# Patient Record
Sex: Female | Born: 1955 | Race: Black or African American | Hispanic: No | Smoking: Never smoker
Health system: Southern US, Community
[De-identification: ages and names within clinical notes are randomized; demographics above are authoritative.]

## PROBLEM LIST (undated history)

## (undated) DIAGNOSIS — E785 Hyperlipidemia, unspecified: Secondary | ICD-10-CM

## (undated) DIAGNOSIS — F32A Depression, unspecified: Secondary | ICD-10-CM

## (undated) DIAGNOSIS — R079 Chest pain, unspecified: Secondary | ICD-10-CM

## (undated) DIAGNOSIS — M1990 Unspecified osteoarthritis, unspecified site: Secondary | ICD-10-CM

## (undated) DIAGNOSIS — R011 Cardiac murmur, unspecified: Secondary | ICD-10-CM

## (undated) DIAGNOSIS — I10 Essential (primary) hypertension: Secondary | ICD-10-CM

## (undated) DIAGNOSIS — F419 Anxiety disorder, unspecified: Secondary | ICD-10-CM

## (undated) DIAGNOSIS — G5603 Carpal tunnel syndrome, bilateral upper limbs: Secondary | ICD-10-CM

## (undated) DIAGNOSIS — T7840XA Allergy, unspecified, initial encounter: Secondary | ICD-10-CM

## (undated) DIAGNOSIS — M8580 Other specified disorders of bone density and structure, unspecified site: Secondary | ICD-10-CM

## (undated) DIAGNOSIS — IMO0001 Reserved for inherently not codable concepts without codable children: Secondary | ICD-10-CM

## (undated) DIAGNOSIS — J45909 Unspecified asthma, uncomplicated: Secondary | ICD-10-CM

## (undated) HISTORY — PX: POLYPECTOMY: SHX149

## (undated) HISTORY — DX: Depression, unspecified: F32.A

## (undated) HISTORY — DX: Reserved for inherently not codable concepts without codable children: IMO0001

## (undated) HISTORY — DX: Essential (primary) hypertension: I10

## (undated) HISTORY — DX: Hyperlipidemia, unspecified: E78.5

## (undated) HISTORY — DX: Cardiac murmur, unspecified: R01.1

## (undated) HISTORY — DX: Allergy, unspecified, initial encounter: T78.40XA

## (undated) HISTORY — PX: COLONOSCOPY: SHX174

## (undated) HISTORY — DX: Chest pain, unspecified: R07.9

## (undated) HISTORY — DX: Other specified disorders of bone density and structure, unspecified site: M85.80

## (undated) NOTE — *Deleted (*Deleted)
It was great to see you! Thank you for allowing me to participate in your care!  Our plans for today:  -Today we discussed your dizziness and headache. *** -   We are checking some labs today, I will call you if they are abnormal will send you a MyChart message or a letter if they are normal.  If you do not hear about your labs in the next 2 weeks please let us know.***  Take care and seek immediate care sooner if you develop any concerns.   Dr. Jackelyn Poling, DO [facility]

---

## 1999-06-29 ENCOUNTER — Other Ambulatory Visit: Admission: RE | Admit: 1999-06-29 | Discharge: 1999-06-29 | Payer: Self-pay | Admitting: Family Medicine

## 2000-07-13 ENCOUNTER — Other Ambulatory Visit: Admission: RE | Admit: 2000-07-13 | Discharge: 2000-07-13 | Payer: Self-pay | Admitting: Family Medicine

## 2000-10-28 ENCOUNTER — Emergency Department (HOSPITAL_COMMUNITY): Admission: EM | Admit: 2000-10-28 | Discharge: 2000-10-28 | Payer: Self-pay | Admitting: Emergency Medicine

## 2002-01-29 ENCOUNTER — Encounter: Payer: Self-pay | Admitting: Emergency Medicine

## 2002-01-29 ENCOUNTER — Emergency Department (HOSPITAL_COMMUNITY): Admission: EM | Admit: 2002-01-29 | Discharge: 2002-01-29 | Payer: Self-pay | Admitting: Emergency Medicine

## 2003-05-08 ENCOUNTER — Other Ambulatory Visit: Admission: RE | Admit: 2003-05-08 | Discharge: 2003-05-08 | Payer: Self-pay | Admitting: *Deleted

## 2003-05-08 ENCOUNTER — Other Ambulatory Visit: Admission: RE | Admit: 2003-05-08 | Discharge: 2003-05-08 | Payer: Self-pay | Admitting: Obstetrics and Gynecology

## 2003-09-29 ENCOUNTER — Emergency Department (HOSPITAL_COMMUNITY): Admission: AD | Admit: 2003-09-29 | Discharge: 2003-09-29 | Payer: Self-pay | Admitting: Family Medicine

## 2003-11-03 ENCOUNTER — Encounter: Admission: RE | Admit: 2003-11-03 | Discharge: 2003-11-03 | Payer: Self-pay | Admitting: Family Medicine

## 2003-11-04 ENCOUNTER — Encounter: Admission: RE | Admit: 2003-11-04 | Discharge: 2003-11-04 | Payer: Self-pay | Admitting: Family Medicine

## 2003-12-25 ENCOUNTER — Encounter: Admission: RE | Admit: 2003-12-25 | Discharge: 2003-12-25 | Payer: Self-pay | Admitting: Family Medicine

## 2004-01-07 ENCOUNTER — Encounter: Admission: RE | Admit: 2004-01-07 | Discharge: 2004-01-07 | Payer: Self-pay | Admitting: Sports Medicine

## 2004-03-30 ENCOUNTER — Encounter: Admission: RE | Admit: 2004-03-30 | Discharge: 2004-03-30 | Payer: Self-pay | Admitting: Family Medicine

## 2004-06-23 ENCOUNTER — Emergency Department (HOSPITAL_COMMUNITY): Admission: EM | Admit: 2004-06-23 | Discharge: 2004-06-23 | Payer: Self-pay | Admitting: Family Medicine

## 2004-07-22 ENCOUNTER — Ambulatory Visit: Payer: Self-pay | Admitting: Family Medicine

## 2004-08-01 ENCOUNTER — Ambulatory Visit: Payer: Self-pay | Admitting: Sports Medicine

## 2004-12-09 ENCOUNTER — Emergency Department (HOSPITAL_COMMUNITY): Admission: EM | Admit: 2004-12-09 | Discharge: 2004-12-09 | Payer: Self-pay | Admitting: Emergency Medicine

## 2004-12-12 ENCOUNTER — Ambulatory Visit: Payer: Self-pay | Admitting: Sports Medicine

## 2005-01-12 ENCOUNTER — Ambulatory Visit: Payer: Self-pay | Admitting: Family Medicine

## 2005-02-28 ENCOUNTER — Ambulatory Visit: Payer: Self-pay | Admitting: Family Medicine

## 2005-03-09 ENCOUNTER — Encounter: Admission: RE | Admit: 2005-03-09 | Discharge: 2005-03-09 | Payer: Self-pay | Admitting: Sports Medicine

## 2005-06-13 ENCOUNTER — Ambulatory Visit: Payer: Self-pay | Admitting: Sports Medicine

## 2005-06-14 ENCOUNTER — Ambulatory Visit: Payer: Self-pay | Admitting: Family Medicine

## 2005-08-22 ENCOUNTER — Ambulatory Visit: Payer: Self-pay | Admitting: Family Medicine

## 2006-04-02 ENCOUNTER — Ambulatory Visit: Payer: Self-pay | Admitting: Family Medicine

## 2006-04-02 ENCOUNTER — Ambulatory Visit (HOSPITAL_COMMUNITY): Admission: RE | Admit: 2006-04-02 | Discharge: 2006-04-02 | Payer: Self-pay | Admitting: Family Medicine

## 2006-04-09 ENCOUNTER — Ambulatory Visit (HOSPITAL_COMMUNITY): Admission: RE | Admit: 2006-04-09 | Discharge: 2006-04-09 | Payer: Self-pay | Admitting: Cardiovascular Disease

## 2006-04-20 ENCOUNTER — Ambulatory Visit: Payer: Self-pay | Admitting: Family Medicine

## 2006-05-03 ENCOUNTER — Ambulatory Visit: Payer: Self-pay | Admitting: Family Medicine

## 2006-05-04 ENCOUNTER — Ambulatory Visit: Payer: Self-pay | Admitting: Family Medicine

## 2006-05-07 ENCOUNTER — Ambulatory Visit: Payer: Self-pay | Admitting: Family Medicine

## 2006-06-07 ENCOUNTER — Ambulatory Visit (HOSPITAL_COMMUNITY): Admission: RE | Admit: 2006-06-07 | Discharge: 2006-06-07 | Payer: Self-pay | Admitting: Family Medicine

## 2006-08-16 ENCOUNTER — Encounter (INDEPENDENT_AMBULATORY_CARE_PROVIDER_SITE_OTHER): Payer: Self-pay | Admitting: *Deleted

## 2006-08-21 ENCOUNTER — Ambulatory Visit: Payer: Self-pay | Admitting: Family Medicine

## 2006-09-25 ENCOUNTER — Emergency Department (HOSPITAL_COMMUNITY): Admission: EM | Admit: 2006-09-25 | Discharge: 2006-09-25 | Payer: Self-pay | Admitting: Family Medicine

## 2006-10-31 ENCOUNTER — Encounter (INDEPENDENT_AMBULATORY_CARE_PROVIDER_SITE_OTHER): Payer: Self-pay | Admitting: Family Medicine

## 2006-10-31 ENCOUNTER — Ambulatory Visit: Payer: Self-pay | Admitting: Sports Medicine

## 2006-10-31 LAB — CONVERTED CEMR LAB
CO2: 29 meq/L (ref 19–32)
Calcium: 9.9 mg/dL (ref 8.4–10.5)
Creatinine, Ser: 0.84 mg/dL (ref 0.40–1.20)
Glucose, Bld: 93 mg/dL (ref 70–99)

## 2006-12-13 DIAGNOSIS — E669 Obesity, unspecified: Secondary | ICD-10-CM

## 2006-12-14 ENCOUNTER — Encounter (INDEPENDENT_AMBULATORY_CARE_PROVIDER_SITE_OTHER): Payer: Self-pay | Admitting: *Deleted

## 2007-01-10 ENCOUNTER — Emergency Department (HOSPITAL_COMMUNITY): Admission: EM | Admit: 2007-01-10 | Discharge: 2007-01-10 | Payer: Self-pay | Admitting: Family Medicine

## 2007-01-15 ENCOUNTER — Ambulatory Visit: Payer: Self-pay | Admitting: Family Medicine

## 2007-01-15 ENCOUNTER — Encounter (INDEPENDENT_AMBULATORY_CARE_PROVIDER_SITE_OTHER): Payer: Self-pay | Admitting: Family Medicine

## 2007-01-15 DIAGNOSIS — I10 Essential (primary) hypertension: Secondary | ICD-10-CM

## 2007-04-12 ENCOUNTER — Telehealth (INDEPENDENT_AMBULATORY_CARE_PROVIDER_SITE_OTHER): Payer: Self-pay | Admitting: Family Medicine

## 2007-04-15 ENCOUNTER — Telehealth: Payer: Self-pay | Admitting: *Deleted

## 2007-04-18 ENCOUNTER — Encounter (INDEPENDENT_AMBULATORY_CARE_PROVIDER_SITE_OTHER): Payer: Self-pay | Admitting: Family Medicine

## 2007-04-18 ENCOUNTER — Ambulatory Visit: Payer: Self-pay | Admitting: Family Medicine

## 2007-04-18 LAB — CONVERTED CEMR LAB
Chlamydia, DNA Probe: NEGATIVE
GC Probe Amp, Genital: NEGATIVE
Whiff Test: NEGATIVE

## 2007-05-08 ENCOUNTER — Ambulatory Visit: Payer: Self-pay | Admitting: Family Medicine

## 2007-05-08 DIAGNOSIS — E0590 Thyrotoxicosis, unspecified without thyrotoxic crisis or storm: Secondary | ICD-10-CM | POA: Insufficient documentation

## 2007-05-08 HISTORY — DX: Thyrotoxicosis, unspecified without thyrotoxic crisis or storm: E05.90

## 2007-05-20 ENCOUNTER — Ambulatory Visit: Payer: Self-pay | Admitting: Sports Medicine

## 2007-06-11 ENCOUNTER — Ambulatory Visit: Payer: Self-pay | Admitting: Family Medicine

## 2007-07-02 ENCOUNTER — Ambulatory Visit: Payer: Self-pay | Admitting: Family Medicine

## 2007-07-04 ENCOUNTER — Telehealth: Payer: Self-pay | Admitting: *Deleted

## 2007-07-23 ENCOUNTER — Ambulatory Visit: Payer: Self-pay | Admitting: Family Medicine

## 2007-08-16 ENCOUNTER — Telehealth (INDEPENDENT_AMBULATORY_CARE_PROVIDER_SITE_OTHER): Payer: Self-pay | Admitting: Family Medicine

## 2007-08-23 ENCOUNTER — Encounter (INDEPENDENT_AMBULATORY_CARE_PROVIDER_SITE_OTHER): Payer: Self-pay | Admitting: Family Medicine

## 2007-08-23 ENCOUNTER — Ambulatory Visit: Payer: Self-pay | Admitting: Family Medicine

## 2007-08-23 LAB — CONVERTED CEMR LAB
Chloride: 106 meq/L (ref 96–112)
Creatinine, Ser: 0.9 mg/dL (ref 0.40–1.20)
Free T4: 1.06 ng/dL (ref 0.89–1.80)
Potassium: 4 meq/L (ref 3.5–5.3)
T3, Free: 2.6 pg/mL (ref 2.3–4.2)
TSH: 0.241 microintl units/mL — ABNORMAL LOW (ref 0.350–5.50)

## 2007-08-26 ENCOUNTER — Telehealth: Payer: Self-pay | Admitting: *Deleted

## 2007-08-27 ENCOUNTER — Encounter (INDEPENDENT_AMBULATORY_CARE_PROVIDER_SITE_OTHER): Payer: Self-pay | Admitting: Family Medicine

## 2007-08-29 ENCOUNTER — Telehealth (INDEPENDENT_AMBULATORY_CARE_PROVIDER_SITE_OTHER): Payer: Self-pay | Admitting: Family Medicine

## 2007-08-30 ENCOUNTER — Encounter (INDEPENDENT_AMBULATORY_CARE_PROVIDER_SITE_OTHER): Payer: Self-pay | Admitting: Family Medicine

## 2007-08-30 ENCOUNTER — Telehealth (INDEPENDENT_AMBULATORY_CARE_PROVIDER_SITE_OTHER): Payer: Self-pay | Admitting: Family Medicine

## 2007-09-02 ENCOUNTER — Telehealth (INDEPENDENT_AMBULATORY_CARE_PROVIDER_SITE_OTHER): Payer: Self-pay | Admitting: Family Medicine

## 2007-10-09 ENCOUNTER — Ambulatory Visit (HOSPITAL_COMMUNITY): Admission: RE | Admit: 2007-10-09 | Discharge: 2007-10-09 | Payer: Self-pay | Admitting: Family Medicine

## 2007-10-31 ENCOUNTER — Encounter: Admission: RE | Admit: 2007-10-31 | Discharge: 2007-10-31 | Payer: Self-pay | Admitting: Family Medicine

## 2007-11-05 ENCOUNTER — Telehealth (INDEPENDENT_AMBULATORY_CARE_PROVIDER_SITE_OTHER): Payer: Self-pay | Admitting: Family Medicine

## 2007-12-06 ENCOUNTER — Encounter (INDEPENDENT_AMBULATORY_CARE_PROVIDER_SITE_OTHER): Payer: Self-pay | Admitting: Family Medicine

## 2007-12-06 ENCOUNTER — Ambulatory Visit: Payer: Self-pay | Admitting: Family Medicine

## 2007-12-06 LAB — CONVERTED CEMR LAB: Whiff Test: POSITIVE

## 2007-12-09 LAB — CONVERTED CEMR LAB

## 2007-12-13 ENCOUNTER — Encounter (INDEPENDENT_AMBULATORY_CARE_PROVIDER_SITE_OTHER): Payer: Self-pay | Admitting: *Deleted

## 2008-05-14 ENCOUNTER — Encounter (INDEPENDENT_AMBULATORY_CARE_PROVIDER_SITE_OTHER): Payer: Self-pay | Admitting: Family Medicine

## 2008-05-14 ENCOUNTER — Telehealth: Payer: Self-pay | Admitting: *Deleted

## 2008-05-14 ENCOUNTER — Ambulatory Visit: Payer: Self-pay | Admitting: Family Medicine

## 2008-05-14 LAB — CONVERTED CEMR LAB
ALT: 12 units/L (ref 0–35)
Free T4: 1 ng/dL (ref 0.89–1.80)
Indirect Bilirubin: 0.3 mg/dL (ref 0.0–0.9)
LDL Cholesterol: 146 mg/dL — ABNORMAL HIGH (ref 0–99)
TSH: 0.225 microintl units/mL — ABNORMAL LOW (ref 0.350–4.50)
Total CHOL/HDL Ratio: 3.7
Total Protein: 7.2 g/dL (ref 6.0–8.3)
Triglycerides: 72 mg/dL (ref <150)
VLDL: 14 mg/dL (ref 0–40)

## 2008-05-26 ENCOUNTER — Encounter (INDEPENDENT_AMBULATORY_CARE_PROVIDER_SITE_OTHER): Payer: Self-pay | Admitting: Family Medicine

## 2008-11-09 ENCOUNTER — Ambulatory Visit: Payer: Self-pay | Admitting: Family Medicine

## 2008-11-09 ENCOUNTER — Encounter (INDEPENDENT_AMBULATORY_CARE_PROVIDER_SITE_OTHER): Payer: Self-pay | Admitting: Family Medicine

## 2008-11-09 DIAGNOSIS — E785 Hyperlipidemia, unspecified: Secondary | ICD-10-CM

## 2008-11-09 LAB — CONVERTED CEMR LAB
ALT: 14 units/L (ref 0–35)
AST: 17 units/L (ref 0–37)
Creatinine, Ser: 0.81 mg/dL (ref 0.40–1.20)
T3, Free: 3 pg/mL (ref 2.3–4.2)
Total Bilirubin: 0.3 mg/dL (ref 0.3–1.2)
Whiff Test: NEGATIVE

## 2008-11-13 ENCOUNTER — Telehealth: Payer: Self-pay | Admitting: *Deleted

## 2008-11-25 ENCOUNTER — Ambulatory Visit: Payer: Self-pay | Admitting: Gastroenterology

## 2008-11-25 ENCOUNTER — Telehealth: Payer: Self-pay | Admitting: *Deleted

## 2008-11-26 ENCOUNTER — Encounter (INDEPENDENT_AMBULATORY_CARE_PROVIDER_SITE_OTHER): Payer: Self-pay | Admitting: Family Medicine

## 2008-12-04 ENCOUNTER — Ambulatory Visit (HOSPITAL_COMMUNITY): Admission: RE | Admit: 2008-12-04 | Discharge: 2008-12-04 | Payer: Self-pay | Admitting: Family Medicine

## 2008-12-04 ENCOUNTER — Ambulatory Visit: Payer: Self-pay | Admitting: Family Medicine

## 2008-12-08 ENCOUNTER — Encounter: Payer: Self-pay | Admitting: Gastroenterology

## 2008-12-08 ENCOUNTER — Ambulatory Visit: Payer: Self-pay | Admitting: Gastroenterology

## 2008-12-10 ENCOUNTER — Encounter: Payer: Self-pay | Admitting: Gastroenterology

## 2008-12-25 ENCOUNTER — Ambulatory Visit: Payer: Self-pay | Admitting: Family Medicine

## 2008-12-28 ENCOUNTER — Ambulatory Visit: Payer: Self-pay | Admitting: Family Medicine

## 2009-01-04 ENCOUNTER — Ambulatory Visit: Payer: Self-pay | Admitting: Family Medicine

## 2009-01-04 ENCOUNTER — Encounter (INDEPENDENT_AMBULATORY_CARE_PROVIDER_SITE_OTHER): Payer: Self-pay | Admitting: Family Medicine

## 2009-01-04 DIAGNOSIS — G5600 Carpal tunnel syndrome, unspecified upper limb: Secondary | ICD-10-CM

## 2009-01-06 ENCOUNTER — Encounter (INDEPENDENT_AMBULATORY_CARE_PROVIDER_SITE_OTHER): Payer: Self-pay | Admitting: Family Medicine

## 2009-02-17 ENCOUNTER — Ambulatory Visit: Payer: Self-pay | Admitting: Family Medicine

## 2009-02-17 DIAGNOSIS — M25519 Pain in unspecified shoulder: Secondary | ICD-10-CM

## 2009-04-05 ENCOUNTER — Telehealth (INDEPENDENT_AMBULATORY_CARE_PROVIDER_SITE_OTHER): Payer: Self-pay | Admitting: Family Medicine

## 2009-04-06 ENCOUNTER — Ambulatory Visit: Payer: Self-pay | Admitting: Family Medicine

## 2009-04-06 ENCOUNTER — Encounter (INDEPENDENT_AMBULATORY_CARE_PROVIDER_SITE_OTHER): Payer: Self-pay | Admitting: Family Medicine

## 2009-04-07 ENCOUNTER — Telehealth: Payer: Self-pay | Admitting: *Deleted

## 2009-04-07 LAB — CONVERTED CEMR LAB
Direct LDL: 129 mg/dL — ABNORMAL HIGH
T3, Free: 2.9 pg/mL (ref 2.3–4.2)
TSH: 0.298 microintl units/mL — ABNORMAL LOW (ref 0.350–4.500)

## 2009-07-09 ENCOUNTER — Emergency Department (HOSPITAL_COMMUNITY): Admission: EM | Admit: 2009-07-09 | Discharge: 2009-07-09 | Payer: Self-pay | Admitting: Emergency Medicine

## 2009-07-12 ENCOUNTER — Encounter: Payer: Self-pay | Admitting: Family Medicine

## 2009-07-12 ENCOUNTER — Ambulatory Visit (HOSPITAL_COMMUNITY): Admission: RE | Admit: 2009-07-12 | Discharge: 2009-07-12 | Payer: Self-pay | Admitting: Family Medicine

## 2009-07-12 ENCOUNTER — Ambulatory Visit: Payer: Self-pay | Admitting: Family Medicine

## 2009-07-13 ENCOUNTER — Ambulatory Visit: Payer: Self-pay | Admitting: Family Medicine

## 2009-07-13 ENCOUNTER — Encounter (INDEPENDENT_AMBULATORY_CARE_PROVIDER_SITE_OTHER): Payer: Self-pay | Admitting: *Deleted

## 2009-07-13 ENCOUNTER — Encounter: Payer: Self-pay | Admitting: Family Medicine

## 2009-07-13 LAB — CONVERTED CEMR LAB
Chloride: 106 meq/L (ref 96–112)
Creatinine, Ser: 0.78 mg/dL (ref 0.40–1.20)
FSH: 12.5 milliintl units/mL
Free T4: 0.85 ng/dL (ref 0.80–1.80)
HCT: 38.3 % (ref 36.0–46.0)
Platelets: 295 10*3/uL (ref 150–400)
Potassium: 4 meq/L (ref 3.5–5.3)
RDW: 14 % (ref 11.5–15.5)

## 2009-07-16 ENCOUNTER — Encounter: Payer: Self-pay | Admitting: Family Medicine

## 2009-07-19 ENCOUNTER — Encounter (INDEPENDENT_AMBULATORY_CARE_PROVIDER_SITE_OTHER): Payer: Self-pay | Admitting: *Deleted

## 2009-09-16 ENCOUNTER — Telehealth (INDEPENDENT_AMBULATORY_CARE_PROVIDER_SITE_OTHER): Payer: Self-pay | Admitting: *Deleted

## 2009-09-16 ENCOUNTER — Ambulatory Visit: Payer: Self-pay | Admitting: Family Medicine

## 2009-11-30 ENCOUNTER — Ambulatory Visit: Payer: Self-pay | Admitting: Family Medicine

## 2009-11-30 LAB — CONVERTED CEMR LAB

## 2009-12-07 ENCOUNTER — Ambulatory Visit (HOSPITAL_COMMUNITY): Admission: RE | Admit: 2009-12-07 | Discharge: 2009-12-07 | Payer: Self-pay | Admitting: Family Medicine

## 2009-12-15 ENCOUNTER — Ambulatory Visit: Payer: Self-pay | Admitting: Family Medicine

## 2009-12-15 DIAGNOSIS — J301 Allergic rhinitis due to pollen: Secondary | ICD-10-CM | POA: Insufficient documentation

## 2009-12-22 ENCOUNTER — Telehealth: Payer: Self-pay | Admitting: Family Medicine

## 2009-12-28 ENCOUNTER — Encounter: Payer: Self-pay | Admitting: Family Medicine

## 2009-12-29 ENCOUNTER — Telehealth (INDEPENDENT_AMBULATORY_CARE_PROVIDER_SITE_OTHER): Payer: Self-pay | Admitting: Family Medicine

## 2009-12-30 ENCOUNTER — Encounter: Payer: Self-pay | Admitting: Family Medicine

## 2009-12-30 ENCOUNTER — Ambulatory Visit: Payer: Self-pay | Admitting: Family Medicine

## 2009-12-30 DIAGNOSIS — E049 Nontoxic goiter, unspecified: Secondary | ICD-10-CM | POA: Insufficient documentation

## 2009-12-31 ENCOUNTER — Telehealth: Payer: Self-pay | Admitting: Family Medicine

## 2009-12-31 LAB — CONVERTED CEMR LAB: Thyroperoxidase Ab SerPl-aCnc: 89.4 — ABNORMAL HIGH (ref 0.0–60.0)

## 2010-01-04 ENCOUNTER — Ambulatory Visit: Payer: Self-pay | Admitting: Family Medicine

## 2010-01-04 DIAGNOSIS — F39 Unspecified mood [affective] disorder: Secondary | ICD-10-CM | POA: Insufficient documentation

## 2010-01-17 ENCOUNTER — Telehealth (INDEPENDENT_AMBULATORY_CARE_PROVIDER_SITE_OTHER): Payer: Self-pay | Admitting: *Deleted

## 2010-01-25 ENCOUNTER — Ambulatory Visit: Payer: Self-pay | Admitting: Family Medicine

## 2010-01-28 ENCOUNTER — Ambulatory Visit: Payer: Self-pay | Admitting: Family Medicine

## 2010-02-11 ENCOUNTER — Encounter: Payer: Self-pay | Admitting: Family Medicine

## 2010-05-12 ENCOUNTER — Ambulatory Visit: Payer: Self-pay | Admitting: Family Medicine

## 2010-05-12 ENCOUNTER — Telehealth: Payer: Self-pay | Admitting: Family Medicine

## 2010-06-02 ENCOUNTER — Ambulatory Visit: Payer: Self-pay | Admitting: Family Medicine

## 2010-06-06 ENCOUNTER — Telehealth: Payer: Self-pay | Admitting: Family Medicine

## 2010-06-06 ENCOUNTER — Ambulatory Visit: Payer: Self-pay | Admitting: Family Medicine

## 2010-06-06 LAB — CONVERTED CEMR LAB: Rapid Strep: NEGATIVE

## 2010-09-19 ENCOUNTER — Encounter: Payer: Self-pay | Admitting: Family Medicine

## 2010-09-19 ENCOUNTER — Ambulatory Visit: Payer: Self-pay | Admitting: Family Medicine

## 2010-09-19 LAB — CONVERTED CEMR LAB: Whiff Test: NEGATIVE

## 2010-09-20 ENCOUNTER — Telehealth: Payer: Self-pay | Admitting: *Deleted

## 2010-09-23 ENCOUNTER — Ambulatory Visit: Payer: Self-pay | Admitting: Family Medicine

## 2010-09-23 ENCOUNTER — Encounter: Payer: Self-pay | Admitting: Family Medicine

## 2010-09-26 ENCOUNTER — Telehealth: Payer: Self-pay | Admitting: *Deleted

## 2010-09-26 ENCOUNTER — Encounter: Payer: Self-pay | Admitting: Family Medicine

## 2010-09-26 LAB — CONVERTED CEMR LAB
BUN: 11 mg/dL (ref 6–23)
CO2: 28 meq/L (ref 19–32)
Chloride: 105 meq/L (ref 96–112)
Creatinine, Ser: 0.78 mg/dL (ref 0.40–1.20)
Free T4: 0.92 ng/dL (ref 0.80–1.80)
Glucose, Bld: 101 mg/dL — ABNORMAL HIGH (ref 70–99)
LDL Cholesterol: 143 mg/dL — ABNORMAL HIGH (ref 0–99)
T3, Free: 3.1 pg/mL (ref 2.3–4.2)
TSH: 0.471 microintl units/mL (ref 0.350–4.500)
Triglycerides: 83 mg/dL (ref <150)

## 2010-09-29 ENCOUNTER — Telehealth: Payer: Self-pay | Admitting: Family Medicine

## 2010-11-01 ENCOUNTER — Encounter: Payer: Self-pay | Admitting: *Deleted

## 2010-11-01 ENCOUNTER — Ambulatory Visit
Admission: RE | Admit: 2010-11-01 | Discharge: 2010-11-01 | Payer: Self-pay | Source: Home / Self Care | Attending: Family Medicine | Admitting: Family Medicine

## 2010-11-01 DIAGNOSIS — M7660 Achilles tendinitis, unspecified leg: Secondary | ICD-10-CM | POA: Insufficient documentation

## 2010-11-01 DIAGNOSIS — N644 Mastodynia: Secondary | ICD-10-CM | POA: Insufficient documentation

## 2010-11-01 DIAGNOSIS — N3000 Acute cystitis without hematuria: Secondary | ICD-10-CM | POA: Insufficient documentation

## 2010-11-01 LAB — CONVERTED CEMR LAB
Glucose, Urine, Semiquant: NEGATIVE
Nitrite: NEGATIVE
Specific Gravity, Urine: 1.02
pH: 6

## 2010-11-06 ENCOUNTER — Encounter: Payer: Self-pay | Admitting: Family Medicine

## 2010-11-16 ENCOUNTER — Encounter: Payer: Self-pay | Admitting: Family Medicine

## 2010-11-17 NOTE — Miscellaneous (Signed)
Summary: mammogram  Clinical Lists Changes sent mammogram scholarship and order for screening mammogram through inter office mail to [facility] hospital. changed from diagnostic per Dr Jeanice Lim b/c pt has no insurance and cannot afford to pay.

## 2010-11-17 NOTE — Assessment & Plan Note (Signed)
Summary: sore throat/earache,tcb   Vital Signs:  Patient profile:   55 year old female Height:      62 inches Weight:      227.6 pounds BMI:     41.78 Temp:     98.5 degrees F oral Pulse rate:   114 / minute BP sitting:   138 / 91  (left arm) Cuff size:   large  Vitals Entered By: Garen Grams LPN (June 02, 2010 10:54 AM) CC: sore throat, ear ache, cough/congestion, chills Is Patient Diabetic? No   Primary Care [facility]:  Milinda Antis MD  CC:  sore throat, ear ache, cough/congestion, and chills.  History of Present Illness: symptoms started 4 days ago with sore throat, congestion, ear ache, 2 days of chills and sweats and myalgias.  has used albuterol a few times since start of symptoms but last use 2 days ago.  Habits & Providers  Alcohol-Tobacco-Diet     Tobacco Status: never  Current Medications (verified): 1)  Bayer Childrens Aspirin 81 Mg Chew (Aspirin) .... Take 1 Tablet By Mouth Once A Day 2)  Hydrochlorothiazide 25 Mg Tabs (Hydrochlorothiazide) .... Take 1 Tablet By Mouth Once A Day 3)  Metoprolol Tartrate 50 Mg Tabs (Metoprolol Tartrate) .... Take 1 Tablet By Mouth Twice A Day 4)  Loratadine 10 Mg Tabs (Loratadine) .... Take One Tablet Daily As Needed For Allergies 5)  Proventil Hfa 108 (90 Base) Mcg/act Aers (Albuterol Sulfate) .... Inhale 1-2 Puff Every 4-6 Hours As Needed Shortness of Breath If This Is Not Covered Under Insurance For Albuterol Please Provide What Is 6)  Ibuprofen 800 Mg Tabs (Ibuprofen) .Marland Kitchen.. 1 By Mouth Three Times A Day As Needed Pain 7)  Flexeril 10 Mg Tabs (Cyclobenzaprine Hcl) .Marland Kitchen.. 1 By Mouth  Two Times A Day 8)  Zoloft 25 Mg Tabs (Sertraline Hcl) .Marland Kitchen.. 1 By Mouth Daily 9)  Triamcinolone Acetonide 0.1 % Crea With Eucerin 1:1 .... Apply Two Times A Day To Rash For 10 Days.  Allergies (verified): No Known Drug Allergies  Review of Systems  The patient denies anorexia, chest pain, and abdominal pain.    Physical Exam  General:   Well-developed,well-nourished minorly uncomfortable; alert,appropriate and cooperative throughout examination Head:  Normocephalic and atraumatic without obvious abnormalities. No apparent alopecia or balding. Eyes:  No corneal or conjunctival inflammation noted. EOMI. Perrla. Vision grossly normal. Ears:  some fluid noted bilaterally behind TM.  no injection Nose:  no external erythema and nasal discharge mucosal pallor.   Neck:  supple and full ROM.  no cervical lymphadenopathy Lungs:  Normal respiratory effort, chest expands symmetrically. Lungs are clear to auscultation, no crackles or wheezes. Heart:  Normal rate and regular rhythm. S1 and S2 normal without gallop, murmur, click, rub or other extra sounds. Abdomen:  Bowel sounds positive,abdomen soft and non-tender without masses, organomegaly or hernias noted.   Impression & Recommendations:  Problem # 1:  VIRAL URI (ICD-465.9) Assessment New think this is a URI at this point, no wheezes to suggest that she is having asthma component, no changes to lung exam to suggest pneumonia.  too early for flu season but myalgias could be a possibility.  supportive care for now. Her updated medication list for this problem includes:    Bayer Childrens Aspirin 81 Mg Chew (Aspirin) .Marland Kitchen... Take 1 tablet by mouth once a day    Loratadine 10 Mg Tabs (Loratadine) .Marland Kitchen... Take one tablet daily as needed for allergies    Ibuprofen 800 Mg Tabs (Ibuprofen) .Marland KitchenMarland KitchenMarland KitchenMarland Kitchen  1 by mouth three times a day as needed pain  Orders: [facility]- Est Level  3 (48546)  Complete Medication List: 1)  Bayer Childrens Aspirin 81 Mg Chew (Aspirin) .... Take 1 tablet by mouth once a day 2)  Hydrochlorothiazide 25 Mg Tabs (Hydrochlorothiazide) .... Take 1 tablet by mouth once a day 3)  Metoprolol Tartrate 50 Mg Tabs (Metoprolol tartrate) .... Take 1 tablet by mouth twice a day 4)  Loratadine 10 Mg Tabs (Loratadine) .... Take one tablet daily as needed for allergies 5)  Proventil Hfa 108 (90  Base) Mcg/act Aers (Albuterol sulfate) .... Inhale 1-2 puff every 4-6 hours as needed shortness of breath if this is not covered under insurance for albuterol please provide what is 6)  Ibuprofen 800 Mg Tabs (Ibuprofen) .Marland Kitchen.. 1 by mouth three times a day as needed pain 7)  Flexeril 10 Mg Tabs (Cyclobenzaprine hcl) .Marland Kitchen.. 1 by mouth  two times a day 8)  Zoloft 25 Mg Tabs (Sertraline hcl) .Marland Kitchen.. 1 by mouth daily 9)  Triamcinolone Acetonide 0.1 % Crea With Eucerin 1:1  .... Apply two times a day to rash for 10 days.  Patient Instructions: 1)  It was nice to meet you today. I hope you feel better soon. 2)  Get plenty of rest, drink lots of clear liquids, and use Tylenol or Ibuprofen for fever and comfort. Return in 7-10 days if you're not better: sooner if you'er feeling worse.  3)  also, try the pseudophedrine (behind the counter sudafed) to see if it helps clear some of the congestion.

## 2010-11-17 NOTE — Progress Notes (Signed)
Summary: re: labs/ts  ---- Converted from flag ---- ---- 09/20/2010 2:11 PM, Milinda Antis MD wrote: please let pt know her labs were negative ------------------------------  called pt and lmvm to call us back.Arlyss Repress CMA,  September 20, 2010 5:35 PM       Additional Follow-up for Phone Call Additional follow up Details #2::    spoke wit pt, informed of lab results Follow-up by: Tessie Fass CMA,  September 21, 2010 8:44 AM

## 2010-11-17 NOTE — Assessment & Plan Note (Signed)
Summary: discuss ssri/[place]   Vital Signs:  Patient profile:   55 year old female Height:      61.5 inches Weight:      231 pounds BMI:     43.10 Temp:     97.8 degrees F oral Pulse rate:   81 / minute BP sitting:   119 / 73  (left arm) Cuff size:   large  Vitals Entered By: Tessie Fass CMA (January 04, 2010 3:56 PM) CC: Discuss Mood Is Patient Diabetic? No Pain Assessment Patient in pain? no        Primary Care Provider:  Milinda Antis MD  CC:  Discuss Mood.  History of Present Illness:   Pt presents because she asked for Zoloft to help balance her.  Pt was seen less than 1 month ago and was doing well, no concerns given about her mental balance. Today she opens up again as previous states she has her "good days" and bad days. Pt never used the term depression or anxiety, uses stress or not feeling right.  admits to not feeling herself on and off for months, worse since she left her church 6 months ago because she got tired of "the people". Her daughter notices that she is not very happy and not a people person anymore.  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   Habits & Providers  Alcohol-Tobacco-Diet     Tobacco Status: never  Allergies: No Known Drug Allergies  Family History: Breast CA - paternal grandmother, Diabetes- father, Thyroid disease (goiter) - mother, Brother - mentally challenged, Son and brother had rickets Father had some kind of stomach cancer, bowel????  Grandmother - CVAs, Mom CVA Mother/SIster- depression  Social History: lives with 2 children, daycare employee, student (Masters in [place]), was working as a Orthoptist previously, non-smoker, non-alcoholic drinker. Husband now working in [place]. They are talking now but still separated. . Daughter goes to [employer]. Son about to go to school.   Physical Exam  General:  Well-developed,obese ,in no acute distress; alert,appropriate and cooperative throughout examination Vital signs noted  Psych:  Cognition and judgment appear intact. Alert and cooperative with normal attention span and concentration. 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 Pt is not in acute harm and no red flags today. Will give trial of SSRI again, but instructed on proper use, I wonder how much is just placbo because I dont she was getting any of the medication by breaking it into such small pieces, but pt is very adament that it kept her balanced. Noted family history of depression Orders: [facility]- Est Level  3 (16109)  Problem # 2:  HYPERTHYROIDISM, SUBCLINICAL (ICD-242.90) Assessment: Unchanged Pt has subclinical hyperthryodism but would not start iodine therapy at this time. This may or may not contribute to mood Her  updated medication list for this problem includes:    Metoprolol Tartrate 50 Mg Tabs (Metoprolol tartrate) .Marland Kitchen... Take 1 tablet by mouth twice a day  Complete Medication List: 1)  Bayer Childrens Aspirin 81 Mg Chew (Aspirin) .... Take 1 tablet by mouth once a day 2)  Hydrochlorothiazide 25 Mg Tabs (Hydrochlorothiazide) .... Take 1 tablet by mouth once a day 3)  Metoprolol Tartrate 50 Mg Tabs (Metoprolol tartrate) .... Take 1 tablet by mouth twice a day 4)  Loratadine 10 Mg Tabs (Loratadine) .... Take one tablet daily as needed for allergies 5)  Proventil Hfa 108 (90 Base) Mcg/act Aers (Albuterol sulfate) .... Inhale 1-2 puff every 4-6 hours as needed shortness of breath if this is not covered under insurance for albuterol please provide what is 6)  Ibuprofen 800 Mg Tabs (Ibuprofen) .Marland Kitchen.. 1 by mouth three times a day as needed pain 7)  Flexeril 10 Mg Tabs (Cyclobenzaprine hcl) .Marland Kitchen.. 1 by mouth  two times a day 8)  Zoloft 25 Mg Tabs (Sertraline hcl) .Marland Kitchen.. 1 by mouth daily  Patient Instructions: 1)  Follow up in 1 month for the zoloft 2)  Continue with the multitivitamins 3)  Continue all other medication Prescriptions: ZOLOFT 25 MG TABS (SERTRALINE HCL) 1 by mouth daily  #30 x 0   Entered and Authorized by:   Milinda Antis MD   Signed by:   Milinda Antis MD on 01/04/2010   Method used:   Electronically to        [facility] [phone]* (retail)       [address]       [place], [place]  [address]       Ph: [phone]       Fax: [phone]   RxID:   [id number]

## 2010-11-17 NOTE — Miscellaneous (Signed)
Summary: Simvastatin  Clinical Lists Changes  Medications: Added new medication of SIMVASTATIN 20 MG TABS (SIMVASTATIN) 1 by mouth daily for cholesterol - Signed Rx of SIMVASTATIN 20 MG TABS (SIMVASTATIN) 1 by mouth daily for cholesterol;  #30 x 3;  Signed;  Entered by: Milinda Antis MD;  Authorized by: Milinda Antis MD;  Method used: Electronically to [facility] [phone]*, [address], [place], [place]  [address], Ph: [phone], Fax: [phone]    Prescriptions: SIMVASTATIN 20 MG TABS (SIMVASTATIN) 1 by mouth daily for cholesterol  #30 x 3   Entered and Authorized by:   Milinda Antis MD   Signed by:   Milinda Antis MD on 09/26/2010   Method used:   Electronically to        [facility] [phone]* (retail)       [address]       [place], [place]  [address]       Ph: [phone]       Fax: [phone]   RxID:   [id number]

## 2010-11-17 NOTE — Progress Notes (Signed)
Summary: RX  Phone Note Call from Patient Call back at Home Phone [phone]   Reason for Call: Talk to Nurse Summary of Call: pt called [facility] pharmacy & they do not have her RXs that were suppose to be sent on 3/2. pt needs asap Initial call taken by: Knox Royalty,  December 29, 2009 9:25 AM  Follow-up for Phone Call        Rx are at pharmacy . patient notified. Follow-up by: Theresia Lo RN,  December 29, 2009 10:57 AM

## 2010-11-17 NOTE — Progress Notes (Signed)
Summary: triage  Phone Note Call from Patient Call back at Work Phone [phone]   Caller: Patient Summary of Call: Pt has rash and would like to come in today. Initial call taken by: Clydell Hakim,  May 12, 2010 10:57 AM  Follow-up for Phone Call        states she thinks she has shingles. started on face. itchy & burning. blisters x 2 wk. now on chest. she will be here for 1:30 work in. aware there may be a wait Follow-up by: Golden Circle RN,  May 12, 2010 11:00 AM

## 2010-11-17 NOTE — Assessment & Plan Note (Signed)
Summary: tb test,df  Nurse Visit   Allergies: No Known Drug Allergies  Immunizations Administered:  PPD Skin Test:    Vaccine Type: PPD    Site: right forearm    Mfr: Sanofi Pasteur    Dose: 0.1 ml    Route: ID    Given by: Theresia Lo RN    Exp. Date: 07/29/2011    Lot #: [id number]  Orders Added: 1)  TB Skin Test [86580] 2)  Admin 1st Vaccine [phone]

## 2010-11-17 NOTE — Progress Notes (Signed)
Summary: triage  Phone Note Call from Patient Call back at Home Phone [phone]   Caller: Patient Summary of Call: Pt had bacterial inf and was given medication and then saw Dr. Jeanice Lim on the 2nd and told her it was not gone.  Dr. Jeanice Lim told her to give it a week and if it did not clear up to call her back and she would call in medication again.  Pt uses [facility] [address]  Initial call taken by: Clydell Hakim,  December 22, 2009 3:19 PM  Follow-up for Phone Call        to pcp Follow-up by: Golden Circle RN,  December 22, 2009 3:25 PM  Additional Follow-up for Phone Call Additional follow up Details #1::        Given supository x 5 days, if no better in 1 week needs repeat visit Additional Follow-up by: Milinda Antis MD,  December 22, 2009 5:58 PM    Additional Follow-up for Phone Call Additional follow up Details #2::    lm that rx she wanted was at [facility] [address] to call back if it does not help Follow-up by: Golden Circle RN,  December 23, 2009 8:54 AM  New/Updated Medications: METROGEL-VAGINAL 0.75 % GEL (METRONIDAZOLE) 1 applicator per vagina at bedtime x 5 days Prescriptions: METROGEL-VAGINAL 0.75 % GEL (METRONIDAZOLE) 1 applicator per vagina at bedtime x 5 days  #5 x 1   Entered and Authorized by:   Milinda Antis MD   Signed by:   Milinda Antis MD on 12/22/2009   Method used:   Electronically to        [facility] [phone]* (retail)       [address]       [place], [place]  [address]       Ph: [phone]       Fax: [phone]   RxID:   [id number]

## 2010-11-17 NOTE — Assessment & Plan Note (Signed)
Summary: no better from last OV/[place]   Vital Signs:  Patient profile:   55 year old female Weight:      224.8 pounds Temp:     98.7 degrees F oral Pulse rate:   91 / minute BP sitting:   125 / 85  (left arm) Cuff size:   large  Vitals Entered By: Arlyss Repress CMA, (June 06, 2010 3:23 PM) CC: f/up last OV. cough x 2 weeks. fever at night. Is Patient Diabetic? No Pain Assessment Patient in pain? no        Primary Care Provider:  Milinda Antis MD  CC:  f/up last OV. cough x 2 weeks. fever at night..  History of Present Illness: Pt recently seen for URI symptoms 8/18. Has been  taking pseudophed and OTC chest congestion meds. + Intermittent HA, eraache, sore throat, + cough, + nasal congestion, worse at night, nightitme subjective sweats. though no documented fever.. Eating and drinking OK, good UOP. No N/V. No odynphagia, trismus. Myalgias resolved.Feel like symptoms have improved since initial onset but concerned that symptoms have not gotten away.   Habits & Providers  Alcohol-Tobacco-Diet     Tobacco Status: never  Allergies: No Known Drug Allergies  Physical Exam  General:  alert, in minimal distressalert.   Head:  normocephalic and atraumatic.  normocephalic and atraumatic.   Eyes:  vision grossly intact.  vision grossly intact.   Ears:  moderate fluid behind L TM, R TM clear Nose:  + nasal erythema bilaterally Mouth:  + tonsillar hypertrophy, post oropharyngeal erythema, post nasal changes.  Neck:  large  neck girth, no LAD Lungs:  CTAB, no wheezes, rales, rhoncii Heart:  RRR, no rubs, gallops, murmurs Abdomen:  obese, NT, ND, + bowel sounds  Neurologic:  2+ peripheral pulses.    Impression & Recommendations:  Problem # 1:  VIRAL URI (ICD-465.9) Overall symptomatology c/w likley viral URI. However will obtain rapid strep to rule bacterial infection.  Low clinical threshold for starting abx if sxs persist >2 weeks, as pt at increased risk for  conversion to bacterial process. Red flags reviewed extensively w/ pt. Told that viral URIs usually take 2-4 weeks to resolved. Improved symptomatology reassuring. Pt instructed to use as needed nasal saline for nasal irrigation.  Her updated medication list for this problem includes:    Bayer Childrens Aspirin 81 Mg Chew (Aspirin) .Marland Kitchen... Take 1 tablet by mouth once a day    Loratadine 10 Mg Tabs (Loratadine) .Marland Kitchen... Take one tablet daily as needed for allergies    Ibuprofen 800 Mg Tabs (Ibuprofen) .Marland Kitchen... 1 by mouth three times a day as needed pain  Her updated medication list for this problem includes:    Bayer Childrens Aspirin 81 Mg Chew (Aspirin) .Marland Kitchen... Take 1 tablet by mouth once a day    Loratadine 10 Mg Tabs (Loratadine) .Marland Kitchen... Take one tablet daily as needed for allergies    Ibuprofen 800 Mg Tabs (Ibuprofen) .Marland Kitchen... 1 by mouth three times a day as needed pain  Orders: [facility]- Est Level  3 (28413)  Complete Medication List: 1)  Bayer Childrens Aspirin 81 Mg Chew (Aspirin) .... Take 1 tablet by mouth once a day 2)  Hydrochlorothiazide 25 Mg Tabs (Hydrochlorothiazide) .... Take 1 tablet by mouth once a day 3)  Metoprolol Tartrate 50 Mg Tabs (Metoprolol tartrate) .... Take 1 tablet by mouth twice a day 4)  Loratadine 10 Mg Tabs (Loratadine) .... Take one tablet daily as needed for allergies 5)  Proventil  Hfa 108 (90 Base) Mcg/act Aers (Albuterol sulfate) .... Inhale 1-2 puff every 4-6 hours as needed shortness of breath if this is not covered under insurance for albuterol please provide what is 6)  Ibuprofen 800 Mg Tabs (Ibuprofen) .Marland Kitchen.. 1 by mouth three times a day as needed pain 7)  Flexeril 10 Mg Tabs (Cyclobenzaprine hcl) .Marland Kitchen.. 1 by mouth  two times a day 8)  Zoloft 25 Mg Tabs (Sertraline hcl) .Marland Kitchen.. 1 by mouth daily 9)  Triamcinolone Acetonide 0.1 % Crea With Eucerin 1:1  .... Apply two times a day to rash for 10 days.  Other Orders: Rapid Strep-[facility] (45409)  Patient Instructions: 1)  It was  good to meet you today 2)  You are likely dealing w/ a viral process that is causing your symptoms. If your symptoms worsen over the next week. Please give [place] a call. 3)  Otherwise continue current regimen, with use of nasal saline 3-4 times per day 4)  Otherwise call with any questions 5)  God Bless, 6)  Doree Albee MD   Laboratory Results  Date/Time Received: June 06, 2010 3:48 PM  Date/Time Reported: June 06, 2010 4:28 PM   Other Tests  Rapid Strep: negative Comments: ...............test performed by......Marland KitchenBonnie A. [place], MLS (ASCP)cm

## 2010-11-17 NOTE — Progress Notes (Signed)
Summary: TB test ques  Phone Note Call from Patient Call back at Home Phone [phone]   Caller: Patient Summary of Call: Needs to know when her last tb test was. Initial call taken by: Clydell Hakim,  January 17, 2010 3:51 PM  Follow-up for Phone Call        patient notified that last PPD was 12/25/2008. Follow-up by: Theresia Lo RN,  January 17, 2010 4:04 PM

## 2010-11-17 NOTE — Assessment & Plan Note (Signed)
Summary: READ PPD/[facility]  Nurse Visit   Allergies: No Known Drug Allergies  PPD Results    Date of reading: 01/28/2010    Results: 0 mm    Interpretation: negative  Orders Added: 1)  No Charge Patient Arrived (NCPA0) [NCPA0]   results faxed to [facility] as directed by patient . ROI signed and put in to be scanned box. Theresia Lo RN  January 28, 2010 9:59 AM

## 2010-11-17 NOTE — Miscellaneous (Signed)
Summary: ROI  ROI   Imported By: Bradly Bienenstock 02/11/2010 15:08:05  _____________________________________________________________________  External Attachment:    Type:   Image     Comment:   External Document

## 2010-11-17 NOTE — Progress Notes (Signed)
Summary: Lab results/Simvastatin  Phone Note Outgoing Call   Call placed by: Milinda Antis MD,  September 26, 2010 1:53 PM Details for Reason: lab results Summary of Call: LVM for pt to return call. Bad cholesterol is elevated, at 143 has not improved with fish oil, with her risk factors of high blood pressure she needs to start a medication for her cholesterol call Simvastatin. [facility] dose medication. Side effects are typically muscle aches and cramps, she needs to let me know if she has this side effect  I also sent a letter to her house, with the lab results and instructions  Follow-up for Phone Call        letter mailed. Follow-up by: Arlyss Repress CMA,,  September 26, 2010 5:31 PM

## 2010-11-17 NOTE — Miscellaneous (Signed)
Summary: Bee Pollen  Clinical Lists Changes   I have researched the Bee Pollen supplement. There is no scientific evidence shown that it works for weight loss or cholesterol reduction. Side effections are usually allergic reactions which can be severe, otherwise it appears to be a benign substance. If patient wants she can start with 1 capsule a day and work her way to recommendations on the bottle. She must stop if she has an allergic reaction. There is no specific tests for me to monitor her use, so this will be at her discretion.  Milinda Antis MD  December 28, 2009 7:26 PM   Appended Document: Bee Pollen unable to reach pt. mailed above info

## 2010-11-17 NOTE — Assessment & Plan Note (Signed)
Summary: vag prob,df   Vital Signs:  Patient profile:   55 year old female Height:      61.5 inches Weight:      230 pounds BMI:     42.91 BSA:     2.02 Temp:     98.2 degrees F Pulse rate:   73 / minute BP sitting:   125 / 88  Vitals Entered By: Jone Baseman CMA (November 30, 2009 10:43 AM) CC: vaginal problem Is Patient Diabetic? No Pain Assessment Patient in pain? no        Primary Care Provider:  Milinda Antis MD  CC:  vaginal problem.  History of Present Illness: Patient here for c/o vaginal discharge, itching and burning x 2 weeks.  No new sex partners.  Wet prep with 3+ bacteria, no other evidence of infection.    Patient also wants a flu vaccine.   She is also c/o diarrhea (approx 5-6 stools daily, watery). 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  She denies fever, no blood in the stool.    Habits & Providers  Alcohol-Tobacco-Diet     Tobacco Status: never  Allergies: No Known Drug Allergies  Physical Exam  General:  Well-developed,well-nourished,in no acute distress; alert,appropriate and cooperative throughout examination Lungs:  Normal respiratory effort, chest expands symmetrically. Lungs are clear to auscultation, no crackles or wheezes. Heart:  Normal rate and regular rhythm. Abdomen:  Bowel sounds positive,abdomen soft and non-tender without masses, organomegaly or hernias noted. Genitalia:  Normal introitus for age, no external lesions, thin grayish vaginal discharge, mucosa pink and moist, no vaginal or cervical lesions, no vaginal atrophy, no friaility or hemorrhage   Impression & Recommendations:  Problem # 1:  UNSPECIFIED VAGINITIS AND VULVOVAGINITIS (ICD-616.10) Rx for flagyl x 7 days for BV as patient is symptomatic.    Her updated medication list for this problem includes:    Flagyl 500 Mg Tabs (Metronidazole) ..... One tablet by mouth two times a day x 7 days  Orders: Wet Prep- [facility]  (87210) [facility]- Est Level  3 (16109)  Problem # 2:  DIARRHEA (ICD-787.91)  Likely viral.  Can try OTC Immodium. Recommend appropriate hand hygiene with soap and water.  RTC if symptoms not resolved in 3-4 days.  Continue by mouth hydration.    Orders: [facility]- Est Level  3 (60454)  Complete Medication List: 1)  Bayer Childrens Aspirin 81 Mg Chew (Aspirin) .... Take 1 tablet by mouth once a day 2)  Hydrochlorothiazide 25 Mg Tabs (Hydrochlorothiazide) .... Take 1 tablet by mouth once a day 3)  Metoprolol Tartrate 50 Mg Tabs (Metoprolol tartrate) .... Take 1 tablet by mouth twice a day 4)  Loratadine 10 Mg Tabs (Loratadine) .... Take one tablet daily as needed for allergies 5)  Proventil Hfa 108 (90 Base) Mcg/act Aers (Albuterol sulfate) .... Inhale 1-2 puff every 4-6 hours as needed shortness of breath if this is not covered under insurance for albuterol please provide what is 6)  Flagyl 500 Mg Tabs (Metronidazole) .... One tablet by mouth two times a day x 7 days  Other Orders: Influenza Vaccine NON MCR (09811) Prescriptions: FLAGYL 500 MG TABS (METRONIDAZOLE) one tablet by mouth two times a day x 7 days  #14 x 0   Entered and Authorized by:   Jettie Pagan MD   Signed by:   Jettie Pagan MD on 11/30/2009   Method used:   Electronically to  [facility] [address] [phone]* (retail)       [address]       [place], [place]  [address]       Ph: [phone]       Fax: [phone]   RxID:   [id number]   Laboratory Results  Date/Time Received: November 30, 2009 11:12 AM  Date/Time Reported: November 30, 2009 11:23 AM   Wet [place] Source: vag WBC/hpf: 5-10 Bacteria/hpf: 3+  Rods Clue cells/hpf: none  Negative whiff Yeast/hpf: none Trichomonas/hpf: none Comments: ...............test performed by......Marland KitchenBonnie A. [place], MLS (ASCP)cm     Immunizations Administered:  Influenza Vaccine # 1:    Vaccine Type: Fluvax Non-MCR    Site: right deltoid    Mfr:  GlaxoSmithKline    Dose: 0.5 ml    Route: IM    Given by: Jone Baseman CMA    Exp. Date: 04/14/2010    Lot #: [id number]    VIS given: 8.10.10  Flu Vaccine Consent Questions:    Do you have a history of severe allergic reactions to this vaccine? no    Any prior history of allergic reactions to egg and/or gelatin? no    Do you have a sensitivity to the preservative Thimersol? no    Do you have a past history of Guillan-Barre Syndrome? no    Do you currently have an acute febrile illness? no    Have you ever had a severe reaction to latex? no    Vaccine information given and explained to patient? yes    Are you currently pregnant? no

## 2010-11-17 NOTE — Progress Notes (Signed)
Summary: triage  Phone Note Call from Patient Call back at Work Phone [phone]   Caller: Patient Summary of Call: pt feels worse than the other day. Initial call taken by: De Nurse,  June 06, 2010 8:38 AM  Follow-up for Phone Call        states she is taking meds but does not feel any better. wants to be seen. appt at 3:15 today with Dr. Alvester Morin. to drink plenty of fluids & rest Follow-up by: Golden Circle RN,  June 06, 2010 8:40 AM

## 2010-11-17 NOTE — Assessment & Plan Note (Addendum)
Summary: uti?, heel pain ,df   Vital Signs:  Patient profile:   55 year old female Height:      62 inches Weight:      227 pounds BMI:     41.67 Temp:     98.3 degrees F oral Pulse rate:   75 / minute BP sitting:   135 / 72  (left arm) Cuff size:   large  Vitals Entered By: Tessie Fass CMA (November 01, 2010 10:24 AM) CC: UTI, breast pain, heel pain   Primary Care Provider:  Milinda Antis MD  CC:  UTI, breast pain, and heel pain.  History of Present Illness:    +dysuria, +suprapubic pressure x 5 days, noticed foul odor to urine, no blood in urne, has not had any vaginal discharge, no fever, took cranberry juice, +urgency     Took2 doses  tablet of Simvastatin,  had a hallucination of her son, was half sleep, the medication made her sleepy and somnolent, +upset stomach, and muscle aches/ felt joint pains were worse, symptoms resoled after 48 hours  Has started natural fish oil    Has had sharp pain in the breast in both breast, intermitantly, has not had any lumps, Mammogram normal in Feb 2011  Heel pain - feels pain or discomofort with some swelling over bilat heels, started when she started her exercise program, no other acute injury, no rash, no skin chnages, no swelling noticed, not wearing proper shoes for work outs  Current Medications (verified): 1)  Bayer Childrens Aspirin 81 Mg Chew (Aspirin) .... Take 1 Tablet By Mouth Once A Day 2)  Hydrochlorothiazide 25 Mg Tabs (Hydrochlorothiazide) .... Take 1 Tablet By Mouth Once A Day 3)  Metoprolol Tartrate 50 Mg Tabs (Metoprolol Tartrate) .... Take 1 Tablet By Mouth Twice A Day 4)  Loratadine 10 Mg Tabs (Loratadine) .... Take One Tablet Daily As Needed For Allergies 5)  Proventil Hfa 108 (90 Base) Mcg/act Aers (Albuterol Sulfate) .... Inhale 1-2 Puff Every 4-6 Hours As Needed Shortness of Breath If This Is Not Covered Under Insurance For Albuterol Please Provide What Is 6)  Triamcinolone Acetonide 0.1 % Crea With Eucerin  1:1 .... Apply Two Times A Day To Rash For 10 Days. 7)  Fish Oil 1000 Mg Caps (Omega-3 Fatty Acids) .Marland Kitchen.. 1 By Mouth Three Times A Day 8)  Keflex 500 Mg Caps (Cephalexin) .Marland Kitchen.. 1 By Mouth Two Times A Day X 3 Days 9)  Pyridium 100 Mg Tabs (Phenazopyridine Hcl) .Marland Kitchen.. 1 Tab By Mouth Three Times A Day X 2 Days  Allergies (verified): 1)  ! Zocor (Simvastatin)  Review of Systems       Per HPI  Physical Exam  General:  NAD, obese, Vital signs noted Very happy and smiling Breasts:  skin/areolae normal, no masses, and no abnormal thickening.  no axillary nodes , +soreness throught with palpation, no erythema noted Abdomen:  soft, normal bowel sounds, and no distention +suprapubic pressure.  no rebound or gaurding no CVA tenderness Msk:  Normal ROM and strength bilat ankles No swelling of feet noted Achilles mild tenderness at approx 2cm above heels Heels non tender Soles non tender Ligaments in tact no erythema noted no ecchymosis Neurologic:  normal gait weight bearing equal bilat on lower ext   Impression & Recommendations:  Problem # 1:  ACUTE CYSTITIS (ICD-595.0) Assessment New Treat with Keflex x 3 days, uncompliated cystitis    Her updated medication list for this problem includes:    Keflex  500 Mg Caps (Cephalexin) .Marland Kitchen... 1 by mouth two times a day x 3 days    Pyridium 100 Mg Tabs (Phenazopyridine hcl) .Marland Kitchen... 1 tab by mouth three times a day x 2 days  Problem # 2:  BREAST PAIN, BILATERAL (ICD-611.71) Assessment: New  Normal exam , pt to have Mammogram in Feb, will try to get early though nothing concerning on exam, no evidence of infection either. 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  If no improvement or worsening of symptoms send to [facility] for ultrasound  Orders: [facility]- Est  Level 4 (91478)  Complete Medication List: 1)  Bayer Childrens Aspirin 81 Mg Chew (Aspirin) .... Take 1 tablet by mouth once a day 2)  Hydrochlorothiazide 25 Mg Tabs (Hydrochlorothiazide) .... Take 1 tablet by mouth once a day 3)  Metoprolol Tartrate 50 Mg Tabs (Metoprolol tartrate) .... Take 1 tablet by mouth twice a day 4)  Loratadine 10 Mg Tabs (Loratadine) .... Take one tablet daily as needed for allergies 5)  Proventil Hfa 108 (90 Base) Mcg/act Aers (Albuterol sulfate) .... Inhale 1-2 puff every 4-6 hours as needed shortness of breath if this is not covered under insurance for albuterol please provide what is 6)  Triamcinolone Acetonide 0.1 % Crea With Eucerin 1:1  .... Apply two times a day to rash for 10 days. 7)  Fish Oil 1000 Mg Caps (Omega-3 fatty acids) .Marland Kitchen.. 1 by mouth three times a day 8)  Keflex 500 Mg Caps (Cephalexin) .Marland Kitchen.. 1 by mouth two times a day x 3 days 9)  Pyridium 100 Mg Tabs (Phenazopyridine hcl) .Marland Kitchen.. 1 tab by mouth three times a day x 2 days  Other Orders: Urinalysis-[facility] (00000)  Patient Instructions: 1)  Schedule your Mammogram now 2)  Increase your fish oil to 1000mg  three times a day 3)  Take the antibiotics for your urine infection 4)  Next visit in April  to f/u your blood pressure 5)  Ice your heels if needed after work-outs 6)  Wear very supportive shoes 7)  Take Ibuprofen 400mg  every 6 hours as needed for pain or Advil which is an anti-inflammatory 8)  Take the Antibiotics for your urine infection 9)  Use can use the pyridium to help with the pressure Prescriptions: PYRIDIUM 100 MG TABS (PHENAZOPYRIDINE HCL) 1 tab by mouth three times a day x 2 days  #6 x 0   Entered and Authorized by:   Milinda Antis MD   Signed by:   Milinda Antis MD on 11/01/2010   Method used:   Electronically to        [facility] [phone]* (retail)       [address]       [place], [place]  [address]       Ph: [phone]       Fax: [phone]   RxID:   [id number] KEFLEX 500 MG CAPS (CEPHALEXIN) 1 by mouth two times a day x 3 days  #6 x 0   Entered and Authorized by:   Milinda Antis MD   Signed by:   Milinda Antis MD on 11/01/2010   Method used:   Electronically to  [facility] [address] [phone]* (retail)       [address]       [place], [place]  [address]       Ph: [phone]       Fax: [phone]   RxID:   [id number]    Orders Added: 1)  Urinalysis-[facility] [00000] 2)  [facility]- Est  Level 4 [25366]     Prevention & Chronic Care Immunizations   Influenza vaccine: Fluvax Non-MCR  (09/19/2010)   Influenza vaccine due: 11/09/2009    Tetanus booster: 10/16/2000: Done.   Tetanus booster due: 10/16/2010    Pneumococcal vaccine: Not documented  Colorectal Screening   Hemoccult: Not documented   Hemoccult due: Not Indicated    Colonoscopy: Location:  [facility].    (12/08/2008)   Colonoscopy due: 12/2013  Other Screening   Pap smear: normal  (01/04/2009)   Pap smear due: 01/05/2011    Mammogram: ASSESSMENT: Negative - BI-RADS 1^MM DIGITAL SCREENING  (12/07/2009)   Mammogram due: 12/04/2009   Smoking status: never  (09/19/2010)  Lipids   Total Cholesterol: 222  (09/23/2010)   LDL: 143  (09/23/2010)   LDL Direct: 129  (04/06/2009)   HDL: 62  (09/23/2010)   Triglycerides: 83  (09/23/2010)    SGOT (AST): 17  (11/09/2008)   SGPT (ALT): 14  (11/09/2008)   Alkaline phosphatase: 89  (11/09/2008)   Total bilirubin: 0.3  (11/09/2008)  Hypertension   Last Blood Pressure: 135 / 72  (11/01/2010)   Serum creatinine: 0.78  (09/23/2010)   Serum potassium 4.1  (09/23/2010)  Self-Management Support :    Hypertension self-management support: Not documented    Lipid self-management support: Not documented    Laboratory Results   Urine Tests  Date/Time Received: November 01, 2010 10:27  AM  Date/Time Reported: November 01, 2010 11:05 AM   Routine Urinalysis   Color: yellow Appearance: slightly Cloudy Glucose: negative   (Normal Range: Negative) Bilirubin: negative   (Normal Range: Negative) Ketone: negative   (Normal Range: Negative) Spec. Gravity: 1.020   (Normal Range: 1.003-1.035) Blood: moderate   (Normal Range: Negative) pH: 6.0   (Normal Range: 5.0-8.0) Protein: negative   (Normal Range: Negative) Urobilinogen: 0.2   (Normal Range: 0-1) Nitrite: negative   (Normal Range: Negative) Leukocyte Esterace: large   (Normal Range: Negative)  Urine Microscopic WBC/HPF: loaded RBC/HPF: 5-10 Bacteria/HPF: 2+ Epithelial/HPF: 10-20    Comments: ...............test performed by......Marland KitchenBonnie A. [place], MLS (ASCP)cm     Appended Document: uti?,df pharmacy sent request to substitute Phenazopyridine for pyridium. consulted Dr. Sw[place] and she advises this is ok. pharmacist notified.

## 2010-11-17 NOTE — Progress Notes (Signed)
  Phone Note Call from Patient   Caller: Patient Call For: [phone] Summary of Call: Need to change meds for cholesterol.  Having some bad side effects from it.   Initial call taken by: Abundio Miu,  September 29, 2010 8:33 AM  Follow-up for Phone Call        message left to retun call. Follow-up by: Theresia Lo RN,  September 29, 2010 8:53 AM  Additional Follow-up for Phone Call Additional follow up Details #1::        Pt is ready for you to return her call.  Additional Follow-up by: Abundio Miu,  September 29, 2010 10:26 AM    Additional Follow-up for Phone Call Additional follow up Details #2::    patient states she has taken simvastatin x 2, last night and night before last. she is expriencing joint pains, trouble sleeping, when she wakes up her chest feels heavy, headache , feels weak. she was also noticing that one of the side effects is depression and she just doesn't fell like her usual self.  will forward message to MD. Follow-up by: Theresia Lo RN,  September 29, 2010 11:49 AM  Additional Follow-up for Phone Call Additional follow up Details #3:: Details for Additional Follow-up Action Taken: consulted with Dr. Leveda Anna and he advises to tell patient to stop  simvastatin and will forward message to Dr. Gilford Raid for further instructions. Additional Follow-up by: Theresia Lo RN,  September 29, 2010 11:53 AM    We will discuss at the next visit for now ,I want her to take her Fish oil 1 tablet three times a day. Milinda Antis MD  September 30, 2010 1:56 PM   message left to return call. Theresia Lo RN  September 30, 2010 1:58 PM

## 2010-11-17 NOTE — Assessment & Plan Note (Signed)
Summary: vag prob,df   Vital Signs:  Patient profile:   55 year old female Weight:      228 pounds Pulse rate:   70 / minute BP sitting:   130 / 82  (right arm) Cuff size:   large  Vitals Entered By: Arlyss Repress CMA, (September 19, 2010 11:58 AM) CC: c/o hot flashes, dryness and itching of vagina. LMP 2-11. c/o vag d/c x 1 week. denies pain. but, feels like her partner has cheated on her. Is Patient Diabetic? No Pain Assessment Patient in pain? no        Primary Care Provider:  Milinda Antis MD  CC:  c/o hot flashes, dryness and itching of vagina. LMP 2-11. c/o vag d/c x 1 week. denies pain. but, and feels like her partner has cheated on her..  History of Present Illness:  Vaginal discharge- x 1 week white, foul odor, similar to yeast, +pruritis, no dysuria, feels irritated, sexually active with 1 partner but when she has symptoms after sex feels like she needs to be checked not sure if he is monogomous, no abd pain, no nausea - denies douching, uses body wash in vaginal area  LMP- Feb 2011, spotting in June -- perimenopausal  Mood- has been good, she has some hot flashes which makes her overwhelmed for a few minutes, but otherwise feels centered and in a good spiratural position, no crying episodes, sleeping well, appetite well, did not take the Zoloft previously  Does not feel her hot flashes are severe, taking fish oil and multivitamin which helps     Habits & Providers  Alcohol-Tobacco-Diet     Tobacco Status: never  Current Medications (verified): 1)  Bayer Childrens Aspirin 81 Mg Chew (Aspirin) .... Take 1 Tablet By Mouth Once A Day 2)  Hydrochlorothiazide 25 Mg Tabs (Hydrochlorothiazide) .... Take 1 Tablet By Mouth Once A Day 3)  Metoprolol Tartrate 50 Mg Tabs (Metoprolol Tartrate) .... Take 1 Tablet By Mouth Twice A Day 4)  Loratadine 10 Mg Tabs (Loratadine) .... Take One Tablet Daily As Needed For Allergies 5)  Proventil Hfa 108 (90 Base) Mcg/act Aers  (Albuterol Sulfate) .... Inhale 1-2 Puff Every 4-6 Hours As Needed Shortness of Breath If This Is Not Covered Under Insurance For Albuterol Please Provide What Is 6)  Triamcinolone Acetonide 0.1 % Crea With Eucerin 1:1 .... Apply Two Times A Day To Rash For 10 Days. 7)  Fish Oil 1000 Mg Caps (Omega-3 Fatty Acids) .Marland Kitchen.. 1 By Mouth Two Times A Day 8)  Diflucan 150 Mg Tabs (Fluconazole) .... Take 1 Tablet Now , May Repeat in 3 Days If Needed  Allergies (verified): No Known Drug Allergies  Review of Systems       PER hpi  Physical Exam  General:  NAD, obese, Vital signs noted Very happy and smiling Abdomen:  soft and non-tender.   Genitalia:  normal introitus and no external lesions.  White mild odorous discharge, thick discharge but small quantitiy, no internal lesions on cervix, no atrophy no abnormal bleeding, no CMT Psych:  Oriented X3, memory intact for recent and remote, normally interactive, good eye contact, not anxious appearing, and not depressed appearing.     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 Take 1 tablet by mouth once a day 2)  Hydrochlorothiazide 25 Mg Tabs (Hydrochlorothiazide) .... Take 1 tablet by mouth once a day 3)  Metoprolol Tartrate 50 Mg Tabs (Metoprolol tartrate) .... Take 1 tablet by mouth twice a day 4)  Loratadine 10 Mg Tabs (Loratadine) .... Take one tablet daily as needed for allergies 5)   Proventil Hfa 108 (90 Base) Mcg/act Aers (Albuterol sulfate) .... Inhale 1-2 puff every 4-6 hours as needed shortness of breath if this is not covered under insurance for albuterol please provide what is 6)  Triamcinolone Acetonide 0.1 % Crea With Eucerin 1:1  .... Apply two times a day to rash for 10 days. 7)  Fish Oil 1000 Mg Caps (Omega-3 fatty acids) .Marland Kitchen.. 1 by mouth two times a day 8)  Diflucan 150 Mg Tabs (Fluconazole) .... Take 1 tablet now , may repeat in 3 days if needed  Other Orders: Wet Prep- [facility] [id number]) Influenza Vaccine NON MCR (98119) Future Orders: Basic Met-[facility] ([id number]) ... 09/29/2011 Lipid-[facility] ([id number]) ... 09/30/2011 Free T4-[facility] [id number]) ... 09/29/2011 Free T3-[facility] [phone]) ... 09/22/2011 TSH-[facility] ([id number]) ... 09/21/2011  Patient Instructions: 1)  Next visit in 3 months to f/u blood pressure 2)  Come in to have cholesterol and labs checked, do not eat after midnight 3)  Avoid harsh soaps and body washes with fragrances 4)  Take Fish oil 1000mg  twice a day 5)  Take a multivitamin daily  Prescriptions: DIFLUCAN 150 MG TABS (FLUCONAZOLE) Take 1 tablet now , may repeat in 3 days if needed  #2 x 1   Entered and Authorized by:   Milinda Antis MD   Signed by:   Milinda Antis MD on 09/19/2010   Method used:   Electronically to        [facility] [phone]* (retail)       [address]       [place], [place]  [address]       Ph: [phone]       Fax: [phone]   RxID:   [id number]    Orders Added: 1)  GC/Chlamydia-[facility] [87591/87491] 2)  Wet Prep- [facility] [27062] 3)  Influenza Vaccine NON MCR [00028] 4)  Basic Met-[facility] [37628-31517] 5)  Lipid-[facility] [80061-22930] 6)  Free T4-[facility] [61607-37106] 7)  Free T3-[facility] [26948-54627] 8)  TSH-[facility] [03500-93818] 9)  [facility]- Est  Level 4 [29937]   Immunizations Administered:  Influenza Vaccine # 1:    Vaccine Type: Fluvax Non-MCR    Site: left deltoid    Mfr: GlaxoSmithKline    Dose: 0.5 ml     Route: IM    Given by: Arlyss Repress CMA,    Exp. Date: 04/15/2011    Lot #: [id number]    VIS given: 05/10/10 version given September 19, 2010.  Flu Vaccine Consent Questions:    Do you have a history of severe allergic reactions to this vaccine? no    Any prior history of allergic reactions to egg and/or gelatin? no    Do you have a sensitivity to the preservative Thimersol? no    Do you have a past history of Guillan-Barre Syndrome? no    Do you currently have an acute febrile illness? no  Have you ever had a severe reaction to latex? no    Vaccine information given and explained to patient? yes    Are you currently pregnant? no   Immunizations Administered:  Influenza Vaccine # 1:    Vaccine Type: Fluvax Non-MCR    Site: left deltoid    Mfr: GlaxoSmithKline    Dose: 0.5 ml    Route: IM    Given by: Arlyss Repress CMA,    Exp. Date: 04/15/2011    Lot #: [id number]    VIS given: 05/10/10 version given September 19, 2010.   Laboratory Results  Date/Time Received: September 19, 2010 12:08 PM  Date/Time Reported: September 19, 2010 12:40 PM   [employer] Source: vag WBC/hpf: 5-10 Bacteria/hpf: 3+  Rods Clue cells/hpf: none  Negative whiff Yeast/hpf: none Trichomonas/hpf: none Comments: ...............test performed by......Marland KitchenBonnie A. [place], MLS (ASCP)cm

## 2010-11-17 NOTE — Miscellaneous (Signed)
Summary: ROI  ROI   Imported By: Bradly Bienenstock 02/11/2010 15:11:22  _____________________________________________________________________  External Attachment:    Type:   Image     Comment:   External Document
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
## 2010-11-17 NOTE — Assessment & Plan Note (Signed)
Summary: sore throat,tcb   Vital Signs:  Patient profile:   55 year old female Weight:      226.8 pounds Temp:     98.1 degrees F oral Pulse rate:   89 / minute Pulse rhythm:   regular BP sitting:   118 / 86  (right arm) Cuff size:   large  Vitals Entered By: Loralee Pacas CMA (December 30, 2009 10:01 AM) CC: sore throat and hyperthyroid Comments would like to have thyroid checked and wondering if she has a goiter   Primary Care Provider:  Milinda Antis MD  CC:  sore throat and hyperthyroid.  History of Present Illness: 1.  sore throat--used to get sore throat all the time.  past  3 days.  no fevers.  rhinnorhea--took some claritin yesterday which helped both rhinnorhea and sore throat.  no cough.  been around kids with respiratory symtpoms.  fleeting episodes of nausea, but has been having this off and on for a while.  right ear hurts.  facial pain.  has had some problems with allergies past few years.    2.  thyroid--worried becasue a friend of her looked at her neck and asked her if she had a goiter.  Goiter has been noted on exam in the past.  in the past has had low normal TSH with low normal T3 and T4.  slightly elevated TPO antibodies.  Last measured in 9/10.  Dr. Deirdre Peer note stated plan was to recheck in 6 months, which would be about now.  Is having some hot flashes, gets cold easily (but she attributes this to menopause).  no wt change, constipation, diarrhea, skin changes.    Current Medications (verified): 1)  Bayer Childrens Aspirin 81 Mg Chew (Aspirin) .... Take 1 Tablet By Mouth Once A Day 2)  Hydrochlorothiazide 25 Mg Tabs (Hydrochlorothiazide) .... Take 1 Tablet By Mouth Once A Day 3)  Metoprolol Tartrate 50 Mg Tabs (Metoprolol Tartrate) .... Take 1 Tablet By Mouth Twice A Day 4)  Loratadine 10 Mg Tabs (Loratadine) .... Take One Tablet Daily As Needed For Allergies 5)  Proventil Hfa 108 (90 Base) Mcg/act Aers (Albuterol Sulfate) .... Inhale 1-2 Puff Every 4-6 Hours As  Needed Shortness of Breath If This Is Not Covered Under Insurance For Albuterol Please Provide What Is 6)  Ibuprofen 800 Mg Tabs (Ibuprofen) .Marland Kitchen.. 1 By Mouth Three Times A Day As Needed Pain 7)  Flexeril 10 Mg Tabs (Cyclobenzaprine Hcl) .Marland Kitchen.. 1 By Mouth  Two Times A Day 8)  Metrogel-Vaginal 0.75 % Gel (Metronidazole) .Marland Kitchen.. 1 Applicator Per Vagina At Bedtime X 5 Days  Allergies: No Known Drug Allergies  Review of Systems General:  Complains of chills and sweats; denies fever, loss of appetite, and weight loss. ENT:  Complains of sinus pressure and sore throat; denies decreased hearing, difficulty swallowing, and nasal congestion. Resp:  Denies cough. Endo:  Complains of cold intolerance; denies excessive hunger and weight change.  Physical Exam  General:  Well-developed,obese ,in no acute distress; alert,appropriate and cooperative throughout examination Vital signs noted  Eyes:  normal appearance Ears:  tms normal Nose:  clear rhinnorhea Mouth:  3+ tonsillar enlargement, but no significant injection or exudate Neck:  anterior lad.  diffusely enlarged thyroid left >right.  no nodules appreciated Lungs:  Normal respiratory effort, chest expands symmetrically. Lungs are clear to auscultation, no crackles or wheezes. Additional Exam:  vital signs reviewed    Impression & Recommendations:  Problem # 1:  ALLERGIC RHINITIS, SEASONAL (ICD-477.0)  Assessment Deteriorated  not sure if this is viral uri or her allergies.  advised her to try taking claritin every day for at least the next 2 weeks.  then she can try stopping.  if symptoms come back, probably her allergies.  it makes her a little sleepy; so advised she take it at night.  Orders: [facility]- Est Level  3 ([address]109)  Problem # 2:  THYROMEGALY (ICD-240.9) Assessment: Unchanged  diffuse thyromegaly, but no nodules palpated.  no symptoms of airway compromise.  due for labs.  will recheck; that way Dr Jeanice Lim will have results at next visit.   went ahead and ordered TPO antibodies again to follow titers.    Orders: [facility]- Est Level  3 ([address]) Miscellaneous Lab Charge-[facility] [phone]) Free T4-[facility] [phone]) Free T3-[facility] [id number]) TSH-[facility] [phone])  Complete Medication List: 1)  Bayer Childrens Aspirin 81 Mg Chew (Aspirin) .... Take 1 tablet by mouth once a day 2)  Hydrochlorothiazide 25 Mg Tabs (Hydrochlorothiazide) .... Take 1 tablet by mouth once a day 3)  Metoprolol Tartrate 50 Mg Tabs (Metoprolol tartrate) .... Take 1 tablet by mouth twice a day 4)  Loratadine 10 Mg Tabs (Loratadine) .... Take one tablet daily as needed for allergies 5)  Proventil Hfa 108 (90 Base) Mcg/act Aers (Albuterol sulfate) .... Inhale 1-2 puff every 4-6 hours as needed shortness of breath if this is not covered under insurance for albuterol please provide what is 6)  Ibuprofen 800 Mg Tabs (Ibuprofen) .Marland Kitchen.. 1 by mouth three times a day as needed pain 7)  Flexeril 10 Mg Tabs (Cyclobenzaprine hcl) .Marland Kitchen.. 1 by mouth  two times a day 8)  Metrogel-vaginal 0.75 % Gel (Metronidazole) .Marland Kitchen.. 1 applicator per vagina at bedtime x 5 days  Patient Instructions: 1)  It was nice to see you today. 2)  You may have a cold or you may have allergies.  3)  Try taking your claritin every day for the next 2 weeks.   4)  We will check your thyroid levels today, and I'll call you with results or send you a letter. 5)  Please schedule a follow-up appointment at your convenience with Dr. Jeanice Lim.  You can talk about your carpal tunnel at that time.

## 2010-11-17 NOTE — Assessment & Plan Note (Signed)
Summary: dry skin   Vital Signs:  Patient profile:   55 year old female Height:      62 inches Weight:      229.4 pounds BMI:     42.11 Temp:     72 degrees F BP sitting:   132 / 82  Vitals Entered By: Golden Circle RN (May 12, 2010 1:38 PM)  Primary Care Provider:  Milinda Antis MD   History of Present Illness: Itching skin: pt reports areas that are itching her on her back, on ankle, on arms, on face, and a new area on right beast.  The areas start out normal appearing but after itching the same spot becomes red and excoriated.  Pt is concerned that the shingles might be returning b/c sometime she has a "tingling" sensation that reminds her of how she felt when she had the shingles.  no fever.  no recent sickness. no recent chemical exposure.   Habits & Providers  Alcohol-Tobacco-Diet     Alcohol drinks/day: 0     Tobacco Status: never     Diet Counseling: to improve diet; diet is suboptimal  Exercise-Depression-Behavior     Does Patient Exercise: no     Exercise Counseling: to improve exercise regimen     Drug Use: never     Seat Belt Use: always  Current Medications (verified): 1)  Bayer Childrens Aspirin 81 Mg Chew (Aspirin) .... Take 1 Tablet By Mouth Once A Day 2)  Hydrochlorothiazide 25 Mg Tabs (Hydrochlorothiazide) .... Take 1 Tablet By Mouth Once A Day 3)  Metoprolol Tartrate 50 Mg Tabs (Metoprolol Tartrate) .... Take 1 Tablet By Mouth Twice A Day 4)  Loratadine 10 Mg Tabs (Loratadine) .... Take One Tablet Daily As Needed For Allergies 5)  Proventil Hfa 108 (90 Base) Mcg/act Aers (Albuterol Sulfate) .... Inhale 1-2 Puff Every 4-6 Hours As Needed Shortness of Breath If This Is Not Covered Under Insurance For Albuterol Please Provide What Is 6)  Ibuprofen 800 Mg Tabs (Ibuprofen) .Marland Kitchen.. 1 By Mouth Three Times A Day As Needed Pain 7)  Flexeril 10 Mg Tabs (Cyclobenzaprine Hcl) .Marland Kitchen.. 1 By Mouth  Two Times A Day 8)  Zoloft 25 Mg Tabs (Sertraline Hcl) .Marland Kitchen.. 1 By Mouth  Daily 9)  Triamcinolone Acetonide 0.1 % Crea With Eucerin 1:1 .... Apply Two Times A Day To Rash For 10 Days.  Allergies (verified): No Known Drug Allergies  Social History: Does Patient Exercise:  no Drug Use:  never Seat Belt Use:  always  Review of Systems       as per hpi  Physical Exam  General:  VSS Well-developed,well-nourished,in no acute distress; alert,appropriate and cooperative throughout examination Lungs:  Normal respiratory effort, chest expands symmetrically. Skin:  no skin lesions present on back.  one area in bend of left arm showed some hyperpigmentation compared to surrounding skin silver dollar in size.  Also hyperpigmented area on right ankle about a quarter in size.  Area on right medial breast showed redness and area of excoriation from scratching.  No rashes.  No drainage.     Impression & Recommendations:  Problem # 1:  DRY SKIN (ICD-701.1) most likely pt's symptoms are from eczema.  Reassured pt that shingles pain is usually located to one side of the body and the fact that her symptoms are spread all over her body make one think that this is related to a dry skin problem.  No lesions present except for one area of redness on right  breast that did not have redness or rash prior to her scratching.  The hypopigmented areas on exam are most likely healing areas of past eczema outbreak and a reaction sctraching. Pt given steriod cream mixed with eucerin.  Pt states understanding.  Orders: [facility]- Est Level  3 ([address])  Complete Medication List: 1)  Bayer Childrens Aspirin 81 Mg Chew (Aspirin) .... Take 1 tablet by mouth once a day 2)  Hydrochlorothiazide 25 Mg Tabs (Hydrochlorothiazide) .... Take 1 tablet by mouth once a day 3)  Metoprolol Tartrate 50 Mg Tabs (Metoprolol tartrate) .... Take 1 tablet by mouth twice a day 4)  Loratadine 10 Mg Tabs (Loratadine) .... Take one tablet daily as needed for allergies 5)  Proventil Hfa 108 (90 Base) Mcg/act Aers  (Albuterol sulfate) .... Inhale 1-2 puff every 4-6 hours as needed shortness of breath if this is not covered under insurance for albuterol please provide what is 6)  Ibuprofen 800 Mg Tabs (Ibuprofen) .Marland Kitchen.. 1 by mouth three times a day as needed pain 7)  Flexeril 10 Mg Tabs (Cyclobenzaprine hcl) .Marland Kitchen.. 1 by mouth  two times a day 8)  Zoloft 25 Mg Tabs (Sertraline hcl) .Marland Kitchen.. 1 by mouth daily 9)  Triamcinolone Acetonide 0.1 % Crea With Eucerin 1:1  .... Apply two times a day to rash for 10 days.  Patient Instructions: 1)  use cream as directed 2)  return if any new or worsening symptoms or if no improvement. Prescriptions: TRIAMCINOLONE ACETONIDE 0.1 % CREA WITH EUCERIN 1:1 apply two times a day to rash for 10 days.  #QS x 1   Entered and Authorized by:   Ellin Mayhew MD   Signed by:   Ellin Mayhew MD on 05/12/2010   Method used:   Print then Give to Patient   RxID:   [id number]

## 2010-11-17 NOTE — Progress Notes (Signed)
Summary: Rx Req  Phone Note Call from Patient Call back at [phone]   Caller: Patient Summary of Call: Pt thinks she is chemically off balance.  Wants to know if Dr. Jeanice Lim would consider perscribing Zoloft for her.  Initial call taken by: Clydell Hakim,  December 31, 2009 11:27 AM  Follow-up for Phone Call        to pcp. labs are not wnl. plz advise pt states she is not depressed, just tired & no energy. told her I will call her when md responds Follow-up by: Golden Circle RN,  December 31, 2009 11:42 AM  Additional Follow-up for Phone Call Additional follow up Details #1::        She can start Multivitamin daily. Excerise 30 minutes 3x a week, drink plenty of water I will not give Zoloft without seeing patient Additional Follow-up by: Milinda Antis MD,  December 31, 2009 12:18 PM     Appended Document: Rx Req gave her above advice & scheduled her with pcp on Tuesday

## 2010-11-17 NOTE — Letter (Signed)
Summary: Lipid Letter  [facility]  [address]   [place], [place] [address]   Phone: [phone]  Fax: [phone]    09/26/2010  [name] [address] [place]. [place], [place]  [address]  Dear [name]:  We have carefully reviewed your last lipid profile from 09/23/2010 and the results are noted below with a summary of recommendations for lipid management.    Cholesterol:       222     Goal: < 200   HDL "good" Cholesterol:   62     Goal: > 40   LDL "bad" Cholesterol:   143     Goal: < 100   Triglycerides:       83     Goal: < 150     Your thyroid tests were normal. Your basic metabolic test- which looks at your electrolytes (Sodium, potassium) and kidney function were normal. Your diabetes screen was normal.      Current Medications: 1)    Bayer Childrens Aspirin 81 Mg Chew (Aspirin) .... Take 1 tablet by mouth once a day 2)    Hydrochlorothiazide 25 Mg Tabs (Hydrochlorothiazide) .... Take 1 tablet by mouth once a day 3)    Metoprolol Tartrate 50 Mg Tabs (Metoprolol tartrate) .... Take 1 tablet by mouth twice a day 4)    Loratadine 10 Mg Tabs (Loratadine) .... Take one tablet daily as needed for allergies 5)    Proventil Hfa 108 (90 Base) Mcg/act Aers (Albuterol sulfate) .... Inhale 1-2 puff every 4-6 hours as needed shortness of breath if this is not covered under insurance for albuterol please provide what is 6)    Triamcinolone Acetonide 0.1 % Crea With Eucerin 1:1  .... Apply two times a day to rash for 10 days. 7)    Fish Oil 1000 Mg Caps (Omega-3 fatty acids) .Marland Kitchen.. 1 by mouth two times a day 8)    Diflucan 150 Mg Tabs (Fluconazole) .... Take 1 tablet now , may repeat in 3 days if needed 9)    Simvastatin 20 Mg Tabs (Simvastatin) .Marland Kitchen.. 1 by mouth daily for cholesterol  Your cholesterol is higher than recommended for patients with High blood pressure. Your bad cholesterol is increasing even though you are taking fish oil. I have sent a a new medication that  lower cholesterol called Simvastatin. Please take 1 tablet at bedtime. We will recheck your cholesterol in 6 months.   If you have any questions, please call. We appreciate being able to work with you.   Sincerely,    [facility] Milinda Antis MD  Appended Document: Lipid Letter mailed

## 2010-11-23 NOTE — Miscellaneous (Signed)
Summary: Referral  Clinical Lists Changes  Orders: Added new Referral order of Sports Medicine (Sports Med) - Signed

## 2010-11-28 ENCOUNTER — Other Ambulatory Visit: Payer: Self-pay | Admitting: Family Medicine

## 2010-11-28 ENCOUNTER — Telehealth: Payer: Self-pay | Admitting: Family Medicine

## 2010-11-28 DIAGNOSIS — Z1231 Encounter for screening mammogram for malignant neoplasm of breast: Secondary | ICD-10-CM

## 2010-11-28 NOTE — Telephone Encounter (Signed)
Patient was treated for UTI back in January.  Is now having the same symptoms.  Told her that typically we would bring her back in for a UA but I did not have any appts today.  Advised her to go to an [facility].  Patient agreeable.

## 2010-12-05 ENCOUNTER — Other Ambulatory Visit: Payer: Self-pay | Admitting: Family Medicine

## 2010-12-05 DIAGNOSIS — I10 Essential (primary) hypertension: Secondary | ICD-10-CM

## 2010-12-05 MED ORDER — HYDROCHLOROTHIAZIDE 25 MG PO TABS: 25.0000 mg | ORAL_TABLET | Freq: Every day | ORAL | Status: DC

## 2010-12-05 NOTE — Telephone Encounter (Signed)
Called into [facility]

## 2010-12-06 ENCOUNTER — Ambulatory Visit (INDEPENDENT_AMBULATORY_CARE_PROVIDER_SITE_OTHER): Payer: Self-pay | Admitting: Family Medicine

## 2010-12-06 ENCOUNTER — Encounter: Payer: Self-pay | Admitting: Family Medicine

## 2010-12-06 ENCOUNTER — Ambulatory Visit: Payer: Self-pay | Admitting: Family Medicine

## 2010-12-06 VITALS — BP 152/92 | HR 78 | Temp 98.4°F | Ht 62.0 in | Wt 231.8 lb

## 2010-12-06 DIAGNOSIS — J039 Acute tonsillitis, unspecified: Secondary | ICD-10-CM

## 2010-12-06 MED ORDER — AMOXICILLIN 875 MG PO TABS: 875.0000 mg | ORAL_TABLET | Freq: Two times a day (BID) | ORAL | Status: AC

## 2010-12-06 NOTE — Assessment & Plan Note (Addendum)
Pt has tonsillitis that may be viral but since there is also purulent fluid behind her ears, ear pain, a pus in the tonsils I plan to treat with antibiotics.

## 2010-12-06 NOTE — Patient Instructions (Signed)
It looks like you have a tonsillitis.  Take the antibiotic twice daily for 10 days.  Continue to use over the counter cough and cold medicine as needed or cough drops as needed.

## 2010-12-06 NOTE — Progress Notes (Signed)
  Subjective:    Patient ID: [name], female    DOB: March 23, 1956, 55 y.o.   MRN: [id number]  HPI Tonsillitis: Pt comes in with 3 days of sore throat, congestion, fevers, chills, sneezing and sick contacts (daugther was sick last week). She is having some left ear pain and a little cough with green phlegm. She rested yesterday and used some cough drops but has not used any other medicine.    Review of Systems  All other systems reviewed and are negative.       Objective:   Physical Exam  Constitutional: She appears well-developed and well-nourished. No distress.  HENT:  Head: Normocephalic and atraumatic.       Pus in tonsils Left > Rt, erythema and enlargement in bilateral tonsils. Bilateral ears have some fluid behind the TM  Eyes: EOM are normal. Pupils are equal, round, and reactive to light.  Neck: Normal range of motion.  Cardiovascular: Normal rate and regular rhythm.   Pulmonary/Chest: Effort normal and breath sounds normal. No respiratory distress.  Lymphadenopathy:    She has cervical adenopathy.          Assessment & Plan:

## 2010-12-09 ENCOUNTER — Encounter (HOSPITAL_COMMUNITY): Payer: Self-pay

## 2010-12-09 ENCOUNTER — Ambulatory Visit (HOSPITAL_COMMUNITY)
Admission: RE | Admit: 2010-12-09 | Discharge: 2010-12-09 | Disposition: A | Discharge status: Home / Self Care | Payer: Self-pay | Source: Ambulatory Visit | Attending: *Deleted | Admitting: *Deleted

## 2010-12-09 DIAGNOSIS — Z1231 Encounter for screening mammogram for malignant neoplasm of breast: Secondary | ICD-10-CM | POA: Insufficient documentation

## 2010-12-13 ENCOUNTER — Encounter: Payer: Self-pay | Admitting: Family Medicine

## 2010-12-13 ENCOUNTER — Ambulatory Visit (INDEPENDENT_AMBULATORY_CARE_PROVIDER_SITE_OTHER): Payer: Self-pay | Admitting: Family Medicine

## 2010-12-13 DIAGNOSIS — M79609 Pain in unspecified limb: Secondary | ICD-10-CM | POA: Insufficient documentation

## 2010-12-13 DIAGNOSIS — M7660 Achilles tendinitis, unspecified leg: Secondary | ICD-10-CM

## 2010-12-22 NOTE — Assessment & Plan Note (Signed)
Summary: HEEL PAIN FOR BOTH FEET/PER DR. Kieler/NP/LP   Vital Signs:  Patient profile:   55 year old female Pulse rate:   67 / minute BP sitting:   137 / 83  (left arm)  Vitals Entered By: Lillia Pauls CMA (December 13, 2010 3:20 PM) CC: knot on AT Bilat x 22month   History of Present Illness: 55 year old female with a history of increased activity doing high impact aerobics with minimal prior physical activity.  Now with posterior B achilles nodules and posterior pain only  no trauma.  Taken some ibuprofen with minimal relief.  Allergies: 1)  ! Zocor (Simvastatin)  Past History:  Past medical, surgical, family and social histories (including risk factors) reviewed, and no changes noted (except as noted below).  Past Medical History: Reviewed history from 05/08/2007 and no changes required. Subclinical Hyperthyroidism : TSH low, T3 and T4 normal HTN Obesity  Past Surgical History: Reviewed history from 08/23/2007 and no changes required. No surgeries  Family History: Reviewed history from 01/04/2010 and no changes required. Breast CA - paternal grandmother, Diabetes- father, Thyroid disease (goiter) - mother, Brother - mentally challenged, Son and brother had rickets Father had some kind of stomach cancer, bowel????  Grandmother - CVAs, Mom CVA Mother/SIster- depression  Social History: Reviewed history from 01/04/2010 and no changes required. 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 Husband now working in [place]. They are talking now but still separated. . Daughter goes to [employer]. Son about to go to school.   Review of Systems       REVIEW OF SYSTEMS  GEN: No systemic complaints, no fevers, chills, sweats, or other acute illnesses MSK: Detailed in the HPI GI: tolerating PO intake without difficulty Neuro: No numbness, parasthesias, or tingling  associated. Otherwise the pertinent positives of the ROS are noted above.    Physical Exam  General:  GEN: Well-developed,well-nourished,in no acute distress; alert,appropriate and cooperative throughout examination HEENT: Normocephalic and atraumatic without obvious abnormalities. No apparent alopecia or balding. Ears, externally no deformities PULM: Breathing comfortably in no respiratory distress EXT: No clubbing, cyanosis, or edema PSYCH: Normally interactive. Cooperative during the interview. Pleasant. Friendly and conversant. Not anxious or depressed appearing. Normal, full affect.  Msk:  B FEET Echymosis: no Edema: no ROM: full LE B Gait: heel toe, non-antalgic, pronates MT pain: no Callus pattern: none Lateral Mall: NT Medial Mall: NT Talus: NT Navicular: NT Cuboid: NT Calcaneous: NT Metatarsals: NT 5th MT: NT Phalanges: NT Achilles: TTP with palpable nodule present, mild tenderness at insertion Plantar Fascia: NT Fat Pad: NT Peroneals: NT Post Tib: NT Great Toe: Nml motion Ant Drawer: neg ATFL: NT CFL: NT Deltoid: NT Other foot breakdown: none Long arch: moderate breakdown Transverse arch: moderate breakdown Hindfoot breakdown: minimal / none Sensation: intact    Impression & Recommendations:  Problem # 1:  ACHILLES BURSITIS OR TENDINITIS (ICD-726.71) Pathophysiology of achilles tendinopathy reviewed.  Additionally, I have given the patient the program emphasizing eccentric overloading detailed in the instructions based on Dr. Renato Gails work and protocols.  Placed in Tuli's heel cups. NTG patches have been demonstrated to encourage blood flow in chronic tendinopathy, diminish pain, and encourage remodeling of tendon to become more normal.  clinical diagnosis. advanced imaging not needed and will not alter POC. Pretest probability exceedingly high and I have minimal question regarding diagnosis.  f/u 5-6 weeks  Problem # 2:  HEEL PAIN  (  ICD-729.5)  Orders: Heel Cup [phone])  Complete Medication List: 1)  Bayer Childrens Aspirin 81 Mg Chew (Aspirin) .... Take 1 tablet by mouth once a day 2)  Hydrochlorothiazide 25 Mg Tabs (Hydrochlorothiazide) .... Take 1 tablet by mouth once a day 3)  Metoprolol Tartrate 50 Mg Tabs (Metoprolol tartrate) .... Take 1 tablet by mouth twice a day 4)  Loratadine 10 Mg Tabs (Loratadine) .... Take one tablet daily as needed for allergies 5)  Proventil Hfa 108 (90 Base) Mcg/act Aers (Albuterol sulfate) .... Inhale 1-2 puff every 4-6 hours as needed shortness of breath if this is not covered under insurance for albuterol please provide what is 6)  Triamcinolone Acetonide 0.1 % Crea With Eucerin 1:1  .... Apply two times a day to rash for 10 days. 7)  Fish Oil 1000 Mg Caps (Omega-3 fatty acids) .Marland Kitchen.. 1 by mouth three times a day 8)  Keflex 500 Mg Caps (Cephalexin) .Marland Kitchen.. 1 by mouth two times a day x 3 days 9)  Pyridium 100 Mg Tabs (Phenazopyridine hcl) .Marland Kitchen.. 1 tab by mouth three times a day x 2 days 10)  Nitroglycerin 0.1 Mg/hr Pt24 (Nitroglycerin) .... Apply 1/4 of a patch to b heels at the bump daily  Patient Instructions: 1)  Achilles Rehab 2)  Begin with easy walking, heel, toe and backwards 3)  Calf raises on a step 4)  First lower and then raise on 1 foot 5)  If this is painful lower on 1 foot but do the heel raise on both feet 6)  Begin with 3 sets of 10 repetitions 7)  Increase by 5 repetitions every 3 days 8)  Goal is 3 sets of 30 repetitions 9)  Do with both knee straight and knee at 20 degrees of flexion 10)  If pain persists at 3 sets of 30 - add backpack with 5 lbs 11)  Increase by 5 lbs per week to max of 30 lbs  Prescriptions: NITROGLYCERIN 0.1 MG/HR PT24 (NITROGLYCERIN) apply 1/4 of a patch to B heels at the bump daily  #16 x 2   Entered and Authorized by:   Hannah Beat MD   Signed by:   Hannah Beat MD on 12/13/2010   Method used:   Electronically to        [facility] [phone]* (retail)       [address]       [place], [place]  [address]       Ph: [phone]       Fax: [phone]   RxID:   [id number]    Orders Added: 1)  Heel Cup [L3649] 2)  Est. Patient Level IV [42595]

## 2011-01-17 ENCOUNTER — Ambulatory Visit (INDEPENDENT_AMBULATORY_CARE_PROVIDER_SITE_OTHER): Payer: Self-pay | Admitting: Family Medicine

## 2011-01-17 VITALS — BP 135/96

## 2011-01-17 DIAGNOSIS — M7660 Achilles tendinitis, unspecified leg: Secondary | ICD-10-CM

## 2011-01-17 NOTE — Patient Instructions (Signed)
ALTERNATE: WEAR A 1/4 OF A PATCH ON YOUR HEEL FOR 24 HOURS A DAY, THEN THE NEXT DAY, SWITCH TO THE OTHER HEEL.  F/u 6 weeks

## 2011-01-17 NOTE — Progress Notes (Signed)
55 yo f/u achilles tendinopathy, B, doing better. 30% improvement. Using Heel cups, every other day compliant with HEP. Had some HA, lightheadedness with NTG patches, wearing QOD at night.  REVIEW OF SYSTEMS  GEN: No fevers, chills. Nontoxic. Primarily MSK c/o today. MSK: Detailed in the HPI GI: tolerating PO intake without difficulty Neuro: No numbness, parasthesias, or tingling associated. Otherwise the pertinent positives of the ROS are noted above.    GEN: Well-developed,well-nourished,in no acute distress; alert,appropriate and cooperative throughout examination HEENT: Normocephalic and atraumatic without obvious abnormalities. Ears, externally no deformities PULM: Breathing comfortably in no respiratory distress EXT: No clubbing, cyanosis, or edema PSYCH: Normally interactive. Cooperative during the interview. Pleasant. Friendly and conversant. Not anxious or depressed appearing. Normal, full affect.  FEET: Echymosis: no Edema: no ROM: full LE B Gait: heel toe, non-antalgic MT pain: no Callus pattern: none Lateral Mall: NT Medial Mall: NT Talus: NT Navicular: NT Cuboid: NT Calcaneous: NT Metatarsals: NT 5th MT: NT Phalanges: NT Achilles: B nodules, just prox to insertion Plantar Fascia: NT Fat Pad: NT Peroneals: NT Post Tib: NT Great Toe: Nml motion Ant Drawer: neg ATFL: NT CFL: NT Deltoid: NT Sensation: intact  A/P: achilles tendinopathy, improving, change NTG to one foot every other day, encouraged about improvement.  Diagnostic Ultrasound Evaluation [employer] E, MSK ultrasound, MSK probe Anatomy scanned: B achilles Indication: Pain Findings: congruous tendon seen with increased fluid at nodular area, with increased volume and great color flow doppler on R compared to L

## 2011-01-31 ENCOUNTER — Other Ambulatory Visit: Payer: Self-pay | Admitting: Family Medicine

## 2011-01-31 MED ORDER — METOPROLOL TARTRATE 50 MG PO TABS: 50.0000 mg | ORAL_TABLET | Freq: Two times a day (BID) | ORAL | Status: DC

## 2011-02-02 ENCOUNTER — Telehealth: Payer: Self-pay | Admitting: Family Medicine

## 2011-02-02 NOTE — Telephone Encounter (Signed)
Pt checking status of rx for metoprolol, called pharmacy last week.

## 2011-02-02 NOTE — Telephone Encounter (Signed)
Called in Rx to pharmacy, patient notified.Cloteal Isaacson, Rodena Medin

## 2011-02-21 ENCOUNTER — Ambulatory Visit (INDEPENDENT_AMBULATORY_CARE_PROVIDER_SITE_OTHER): Payer: Self-pay | Admitting: Family Medicine

## 2011-02-21 DIAGNOSIS — M79609 Pain in unspecified limb: Secondary | ICD-10-CM

## 2011-02-21 DIAGNOSIS — M7660 Achilles tendinitis, unspecified leg: Secondary | ICD-10-CM

## 2011-02-22 NOTE — Assessment & Plan Note (Signed)
-   Improving bilateral achilles tendinopathy.  Rt achilles measuring 0.75cm & Lt measuring 0.80cm on MSK u/s today with increased doppler flow & increased neovessels - Cont. NTG protocol - 1/4 patch on each heel as long as not having headache or other adverse effects - Cont achilles exercises - Cont to wear heel cups - Follow-up 6-weeks for re-evaluation

## 2011-02-22 NOTE — Progress Notes (Signed)
  Subjective:    Patient ID: [name], female    DOB: 03-03-1956, 55 y.o.   MRN: [id number]  HPI 54yo female to office for f/u of bilateral achilles tendinopathy.  Feels about 30% improved from last visit.  Still using NTG patches - started using 1/4 patch on each heel.  Denies any HAs or other side effects - on NTG for about 10-weeks.  Doing achilles exercises regularly.  Continues to wear heel cups.  Walked 3-miles yesterday - some pain after walk.  Overall she is pleased with her progress.   Review of Systems Per HPI, otherwise negative    Objective:   Physical Exam GEN: AOx3, NAD SKIN: no rashes or lesions MSK: - Foot/ankle: NTG patch in place over left achilles.  Mild swelling noted over achilles 3-4 cm proximal from calcaneus, palpable tender nodule in this area.  FROM left ankle without pain, good strength.  Able to do heel raise on left foot without significant pain.  Neg Thompson test.  Rt achilles with NTG in place.  Moderate swelling noted over achilles 3-4 cm proximal to calcaneus, palpable nodule noted that is very tender.  FROM right ankle without pain, good strength.  Able to do heel raise on right foot with minimal pain. Walking without a limp. Neurovascularly intact distally  MSK U/S:  - Lt achilles measuring 0.80cm in diameter at point 4cm proximal to insertion on calcaneus.  Increased doppler flow & neo-vessels noted. - Rt achilles measuring 0.75cm in diameter at point 4cm proximal to insertion on calcaneus.  Increased doppler flow & neo-vessels noted. - All images saved.       Assessment & Plan:

## 2011-02-22 NOTE — Assessment & Plan Note (Signed)
-   Improving bilateral achilles tendiopathy. - Plan as outlined above

## 2011-03-03 NOTE — Cardiovascular Report (Signed)
NAMECARYS, [name]               ACCOUNT NO.:  [id number]   MEDICAL RECORD NO.:  [id number]          PATIENT TYPE:  OIB   LOCATION:  2899                         FACILITY:  [facility]   PHYSICIAN:  Ricki Rodriguez, M.D.  DATE OF BIRTH:  02/01/1956   DATE OF PROCEDURE:  04/09/2006  DATE OF DISCHARGE:  04/09/2006                              CARDIAC CATHETERIZATION   PROCEDURES:  1.  Left heart catheterization.  2.  Selective coronary angiography.  3.  Left ventricular function study.  4.  Ascending aortography.   INDICATIONS:  This 55 year old black female had recurrent chest pain and  abnormal stress test with a persistent ST depression in inferolateral leads.   APPROACH:  Right femoral artery using 4-French sheath and catheters.   COMPLICATIONS:  None.   CONTRAST:  120 cc of dye was used.   HEMODYNAMIC DATA:  The left ventricular pressure was 140x11 and aortic  pressure was 131x81.   LEFT VENTRICULOGRAM:  The left ventriculogram showed hyperdynamic wall  motion with ejection fraction of 70%.   ASCENDING AORTOGRAM:  The ascending aortogram was unremarkable.  It showed a  left coronary artery system but did not show any right coronary artery  origin.   CORONARY ANATOMY:  The left main coronary anatomy was double barrelled.  LEFT ANTERIOR DESCENDING CORONARY ARTERY:  The left anterior descending  coronary artery was unremarkable and its diagonal branch 1, 2 and 3 were  also unremarkable.  LEFT CIRCUMFLEX CORONARY ARTERY:  The left circumflex coronary artery was  essentially unremarkable.  It was dominant.  Had a smaller obtuse marginal  branch 1 and 2 and a larger obtuse marginal branch 3.  His posterolateral  and posterior descending coronary arteries were unremarkable.  RIGHT CORONARY ARTERY:  Right coronary artery was not visualized.   IMPRESSION:  1.  Normal coronaries with left circumflex dominant.  2.  Absent right coronary artery.   RECOMMENDATIONS:  This patient  will be treated medically and may undergo  evaluation for noncardiac chest pain.      Ricki Rodriguez, M.D.  Electronically Signed     ASK/MEDQ  D:  04/09/2006  T:  04/10/2006  Job:  [id number]

## 2011-04-04 ENCOUNTER — Ambulatory Visit: Payer: Self-pay | Admitting: Family Medicine

## 2011-06-07 ENCOUNTER — Other Ambulatory Visit: Payer: Self-pay | Admitting: Family Medicine

## 2011-06-07 MED ORDER — LORATADINE 10 MG PO TABS: 10.0000 mg | ORAL_TABLET | Freq: Every day | ORAL | Status: DC

## 2011-08-09 ENCOUNTER — Other Ambulatory Visit: Payer: Self-pay | Admitting: Family Medicine

## 2011-08-09 DIAGNOSIS — I10 Essential (primary) hypertension: Secondary | ICD-10-CM

## 2011-08-09 MED ORDER — HYDROCHLOROTHIAZIDE 25 MG PO TABS: 25.0000 mg | ORAL_TABLET | Freq: Every day | ORAL | Status: DC

## 2011-08-19 ENCOUNTER — Inpatient Hospital Stay (INDEPENDENT_AMBULATORY_CARE_PROVIDER_SITE_OTHER)
Admission: RE | Admit: 2011-08-19 | Discharge: 2011-08-19 | Disposition: A | Discharge status: Home / Self Care | Payer: Self-pay | Source: Ambulatory Visit | Attending: Emergency Medicine | Admitting: Emergency Medicine

## 2011-08-19 DIAGNOSIS — M5430 Sciatica, unspecified side: Secondary | ICD-10-CM

## 2011-08-24 ENCOUNTER — Ambulatory Visit: Payer: Self-pay | Admitting: Family Medicine

## 2011-08-24 ENCOUNTER — Ambulatory Visit (INDEPENDENT_AMBULATORY_CARE_PROVIDER_SITE_OTHER): Payer: Self-pay | Admitting: Family Medicine

## 2011-08-24 ENCOUNTER — Encounter: Payer: Self-pay | Admitting: Family Medicine

## 2011-08-24 VITALS — BP 183/108 | HR 80 | Temp 97.6°F | Ht 62.0 in | Wt 230.0 lb

## 2011-08-24 DIAGNOSIS — M25559 Pain in unspecified hip: Secondary | ICD-10-CM

## 2011-08-24 DIAGNOSIS — M79605 Pain in left leg: Secondary | ICD-10-CM

## 2011-08-24 DIAGNOSIS — M25552 Pain in left hip: Secondary | ICD-10-CM | POA: Insufficient documentation

## 2011-08-24 DIAGNOSIS — M545 Low back pain: Secondary | ICD-10-CM

## 2011-08-24 MED ORDER — MELOXICAM 15 MG PO TABS: 15.0000 mg | ORAL_TABLET | Freq: Every day | ORAL | Status: DC

## 2011-08-24 MED ORDER — PREDNISONE (PAK) 10 MG PO TABS: 10.0000 mg | ORAL_TABLET | Freq: Every day | ORAL | Status: AC

## 2011-08-24 MED ORDER — CYCLOBENZAPRINE HCL 10 MG PO TABS: 10.0000 mg | ORAL_TABLET | Freq: Three times a day (TID) | ORAL | Status: AC | PRN

## 2011-08-24 NOTE — Assessment & Plan Note (Signed)
patient's pain is diffuse and started following a workout routine with pain lateral hip, buttock, and groin.  Also associated with symptoms of sciatica.  Doubt lumbar radiculopathy though treatment initially would be similar.  Start with prednisone, transition to mobic.  Flexeril as needed for muscle spasms.  She does not currently have insurance - used to have [facility] coverage and is going to reapply.  When/if this goes through advised to call us and we will order PT for her.  In meantime given handouts with stretches/exercises for both lower back and left hip musculature.

## 2011-08-24 NOTE — Progress Notes (Signed)
Subjective:    Patient ID: [name], female    DOB: [birth year], 55 y.o.   MRN: [id number]  PCP: [facility]  HPI 55 yo F here for left leg pain.  Patient denies known acute injury. However, pain started approximately on 10/15 after she finished doing a hip-hop abs workout. Felt soreness and pain develop left anterior hip/groin moving laterally and in left buttock as well. No associated numbness or tingling. Does have back pain. No bowel/bladder dysfunction. No prior workup of back. Went to ED and given ibuprofen + hydrocodone.  Did not like the way hydrocodone made her feel. Pain worse with left leg motions and also radiates down posterior and lateral left leg to knee.  Past Medical History  Diagnosis Date  . Hypertension     Current Outpatient Prescriptions on File Prior to Visit  Medication Sig Dispense Refill  . aspirin 81 MG chewable tablet Chew 81 mg by mouth daily.        . hydrochlorothiazide (HYDRODIURIL) 25 MG tablet Take 1 tablet (25 mg total) by mouth daily.  60 tablet  0  . metoprolol (LOPRESSOR) 50 MG tablet Take 1 tablet (50 mg total) by mouth 2 (two) times daily.  60 tablet  6  . simvastatin (ZOCOR) 20 MG tablet Take 20 mg by mouth daily.          History reviewed. No pertinent past surgical history.  Allergies  Allergen Reactions  . Simvastatin     REACTION: muscle aches, GI complaints    History   Social History  . Marital Status: Single    Spouse Name: N/A    Number of Children: N/A  . Years of Education: N/A   Occupational History  . Not on file.   Social History Main Topics  . Smoking status: Never Smoker   . Smokeless tobacco: Not on file  . Alcohol Use: Not on file  . Drug Use: Not on file  . Sexually Active: Not on file   Other Topics Concern  . Not on file   Social History Narrative  . No narrative on file    Family History  Problem Relation Age of Onset  . Hypertension Mother   . Diabetes Father   . Hyperlipidemia Brother     . Heart attack Neg Hx   . Sudden death Neg Hx     BP 183/108  Pulse 80  Temp(Src) 97.6 F (36.4 C) (Oral)  Ht 5\' 2"  (1.575 m)  Wt 230 lb (104.327 kg)  BMI 42.07 kg/m2  Review of Systems See HPI above.    Objective:   Physical Exam Gen: NAD Back: No gross deformity, scoliosis. TTP midline and left > right paraspinal lumbar regions, left buttock, SI joint.  No focal bony TTP or stepoffs.  TTP left greater trochanter, diffusely across lateral hip and anterior groin. FROM with pain on flexion > extension. Strength hip flexion 4/5 on left, 4/5 knee extension, 4/5 hip abduction.  Otherwise strength LEs 5/5 all muscle groups.   Trace MSRs in patellar and achilles tendons, equal bilaterally. + SLR on left, negative on right. Sensation intact to light touch bilaterally. Negative logroll bilateral hips - pain in lateral left hip, buttock with this on left side. Negative fabers and piriformis stretches - pain lateral hip across groin.    Assessment & Plan:  1. Left hip pain - patient's pain is diffuse and started following a workout routine with pain lateral hip, buttock, and groin.  Also associated with  symptoms of sciatica.  Doubt lumbar radiculopathy though treatment initially would be similar.  Start with prednisone, transition to mobic.  Flexeril as needed for muscle spasms.  She does not currently have insurance - used to have [facility] coverage and is going to reapply.  When/if this goes through advised to call us and we will order PT for her.  In meantime given handouts with stretches/exercises for both lower back and left hip musculature.

## 2011-08-24 NOTE — Patient Instructions (Signed)
Take tylenol for baseline pain relief (1-2 extra strength tabs 3x/day) A prednisone dose pack is the best option for immediate relief and may be prescribed with transition to an anti-inflammatory like aleve or meloxicam (if you do not have stomach or kidney issues). Flexeril as needed for muscle spasms (no driving on this medicine if it makes you sleepy). Stay as active as possible. Physical therapy has been shown to be helpful as well - if/when the [facility] stuff goes through, call us and we will order this for you. When going to physical therapy make sure you do the home exercises they show you as well.

## 2011-09-20 ENCOUNTER — Ambulatory Visit (INDEPENDENT_AMBULATORY_CARE_PROVIDER_SITE_OTHER): Payer: Self-pay | Admitting: Family Medicine

## 2011-09-20 ENCOUNTER — Other Ambulatory Visit (HOSPITAL_COMMUNITY)
Admission: RE | Admit: 2011-09-20 | Discharge: 2011-09-20 | Disposition: A | Discharge status: Home / Self Care | Payer: Self-pay | Source: Ambulatory Visit | Attending: Family Medicine | Admitting: Family Medicine

## 2011-09-20 ENCOUNTER — Encounter: Payer: Self-pay | Admitting: Family Medicine

## 2011-09-20 VITALS — BP 131/95 | HR 76 | Ht 62.0 in | Wt 222.0 lb

## 2011-09-20 DIAGNOSIS — Z0000 Encounter for general adult medical examination without abnormal findings: Secondary | ICD-10-CM | POA: Insufficient documentation

## 2011-09-20 DIAGNOSIS — E0590 Thyrotoxicosis, unspecified without thyrotoxic crisis or storm: Secondary | ICD-10-CM

## 2011-09-20 DIAGNOSIS — Z01419 Encounter for gynecological examination (general) (routine) without abnormal findings: Secondary | ICD-10-CM | POA: Insufficient documentation

## 2011-09-20 DIAGNOSIS — Z124 Encounter for screening for malignant neoplasm of cervix: Secondary | ICD-10-CM

## 2011-09-20 DIAGNOSIS — N898 Other specified noninflammatory disorders of vagina: Secondary | ICD-10-CM

## 2011-09-20 DIAGNOSIS — E010 Iodine-deficiency related diffuse (endemic) goiter: Secondary | ICD-10-CM

## 2011-09-20 DIAGNOSIS — I10 Essential (primary) hypertension: Secondary | ICD-10-CM

## 2011-09-20 DIAGNOSIS — E049 Nontoxic goiter, unspecified: Secondary | ICD-10-CM

## 2011-09-20 DIAGNOSIS — E785 Hyperlipidemia, unspecified: Secondary | ICD-10-CM

## 2011-09-20 DIAGNOSIS — Z23 Encounter for immunization: Secondary | ICD-10-CM

## 2011-09-20 LAB — POCT WET PREP (WET MOUNT)
Clue Cells Wet Prep HPF POC: NEGATIVE
Trichomonas Wet Prep HPF POC: NEGATIVE
Yeast Wet Prep HPF POC: NEGATIVE

## 2011-09-20 NOTE — Assessment & Plan Note (Signed)
Rechecking lipid profile today.

## 2011-09-20 NOTE — Assessment & Plan Note (Signed)
Wet prep today 

## 2011-09-20 NOTE — Patient Instructions (Signed)

## 2011-09-20 NOTE — Assessment & Plan Note (Signed)
Otherwise well controlled. Will check Cr and K today.

## 2011-09-20 NOTE — Assessment & Plan Note (Signed)
Annual thyroid checks. Will recheck TFTs today.

## 2011-09-20 NOTE — Progress Notes (Signed)
  Subjective:    Patient ID: [name], female    DOB: 06/12/1956, 55 y.o.   MRN: [id number]  HPI Pt is here for annual examw  Sciatica: Pt has been recently seen at [facility] for L hip pain and related sciatic pain. Pt has received steroid burst for this. Pt is currently taking mobic for this and is pending PT. Pain has been overall stable. Pt has had some breakthrough pain with this intermittently.   Vaginal Discharge: Pt reports vaginal discharge x 2-3 weeks. Pt states that she has a history of BV in the past that she has been treated for. Discharge is similar to previous episodes of BV. No hx/o STDs. Pt states that she is intermittently sexually active with her husband (decreased sexual activity because of prostate issues per pt).      Review of Systems See HPI     Objective:   Physical Exam Gen: up in chair, NAD, obese  HEENT: NCAT, EOMI, TMs clear bilaterally CV: RRR, no murmurs auscultated PULM: CTAB, no wheezes, rales, rhoncii ABD: S/NT/+ bowel sounds  GU: normal external genitalia, mild vaginal discharge, no CMT EXT: 2+ peripheral pulses MSK: Mild TTP in L lumbar region    Assessment & Plan:

## 2011-09-20 NOTE — Assessment & Plan Note (Signed)
Pap today. Otherwise normal annual exam. Will follow up in 1 year.

## 2011-09-21 LAB — COMPREHENSIVE METABOLIC PANEL
ALT: 18 U/L (ref 0–35)
AST: 18 U/L (ref 0–37)
Albumin: 4.3 g/dL (ref 3.5–5.2)
Alkaline Phosphatase: 92 U/L (ref 39–117)
Calcium: 10.8 mg/dL — ABNORMAL HIGH (ref 8.4–10.5)
Chloride: 105 mEq/L (ref 96–112)
Potassium: 4 mEq/L (ref 3.5–5.3)
Sodium: 141 mEq/L (ref 135–145)

## 2011-09-21 LAB — LIPID PANEL
Cholesterol: 216 mg/dL — ABNORMAL HIGH (ref 0–200)
HDL: 61 mg/dL (ref >39)
LDL Cholesterol: 137 mg/dL — ABNORMAL HIGH (ref 0–99)
Total CHOL/HDL Ratio: 3.5 Ratio
Triglycerides: 89 mg/dL (ref <150)
VLDL: 18 mg/dL (ref 0–40)

## 2011-09-21 LAB — T4, FREE: Free T4: 1.09 ng/dL (ref 0.80–1.80)

## 2011-09-21 LAB — T3, FREE: T3, Free: 2.9 pg/mL (ref 2.3–4.2)

## 2011-10-03 ENCOUNTER — Ambulatory Visit: Payer: Self-pay | Admitting: Physical Therapy

## 2011-10-03 ENCOUNTER — Ambulatory Visit: Payer: Self-pay | Attending: Family Medicine | Admitting: Physical Therapy

## 2011-10-03 DIAGNOSIS — M25569 Pain in unspecified knee: Secondary | ICD-10-CM | POA: Insufficient documentation

## 2011-10-03 DIAGNOSIS — M25559 Pain in unspecified hip: Secondary | ICD-10-CM | POA: Insufficient documentation

## 2011-10-03 DIAGNOSIS — M545 Low back pain, unspecified: Secondary | ICD-10-CM | POA: Insufficient documentation

## 2011-10-03 DIAGNOSIS — IMO0001 Reserved for inherently not codable concepts without codable children: Secondary | ICD-10-CM | POA: Insufficient documentation

## 2011-10-03 DIAGNOSIS — R293 Abnormal posture: Secondary | ICD-10-CM | POA: Insufficient documentation

## 2011-10-04 ENCOUNTER — Ambulatory Visit: Payer: Self-pay | Admitting: Rehabilitation

## 2011-10-18 ENCOUNTER — Ambulatory Visit: Payer: Self-pay | Attending: Family Medicine | Admitting: Physical Therapy

## 2011-10-18 DIAGNOSIS — M25569 Pain in unspecified knee: Secondary | ICD-10-CM | POA: Insufficient documentation

## 2011-10-18 DIAGNOSIS — M545 Low back pain, unspecified: Secondary | ICD-10-CM | POA: Insufficient documentation

## 2011-10-18 DIAGNOSIS — R293 Abnormal posture: Secondary | ICD-10-CM | POA: Insufficient documentation

## 2011-10-18 DIAGNOSIS — M25559 Pain in unspecified hip: Secondary | ICD-10-CM | POA: Insufficient documentation

## 2011-10-18 DIAGNOSIS — IMO0001 Reserved for inherently not codable concepts without codable children: Secondary | ICD-10-CM | POA: Insufficient documentation

## 2011-10-20 ENCOUNTER — Ambulatory Visit: Payer: Self-pay | Admitting: Physical Therapy

## 2011-10-24 ENCOUNTER — Ambulatory Visit: Payer: Self-pay | Admitting: Physical Therapy

## 2011-10-27 ENCOUNTER — Ambulatory Visit: Payer: Self-pay | Admitting: Physical Therapy

## 2011-11-21 ENCOUNTER — Other Ambulatory Visit: Payer: Self-pay | Admitting: Family Medicine

## 2011-12-04 ENCOUNTER — Encounter: Payer: Self-pay | Admitting: Family Medicine

## 2011-12-08 ENCOUNTER — Other Ambulatory Visit: Payer: Self-pay | Admitting: Family Medicine

## 2011-12-08 DIAGNOSIS — Z1231 Encounter for screening mammogram for malignant neoplasm of breast: Secondary | ICD-10-CM

## 2011-12-21 ENCOUNTER — Encounter: Payer: Self-pay | Admitting: Family Medicine

## 2011-12-21 ENCOUNTER — Ambulatory Visit (INDEPENDENT_AMBULATORY_CARE_PROVIDER_SITE_OTHER): Payer: Self-pay | Admitting: Family Medicine

## 2011-12-21 DIAGNOSIS — E0590 Thyrotoxicosis, unspecified without thyrotoxic crisis or storm: Secondary | ICD-10-CM

## 2011-12-21 DIAGNOSIS — E039 Hypothyroidism, unspecified: Secondary | ICD-10-CM

## 2011-12-21 DIAGNOSIS — R5383 Other fatigue: Secondary | ICD-10-CM

## 2011-12-21 DIAGNOSIS — R5381 Other malaise: Secondary | ICD-10-CM

## 2011-12-21 DIAGNOSIS — E038 Other specified hypothyroidism: Secondary | ICD-10-CM

## 2011-12-21 DIAGNOSIS — F341 Dysthymic disorder: Secondary | ICD-10-CM

## 2011-12-21 LAB — COMPREHENSIVE METABOLIC PANEL
AST: 13 U/L (ref 0–37)
Albumin: 4.2 g/dL (ref 3.5–5.2)
BUN: 7 mg/dL (ref 6–23)
Calcium: 10.2 mg/dL (ref 8.4–10.5)
Chloride: 106 mEq/L (ref 96–112)
Creat: 0.84 mg/dL (ref 0.50–1.10)
Glucose, Bld: 91 mg/dL (ref 70–99)

## 2011-12-21 LAB — CBC
HCT: 38.6 % (ref 36.0–46.0)
Hemoglobin: 12 g/dL (ref 12.0–15.0)
MCHC: 31.1 g/dL (ref 30.0–36.0)
MCV: 88.7 fL (ref 78.0–100.0)
RDW: 13.6 % (ref 11.5–15.5)

## 2011-12-21 NOTE — Patient Instructions (Signed)
It was good to see today I would like to check some labs to assess for any medical or metabolic reasons for your fatigue I will contact you've any of your results are abnormal Be sure to followup with me in 2-3 weeks Call if any questions God Bless, Doree Albee MD

## 2011-12-22 DIAGNOSIS — F341 Dysthymic disorder: Secondary | ICD-10-CM | POA: Insufficient documentation

## 2011-12-22 NOTE — Assessment & Plan Note (Signed)
Relatively broad differential for this with some concern for organic causes in setting of subclinical hyperthyroidism. Will check TFTs. Will check PHQ-9. Plan to reassess in 2-3 weeks.

## 2011-12-22 NOTE — Assessment & Plan Note (Signed)
Checking TFTs.

## 2011-12-22 NOTE — Progress Notes (Signed)
  Subjective:    Patient ID: [name], female    DOB: 1956-05-10, 55 y.o.   MRN: [id number]  HPI Patient presents today for general followup in the setting of subclinical hyperthyroidism. Patient reports he she's had generally depressed mood over the last 3-4 months. Patient denies history of depression. Patient states that general decreased interest in daily activities as well as interest in her previously enjoyed pains. No recent family losses or traumatic events. The patient does report she is now taking care of baby that she adopted. Sleep has been stable. This has been somewhat stressful but also very rewarding. No homicidal or suicidal ideations. Appetite has been decreased.   Review of Systems See HPI, otherwise ROS negative     Objective:   Physical Exam Gen: up in chair, NAD HEENT: NCAT, EOMI, TMs clear bilaterally CV: RRR, no murmurs auscultated PULM: CTAB, no wheezes, rales, rhoncii ABD: S/NT/+ bowel sounds  EXT: 2+ peripheral pulses   Assessment & Plan:

## 2012-01-02 ENCOUNTER — Ambulatory Visit (HOSPITAL_COMMUNITY)
Admission: RE | Admit: 2012-01-02 | Discharge: 2012-01-02 | Disposition: A | Discharge status: Home / Self Care | Payer: Self-pay | Source: Ambulatory Visit | Attending: Family Medicine | Admitting: Family Medicine

## 2012-01-02 DIAGNOSIS — Z1231 Encounter for screening mammogram for malignant neoplasm of breast: Secondary | ICD-10-CM | POA: Insufficient documentation

## 2012-01-05 ENCOUNTER — Encounter: Payer: Self-pay | Admitting: Family Medicine

## 2012-01-11 ENCOUNTER — Other Ambulatory Visit: Payer: Self-pay | Admitting: Family Medicine

## 2012-01-23 ENCOUNTER — Ambulatory Visit (HOSPITAL_COMMUNITY)
Admission: RE | Admit: 2012-01-23 | Discharge: 2012-01-23 | Disposition: A | Discharge status: Home / Self Care | Payer: Self-pay | Source: Ambulatory Visit | Attending: Family Medicine | Admitting: Family Medicine

## 2012-01-23 ENCOUNTER — Ambulatory Visit (INDEPENDENT_AMBULATORY_CARE_PROVIDER_SITE_OTHER): Payer: Self-pay | Admitting: Family Medicine

## 2012-01-23 VITALS — BP 151/97

## 2012-01-23 DIAGNOSIS — IMO0002 Reserved for concepts with insufficient information to code with codable children: Secondary | ICD-10-CM | POA: Insufficient documentation

## 2012-01-23 DIAGNOSIS — M5416 Radiculopathy, lumbar region: Secondary | ICD-10-CM

## 2012-01-23 DIAGNOSIS — M7061 Trochanteric bursitis, right hip: Secondary | ICD-10-CM

## 2012-01-23 DIAGNOSIS — M25559 Pain in unspecified hip: Secondary | ICD-10-CM

## 2012-01-23 DIAGNOSIS — M25552 Pain in left hip: Secondary | ICD-10-CM

## 2012-01-23 DIAGNOSIS — M7062 Trochanteric bursitis, left hip: Secondary | ICD-10-CM

## 2012-01-23 DIAGNOSIS — M76899 Other specified enthesopathies of unspecified lower limb, excluding foot: Secondary | ICD-10-CM

## 2012-01-23 MED ORDER — DICLOFENAC SODIUM 75 MG PO TBEC: 75.0000 mg | DELAYED_RELEASE_TABLET | Freq: Two times a day (BID) | ORAL | Status: DC

## 2012-01-23 NOTE — Patient Instructions (Signed)
Hip Rehab:  Hip Flexion: Toe up to ceiling, laying on your back. Lift your whole leg, 3 sets. Work up to being able to do #30 with each set.  Hip elevations, Toe and leg turned out to side.  Lift whole leg, 3 sets. Work up to being able to do #30 with each set. 

## 2012-01-23 NOTE — Progress Notes (Signed)
Patient Name: [name] Date of Birth: Feb 15, 1956 Medical Record Number: [id number] Gender: female Date of Encounter: 01/23/2012  History of Present Illness:  [name] is a 56 y.o. very pleasant female patient who presents with the following:  Very pleasant patient who I saw previously when she had some Achilles tendinopathy presents with some left-sided lower back pain, buttocks pain, and bilateral lateral hip pain. She is also having some radiating burning, tingling, and radiculopathy down the LEFT side posteriorly and laterally all the way down to her foot. She initially hurt herself about 6 months ago when she was doing a dance class combination abdominal class.  She has been on one  Sterapred dosepak, and has taken some Mobic as well as some Aleve, and had some Flexeril. She also had some Vicodin, but only took a few of those as it made her drowsy and did not feel well.  She also did go to some formal physical therapy for 1-2 months around December or January.  She tries to do some of her rehabilitation exercises, but is mild to moderately compliant. She does do his stretching class at the [facility], but sometimes this will make it hurt even more.  Today, she complains mostly of pain at her lateral hips.  Patient Active Problem List  Diagnoses  . THYROMEGALY  . HYPERTHYROIDISM, SUBCLINICAL  . HYPERLIPIDEMIA  . OBESITY, NOS  . UNSPECIFIED EPISODIC MOOD DISORDER  . HYPERTENSION, BENIGN ESSENTIAL  . ALLERGIC RHINITIS, SEASONAL  . Achilles bursitis or tendinitis  . HEEL PAIN  . Left hip pain  . Annual physical exam  . Vaginal Discharge  . Dysthymia   Past Medical History  Diagnosis Date  . Hypertension    No past surgical history on file. History  Substance Use Topics  . Smoking status: Never Smoker   . Smokeless tobacco: Not on file  . Alcohol Use: No   Family History  Problem Relation Age of Onset  . Hypertension Mother   . Diabetes Father   .  Hyperlipidemia Brother   . Heart attack Neg Hx   . Sudden death Neg Hx    Allergies  Allergen Reactions  . Simvastatin     REACTION: muscle aches, GI complaints    Medication list has been reviewed and updated.  Review of Systems:  GEN: No fevers, chills. Nontoxic. Primarily MSK c/o today. MSK: Detailed in the HPI GI: tolerating PO intake without difficulty Neuro: detailed above Otherwise the pertinent positives of the ROS are noted above.   Physical Examination: Filed Vitals:   01/23/12 1537  BP: 151/97    There is no height or weight on file to calculate BMI.   GEN: Well-developed,well-nourished,in no acute distress; alert,appropriate and cooperative throughout examination HEENT: Normocephalic and atraumatic without obvious abnormalities. Ears, externally no deformities PULM: Breathing comfortably in no respiratory distress EXT: No clubbing, cyanosis, or edema PSYCH: Normally interactive. Cooperative during the interview. Pleasant. Friendly and conversant. Not anxious or depressed appearing. Normal, full affect.  Range of motion at  the waist: Flexion, extension, lateral bending and rotation:   No echymosis or edema Rises to examination table with mild difficulty Gait: minimally antalgic  Inspection/Deformity: N Paraspinus Tenderness: l4-s1 B  B Ankle Dorsiflexion (L5,4): 5/5 B Great Toe Dorsiflexion (L5,4): 5/5 Heel Walk (L5): WNL Toe Walk (S1): WNL Rise/Squat (L4): WNL, mild pain  SENSORY B Medial Foot (L4): WNL B Dorsum (L5): WNL B Lateral (S1): WNL Light Touch: WNL  REFLEXES Knee (L4): 2+ Ankle (  S1): 2+  B SLR, seated: pos L B SLR, supine: pos L RIGHT hip with a negative log roll and normal range of motion with internal and external rotation. LEFT hip with pain posteriorly laterally and anteriorly with internal and external rotation with the hip flexed to 90. B Greater Troch: markedly tender  B B Stork: NT B Sciatic Notch: NT   Assessment and  Plan:  1. Hip pain, left  diclofenac (VOLTAREN) 75 MG EC tablet, DG Hip Complete Left  2. Trochanteric bursitis of left hip  diclofenac (VOLTAREN) 75 MG EC tablet  3. Trochanteric bursitis of right hip  diclofenac (VOLTAREN) 75 MG EC tablet  4. Lumbar radiculopathy  diclofenac (VOLTAREN) 75 MG EC tablet, DG Lumbar Spine Complete   I suspect primary problem is related to nerve encroachment causing some radiculopathy of the LEFT.  Obtained plain films.  Very significant trochanteric bursitis. Bilateral trochanteric bUrsa injections. The 80 mg of Depo-Medrol may help with her back symptoms as well. Also reviewed the basic rehabilitation program with her Recheck 4 weeks  Trochanteric Bursitis Injection, R Verbal consent obtained. Risks (including infection, potential atrophy), benefits, and alternatives reviewed. Greater trochanter sterilely prepped with Chloraprep. Ethyl Chloride used for anesthesia. 5 cc of Lidocaine 1% injected with 1 cc of 40 mg Depo-Medrol into trochanteric bursa at area of maximal tenderness at greater trochanter. Needle taken to bone to troch bursa, flows easily. Bursa massaged. No bleeding and no complications. Decreased pain after injection. Needle: 22 gauge spinal needle  Trochanteric Bursitis Injection, L Verbal consent obtained. Risks (including infection, potential atrophy), benefits, and alternatives reviewed. Greater trochanter sterilely prepped with Chloraprep. Ethyl Chloride used for anesthesia. 5 cc of Lidocaine 1% injected with 1 cc of 40 mg Depo-Medrol into trochanteric bursa at area of maximal tenderness at greater trochanter. Needle taken to bone to troch bursa, flows easily. Bursa massaged. No bleeding and no complications. Decreased pain after injection. Needle: 22 gauge spinal needle =  Orders Today: Orders Placed This Encounter  Procedures  . DG Hip Complete Left    Standing Status: Future     Number of Occurrences: 1     Standing Expiration Date:  03/24/2013    Order Specific Question:  Preferred imaging location?    Answer:  [facility]    Order Specific Question:  Reason for exam:    Answer:  L hip pain, lumbar radiculopathy  . DG Lumbar Spine Complete    Standing Status: Future     Number of Occurrences: 1     Standing Expiration Date: 03/24/2013    Order Specific Question:  Preferred imaging location?    Answer:  [facility]    Order Specific Question:  Reason for exam:    Answer:  lumbar radiculopathy    Medications Today: Meds ordered this encounter  Medications  . diclofenac (VOLTAREN) 75 MG EC tablet    Sig: Take 1 tablet (75 mg total) by mouth 2 (two) times daily.    Dispense:  60 tablet    Refill:  3

## 2012-01-24 ENCOUNTER — Telehealth: Payer: Self-pay | Admitting: *Deleted

## 2012-01-24 NOTE — Telephone Encounter (Signed)
Discussed x-ray results with pt- per Dr. Patsy Lager.

## 2012-01-24 NOTE — Telephone Encounter (Signed)
Message copied by Mora Bellman on Wed Jan 24, 2012  9:58 AM ------      Message from: Hannah Beat      Created: Wed Jan 24, 2012  9:32 AM       Please call

## 2012-01-30 ENCOUNTER — Encounter: Payer: Self-pay | Admitting: Family Medicine

## 2012-01-30 ENCOUNTER — Ambulatory Visit (INDEPENDENT_AMBULATORY_CARE_PROVIDER_SITE_OTHER): Payer: Self-pay | Admitting: Family Medicine

## 2012-01-30 VITALS — BP 124/80 | HR 74 | Ht 62.0 in | Wt 212.0 lb

## 2012-01-30 DIAGNOSIS — E785 Hyperlipidemia, unspecified: Secondary | ICD-10-CM

## 2012-01-30 DIAGNOSIS — I10 Essential (primary) hypertension: Secondary | ICD-10-CM

## 2012-01-30 DIAGNOSIS — F341 Dysthymic disorder: Secondary | ICD-10-CM

## 2012-01-30 DIAGNOSIS — E0590 Thyrotoxicosis, unspecified without thyrotoxic crisis or storm: Secondary | ICD-10-CM

## 2012-01-30 DIAGNOSIS — E669 Obesity, unspecified: Secondary | ICD-10-CM

## 2012-01-30 LAB — POCT GLYCOSYLATED HEMOGLOBIN (HGB A1C): Hemoglobin A1C: 5.3

## 2012-01-30 MED ORDER — PRAVASTATIN SODIUM 40 MG PO TABS: 40.0000 mg | ORAL_TABLET | Freq: Every day | ORAL | Status: DC

## 2012-01-30 NOTE — Progress Notes (Signed)
  Subjective:    Patient ID: [name], female    DOB: March 21, 1956, 56 y.o.   MRN: [id number]  HPI Pt presents today for general followup visit. Patient was recent seen for subclinical hypothyroidism as well as generalized dysthymia. TFTs were obtained which were essentially within normal limits. Today patient states that mood is overall much improved. Patient was seen at the sports medicine Center for recurrent low back pain and was found had gender changes in the low back and hip. Patient states that pain has improved since his visit. Patient states she's taking meloxicam for pain but does notice that the medication does make her feel: "Funny". Sxs with medication include dizziness and blurred vision per pt. No CP, LOC, SOB.   HTN:  Checking BPS Daily ?:no BP Range:n/a Any HA, CP, SOB?:no Any Medication Side Effects:no Medication Compliance:yes Exercise?:yes Weight Loss?:yes       Review of Systems See HPI, otherwise ROS negative     Objective:   Physical Exam Gen: up in chair, NAD, obese  HEENT: NCAT, EOMI, TMs clear bilaterally CV: RRR, no murmurs auscultated PULM: CTAB, no wheezes, rales, rhoncii ABD: S/NT/+ bowel sounds  EXT: 2+ peripheral pulses    Assessment & Plan:

## 2012-01-30 NOTE — Patient Instructions (Signed)

## 2012-01-31 NOTE — Assessment & Plan Note (Signed)
Most recent TFTs WNL. Will follow up in 6-12 months.

## 2012-01-31 NOTE — Assessment & Plan Note (Signed)
Clinically improved. No psych red flags currently. Will continue to otherwise follow.

## 2012-01-31 NOTE — Assessment & Plan Note (Signed)
Well controlled. Will continue with current regimen.

## 2012-02-16 ENCOUNTER — Telehealth: Payer: Self-pay | Admitting: *Deleted

## 2012-02-16 NOTE — Telephone Encounter (Signed)
i agree. 

## 2012-02-16 NOTE — Telephone Encounter (Signed)
Spoke with pt- she states she is in significant low back and left leg pain.  She has been taking the diclofenac, but this makes her "loopy", has even tried taking 1/2 tab.  States Dr. Alvester Morin has suggested that she try tylenol arthritis- she has been taking this, and stopped diclofenac, but her pain persists.  She asked if she can take aleve or advil with tylenol arthritis, discussed with Dr. Karie Schwalbe- he advised she can take aleve OR advil along with tylenol arthritis, but she should NOT take diclofenac along with aleve or advil.  Pt expressed understanding.  Scheduled her for a f/u appt 02/20/12 with Dr. Patsy Lager.

## 2012-02-16 NOTE — Telephone Encounter (Signed)
Called pt left VM.

## 2012-02-16 NOTE — Telephone Encounter (Signed)
Message copied by Mora Bellman on Fri Feb 16, 2012 11:33 AM ------      Message from: Lizbeth Bark      Created: Fri Feb 16, 2012 10:41 AM      Regarding: phone message      Contact: [phone]       This is the patient I talked to you about earlier, regarding wanting pain management

## 2012-02-20 ENCOUNTER — Ambulatory Visit (INDEPENDENT_AMBULATORY_CARE_PROVIDER_SITE_OTHER): Payer: Self-pay | Admitting: Family Medicine

## 2012-02-20 ENCOUNTER — Encounter: Payer: Self-pay | Admitting: Family Medicine

## 2012-02-20 VITALS — BP 135/85

## 2012-02-20 DIAGNOSIS — M1610 Unilateral primary osteoarthritis, unspecified hip: Secondary | ICD-10-CM

## 2012-02-20 DIAGNOSIS — IMO0002 Reserved for concepts with insufficient information to code with codable children: Secondary | ICD-10-CM

## 2012-02-20 DIAGNOSIS — M5416 Radiculopathy, lumbar region: Secondary | ICD-10-CM

## 2012-02-20 MED ORDER — AMITRIPTYLINE HCL 25 MG PO TABS: 25.0000 mg | ORAL_TABLET | Freq: Every day | ORAL | Status: DC

## 2012-02-20 MED ORDER — MELOXICAM 15 MG PO TABS: 15.0000 mg | ORAL_TABLET | Freq: Every day | ORAL | Status: DC

## 2012-02-20 NOTE — Patient Instructions (Signed)
Recheck 3-4 weeks

## 2012-02-23 DIAGNOSIS — M1610 Unilateral primary osteoarthritis, unspecified hip: Secondary | ICD-10-CM | POA: Insufficient documentation

## 2012-02-23 NOTE — Progress Notes (Addendum)
Patient Name: [name] Date of Birth: 1956/02/01 Age: 56 y.o. Medical Record Number: [id number] Gender: female Date of Encounter: 02/20/2012  History of Present Illness:  [name] is a 56 y.o. very pleasant female patient who presents with the following:  This is a patient with left-sided hip pain as well as some pain going down her LEFT leg. She is not having any focal numbness or weakness.  On her last office visit, we placed her on a oral prednisone taper, and muscle relaxants, and also started some range of motion. At this point, she is not really any better at all.  She points to her hip as her source of primary pain. 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 She also has some facet arthropathy in her back as well as a very mild anterolisthesis.  Past Medical History, Surgical History, Social History, Family History, Problem List, Medications, and Allergies have been reviewed and updated if relevant.  Review of Systems:  GEN: No fevers, chills. Nontoxic. Primarily MSK c/o today. MSK: Detailed in the HPI GI: tolerating PO intake without difficulty Neuro: detailed above Otherwise the pertinent positives of the ROS are noted above.    Physical Examination: Filed Vitals:   02/20/12 1450  BP: 135/85    There is no height or weight on file to calculate BMI.   GEN: WDWN, NAD, Non-toxic, Alert & Oriented x 3 HEENT: Atraumatic, Normocephalic.  Ears and Nose: No external deformity. EXTR: No clubbing/cyanosis/edema NEURO: Normal gait.  PSYCH: Normally interactive. Conversant. Not depressed or anxious appearing.  Calm demeanor.   HIP EXAM: SIDE: B ROM: Abduction, Flexion, Internal and External range of motion: approximately 45 of internal and external rotation achieved. There is marked tenderness and pain with internal rotation, which the patient notes reproduces the  symptoms and she is feeling down her leg and thigh. Pain with terminal IROM and EROM: yes, L GTB: NT SLR: NEG Knees: No effusion FABER: NT Piriformis: NT at direct palpation Str: flexion: 4+/5 abduction: 4+/5 adduction: 4+/5 Strength testing non-tender     Assessment and Plan:  1. Hip arthritis   2. Lumbar radiculopathy    Discontinue diclofenac. The patient did not feel very well when she took this. Start Mobic daily. 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  Diagnostic and therapeutic intra-articular hip injection, ultrasound-guided. Not clear if this is her primary pain source from intra-articular hip pathology versus from her back. This would be very helpful from a diagnostic standpoint.  Diagnostic Ultrasound Evalution: [employer] E, MSK ultrasound, MSK probe Anatomy scanned: Left anterior hip Indication: Pain Findings: The anterior joint capsule is visualized, and the femoral neck as well as the edge of the acetabulum. The patient completed a verbal and written consent for ultrasound-guided intra-articular hip injection. Complications were reviewed with the patient including potential rare infection, failure to improve her symptoms, pain and bleeding. She wished to proceed. Prepped with Betadine. 5 cc of lidocaine 1% used with a spinal needle under ultrasound guidance to anesthetize the approach. Done without difficulty. Under U/S guidance a 22-gauge 3-1/2 inch spinal needle used to approach the joint capsule, and a mixture of 5 cc of lidocaine 1% and 1 cc of Depo-Medrol 40 mg was injected into the intra-articular space. Pre-and post images were stored. No complications, and the patient felt better with  a relief of her symptoms after injection.   Orders Today: No orders of the defined types were placed in this encounter.    Medications Today: Meds ordered this encounter  Medications  .  meloxicam (MOBIC) 15 MG tablet    Sig: Take 1 tablet (15 mg total) by mouth daily.    Dispense:  30 tablet    Refill:  2  . amitriptyline (ELAVIL) 25 MG tablet    Sig: Take 1 tablet (25 mg total) by mouth at bedtime.    Dispense:  30 tablet    Refill:  1

## 2012-03-01 ENCOUNTER — Ambulatory Visit (INDEPENDENT_AMBULATORY_CARE_PROVIDER_SITE_OTHER): Payer: Self-pay | Admitting: Family Medicine

## 2012-03-01 ENCOUNTER — Encounter: Payer: Self-pay | Admitting: Family Medicine

## 2012-03-01 VITALS — BP 132/84 | HR 76 | Ht 62.0 in | Wt 215.0 lb

## 2012-03-01 DIAGNOSIS — M1610 Unilateral primary osteoarthritis, unspecified hip: Secondary | ICD-10-CM

## 2012-03-01 DIAGNOSIS — I10 Essential (primary) hypertension: Secondary | ICD-10-CM

## 2012-03-01 DIAGNOSIS — E785 Hyperlipidemia, unspecified: Secondary | ICD-10-CM

## 2012-03-01 NOTE — Progress Notes (Signed)
Subjective:  Pt here for chronic problem follow up:  Hip pain: Recently seen at [facility] for hip pain. Dxd with hip arthritis. S/p u/s guided hip steroid injection. Was also rxd elavil at night.  Hip symptomatically improved. Having some mild radicular sxs on R side.  Has not used elavil to date. Concerned about side effects.   Hypertension:  Checking BPS Daily ?:no BP Range:unknown Any HA, CP, SOB?:no Any Medication Side Effects:yes Medication Compliance:yes Salt intake?:low per pt Exercise?:no Weight Loss?:no  HLD: Rxd pravachol for HLD.  Lab Results  Component Value Date   LDLCALC 137* 09/20/2011   Has not taken pravacahol 2/2 hx/o zocor causing musle pain.         Review of Systems - Negative except as noted above in HPI.   Objective:  Current Outpatient Prescriptions  Medication Sig Dispense Refill  . amitriptyline (ELAVIL) 25 MG tablet Take 1 tablet (25 mg total) by mouth at bedtime.  30 tablet  1  . aspirin 81 MG chewable tablet Chew 81 mg by mouth daily.        . cyclobenzaprine (FLEXERIL) 10 MG tablet Take 1 tablet (10 mg total) by mouth every 8 (eight) hours as needed for muscle spasms.  60 tablet  1  . hydrochlorothiazide (HYDRODIURIL) 25 MG tablet Take 1 tablet (25 mg total) by mouth daily.  60 tablet  0  . meloxicam (MOBIC) 15 MG tablet Take 1 tablet (15 mg total) by mouth daily.  30 tablet  2  . metoprolol (LOPRESSOR) 50 MG tablet Take 1 tablet (50 mg total) by mouth 2 (two) times daily.  60 tablet  6  . pravastatin (PRAVACHOL) 40 MG tablet Take 1 tablet (40 mg total) by mouth daily.  90 tablet  3    Wt Readings from Last 3 Encounters:  03/01/12 215 lb (97.523 kg)  01/30/12 212 lb (96.163 kg)  12/21/11 211 lb 4.8 oz (95.845 kg)   Temp Readings from Last 3 Encounters:  08/24/11 97.6 F (36.4 C) Oral  12/06/10 98.4 F (36.9 C) Oral   BP Readings from Last 3 Encounters:  03/01/12 132/84  02/20/12 135/85  01/30/12 124/80   Pulse Readings from Last 3  Encounters:  03/01/12 76  01/30/12 74  12/21/11 84    General: alert, cooperative and moderately obese HEENT: PERRLA and extra ocular movement intact Heart: S1, S2 normal, no murmur, rub or gallop, regular rate and rhythm Lungs: clear to auscultation, no wheezes or rales and unlabored breathing Abdomen: abdomen is soft without significant tenderness, masses, organomegaly or guarding Extremities: extremities normal, atraumatic, no cyanosis or edema Skin:no rashes Neurology: normal without focal findings   Assessment/Plan:

## 2012-03-01 NOTE — Patient Instructions (Signed)

## 2012-03-03 NOTE — Assessment & Plan Note (Signed)
Well controlled. Will continue current regimen.

## 2012-03-03 NOTE — Assessment & Plan Note (Signed)
Mildly improved. Discussed compliance with elavil. Discussed this helping radicular pain. Pt agreeable to this.

## 2012-03-03 NOTE — Assessment & Plan Note (Signed)
Discussed side effect profile of statin family as well as trial of pravachol. Discussed that pravachol is relatively mild in comparison to other statins. Discussed that if muscle cramps recur, to call.

## 2012-03-05 ENCOUNTER — Ambulatory Visit: Payer: Self-pay | Admitting: Family Medicine

## 2012-03-12 ENCOUNTER — Ambulatory Visit: Payer: Self-pay | Admitting: Family Medicine

## 2012-03-12 ENCOUNTER — Ambulatory Visit (INDEPENDENT_AMBULATORY_CARE_PROVIDER_SITE_OTHER): Payer: Self-pay | Admitting: Family Medicine

## 2012-03-12 VITALS — BP 155/91

## 2012-03-12 DIAGNOSIS — M1610 Unilateral primary osteoarthritis, unspecified hip: Secondary | ICD-10-CM

## 2012-03-13 NOTE — Progress Notes (Signed)
  Patient Name: [name] Date of Birth: October 01, 1956 Medical Record Number: [id number] Gender: female Date of Encounter: 03/12/2012  History of Present Illness:  [name] is a 56 y.o. very pleasant female patient who presents with the following:  The patient is doing great from a hip and back standpoint. She has not had any hip pain at all now. Her back is hurting at all she has not had any radicular symptoms. She is status post intra-articular hip injection with ultrasound guidance on her last office visit.  Patient Active Problem List  Diagnoses  . THYROMEGALY  . HYPERTHYROIDISM, SUBCLINICAL  . HYPERLIPIDEMIA  . OBESITY, NOS  . UNSPECIFIED EPISODIC MOOD DISORDER  . HYPERTENSION, BENIGN ESSENTIAL  . ALLERGIC RHINITIS, SEASONAL  . Annual physical exam  . Vaginal Discharge  . Dysthymia  . Hip arthritis   Past Medical History  Diagnosis Date  . Hypertension    No past surgical history on file. History  Substance Use Topics  . Smoking status: Never Smoker   . Smokeless tobacco: Not on file  . Alcohol Use: No   Family History  Problem Relation Age of Onset  . Hypertension Mother   . Diabetes Father   . Hyperlipidemia Brother   . Heart attack Neg Hx   . Sudden death Neg Hx    Allergies  Allergen Reactions  . Simvastatin     REACTION: muscle aches, GI complaints    Medication list has been reviewed and updated.  Review of Systems:  GEN: No fevers, chills. Nontoxic. Primarily MSK c/o today. MSK: Detailed in the HPI GI: tolerating PO intake without difficulty Neuro: No numbness, parasthesias, or tingling associated. Otherwise the pertinent positives of the ROS are noted above.    Physical Examination: Filed Vitals:   03/12/12 1549  BP: 155/91    There is no height or weight on file to calculate BMI.   GEN: WDWN, NAD, Non-toxic, Alert & Oriented x 3 HEENT: Atraumatic, Normocephalic.  Ears and Nose: No external deformity. EXTR: No  clubbing/cyanosis/edema NEURO: Normal gait.  PSYCH: Normally interactive. Conversant. Not depressed or anxious appearing.  Calm demeanor.   Back: Nontender to palpation, full flexion, extension lateral bending and rotation.  Hip with no significant tenderness with abduction, internal and external rotation. Negative CT scan. Nontender to trochanter bursa is.  Assessment and Plan: 1. Hip arthritis     Much improved. At this point, she can only follow up on a when necessary basis. I did review her films with her face to face, and she does have some moderately severe hip arthritis on the left

## 2012-03-27 ENCOUNTER — Other Ambulatory Visit: Payer: Self-pay | Admitting: Family Medicine

## 2012-03-28 ENCOUNTER — Other Ambulatory Visit: Payer: Self-pay | Admitting: Family Medicine

## 2012-03-29 ENCOUNTER — Other Ambulatory Visit: Payer: Self-pay | Admitting: Family Medicine

## 2012-04-01 ENCOUNTER — Telehealth: Payer: Self-pay | Admitting: Family Medicine

## 2012-04-01 NOTE — Telephone Encounter (Signed)
Patient has schedule and appt for 6/24, but needs enough Metoprolol to last until that appt time.  She uses [facility] [address].  Please call her when completed.

## 2012-04-03 NOTE — Telephone Encounter (Signed)
Patient is calling back to check on her message from 2 days ago.  She said she is leaving town today and was hoping to have some meds to take with her.

## 2012-04-04 MED ORDER — METOPROLOL TARTRATE 50 MG PO TABS: 50.0000 mg | ORAL_TABLET | Freq: Two times a day (BID) | ORAL | Status: DC

## 2012-04-04 NOTE — Telephone Encounter (Signed)
Meds sent in.  Please call pt to let her know.

## 2012-04-04 NOTE — Telephone Encounter (Signed)
Patient notified Rx sent to pharmacy.Steve Gregg, Rodena Medin

## 2012-04-08 ENCOUNTER — Ambulatory Visit (INDEPENDENT_AMBULATORY_CARE_PROVIDER_SITE_OTHER): Payer: Self-pay | Admitting: Family Medicine

## 2012-04-08 VITALS — BP 151/82 | HR 105 | Ht 62.0 in | Wt 218.0 lb

## 2012-04-08 DIAGNOSIS — M549 Dorsalgia, unspecified: Secondary | ICD-10-CM

## 2012-04-08 MED ORDER — METOPROLOL TARTRATE 50 MG PO TABS: 50.0000 mg | ORAL_TABLET | Freq: Two times a day (BID) | ORAL | Status: DC

## 2012-04-08 MED ORDER — TRAMADOL-ACETAMINOPHEN 37.5-325 MG PO TABS: 1.0000 | ORAL_TABLET | Freq: Four times a day (QID) | ORAL | Status: AC | PRN

## 2012-04-08 NOTE — Patient Instructions (Addendum)

## 2012-04-16 DIAGNOSIS — M549 Dorsalgia, unspecified: Secondary | ICD-10-CM | POA: Insufficient documentation

## 2012-04-16 NOTE — Assessment & Plan Note (Addendum)
Acute on chronic issue.  Will start pt on ultracet for breakthrough pain.  Formal referral to PT for general strengthening.  Handout given.

## 2012-04-16 NOTE — Progress Notes (Signed)
  Subjective:    Patient ID: [name], female    DOB: 1956-07-27, 56 y.o.   MRN: [id number]  HPI Pt presents today with chief complaint of back pain.  This has been a recurrent issue. Pain is predominantly in lumbar region.  Has had some intermittent radicular sxs.  No bowel or bladder incontinence.  Also with baseline hx/o hip arthritis. Seen for this by sports medicine.  Currently on elavil for pain modulation.  This medication has not been effective in managing pain.  Has been using otc NSAIDs and tylenol with mild improvement in sxs.    Review of Systems     Objective:   Physical Exam Gen: up in chair, NAD HEENT: NCAT, EOMI, TMs clear bilaterally CV: RRR, no murmurs auscultated PULM: CTAB, no wheezes, rales, rhoncii ABD: S/NT/+ bowel sounds  EXT: 2+ peripheral pulses MSK: + TTP in lumbar region, distal extremities neurovascularly intact.     Assessment & Plan:

## 2012-04-22 ENCOUNTER — Ambulatory Visit: Payer: Self-pay | Attending: Family Medicine

## 2012-04-22 DIAGNOSIS — M545 Low back pain, unspecified: Secondary | ICD-10-CM | POA: Insufficient documentation

## 2012-04-22 DIAGNOSIS — M25559 Pain in unspecified hip: Secondary | ICD-10-CM | POA: Insufficient documentation

## 2012-04-22 DIAGNOSIS — M25659 Stiffness of unspecified hip, not elsewhere classified: Secondary | ICD-10-CM | POA: Insufficient documentation

## 2012-04-22 DIAGNOSIS — IMO0001 Reserved for inherently not codable concepts without codable children: Secondary | ICD-10-CM | POA: Insufficient documentation

## 2012-04-29 ENCOUNTER — Ambulatory Visit: Payer: Self-pay | Admitting: Rehabilitation

## 2012-05-01 ENCOUNTER — Ambulatory Visit (INDEPENDENT_AMBULATORY_CARE_PROVIDER_SITE_OTHER): Payer: Self-pay | Admitting: Family Medicine

## 2012-05-01 ENCOUNTER — Encounter: Payer: Self-pay | Admitting: Family Medicine

## 2012-05-01 ENCOUNTER — Ambulatory Visit: Payer: Self-pay | Admitting: Rehabilitation

## 2012-05-01 VITALS — BP 157/92 | HR 74 | Ht 62.0 in | Wt 215.0 lb

## 2012-05-01 DIAGNOSIS — I10 Essential (primary) hypertension: Secondary | ICD-10-CM

## 2012-05-01 DIAGNOSIS — S025XXA Fracture of tooth (traumatic), initial encounter for closed fracture: Secondary | ICD-10-CM | POA: Insufficient documentation

## 2012-05-01 DIAGNOSIS — K0381 Cracked tooth: Secondary | ICD-10-CM

## 2012-05-01 DIAGNOSIS — M1610 Unilateral primary osteoarthritis, unspecified hip: Secondary | ICD-10-CM

## 2012-05-01 MED ORDER — IBUPROFEN 800 MG PO TABS: 800.0000 mg | ORAL_TABLET | Freq: Two times a day (BID) | ORAL | Status: AC | PRN

## 2012-05-01 MED ORDER — IBUPROFEN 800 MG PO TABS: 800.0000 mg | ORAL_TABLET | Freq: Two times a day (BID) | ORAL | Status: DC | PRN

## 2012-05-01 MED ORDER — TRAMADOL HCL 50 MG PO TABS: 50.0000 mg | ORAL_TABLET | Freq: Two times a day (BID) | ORAL | Status: AC | PRN

## 2012-05-01 NOTE — Assessment & Plan Note (Signed)
BP Readings from Last 3 Encounters:  05/01/12 157/92  04/08/12 151/82  03/12/12 155/91   I did not have a chance to address this problem today. However it is clear that her blood pressures been poorly controlled in the past and needs attention in the future.

## 2012-05-01 NOTE — Progress Notes (Signed)
  Subjective:    Patient ID: [name], female    DOB: [birth year], 56 y.o.   MRN: [id number]  HPI  Tooth problem - Front right tooth with crack. This was previously fixed by a dentist but has spontaneously returned. It is bothersome to the patient. However it is nonpainful. Is not associated with fever or swelling of the gum.  Hip Arthritis - Previously diagnosed with hip arthritis; Treated in Sports Medicine clinic with injection in May 2013; Was also prescribed ultracet medication by Dr. Alvester Morin and would like something cheaper; Has also used 800 mg ibuprofen with relief; Is now going to the [facility] for stretching and also going to physical therapy; Would like handicap parking pass for disabling hip pain  Review of Systems  All other systems reviewed and are negative.       Objective:   Physical Exam  HENT:  Mouth/Throat: Oropharynx is clear and moist and mucous membranes are normal.    Musculoskeletal:       Right hip: She exhibits normal range of motion, normal strength and no tenderness.       Left hip: She exhibits decreased strength, tenderness and bony tenderness.  Neurological:  Reflex Scores:      Patellar reflexes are 2+ on the right side and 2+ on the left side.  BP 157/92  Pulse 74  Ht 5\' 2"  (1.575 m)  Wt 215 lb (97.523 kg)  BMI 39.32 kg/m2      Assessment & Plan:

## 2012-05-01 NOTE — Assessment & Plan Note (Signed)
The patient will continue to take Elavil at night, tramadol the morning before workouts, and ibuprofen in the afternoon after workouts if her hip is still sore. Additionally she will continue with physical therapy and personal exercise the [facility]. I agreed to a temporary handicap parking pass, however the patient did not have a form with her today.

## 2012-05-01 NOTE — Assessment & Plan Note (Signed)
The patient has a fracture of the upper right central tooth. She would like this repaired for cosmetic purposes. Therefore I will refer her to a dentist.

## 2012-05-01 NOTE — Patient Instructions (Addendum)
Dear Mrs. Thurlow,   Thank you for coming to clinic today. Please read below regarding the issues that we discussed.   1. Tooth - I will put in a referral to the dentist.  2. Hip Pain - Keep up your routine. It sounds great. Use the tramadol and ibuprofen as needed. If you can no longer do your daily activities, then I will refer you back to Sports Medicine.   Please follow up in clinic in 12 weeks . Please call earlier if you have any questions or concerns.   Sincerely,   Dr. Clinton Sawyer

## 2012-05-06 ENCOUNTER — Ambulatory Visit: Payer: Self-pay | Admitting: Rehabilitation

## 2012-05-08 ENCOUNTER — Ambulatory Visit: Payer: Self-pay | Admitting: Rehabilitation

## 2012-05-13 ENCOUNTER — Ambulatory Visit: Payer: Self-pay | Admitting: Rehabilitative and Restorative Service Providers"

## 2012-05-15 ENCOUNTER — Ambulatory Visit: Payer: Self-pay | Admitting: Rehabilitative and Restorative Service Providers"

## 2012-05-20 ENCOUNTER — Ambulatory Visit: Payer: Self-pay | Attending: Family Medicine | Admitting: Rehabilitation

## 2012-05-20 DIAGNOSIS — M545 Low back pain, unspecified: Secondary | ICD-10-CM | POA: Insufficient documentation

## 2012-05-20 DIAGNOSIS — M25659 Stiffness of unspecified hip, not elsewhere classified: Secondary | ICD-10-CM | POA: Insufficient documentation

## 2012-05-20 DIAGNOSIS — M25559 Pain in unspecified hip: Secondary | ICD-10-CM | POA: Insufficient documentation

## 2012-05-20 DIAGNOSIS — IMO0001 Reserved for inherently not codable concepts without codable children: Secondary | ICD-10-CM | POA: Insufficient documentation

## 2012-05-22 ENCOUNTER — Encounter: Payer: Self-pay | Admitting: Physical Therapy

## 2012-05-27 ENCOUNTER — Encounter: Payer: Self-pay | Admitting: Rehabilitative and Restorative Service Providers"

## 2012-05-29 ENCOUNTER — Encounter: Payer: Self-pay | Admitting: Rehabilitative and Restorative Service Providers"

## 2012-07-09 ENCOUNTER — Other Ambulatory Visit: Payer: Self-pay | Admitting: Family Medicine

## 2012-07-09 NOTE — Telephone Encounter (Signed)
No paper chart °

## 2012-07-17 ENCOUNTER — Ambulatory Visit (INDEPENDENT_AMBULATORY_CARE_PROVIDER_SITE_OTHER): Payer: Self-pay | Admitting: Emergency Medicine

## 2012-07-17 ENCOUNTER — Encounter: Payer: Self-pay | Admitting: Emergency Medicine

## 2012-07-17 VITALS — BP 124/78 | HR 73 | Ht 62.0 in | Wt 211.9 lb

## 2012-07-17 DIAGNOSIS — N898 Other specified noninflammatory disorders of vagina: Secondary | ICD-10-CM

## 2012-07-17 LAB — POCT WET PREP (WET MOUNT)

## 2012-07-17 MED ORDER — ESTRADIOL 0.1 MG/GM VA CREA: 2.0000 g | TOPICAL_CREAM | Freq: Every day | VAGINAL | Status: DC

## 2012-07-17 NOTE — Assessment & Plan Note (Signed)
Wet prep negative.  GC/Chlamydia negative in 12/12 with no sexual activity since.  Suspect secondary to early/mild atrophy given post menopausal symptoms. Will trial on vaginal estrogen cream.  Follow up in 2 weeks if no improvement.

## 2012-07-17 NOTE — Patient Instructions (Addendum)
It was nice to meet you! You do not have any infections that we can treat. I think what might be causing your symptoms is atrophy of the vaginal wall due to low estrogen. I have sent a prescription for a vaginal estrogen cream.  Use it once a day, every day.  If there is no improvement in 2 weeks come back.

## 2012-07-17 NOTE — Progress Notes (Signed)
  Subjective:    Patient ID: [name], female    DOB: 09/19/1956, 56 y.o.   MRN: [id number]  HPI [name] is here for a SDA for vaginal symptoms.  Reports a 1 week history of vaginal itching and burning.  Does have a small amount of grayish discharge with foul odor. No vaginal bleeding. LMP 2 years ago.  Last sexual activity 1 year ago.  Recent negative GC/Chlamydia.  I have reviewed and updated the following as appropriate: allergies and current medications   Review of Systems See HPI    Objective:   Physical Exam BP 124/78  Pulse 73  Ht 5\' 2"  (1.575 m)  Wt 211 lb 14.4 oz (96.117 kg)  BMI 38.76 kg/m2 Gen: alert, cooperative, NAD Pelvic: normal external genitalia; normal vagina; small amount of white discharge present; no odor     Assessment & Plan:

## 2012-07-23 ENCOUNTER — Ambulatory Visit (INDEPENDENT_AMBULATORY_CARE_PROVIDER_SITE_OTHER): Payer: Self-pay | Admitting: Family Medicine

## 2012-07-23 ENCOUNTER — Encounter: Payer: Self-pay | Admitting: Family Medicine

## 2012-07-23 ENCOUNTER — Ambulatory Visit (HOSPITAL_BASED_OUTPATIENT_CLINIC_OR_DEPARTMENT_OTHER)
Admission: RE | Admit: 2012-07-23 | Discharge: 2012-07-23 | Disposition: A | Discharge status: Home / Self Care | Payer: Self-pay | Source: Ambulatory Visit | Attending: Family Medicine | Admitting: Family Medicine

## 2012-07-23 VITALS — BP 137/89 | HR 69 | Ht 62.0 in | Wt 211.9 lb

## 2012-07-23 DIAGNOSIS — M1610 Unilateral primary osteoarthritis, unspecified hip: Secondary | ICD-10-CM

## 2012-07-23 DIAGNOSIS — M545 Low back pain: Secondary | ICD-10-CM

## 2012-07-23 DIAGNOSIS — M5126 Other intervertebral disc displacement, lumbar region: Secondary | ICD-10-CM | POA: Insufficient documentation

## 2012-07-23 DIAGNOSIS — M549 Dorsalgia, unspecified: Secondary | ICD-10-CM

## 2012-07-23 DIAGNOSIS — M5380 Other specified dorsopathies, site unspecified: Secondary | ICD-10-CM | POA: Insufficient documentation

## 2012-07-25 ENCOUNTER — Telehealth: Payer: Self-pay | Admitting: Emergency Medicine

## 2012-07-25 ENCOUNTER — Encounter: Payer: Self-pay | Admitting: Family Medicine

## 2012-07-25 ENCOUNTER — Other Ambulatory Visit: Payer: Self-pay | Admitting: Family Medicine

## 2012-07-25 DIAGNOSIS — M1610 Unilateral primary osteoarthritis, unspecified hip: Secondary | ICD-10-CM | POA: Insufficient documentation

## 2012-07-25 DIAGNOSIS — M25552 Pain in left hip: Secondary | ICD-10-CM

## 2012-07-25 NOTE — Assessment & Plan Note (Signed)
Setting her up for intraarticular left hip injection

## 2012-07-25 NOTE — Progress Notes (Signed)
Subjective:    Patient ID: [name], female    DOB: 02-16-1956, 56 y.o.   MRN: [id number]  PCP: [facility]  HPI 56 yo F here for f/u left hip and low back pain.  Patient was seen in our office a year ago for left hip, low back pain. She did physical therapy, took ibuprofen, tramadol, tylenol with mild improvement. Seen at [facility] office and had an intraarticular left hip injection that she states helped tremendously and her pain went away. Then over past week the pain started to flare back up and has become severe. Feels primarily in bilateral low back into her hips and down left leg. Difficulty bearing weight on left leg due to pain. Gets numbness into L > R legs. Pain seems better once she starts moving. Feels a constant pain in low back and left groin areas. No bowel/bladder dysfunction. Currently taking ibuprofen 800mg , tylenol arthritis and tramadol.  Past Medical History  Diagnosis Date  . Hypertension     Current Outpatient Prescriptions on File Prior to Visit  Medication Sig Dispense Refill  . amitriptyline (ELAVIL) 25 MG tablet Take 1 tablet (25 mg total) by mouth at bedtime.  30 tablet  1  . aspirin 81 MG chewable tablet Chew 81 mg by mouth daily.        . cyclobenzaprine (FLEXERIL) 10 MG tablet Take 1 tablet (10 mg total) by mouth every 8 (eight) hours as needed for muscle spasms.  60 tablet  1  . estradiol (ESTRACE VAGINAL) 0.1 MG/GM vaginal cream Place 0.25 Applicatorfuls vaginally daily.  42.5 g  12  . hydrochlorothiazide (HYDRODIURIL) 25 MG tablet TAKE ONE TABLET BY MOUTH EVERY DAY  60 tablet  2  . loratadine (CLARITIN) 10 MG tablet TAKE ONE TABLET BY MOUTH EVERY DAY  60 tablet  5  . meloxicam (MOBIC) 15 MG tablet Take 1 tablet (15 mg total) by mouth daily.  30 tablet  2  . metoprolol (LOPRESSOR) 50 MG tablet Take 1 tablet (50 mg total) by mouth 2 (two) times daily.  60 tablet  6    History reviewed. No pertinent past surgical history.  Allergies    Allergen Reactions  . Simvastatin     REACTION: muscle aches, GI complaints    History   Social History  . Marital Status: Divorced    Spouse Name: N/A    Number of Children: N/A  . Years of Education: N/A   Occupational History  . 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 Smoking status: Never Smoker   . Smokeless tobacco: Not on file  . Alcohol Use: No  . Drug Use: No  . Sexually Active: Not on file   Other Topics Concern  . Not on file   Social History Narrative  . No narrative on file    Family History  Problem Relation Age of Onset  . Hypertension Mother   . Diabetes Father   . Hyperlipidemia Brother   . Heart attack Neg Hx   . Sudden death Neg Hx     BP 137/89  Pulse 69  Ht 5\' 2"  (1.575 m)  Wt 211 lb 14.4 oz (96.117 kg)  BMI 38.76 kg/m2  Review of Systems See HPI above.    Objective:   Physical Exam Gen: NAD  Back: No gross deformity, scoliosis. Mild TTP bilateral paraspinal regions.  No midline or  bony TTP. FROM with pain on extension. Strength LEs 5/5 all muscle groups.   1+ MSRs in patellar and achilles tendons, equal bilaterally. Negative SLRs. Sensation intact to light touch bilaterally. Positive logroll left hip, negative on right.    Assessment & Plan:  1. Low back/left hip pain - patient has signs and symptoms of both low back and left hip pathology.  She states symptoms are similar to when she improved with left hip intraarticular injection - advised we go ahead with a repeat intraarticular injection.  Will also assess for lumbar radiculopathy, facet DJD with lumbar spine MRI.  She has completed a course of PT and this didn't help her at that time.  Consider facet vs esi injections based on results if she does not improve significantly with left hip injection.

## 2012-07-25 NOTE — Assessment & Plan Note (Signed)
patient has signs and symptoms of both low back and left hip pathology.  She states symptoms are similar to when she improved with left hip intraarticular injection - advised we go ahead with a repeat intraarticular injection.  Will also assess for lumbar radiculopathy, facet DJD with lumbar spine MRI.  She has completed a course of PT and this didn't help her at that time.  Consider facet vs esi injections based on results if she does not improve significantly with left hip injection.

## 2012-07-25 NOTE — Telephone Encounter (Signed)
Patient is calling because she wants the Rx for Estradiol [facility] so that she can afford it.

## 2012-07-26 NOTE — Telephone Encounter (Signed)
Patient notified

## 2012-07-26 NOTE — Telephone Encounter (Signed)
Advised to have her Rx transferred. She can pick it up from [facility] or have them send it to the [facility]. Pt agreed. Lorenda Hatchet, Renato Battles

## 2012-07-26 NOTE — Telephone Encounter (Signed)
Will forward message to Dr. Elwyn Reach for further suggestions.

## 2012-07-26 NOTE — Telephone Encounter (Signed)
Pt was told by [facility] that they don't have it now because of the funding cuts and wants to know what else she can do - she is asking for our help

## 2012-07-26 NOTE — Telephone Encounter (Signed)
Unfortunately, there are no vaginal estrogens available on discount lists.  She can try a vaginal moisturizer such as Replens or Vagisil Feminine Moisturizer.  She can also try some herbal remedies such as black cohosh or isoflavone supplements.

## 2012-07-26 NOTE — Telephone Encounter (Signed)
Called pt.

## 2012-07-29 ENCOUNTER — Ambulatory Visit
Admission: RE | Admit: 2012-07-29 | Discharge: 2012-07-29 | Disposition: A | Discharge status: Home / Self Care | Payer: No Typology Code available for payment source | Source: Ambulatory Visit | Attending: Family Medicine | Admitting: Family Medicine

## 2012-07-29 DIAGNOSIS — M25552 Pain in left hip: Secondary | ICD-10-CM

## 2012-07-29 MED ORDER — IOHEXOL 180 MG/ML  SOLN
1.0000 mL | Freq: Once | INTRAMUSCULAR | Status: AC | PRN
  Administered 2012-07-29: 1 mL via EPIDURAL

## 2012-07-29 MED ORDER — METHYLPREDNISOLONE ACETATE 40 MG/ML INJ SUSP (RADIOLOG
120.0000 mg | Freq: Once | INTRAMUSCULAR | Status: AC
  Administered 2012-07-29: 120 mg via INTRA_ARTICULAR

## 2012-08-07 ENCOUNTER — Other Ambulatory Visit: Payer: Self-pay | Admitting: Family Medicine

## 2012-08-07 NOTE — Telephone Encounter (Signed)
[name], I am going to forward to you, since you saw this patient a couple of weeks ago, and I am no longer working at the [facility]. Makes sense for me to not be writing meds for her.

## 2012-08-09 ENCOUNTER — Ambulatory Visit (INDEPENDENT_AMBULATORY_CARE_PROVIDER_SITE_OTHER): Payer: Self-pay | Admitting: Emergency Medicine

## 2012-08-09 ENCOUNTER — Encounter: Payer: Self-pay | Admitting: Emergency Medicine

## 2012-08-09 VITALS — BP 122/72 | HR 72 | Ht 62.0 in | Wt 212.7 lb

## 2012-08-09 DIAGNOSIS — Z111 Encounter for screening for respiratory tuberculosis: Secondary | ICD-10-CM

## 2012-08-09 DIAGNOSIS — Z23 Encounter for immunization: Secondary | ICD-10-CM

## 2012-08-09 DIAGNOSIS — I10 Essential (primary) hypertension: Secondary | ICD-10-CM

## 2012-08-09 DIAGNOSIS — M549 Dorsalgia, unspecified: Secondary | ICD-10-CM

## 2012-08-09 NOTE — Addendum Note (Signed)
Addended byArlyss Repress on: 08/09/2012 12:43 PM   Modules accepted: Orders

## 2012-08-09 NOTE — Assessment & Plan Note (Signed)
Appears stable. S/p injection at [facility] 2 weeks ago.  Extensive discussion about disability.  I am happy to provide records or write a letter, but expressed that I do not think she will qualify.  Discussed taking Tylenol Arthritis twice a day scheduled to try and stay ahead of the pain.  She was agreeable with this plan.

## 2012-08-09 NOTE — Patient Instructions (Addendum)
It was good to see you! If you need anything as you explore Disability, please let me know. Start taking Tylenol Arthritis every morning and afternoon, whether you hurt or not.  We want to stay ahead of the pain. Follow up in 3 months.

## 2012-08-09 NOTE — Assessment & Plan Note (Signed)
Well controlled. Continue current medications  

## 2012-08-09 NOTE — Progress Notes (Signed)
  Subjective:    Patient ID: [name], female    DOB: 08-22-1956, 56 y.o.   MRN: [id number]  HPI [name] is here for physical and to discuss disability.  1. I have reviewed and updated the following as appropriate: allergies, current medications, past family history, past medical history, past social history, past surgical history and problem list.  2. 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  Her current patient is on hospice and the family helps out a lot, but she is concerned about being able to do her job without that assistance.  She is willing to find other work that is not so physically taxing, but is concerned about being without an income while she searches for or trains for another job.  For her arthritis, she takes Meloxicam, ibuprofen 800mg  qHS as needed, and occasionally takes tylenol arthritis.  The pain is mostly in her low back and hips.  Does also have some discomfort in her knees as well.  The pain is achy.  Also has significant stiffness, particularly on first standing.  Denies weakness, numbness, tingling.    Review of Systems Patient Information Form: Screening and ROS  Do you feel safe in relationships? yes PHQ-2:negative  Review of Symptoms  General:  Negative for nexplained weight loss, fever Skin: Negative for new or changing mole, sore that won't heal HEENT: Negative for trouble hearing, trouble seeing, ringing in ears, mouth sores, hoarseness, change in voice, dysphagia. CV:  Negative for chest pain, dyspnea, edema, palpitations Resp: Negative for cough, dyspnea, hemoptysis GI: Negative for nausea, vomiting, diarrhea, constipation, abdominal pain, melena, hematochezia. GU: + frequent urination; Negative for dysuria, incontinence, urinary hesitance, hematuria, vaginal or penile discharge, polyuria, sexual difficulty, lumps in testicle or breasts MSK: + joint pain;  Negative for muscle cramps or aches or swelling Neuro: + dizziness; Negative for headaches, weakness, numbness, passing out/fainting Psych: Negative for depression, anxiety, memory problems      Objective:   Physical Exam BP 122/72  Pulse 72  Ht 5\' 2"  (1.575 m)  Wt 212 lb 11.2 oz (96.48 kg)  BMI 38.90 kg/m2 Gen: alert, cooperative, NAD, require additional time to get out of chair HEENT: AT/[place], sclera white, MMM CV: RRR, no murmurs Pulm: CTAB, no wheezes or rales Back: no erythema or bony step-offs, tender over lumbar facet joints (R>L), pain exacerbated with extension, full AROM Neuro: negative SLR, slightly weaker on left lower extremity tested secondary to pain, 1+ symmetric patellar DTR      Assessment & Plan:

## 2012-08-13 ENCOUNTER — Telehealth: Payer: Self-pay | Admitting: Emergency Medicine

## 2012-08-13 ENCOUNTER — Ambulatory Visit (INDEPENDENT_AMBULATORY_CARE_PROVIDER_SITE_OTHER): Payer: Self-pay | Admitting: *Deleted

## 2012-08-13 DIAGNOSIS — Z111 Encounter for screening for respiratory tuberculosis: Secondary | ICD-10-CM

## 2012-08-13 NOTE — Telephone Encounter (Signed)
Patient dropped off form to be filled out for [facility]. Please call when completed.

## 2012-08-13 NOTE — Telephone Encounter (Signed)
Handicapped Placard Application placed in Dr. Jonah Blue box for completion. [name]

## 2012-08-13 NOTE — Progress Notes (Signed)
Patient failed to return  in 72 hours for PPD to be read. PPD repeated today. She  voices understanding to return in 48 to 72 hours to read results.

## 2012-08-14 NOTE — Telephone Encounter (Signed)
Form completed and placed on Donna's desk. 

## 2012-08-14 NOTE — Telephone Encounter (Signed)
Left message for Ms. Hauck that her Handicap Placard form is ready to be picked up at front desk.  Ileana Ladd

## 2012-08-15 ENCOUNTER — Telehealth: Payer: Self-pay | Admitting: *Deleted

## 2012-08-15 DIAGNOSIS — M545 Low back pain: Secondary | ICD-10-CM

## 2012-08-15 NOTE — Telephone Encounter (Signed)
New referral needed for physical therapy

## 2012-08-16 ENCOUNTER — Ambulatory Visit (INDEPENDENT_AMBULATORY_CARE_PROVIDER_SITE_OTHER): Payer: Self-pay | Admitting: *Deleted

## 2012-08-16 DIAGNOSIS — Z111 Encounter for screening for respiratory tuberculosis: Secondary | ICD-10-CM

## 2012-08-16 LAB — TB SKIN TEST
Induration: 0 mm
TB Skin Test: NEGATIVE

## 2012-08-16 NOTE — Progress Notes (Signed)
PPD  Negative , 0 mm.

## 2012-10-11 ENCOUNTER — Ambulatory Visit (INDEPENDENT_AMBULATORY_CARE_PROVIDER_SITE_OTHER): Payer: No Typology Code available for payment source | Admitting: Sports Medicine

## 2012-10-11 VITALS — BP 143/92 | Ht 62.0 in | Wt 212.0 lb

## 2012-10-11 DIAGNOSIS — M25559 Pain in unspecified hip: Secondary | ICD-10-CM

## 2012-10-11 DIAGNOSIS — M169 Osteoarthritis of hip, unspecified: Secondary | ICD-10-CM

## 2012-10-11 MED ORDER — METHYLPREDNISOLONE ACETATE 80 MG/ML IJ SUSP
80.0000 mg | Freq: Once | INTRAMUSCULAR | Status: AC
  Administered 2012-10-11: 80 mg via INTRAMUSCULAR

## 2012-10-11 NOTE — Progress Notes (Signed)
  Subjective:    Patient ID: [name], female    DOB: 1956/01/20, 56 y.o.   MRN: [id number]  HPI chief complaint: Left hip pain  Patient comes in today complaining of long-standing left hip pain. Pain is diffuse in the left hip. She is getting pain both in the groin as well as along the lateral hip. Radiating pain down the left leg to the knee. Pain is much worse with weightbearing. She has had an intra-articular cortisone injection in the left hip which provide her with minimal symptom relief. X-rays of her left hip done in April of this year showed advanced osteoarthritis. She's tried anti-inflammatory medicines. She's also tried a TENS unit which was very beneficial. She is asking about the possibility of aerobics classes.  Current medications as well as her past medical history are reviewed She is allergic to simvastatin    Review of Systems     Objective:   Physical Exam Well-developed, well-nourished. No acute distress. Awake alert and oriented x3  Left hip: There is a marked decrease in internal rotation passively on the left in comparison to the right. This reproduces groin pain. Full external rotation. Slight tenderness over the greater trochanteric bursa but not marked. Reproducible pain with resisted hip flexion. Negative straight leg raise. Neurovascularly intact distally.  X-rays of her left hip from April of this year are reviewed. She is approaching bone-on-bone in the superior acetabulum. Nothing acute is seen. An MRI scan of her lumbar spine done in October of this year showed facet changes       Assessment & Plan:  1. 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 I've injected her with 80 mg of Depo-Medrol IM for her acute pain. Definitive treatment is a total hip arthroplasty but finances preclude her from being able to pursue this at this point in time. She will continue with  both ibuprofen and Ultram as needed for pain. I do think it is a good idea for her to try some water aerobics. I also think it would be beneficial to get her a TENS unit. [facility] will [facility] and see if they can help [place] arrange this. Followup when necessary.

## 2012-10-24 ENCOUNTER — Encounter: Payer: Self-pay | Admitting: *Deleted

## 2012-10-24 DIAGNOSIS — M25552 Pain in left hip: Secondary | ICD-10-CM

## 2012-11-22 ENCOUNTER — Other Ambulatory Visit: Payer: Self-pay | Admitting: Family Medicine

## 2012-12-18 ENCOUNTER — Ambulatory Visit: Payer: Self-pay | Admitting: Family Medicine

## 2012-12-18 ENCOUNTER — Ambulatory Visit (INDEPENDENT_AMBULATORY_CARE_PROVIDER_SITE_OTHER): Payer: Self-pay | Admitting: Family Medicine

## 2012-12-18 ENCOUNTER — Encounter: Payer: Self-pay | Admitting: Family Medicine

## 2012-12-18 VITALS — BP 141/89 | HR 80 | Ht 62.0 in | Wt 211.0 lb

## 2012-12-18 DIAGNOSIS — M1610 Unilateral primary osteoarthritis, unspecified hip: Secondary | ICD-10-CM

## 2012-12-19 ENCOUNTER — Encounter: Payer: Self-pay | Admitting: Family Medicine

## 2012-12-19 NOTE — Progress Notes (Signed)
Subjective:    Patient ID: [name], female    DOB: 1956/02/22, 57 y.o.   MRN: [id number]  PCP: [facility]  Hip Pain    57 yo F here for f/u left hip and low back pain.  07/23/12: Patient was seen in our office a year ago for left hip, low back pain. She did physical therapy, took ibuprofen, tramadol, tylenol with mild improvement. Seen at [facility] office and had an intraarticular left hip injection that she states helped tremendously and her pain went away. Then over past week the pain started to flare back up and has become severe. Feels primarily in bilateral low back into her hips and down left leg. Difficulty bearing weight on left leg due to pain. Gets numbness into L > R legs. Pain seems better once she starts moving. Feels a constant pain in low back and left groin areas. No bowel/bladder dysfunction. Currently taking ibuprofen 800mg , tylenol arthritis and tramadol.  12/18/12: Patient returns with worsening left hip, groin pain radiating into thigh. More difficulty walking past few months. Some relief with IM injection given in [place] office in December. Goes down to toes with occasional numbness but overall this has improved. Worse with cold weather. No bowel/bladder dysfunction. Been doing water aerobics.  Past Medical History  Diagnosis Date  . Hypertension     Current Outpatient Prescriptions on File Prior to Visit  Medication Sig Dispense Refill  . aspirin 81 MG chewable tablet Chew 81 mg by mouth daily.        . hydrochlorothiazide (HYDRODIURIL) 25 MG tablet TAKE ONE TABLET BY MOUTH EVERY DAY  60 tablet  2  . loratadine (CLARITIN) 10 MG tablet TAKE ONE TABLET BY MOUTH EVERY DAY  60 tablet  5  . meloxicam (MOBIC) 15 MG tablet TAKE ONE TABLET BY MOUTH EVERY DAY  30 tablet  1  . metoprolol (LOPRESSOR) 50 MG tablet Take 1 tablet (50 mg total) by mouth 2 (two) times daily.  60 tablet  6  . traMADol (ULTRAM) 50 MG tablet TAKE ONE TABLET BY MOUTH EVERY 12  HOURS AS NEEDED FOR PAIN  30 tablet  3   No current facility-administered medications on file prior to visit.    History reviewed. No pertinent past surgical history.  Allergies  Allergen Reactions  . Simvastatin     REACTION: muscle aches, GI complaints    History   Social History  . Marital Status: Divorced    Spouse Name: N/A    Number of Children: N/A  . Years of Education: N/A   Occupational History  . 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 Smoking status: Never Smoker   . Smokeless tobacco: Not on file  . Alcohol Use: No  . Drug Use: No  . Sexually Active: Not on file   Other Topics Concern  . Not on file   Social History Narrative  . No narrative on file    Family History  Problem Relation Age of Onset  . Hypertension Mother   . Diabetes Father   . Hyperlipidemia Brother   . Heart attack Neg Hx   . Sudden death Neg Hx     BP 141/89  Pulse 80  Ht 5\' 2"  (1.575 m)  Wt 211 lb (95.709 kg)  BMI 38.58 kg/m2  Review of Systems  See HPI above.    Objective:  Physical Exam  Gen: NAD  Back: No gross deformity, scoliosis. Minimal left lumbar paraspinal tenderness.  No midline or bony TTP.  No trochanter TTP. FROM without pain on extension now. Strength LEs 5/5 all muscle groups.   1+ MSRs in patellar and achilles tendons, equal bilaterally. Negative SLRs. Sensation intact to light touch bilaterally. Positive logroll left hip reproducing her pain, negative on right.    Assessment & Plan:  1. Low back/left hip pain - patient has signs and symptoms of both low back and left hip pathology though hip is the predominant issue currently with limited motion, pain reproduced with logroll.  Her [facility] Coverage has lapsed so she needs to get this again before we could go ahead with intraarticular cortisone injection.  Also encouraged to consider coverage through the  Affordable Care Act which may allow her to consider hip replacement surgery.  She will call us when coverage goes through to do hip injection at [facility].

## 2012-12-23 ENCOUNTER — Encounter: Payer: Self-pay | Admitting: Family Medicine

## 2012-12-23 NOTE — Assessment & Plan Note (Signed)
patient has signs and symptoms of both low back and left hip pathology though hip is the predominant issue currently with limited motion, pain reproduced with logroll.  Her [facility] Coverage has lapsed so she needs to get this again before we could go ahead with intraarticular cortisone injection.  Also encouraged to consider coverage through the Affordable Care Act which may allow her to consider hip replacement surgery.  She will call us when coverage goes through to do hip injection at [facility].

## 2012-12-26 ENCOUNTER — Other Ambulatory Visit: Payer: Self-pay | Admitting: Physician Assistant

## 2013-01-10 ENCOUNTER — Other Ambulatory Visit: Payer: Self-pay | Admitting: Emergency Medicine

## 2013-01-10 DIAGNOSIS — Z1231 Encounter for screening mammogram for malignant neoplasm of breast: Secondary | ICD-10-CM

## 2013-01-14 ENCOUNTER — Other Ambulatory Visit: Payer: Self-pay | Admitting: Family Medicine

## 2013-01-14 DIAGNOSIS — M25552 Pain in left hip: Secondary | ICD-10-CM

## 2013-01-16 ENCOUNTER — Ambulatory Visit
Admission: RE | Admit: 2013-01-16 | Discharge: 2013-01-16 | Disposition: A | Discharge status: Home / Self Care | Payer: No Typology Code available for payment source | Source: Ambulatory Visit | Attending: Family Medicine | Admitting: Family Medicine

## 2013-01-16 DIAGNOSIS — M25552 Pain in left hip: Secondary | ICD-10-CM

## 2013-01-16 MED ORDER — IOHEXOL 180 MG/ML  SOLN
1.0000 mL | Freq: Once | INTRAMUSCULAR | Status: AC | PRN
  Administered 2013-01-16: 1 mL via INTRA_ARTICULAR

## 2013-01-16 MED ORDER — METHYLPREDNISOLONE ACETATE 40 MG/ML INJ SUSP (RADIOLOG
120.0000 mg | Freq: Once | INTRAMUSCULAR | Status: AC
  Administered 2013-01-16: 120 mg via INTRA_ARTICULAR

## 2013-01-17 ENCOUNTER — Ambulatory Visit (HOSPITAL_COMMUNITY)
Admission: RE | Admit: 2013-01-17 | Discharge: 2013-01-17 | Disposition: A | Discharge status: Home / Self Care | Payer: No Typology Code available for payment source | Source: Ambulatory Visit | Attending: Emergency Medicine | Admitting: Emergency Medicine

## 2013-01-17 DIAGNOSIS — Z1231 Encounter for screening mammogram for malignant neoplasm of breast: Secondary | ICD-10-CM | POA: Insufficient documentation

## 2013-01-20 ENCOUNTER — Other Ambulatory Visit: Payer: Self-pay | Admitting: Emergency Medicine

## 2013-01-20 DIAGNOSIS — R928 Other abnormal and inconclusive findings on diagnostic imaging of breast: Secondary | ICD-10-CM

## 2013-01-22 ENCOUNTER — Other Ambulatory Visit: Payer: Self-pay | Admitting: Family Medicine

## 2013-01-22 ENCOUNTER — Other Ambulatory Visit: Payer: Self-pay | Admitting: Physician Assistant

## 2013-01-23 ENCOUNTER — Other Ambulatory Visit: Payer: Self-pay | Admitting: *Deleted

## 2013-01-23 MED ORDER — MELOXICAM 15 MG PO TABS: ORAL_TABLET | ORAL | Status: DC

## 2013-01-31 ENCOUNTER — Ambulatory Visit
Admission: RE | Admit: 2013-01-31 | Discharge: 2013-01-31 | Disposition: A | Discharge status: Home / Self Care | Payer: No Typology Code available for payment source | Source: Ambulatory Visit | Attending: Emergency Medicine | Admitting: Emergency Medicine

## 2013-01-31 DIAGNOSIS — R928 Other abnormal and inconclusive findings on diagnostic imaging of breast: Secondary | ICD-10-CM

## 2013-02-04 ENCOUNTER — Telehealth: Payer: Self-pay | Admitting: Emergency Medicine

## 2013-02-04 MED ORDER — HYDROCHLOROTHIAZIDE 25 MG PO TABS: 25.0000 mg | ORAL_TABLET | Freq: Every day | ORAL | Status: DC

## 2013-02-04 NOTE — Telephone Encounter (Signed)
Called and informed patient that I sent in 1 month supply of the HCTZ, and to keep the appointment with Dr Elwyn Reach for further refills.[name], Rodena Medin

## 2013-02-04 NOTE — Telephone Encounter (Signed)
Patient has an appt for 5/6 and needs a refill on her HCTZ and is asking for at least enough to last until that appt to be sent to [facility] on [address].

## 2013-02-18 ENCOUNTER — Encounter: Payer: Self-pay | Admitting: Emergency Medicine

## 2013-02-18 ENCOUNTER — Ambulatory Visit (INDEPENDENT_AMBULATORY_CARE_PROVIDER_SITE_OTHER): Payer: No Typology Code available for payment source | Admitting: Emergency Medicine

## 2013-02-18 VITALS — BP 130/90 | HR 77 | Temp 98.5°F | Ht 62.0 in | Wt 215.0 lb

## 2013-02-18 DIAGNOSIS — M1610 Unilateral primary osteoarthritis, unspecified hip: Secondary | ICD-10-CM

## 2013-02-18 DIAGNOSIS — I10 Essential (primary) hypertension: Secondary | ICD-10-CM

## 2013-02-18 DIAGNOSIS — Z0000 Encounter for general adult medical examination without abnormal findings: Secondary | ICD-10-CM

## 2013-02-18 DIAGNOSIS — E0590 Thyrotoxicosis, unspecified without thyrotoxic crisis or storm: Secondary | ICD-10-CM

## 2013-02-18 DIAGNOSIS — F341 Dysthymic disorder: Secondary | ICD-10-CM

## 2013-02-18 DIAGNOSIS — E785 Hyperlipidemia, unspecified: Secondary | ICD-10-CM

## 2013-02-18 MED ORDER — TRAMADOL HCL 50 MG PO TABS: ORAL_TABLET | ORAL | Status: DC

## 2013-02-18 MED ORDER — AMITRIPTYLINE HCL 50 MG PO TABS: 50.0000 mg | ORAL_TABLET | Freq: Every day | ORAL | Status: DC

## 2013-02-18 NOTE — Patient Instructions (Addendum)
It was nice to see you! Please make a lab appointment in the next week for fasting labs.  Leave your physical form for me at the desk and I will fill it out for your when I get the labs back. I will call you if anything is wrong on the labs, otherwise you will get a letter with the results. Follow up in 3 months.

## 2013-02-18 NOTE — Assessment & Plan Note (Addendum)
Doing well overall.  Will get screening labs.  Up to date on health maintenance.  Has form that she needs filled out.  Will leave this at the front desk when she returns for her lab work.

## 2013-02-18 NOTE — Assessment & Plan Note (Signed)
On amitriptyline.  Will continue.  Denies SI/HI.

## 2013-02-18 NOTE — Assessment & Plan Note (Signed)
Followed by sports medicine.  Patient has applied for disability.  Informed her that I am happy to write a letter listing her diagnoses and treatments.  Recommended that seeing an occupational medicine doctor would likely be her best bet if denied.  Also discussed that she may not qualify for disability.

## 2013-02-18 NOTE — Assessment & Plan Note (Signed)
Will check thyroid studies.

## 2013-02-18 NOTE — Progress Notes (Signed)
  Subjective:    Patient ID: [name], female    DOB: [birth year], 57 y.o.   MRN: [id number]  HPI [name] is here for f/u.  Hypertension Well controlled: yes Compliant with medication: yes Side effects from medication: no Check BP at home: no  Chest pain: no Palpitations: no Vision changes: no Leg edema: no Dizziness: no  Arthritis In hips and back.  Sees Dr. Margaretha Sheffield and Dr. Andree Coss for this.  She is applying for disability.  States that she gets injections which really don't help that much.  Takes meloxicam and ibuprofen (but not on the same day) and tramadol as needed.  I have reviewed and updated the following as appropriate: allergies and current medications PMHx: subclinic hyperthroidism, htn, arthritis, dysthymia  SHx: never smoker  Review of Systems See HPI    Objective:   Physical Exam BP 130/90  Pulse 77  Temp(Src) 98.5 F (36.9 C) (Oral)  Ht 5\' 2"  (1.575 m)  Wt 215 lb (97.523 kg)  BMI 39.31 kg/m2 Gen: alert, cooperative, NAD HEENT: AT/[place], sclera white, MMM Neck: supple CV: RRR, no murmurs Pulm: CTAB, no wheezes or rales Abd: +BS, soft, NTND Ext: no edema, 2+ DP pulses bilaterally     Assessment & Plan:

## 2013-02-18 NOTE — Assessment & Plan Note (Signed)
Well-controlled Continue current medications Follow-up in 3 months 

## 2013-02-18 NOTE — Assessment & Plan Note (Signed)
Will check cmp and lipid panel.

## 2013-02-21 ENCOUNTER — Telehealth: Payer: Self-pay | Admitting: Emergency Medicine

## 2013-02-21 ENCOUNTER — Other Ambulatory Visit (INDEPENDENT_AMBULATORY_CARE_PROVIDER_SITE_OTHER): Payer: No Typology Code available for payment source

## 2013-02-21 ENCOUNTER — Other Ambulatory Visit: Payer: Self-pay | Admitting: Emergency Medicine

## 2013-02-21 DIAGNOSIS — Z0000 Encounter for general adult medical examination without abnormal findings: Secondary | ICD-10-CM

## 2013-02-21 DIAGNOSIS — E0590 Thyrotoxicosis, unspecified without thyrotoxic crisis or storm: Secondary | ICD-10-CM

## 2013-02-21 DIAGNOSIS — I10 Essential (primary) hypertension: Secondary | ICD-10-CM

## 2013-02-21 LAB — LIPID PANEL
HDL: 64 mg/dL (ref >39)
LDL Cholesterol: 131 mg/dL — ABNORMAL HIGH (ref 0–99)
Total CHOL/HDL Ratio: 3.3 Ratio
Triglycerides: 70 mg/dL (ref <150)
VLDL: 14 mg/dL (ref 0–40)

## 2013-02-21 LAB — COMPREHENSIVE METABOLIC PANEL
AST: 13 U/L (ref 0–37)
Albumin: 4.1 g/dL (ref 3.5–5.2)
Alkaline Phosphatase: 117 U/L (ref 39–117)
BUN: 10 mg/dL (ref 6–23)
Potassium: 3.8 mEq/L (ref 3.5–5.3)
Total Bilirubin: 0.4 mg/dL (ref 0.3–1.2)

## 2013-02-21 LAB — CBC
HCT: 37.8 % (ref 36.0–46.0)
MCH: 27.3 pg (ref 26.0–34.0)
MCHC: 32.8 g/dL (ref 30.0–36.0)
RDW: 14.3 % (ref 11.5–15.5)

## 2013-02-21 LAB — POCT GLYCOSYLATED HEMOGLOBIN (HGB A1C): Hemoglobin A1C: 5.4

## 2013-02-21 NOTE — Progress Notes (Signed)
CMP,CBC,TSH,FLP,F T-4,F T- 3 AND A1C DONE TODAY [name]

## 2013-02-21 NOTE — Telephone Encounter (Signed)
Patient dropped off papers to be filled out for [employer].  Please call her when completed.

## 2013-02-21 NOTE — Telephone Encounter (Signed)
The [employer] Report of [employer] placed in Dr. Jonah Blue box for completion.  Ileana Ladd

## 2013-02-24 ENCOUNTER — Encounter: Payer: Self-pay | Admitting: Emergency Medicine

## 2013-02-24 NOTE — Telephone Encounter (Signed)
Formed completed and placed on [employer].

## 2013-02-24 NOTE — Telephone Encounter (Signed)
Left message for [name] that forms are ready to be picked up at the front desk.  [name]

## 2013-03-13 ENCOUNTER — Telehealth: Payer: Self-pay | Admitting: Emergency Medicine

## 2013-03-13 MED ORDER — HYDROCHLOROTHIAZIDE 25 MG PO TABS: 25.0000 mg | ORAL_TABLET | Freq: Every day | ORAL | Status: DC

## 2013-03-13 NOTE — Telephone Encounter (Signed)
HCTZ refilled electronically.  Sent to [facility].

## 2013-03-13 NOTE — Telephone Encounter (Signed)
Will fwd to MD.  Trust Crago L, CMA  

## 2013-03-13 NOTE — Telephone Encounter (Signed)
Patient is calling for a refill on her Hydrochlorothiazide.  When she received her medication earlier in the month, she thought she would have refills on it, but she did not, so she would like this medication refilled with refills sent to [place] at [place].

## 2013-04-16 ENCOUNTER — Telehealth: Payer: Self-pay | Admitting: Emergency Medicine

## 2013-04-16 MED ORDER — HYDROCHLOROTHIAZIDE 25 MG PO TABS: 25.0000 mg | ORAL_TABLET | Freq: Every day | ORAL | Status: DC

## 2013-04-16 NOTE — Telephone Encounter (Signed)
Will fwd to Md.  Morissa Obeirne L, CMA  

## 2013-04-16 NOTE — Telephone Encounter (Signed)
HCTZ refill sent to pharmacy 

## 2013-04-16 NOTE — Telephone Encounter (Signed)
Pt is calling for a refill request on hydrochlorothiazide 25mg . [facility] told her today 7/2 that she has to have approval from her Dr. Before they can refill it. [name]

## 2013-04-28 ENCOUNTER — Ambulatory Visit (INDEPENDENT_AMBULATORY_CARE_PROVIDER_SITE_OTHER): Payer: No Typology Code available for payment source | Admitting: Family Medicine

## 2013-04-28 ENCOUNTER — Encounter: Payer: Self-pay | Admitting: Family Medicine

## 2013-04-28 VITALS — BP 139/74 | HR 83 | Ht 62.0 in | Wt 216.0 lb

## 2013-04-28 DIAGNOSIS — M1610 Unilateral primary osteoarthritis, unspecified hip: Secondary | ICD-10-CM

## 2013-04-28 NOTE — Patient Instructions (Addendum)
We will set you up with bilateral intraarticular hip injections. We'll check again with the orange card to see if any orthopedic surgeons are now associated with this who could do replacement surgery - usually the answer is no though. If you get insurance through the affordable care act call me and we'll set you up to see a surgeon. Follow up with me as needed.

## 2013-04-29 ENCOUNTER — Other Ambulatory Visit: Payer: Self-pay | Admitting: Family Medicine

## 2013-04-29 ENCOUNTER — Encounter: Payer: Self-pay | Admitting: Family Medicine

## 2013-04-29 DIAGNOSIS — M25551 Pain in right hip: Secondary | ICD-10-CM

## 2013-04-29 NOTE — Progress Notes (Signed)
Subjective:    Patient ID: [name], female    DOB: Jul 26, 1956, 57 y.o.   MRN: [id number]  PCP: [facility]  Hip Pain    57 yo F here for f/u left hip and low back pain.  07/23/12: Patient was seen in our office a year ago for left hip, low back pain. She did physical therapy, took ibuprofen, tramadol, tylenol with mild improvement. Seen at [facility] office and had an intraarticular left hip injection that she states helped tremendously and her pain went away. Then over past week the pain started to flare back up and has become severe. Feels primarily in bilateral low back into her hips and down left leg. Difficulty bearing weight on left leg due to pain. Gets numbness into L > R legs. Pain seems better once she starts moving. Feels a constant pain in low back and left groin areas. No bowel/bladder dysfunction. Currently taking ibuprofen 800mg , tylenol arthritis and tramadol.  12/18/12: Patient returns with worsening left hip, groin pain radiating into thigh. More difficulty walking past few months. Some relief with IM injection given in [place] office in December. Goes down to toes with occasional numbness but overall this has improved. Worse with cold weather. No bowel/bladder dysfunction. Been doing water aerobics.  7/14: Patient reports she was able to get orange card/GCCN coverage back. Continues to have bilateral hip pain left worse than right. Pain primarily in left groin. Has known severe DJD of hips. Was turned down by medicaid. Walks with a cane. No numbness/tingling. Still does water aerobics 3-4 times a week.   Past Medical History  Diagnosis Date  . Hypertension     Current Outpatient Prescriptions on File Prior to Visit  Medication Sig Dispense Refill  . amitriptyline (ELAVIL) 50 MG tablet Take 1 tablet (50 mg total) by mouth at bedtime.  30 tablet  3  . aspirin 81 MG chewable tablet Chew 81 mg by mouth daily.        . hydrochlorothiazide  (HYDRODIURIL) 25 MG tablet Take 1 tablet (25 mg total) by mouth daily.  30 tablet  6  . loratadine (CLARITIN) 10 MG tablet TAKE ONE TABLET BY MOUTH EVERY DAY  60 tablet  5  . meloxicam (MOBIC) 15 MG tablet TAKE ONE TABLET BY MOUTH EVERY DAY  30 tablet  1  . metoprolol (LOPRESSOR) 50 MG tablet Take 1 tablet (50 mg total) by mouth 2 (two) times daily.  60 tablet  6  . traMADol (ULTRAM) 50 MG tablet TAKE ONE TABLET BY MOUTH EVERY 12 HOURS AS NEEDED FOR PAIN  30 tablet  3   No current facility-administered medications on file prior to visit.    History reviewed. No pertinent past surgical history.  Allergies  Allergen Reactions  . Simvastatin     REACTION: muscle aches, GI complaints    History   Social History  . Marital Status: Divorced    Spouse Name: N/A    Number of Children: N/A  . Years of Education: N/A   Occupational History  . 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 Smoking status: Never Smoker   . Smokeless tobacco: Not on file  . Alcohol Use: No  . Drug Use: No  . Sexually Active: Not on file   Other Topics Concern  . Not on file   Social History Narrative  . No narrative on file  Family History  Problem Relation Age of Onset  . Hypertension Mother   . Diabetes Father   . Hyperlipidemia Brother   . Heart attack Neg Hx   . Sudden death Neg Hx     BP 139/74  Pulse 83  Ht 5\' 2"  (1.575 m)  Wt 216 lb (97.977 kg)  BMI 39.5 kg/m2  Review of Systems  See HPI above.    Objective:   Physical Exam  Gen: NAD  Back: No gross deformity, scoliosis. Minimal left lumbar paraspinal tenderness.  No midline or bony TTP.  No trochanter TTP. FROM without pain. Strength LEs 5/5 all muscle groups except 4/5 with left hip flexion, painful in groin area. Negative SLRs. Positive logroll left hip reproducing her pain, negative on right.    Assessment & Plan:  1. Low  back/bilateral hip pain - At this point her symptoms primarily coming from her hips with some radiation in thighs.  As discussed previously we will move forward with bilateral intraarticular hip injections.  Will arrange for orthopedic referral through [facility] to discuss possible hip replacement on left, her more painful side should injections not provide lasting relief.  Follow up here as needed.

## 2013-04-29 NOTE — Assessment & Plan Note (Signed)
At this point her symptoms primarily coming from her hips with some radiation in thighs.  As discussed previously we will move forward with bilateral intraarticular hip injections.  Will arrange for orthopedic referral through [facility] to discuss possible hip replacement on left, her more painful side should injections not provide lasting relief.  Follow up here as needed.

## 2013-05-02 ENCOUNTER — Other Ambulatory Visit: Payer: No Typology Code available for payment source

## 2013-05-02 ENCOUNTER — Other Ambulatory Visit: Payer: Self-pay | Admitting: Family Medicine

## 2013-05-02 ENCOUNTER — Ambulatory Visit
Admission: RE | Admit: 2013-05-02 | Discharge: 2013-05-02 | Disposition: A | Discharge status: Home / Self Care | Payer: No Typology Code available for payment source | Source: Ambulatory Visit | Attending: Family Medicine | Admitting: Family Medicine

## 2013-05-02 DIAGNOSIS — M25551 Pain in right hip: Secondary | ICD-10-CM

## 2013-05-02 DIAGNOSIS — M25552 Pain in left hip: Secondary | ICD-10-CM

## 2013-05-02 MED ORDER — METHYLPREDNISOLONE ACETATE 40 MG/ML INJ SUSP (RADIOLOG
120.0000 mg | Freq: Once | INTRAMUSCULAR | Status: AC
  Administered 2013-05-02: 120 mg via INTRA_ARTICULAR

## 2013-05-02 MED ORDER — IOHEXOL 180 MG/ML  SOLN
1.0000 mL | Freq: Once | INTRAMUSCULAR | Status: AC | PRN
  Administered 2013-05-02: 1 mL via INTRA_ARTICULAR

## 2013-05-13 ENCOUNTER — Telehealth: Payer: Self-pay | Admitting: Family Medicine

## 2013-05-13 ENCOUNTER — Other Ambulatory Visit: Payer: Self-pay | Admitting: Emergency Medicine

## 2013-05-13 NOTE — Telephone Encounter (Signed)
The patient is calling because her Rx for Metoprolol has expired so she needs a new one.  She also needs a refill on Loratadine sent to [place] at [facility].

## 2013-05-13 NOTE — Telephone Encounter (Signed)
Will fwd to MD.  Soham Hollett L, CMA  

## 2013-05-13 NOTE — Telephone Encounter (Signed)
See above

## 2013-05-14 MED ORDER — METOPROLOL TARTRATE 50 MG PO TABS: 50.0000 mg | ORAL_TABLET | Freq: Two times a day (BID) | ORAL | Status: DC

## 2013-05-14 MED ORDER — LORATADINE 10 MG PO TABS: 10.0000 mg | ORAL_TABLET | Freq: Every day | ORAL | Status: DC

## 2013-05-14 NOTE — Telephone Encounter (Signed)
Prescriptions for metoprolol and loratadine sent to her pharmacy.

## 2013-05-15 NOTE — Telephone Encounter (Signed)
Pt notified Rx called in per Dr. Pearletha Alfred order.  Coralee Edberg, Darlyne Russian, CMA

## 2013-05-19 ENCOUNTER — Telehealth: Payer: Self-pay | Admitting: *Deleted

## 2013-05-20 ENCOUNTER — Telehealth: Payer: Self-pay | Admitting: *Deleted

## 2013-05-21 ENCOUNTER — Encounter: Payer: Self-pay | Admitting: *Deleted

## 2013-05-21 DIAGNOSIS — M549 Dorsalgia, unspecified: Secondary | ICD-10-CM

## 2013-05-21 DIAGNOSIS — G8929 Other chronic pain: Secondary | ICD-10-CM

## 2013-05-21 NOTE — Telephone Encounter (Signed)
Message in error °

## 2013-05-21 NOTE — Telephone Encounter (Signed)
Patient called about a TENS unit.

## 2013-05-26 ENCOUNTER — Ambulatory Visit (INDEPENDENT_AMBULATORY_CARE_PROVIDER_SITE_OTHER): Payer: No Typology Code available for payment source | Admitting: Emergency Medicine

## 2013-05-26 ENCOUNTER — Encounter: Payer: Self-pay | Admitting: Emergency Medicine

## 2013-05-26 VITALS — BP 141/83 | HR 81 | Ht 62.0 in | Wt 217.3 lb

## 2013-05-26 DIAGNOSIS — I10 Essential (primary) hypertension: Secondary | ICD-10-CM

## 2013-05-26 DIAGNOSIS — M1610 Unilateral primary osteoarthritis, unspecified hip: Secondary | ICD-10-CM

## 2013-05-26 NOTE — Assessment & Plan Note (Signed)
Mildly elevated today. Suspect secondary to pain. Will continue with current medications for now. ASCVD risk 5.0%.  Will hold off on statin for now. Follow up in 3 months.

## 2013-05-26 NOTE — Assessment & Plan Note (Signed)
Left worse than right. Severe on exam. Intra-articular injections did not help. Has appt with [facility] this week to discuss hip replacement. Continue tramadol and meloxicam as needed.

## 2013-05-26 NOTE — Progress Notes (Signed)
  Subjective:    Patient ID: [name], female    DOB: 09-Apr-1956, 57 y.o.   MRN: [id number]  HPI PHYLICIA MCGAUGH is here for f/u hip arthritis and HTN.  Hypertension Well controlled: yes Compliant with medication: yes Side effects from medication: no Check BP at home: no  Chest pain: no Palpitations: no Vision changes: no Leg edema: no Dizziness: no  Arthritis, hips Recently saw Dr. Pearletha Forge who did intra-articular injections of the hips.  She states that they have not helped at all.  Dr. Pearletha Forge has arranged an appt at [facility] to discuss hip replacement.  She is getting around on crutches.  [facility].  No radiating pain, numbness or tingling.  Pain is mostly in left groin, worse with rotational movements.  I have reviewed and updated the following as appropriate: allergies and current medications SHx: never smoker  Review of Systems See HPI    Objective:   Physical Exam BP 141/83  Pulse 81  Ht 5\' 2"  (1.575 m)  Wt 217 lb 4.8 oz (98.567 kg)  BMI 39.73 kg/m2 Gen: alert, cooperative, NAD HEENT: AT/[place], sclera white, MMM Neck: supple CV: RRR, no murmurs Pulm: CTAB, no wheezes or rales Ext: no edema, 2+ DP pulses bilaterally Left hip: pain with internal and external rotation; good strength throughout hip Gait: severe limp     Assessment & Plan:

## 2013-06-02 ENCOUNTER — Ambulatory Visit (INDEPENDENT_AMBULATORY_CARE_PROVIDER_SITE_OTHER): Payer: No Typology Code available for payment source | Admitting: Family Medicine

## 2013-06-02 ENCOUNTER — Encounter: Payer: Self-pay | Admitting: Family Medicine

## 2013-06-02 VITALS — BP 144/83 | HR 73 | Temp 99.2°F | Ht 62.0 in | Wt 219.0 lb

## 2013-06-02 DIAGNOSIS — R21 Rash and other nonspecific skin eruption: Secondary | ICD-10-CM | POA: Insufficient documentation

## 2013-06-02 NOTE — Patient Instructions (Addendum)

## 2013-06-02 NOTE — Progress Notes (Signed)
  Subjective:    Patient ID: [name], female    DOB: August 03, 1956, 56 y.o.   MRN: [id number]  HPI 57 year old female presents for evaluation of rash, patient has had the rash for the past 3 days, she first noticed it the day after her appointment with her orthopedic surgeon, she was stressed after the appointment as she is scheduled for surgery, she states the lesion started on her anterior chest, spread to her extremities, are pruritic, no drainage, denies recent new skin exposure including shampoos/detergents/perfumes. No change her recent diet, itching improved with loratadine and Benadryl   Review of Systems  Constitutional: Negative for fever and chills.  HENT: Negative for facial swelling.   Respiratory: Negative for apnea, choking and chest tightness.   Cardiovascular: Negative for chest pain.  Skin: Positive for rash.       Objective:   Physical Exam Vitals: Reviewed General: Pleasant, African American female in no acute distress  Cardiac: Regular rate and rhythm, S1 and S2 present, no heaves or thrills Respiratory: Clear to auscultation bilaterally, good effort Skin: Erythematous maculopapular rash on chest and left upper extremity, no warmth, no drainage       Assessment & Plan:  Please see problem specific assessment and plan

## 2013-06-02 NOTE — Assessment & Plan Note (Signed)
Maculo-papular rash on chest and upper extremities likely secondary to allergic reaction/stress -Patient to continue over-the-counter Benadryl as needed, may apply Benadryl cream and cold compresses as needed

## 2013-06-09 ENCOUNTER — Other Ambulatory Visit: Payer: Self-pay | Admitting: Orthopedic Surgery

## 2013-06-20 ENCOUNTER — Encounter (HOSPITAL_COMMUNITY): Admission: RE | Payer: Self-pay | Source: Ambulatory Visit

## 2013-06-20 ENCOUNTER — Inpatient Hospital Stay (HOSPITAL_COMMUNITY)
Admission: RE | Admit: 2013-06-20 | Payer: No Typology Code available for payment source | Source: Ambulatory Visit | Admitting: Orthopedic Surgery

## 2013-06-20 SURGERY — ARTHROPLASTY, HIP, TOTAL,POSTERIOR APPROACH
Anesthesia: General | Laterality: Left

## 2013-06-24 ENCOUNTER — Ambulatory Visit: Payer: No Typology Code available for payment source | Attending: Family Medicine | Admitting: Physical Therapy

## 2013-06-24 DIAGNOSIS — M2550 Pain in unspecified joint: Secondary | ICD-10-CM | POA: Insufficient documentation

## 2013-06-24 DIAGNOSIS — IMO0001 Reserved for inherently not codable concepts without codable children: Secondary | ICD-10-CM | POA: Insufficient documentation

## 2013-06-24 DIAGNOSIS — M25569 Pain in unspecified knee: Secondary | ICD-10-CM | POA: Insufficient documentation

## 2013-06-24 DIAGNOSIS — R262 Difficulty in walking, not elsewhere classified: Secondary | ICD-10-CM | POA: Insufficient documentation

## 2013-06-24 DIAGNOSIS — M25559 Pain in unspecified hip: Secondary | ICD-10-CM | POA: Insufficient documentation

## 2013-06-27 ENCOUNTER — Ambulatory Visit: Payer: No Typology Code available for payment source | Admitting: Physical Therapy

## 2013-07-01 ENCOUNTER — Ambulatory Visit: Payer: No Typology Code available for payment source | Admitting: Physical Therapy

## 2013-07-04 ENCOUNTER — Ambulatory Visit: Payer: No Typology Code available for payment source | Admitting: Physical Therapy

## 2013-07-07 ENCOUNTER — Ambulatory Visit: Payer: No Typology Code available for payment source

## 2013-07-09 ENCOUNTER — Ambulatory Visit: Payer: No Typology Code available for payment source | Admitting: Physical Therapy

## 2013-08-07 ENCOUNTER — Ambulatory Visit (INDEPENDENT_AMBULATORY_CARE_PROVIDER_SITE_OTHER): Payer: No Typology Code available for payment source | Admitting: Family Medicine

## 2013-08-07 ENCOUNTER — Other Ambulatory Visit (HOSPITAL_COMMUNITY)
Admission: RE | Admit: 2013-08-07 | Discharge: 2013-08-07 | Disposition: A | Discharge status: Home / Self Care | Payer: No Typology Code available for payment source | Source: Ambulatory Visit | Attending: Family Medicine | Admitting: Family Medicine

## 2013-08-07 ENCOUNTER — Encounter: Payer: Self-pay | Admitting: Family Medicine

## 2013-08-07 VITALS — BP 120/88 | HR 78 | Temp 97.9°F | Ht 62.0 in | Wt 216.0 lb

## 2013-08-07 DIAGNOSIS — Z113 Encounter for screening for infections with a predominantly sexual mode of transmission: Secondary | ICD-10-CM | POA: Insufficient documentation

## 2013-08-07 DIAGNOSIS — A599 Trichomoniasis, unspecified: Secondary | ICD-10-CM

## 2013-08-07 DIAGNOSIS — N898 Other specified noninflammatory disorders of vagina: Secondary | ICD-10-CM

## 2013-08-07 LAB — POCT WET PREP (WET MOUNT)

## 2013-08-07 MED ORDER — METRONIDAZOLE 500 MG PO TABS: 500.0000 mg | ORAL_TABLET | Freq: Two times a day (BID) | ORAL | Status: DC

## 2013-08-07 NOTE — Patient Instructions (Signed)
Nice to meet you. We will call with the results of your tests. If needed we will send in an antibiotic to treat an infection. If you develop nausea, vomiting, abdominal pain, worsening discharge, fevers please seek medical attention.

## 2013-08-10 NOTE — Progress Notes (Signed)
Patient ID: [name], female   DOB: Sep 02, 1956, 57 y.o.   MRN: [id number] [name] is a 57 y.o. female who presents today for vaginal disharge.  Started 4 days ago. Notes thick sticky grayish discharge. Notes itching and burning. Denies odor. Notes no abdominal pain. Is sexually active. Denies fevers. Wants an STD test.    Past Medical History  Diagnosis Date  . Hypertension     History  Smoking status  . Never Smoker   Smokeless tobacco  . Not on file    Family History  Problem Relation Age of Onset  . Hypertension Mother   . Diabetes Father   . Hyperlipidemia Brother   . Heart attack Neg Hx   . Sudden death Neg Hx     Current Outpatient Prescriptions on File Prior to Visit  Medication Sig Dispense Refill  . aspirin 81 MG chewable tablet Chew 81 mg by mouth daily.        . hydrochlorothiazide (HYDRODIURIL) 25 MG tablet Take 1 tablet (25 mg total) by mouth daily.  30 tablet  6  . loratadine (CLARITIN) 10 MG tablet Take 1 tablet (10 mg total) by mouth daily.  30 tablet  6  . meloxicam (MOBIC) 15 MG tablet TAKE ONE TABLET BY MOUTH EVERY DAY  30 tablet  1  . metoprolol (LOPRESSOR) 50 MG tablet Take 1 tablet (50 mg total) by mouth 2 (two) times daily.  60 tablet  6  . traMADol (ULTRAM) 50 MG tablet TAKE ONE TABLET BY MOUTH EVERY 12 HOURS AS NEEDED FOR PAIN  30 tablet  3   No current facility-administered medications on file prior to visit.    ROS: Per HPI   Physical Exam Filed Vitals:   08/07/13 1601  BP: 120/88  Pulse: 78  Temp: 97.9 F (36.6 C)    Physical Examination: General appearance - alert, well appearing, and in no distress GU female - normal external genitalia, normal vaginal mucosa, copious white thick discharge, no bleeding noted, no cervical motion tenderness or adnexal tenderness on bimanual exam   Assessment/Plan: Please see individual problem list.

## 2013-08-10 NOTE — Assessment & Plan Note (Addendum)
Wet prep revealed trich. Will treat with flagyl 500 mg BID for 7 days. Given return precautions.

## 2013-08-21 ENCOUNTER — Other Ambulatory Visit: Payer: Self-pay

## 2013-09-29 ENCOUNTER — Other Ambulatory Visit: Payer: Self-pay | Admitting: Orthopedic Surgery

## 2013-09-29 ENCOUNTER — Telehealth: Payer: Self-pay | Admitting: Emergency Medicine

## 2013-09-29 NOTE — Telephone Encounter (Signed)
Called pt. Left message with female to call us back. Please tell pt, that she needs OV. We don't refill flagyl without being seen. Thanks. Lorenda Hatchet, Renato Battles

## 2013-09-29 NOTE — Telephone Encounter (Signed)
Pt called and would like a refill on her flagyl sent to her pharmacy. jw

## 2013-09-30 ENCOUNTER — Encounter (HOSPITAL_COMMUNITY)
Admission: RE | Admit: 2013-09-30 | Discharge: 2013-09-30 | Disposition: A | Discharge status: Home / Self Care | Payer: Medicaid Other | Source: Ambulatory Visit | Attending: Orthopedic Surgery | Admitting: Orthopedic Surgery

## 2013-09-30 ENCOUNTER — Encounter (HOSPITAL_COMMUNITY): Payer: Self-pay

## 2013-09-30 ENCOUNTER — Ambulatory Visit (HOSPITAL_COMMUNITY)
Admission: RE | Admit: 2013-09-30 | Discharge: 2013-09-30 | Disposition: A | Discharge status: Home / Self Care | Payer: Medicaid Other | Source: Ambulatory Visit | Attending: Orthopedic Surgery | Admitting: Orthopedic Surgery

## 2013-09-30 ENCOUNTER — Encounter (HOSPITAL_COMMUNITY): Payer: Self-pay | Admitting: Pharmacist

## 2013-09-30 HISTORY — DX: Carpal tunnel syndrome, bilateral upper limbs: G56.03

## 2013-09-30 HISTORY — DX: Anxiety disorder, unspecified: F41.9

## 2013-09-30 HISTORY — DX: Unspecified osteoarthritis, unspecified site: M19.90

## 2013-09-30 LAB — CBC WITH DIFFERENTIAL/PLATELET
Basophils Relative: 1 % (ref 0–1)
Eosinophils Absolute: 0.3 10*3/uL (ref 0.0–0.7)
Eosinophils Relative: 4 % (ref 0–5)
HCT: 38.7 % (ref 36.0–46.0)
Hemoglobin: 12.7 g/dL (ref 12.0–15.0)
Lymphs Abs: 4 10*3/uL (ref 0.7–4.0)
MCH: 27.7 pg (ref 26.0–34.0)
MCHC: 32.8 g/dL (ref 30.0–36.0)
MCV: 84.3 fL (ref 78.0–100.0)
Monocytes Absolute: 0.5 10*3/uL (ref 0.1–1.0)
Monocytes Relative: 6 % (ref 3–12)
Neutro Abs: 3.5 10*3/uL (ref 1.7–7.7)
Neutrophils Relative %: 42 % — ABNORMAL LOW (ref 43–77)
WBC: 8.4 10*3/uL (ref 4.0–10.5)

## 2013-09-30 LAB — SURGICAL PCR SCREEN
MRSA, PCR: POSITIVE — AB
Staphylococcus aureus: POSITIVE — AB

## 2013-09-30 LAB — URINALYSIS, ROUTINE W REFLEX MICROSCOPIC
Glucose, UA: NEGATIVE mg/dL
Hgb urine dipstick: NEGATIVE
Ketones, ur: NEGATIVE mg/dL
Leukocytes, UA: NEGATIVE
Nitrite: POSITIVE — AB
Specific Gravity, Urine: 1.019 (ref 1.005–1.030)
pH: 7.5 (ref 5.0–8.0)

## 2013-09-30 LAB — BASIC METABOLIC PANEL
BUN: 8 mg/dL (ref 6–23)
CO2: 24 mEq/L (ref 19–32)
Calcium: 10.8 mg/dL — ABNORMAL HIGH (ref 8.4–10.5)
GFR calc non Af Amer: >90 mL/min (ref >90)
Glucose, Bld: 80 mg/dL (ref 70–99)

## 2013-09-30 LAB — URINE MICROSCOPIC-ADD ON

## 2013-09-30 LAB — PROTIME-INR: INR: 1.28 (ref 0.00–1.49)

## 2013-09-30 LAB — TYPE AND SCREEN: ABO/RH(D): O POS

## 2013-09-30 LAB — ABO/RH: ABO/RH(D): O POS

## 2013-09-30 MED ORDER — DEXTROSE-NACL 5-0.45 % IV SOLN: INTRAVENOUS | Status: DC

## 2013-09-30 MED ORDER — CEFAZOLIN SODIUM-DEXTROSE 2-3 GM-% IV SOLR: 2.0000 g | INTRAVENOUS | Status: DC

## 2013-09-30 MED ORDER — CHLORHEXIDINE GLUCONATE 4 % EX LIQD: 60.0000 mL | Freq: Once | CUTANEOUS | Status: DC

## 2013-09-30 NOTE — Pre-Procedure Instructions (Signed)
[name]  09/30/2013   Your procedure is scheduled on:  Thursday, Dec. 17th   Report to [facility] Short Stay [place] [facility]  2 * 3 at 1:00 PM             [facility] Parking is available)  Call this number if you have problems the morning of surgery: [phone]   Remember:   Do not eat food or drink liquids after midnight tonight.   Take these medicines the morning of surgery with A SIP OF WATER: Loratadine, Metoprolol, Pain Medication   Do not wear jewelry, make-up or nail polish.  Do not wear lotions, powders, or perfumes. You may NOT wear deodorant.  Do not shave underarms & legs 48 hours prior to surgery.    Do not bring valuables to the hospital.  [facility] is not responsible for any belongings or valuables.               Contacts, dentures or bridgework may not be worn into surgery.  Leave suitcase in the car. After surgery it may be brought to your room.  For patients admitted to the hospital, discharge time is determined by your treatment team.    Name and phone number of your driver:    Special Instructions: Shower using CHG 2 nights before surgery and the night before surgery.  If you shower the day of surgery use CHG.  Use special wash - you have one bottle of CHG for all showers.  You should use approximately 1/3 of the bottle for each shower.   Please read over the following fact sheets that you were given: Pain Booklet, Coughing and Deep Breathing, Blood Transfusion Information, MRSA Information and Surgical Site Infection Prevention

## 2013-09-30 NOTE — Progress Notes (Signed)
09/30/13 1311  OBSTRUCTIVE SLEEP APNEA  Have you ever been diagnosed with sleep apnea through a sleep study? No  Do you snore loudly (loud enough to be heard through closed doors)?  0  Do you often feel tired, fatigued, or sleepy during the daytime? 1 (she relates this to her current problem)  Has anyone observed you stop breathing during your sleep? 0  Do you have, or are you being treated for high blood pressure? 1  BMI more than 35 kg/m2? 1  Age over 16 years old? 1  Neck circumference greater than 40 cm/18 inches? 0  Gender: 0  Obstructive Sleep Apnea Score 4  Score 4 or greater  Results sent to PCP

## 2013-09-30 NOTE — Progress Notes (Signed)
Called Dr. Turner Daniels with UA results. (he was going to use antibiotic). I did call the patient regarding a time change--- she will be here at 10:30 AM tomorrow.   DA

## 2013-09-30 NOTE — H&P (Signed)
TOTAL HIP ADMISSION H&P  Patient is admitted for left total hip arthroplasty.  Subjective:  Chief Complaint: left hip pain  HPI: [name], 57 y.o. female, has a history of pain and functional disability in the left hip(s) due to arthritis and patient has failed non-surgical conservative treatments for greater than 12 weeks to include NSAID's and/or analgesics and activity modification.  Onset of symptoms was gradual starting 2 years ago with gradually worsening course since that time.The patient noted no past surgery on the left hip(s).  Patient currently rates pain in the left hip at 10 out of 10 with activity. Patient has night pain, worsening of pain with activity and weight bearing, pain that interfers with activities of daily living and pain with passive range of motion. Patient has evidence of joint space narrowing by imaging studies. This condition presents safety issues increasing the risk of falls.   There is no current active infection.  Patient Active Problem List   Diagnosis Date Noted  . Trichomoniasis 08/07/2013  . Rash and nonspecific skin eruption 06/02/2013  . Arthritis, hip 07/25/2012  . Back pain 04/16/2012  . Dysthymia 12/22/2011  . Annual physical exam 09/20/2011  . UNSPECIFIED EPISODIC MOOD DISORDER 01/04/2010  . THYROMEGALY 12/30/2009  . ALLERGIC RHINITIS, SEASONAL 12/15/2009  . HYPERLIPIDEMIA 11/09/2008  . HYPERTHYROIDISM, SUBCLINICAL 05/08/2007  . HYPERTENSION, BENIGN ESSENTIAL 01/15/2007  . OBESITY, NOS 12/13/2006   Past Medical History  Diagnosis Date  . Hypertension   . Anxiety   . 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 Carpal tunnel syndrome, bilateral     No past surgical history on file.  No prescriptions prior to admission   Allergies  Allergen Reactions  . Simvastatin     REACTION: muscle aches, GI complaints    History  Substance Use Topics  . Smoking status: Never Smoker   . Smokeless tobacco: Not on file  . Alcohol Use:  No    Family History  Problem Relation Age of Onset  . Hypertension Mother   . Diabetes Father   . Hyperlipidemia Brother   . Heart attack Neg Hx   . Sudden death Neg Hx      Review of Systems  Constitutional: Negative.   HENT: Negative.   Eyes: Negative.   Respiratory: Negative.   Cardiovascular: Negative.   Gastrointestinal: Negative.   Genitourinary: Negative.   Musculoskeletal: Positive for joint pain.  Skin: Negative.   Neurological: Negative.   Endo/Heme/Allergies: Negative.   Psychiatric/Behavioral: Positive for depression.    Objective:  Physical Exam  Constitutional: She is oriented to person, place, and time. She appears well-developed and well-nourished.  HENT:  Head: Normocephalic and atraumatic.  Eyes: Pupils are equal, round, and reactive to light.  Neck: Normal range of motion. Neck supple.  Cardiovascular: Intact distal pulses.   Respiratory: Effort normal.  Musculoskeletal:  Any attempts at internal or external rotation of her left hip causes severe pain foot tap is negative.  Her skin is intact normal sensation to her leg and foot intact motors to testing of the dorsiflexors and plantar flexors of the foot.  Neurological: She is alert and oriented to person, place, and time.  Skin: Skin is warm and dry.  Psychiatric: She has a normal mood and affect. Her behavior is normal. Judgment and thought content normal.    Vital signs in last 24 hours: Temp:  [98.5 F (36.9 C)] 98.5 F (36.9 C) (12/16 1304) Pulse Rate:  [79] 79 (  12/16 1304) Resp:  [20] 20 (12/16 1304) BP: (134)/(81) 134/81 mmHg (12/16 1304) SpO2:  [93 %] 93 % (12/16 1304) Weight:  [98.975 kg (218 lb 3.2 oz)] 98.975 kg (218 lb 3.2 oz) (12/16 1304)  Labs:   Estimated body mass index is 39.50 kg/(m^2) as calculated from the following:   Height as of 08/07/13: 5\' 2"  (1.575 m).   Weight as of 08/07/13: 97.977 kg (216 lb).   Imaging Review Radiographs:  X-rays were ordered, performed,  and interpreted  included; AP and lateral x-rays of the left hip show end-stage arthritis bone-on-bone early lateral subluxation of the femoral head subchondral cyst.  Assessment/Plan:  End stage arthritis, left hip(s)  The patient history, physical examination, clinical judgement of the provider and imaging studies are consistent with end stage degenerative joint disease of the left hip(s) and total hip arthroplasty is deemed medically necessary. The treatment options including medical management, injection therapy, arthroscopy and arthroplasty were discussed at length. The risks and benefits of total hip arthroplasty were presented and reviewed. The risks due to aseptic loosening, infection, stiffness, dislocation/subluxation,  thromboembolic complications and other imponderables were discussed.  The patient acknowledged the explanation, agreed to proceed with the plan and consent was signed. Patient is being admitted for inpatient treatment for surgery, pain control, PT, OT, prophylactic antibiotics, VTE prophylaxis, progressive ambulation and ADL's and discharge planning.The patient is planning to be discharged home with home health services

## 2013-10-01 ENCOUNTER — Encounter (HOSPITAL_COMMUNITY): Payer: Medicaid Other | Admitting: Certified Registered Nurse Anesthetist

## 2013-10-01 ENCOUNTER — Encounter (HOSPITAL_COMMUNITY)
Admission: RE | Disposition: A | Discharge status: Home Health | Payer: Self-pay | Source: Ambulatory Visit | Attending: Orthopedic Surgery

## 2013-10-01 ENCOUNTER — Inpatient Hospital Stay (HOSPITAL_COMMUNITY): Payer: Medicaid Other | Admitting: Certified Registered Nurse Anesthetist

## 2013-10-01 ENCOUNTER — Encounter (HOSPITAL_COMMUNITY): Payer: Self-pay | Admitting: Surgery

## 2013-10-01 ENCOUNTER — Inpatient Hospital Stay (HOSPITAL_COMMUNITY): Payer: Medicaid Other

## 2013-10-01 ENCOUNTER — Inpatient Hospital Stay (HOSPITAL_COMMUNITY)
Admission: RE | Admit: 2013-10-01 | Discharge: 2013-10-03 | DRG: 470 | Disposition: A | Discharge status: Home Health | Payer: Medicaid Other | Source: Ambulatory Visit | Attending: Orthopedic Surgery | Admitting: Orthopedic Surgery

## 2013-10-01 DIAGNOSIS — Z8249 Family history of ischemic heart disease and other diseases of the circulatory system: Secondary | ICD-10-CM

## 2013-10-01 DIAGNOSIS — Z01812 Encounter for preprocedural laboratory examination: Secondary | ICD-10-CM

## 2013-10-01 DIAGNOSIS — N390 Urinary tract infection, site not specified: Secondary | ICD-10-CM | POA: Diagnosis present

## 2013-10-01 DIAGNOSIS — M1610 Unilateral primary osteoarthritis, unspecified hip: Principal | ICD-10-CM | POA: Diagnosis present

## 2013-10-01 DIAGNOSIS — M169 Osteoarthritis of hip, unspecified: Principal | ICD-10-CM | POA: Diagnosis present

## 2013-10-01 DIAGNOSIS — M1611 Unilateral primary osteoarthritis, right hip: Secondary | ICD-10-CM | POA: Diagnosis present

## 2013-10-01 DIAGNOSIS — Z01810 Encounter for preprocedural cardiovascular examination: Secondary | ICD-10-CM

## 2013-10-01 DIAGNOSIS — E785 Hyperlipidemia, unspecified: Secondary | ICD-10-CM | POA: Diagnosis present

## 2013-10-01 DIAGNOSIS — I10 Essential (primary) hypertension: Secondary | ICD-10-CM | POA: Diagnosis present

## 2013-10-01 DIAGNOSIS — Z889 Allergy status to unspecified drugs, medicaments and biological substances status: Secondary | ICD-10-CM

## 2013-10-01 DIAGNOSIS — J309 Allergic rhinitis, unspecified: Secondary | ICD-10-CM | POA: Diagnosis present

## 2013-10-01 DIAGNOSIS — K3189 Other diseases of stomach and duodenum: Secondary | ICD-10-CM | POA: Diagnosis not present

## 2013-10-01 DIAGNOSIS — Z833 Family history of diabetes mellitus: Secondary | ICD-10-CM

## 2013-10-01 DIAGNOSIS — Z01818 Encounter for other preprocedural examination: Secondary | ICD-10-CM

## 2013-10-01 DIAGNOSIS — Z6839 Body mass index (BMI) 39.0-39.9, adult: Secondary | ICD-10-CM

## 2013-10-01 HISTORY — PX: TOTAL HIP ARTHROPLASTY: SHX124

## 2013-10-01 SURGERY — ARTHROPLASTY, HIP, TOTAL,POSTERIOR APPROACH
Anesthesia: General | Site: Hip | Laterality: Left

## 2013-10-01 MED ORDER — ONDANSETRON HCL 4 MG/2ML IJ SOLN
INTRAMUSCULAR | Status: DC | PRN
  Administered 2013-10-01: 4 mg via INTRAVENOUS

## 2013-10-01 MED ORDER — DOCUSATE SODIUM 100 MG PO CAPS
100.0000 mg | ORAL_CAPSULE | Freq: Two times a day (BID) | ORAL | Status: DC
  Administered 2013-10-01 – 2013-10-03 (×4): 100 mg via ORAL
  Filled 2013-10-01 (×5): qty 1

## 2013-10-01 MED ORDER — MENTHOL 3 MG MT LOZG
1.0000 | LOZENGE | OROMUCOSAL | Status: DC | PRN
  Filled 2013-10-01: qty 9

## 2013-10-01 MED ORDER — ONDANSETRON HCL 4 MG/2ML IJ SOLN: 4.0000 mg | Freq: Four times a day (QID) | INTRAMUSCULAR | Status: DC | PRN

## 2013-10-01 MED ORDER — METHOCARBAMOL 100 MG/ML IJ SOLN
500.0000 mg | Freq: Four times a day (QID) | INTRAVENOUS | Status: DC | PRN
  Filled 2013-10-01: qty 5

## 2013-10-01 MED ORDER — KCL IN DEXTROSE-NACL 20-5-0.45 MEQ/L-%-% IV SOLN
INTRAVENOUS | Status: DC
  Administered 2013-10-01 – 2013-10-02 (×2): via INTRAVENOUS
  Filled 2013-10-01 (×7): qty 1000

## 2013-10-01 MED ORDER — METOPROLOL TARTRATE 50 MG PO TABS
50.0000 mg | ORAL_TABLET | Freq: Every day | ORAL | Status: DC
  Administered 2013-10-02: 50 mg via ORAL
  Filled 2013-10-01 (×2): qty 1

## 2013-10-01 MED ORDER — HYDROMORPHONE HCL PF 1 MG/ML IJ SOLN
INTRAMUSCULAR | Status: AC
  Filled 2013-10-01: qty 1

## 2013-10-01 MED ORDER — LACTATED RINGERS IV SOLN
INTRAVENOUS | Status: DC | PRN
  Administered 2013-10-01 (×2): via INTRAVENOUS

## 2013-10-01 MED ORDER — PHENYLEPHRINE HCL 10 MG/ML IJ SOLN
INTRAMUSCULAR | Status: DC | PRN
  Administered 2013-10-01: 120 ug via INTRAVENOUS

## 2013-10-01 MED ORDER — METOCLOPRAMIDE HCL 5 MG/ML IJ SOLN
5.0000 mg | Freq: Three times a day (TID) | INTRAMUSCULAR | Status: DC | PRN
  Administered 2013-10-01: 10 mg via INTRAVENOUS
  Filled 2013-10-01: qty 2

## 2013-10-01 MED ORDER — OXYCODONE HCL 5 MG/5ML PO SOLN: 5.0000 mg | Freq: Once | ORAL | Status: DC | PRN

## 2013-10-01 MED ORDER — MUPIROCIN 2 % EX OINT
TOPICAL_OINTMENT | CUTANEOUS | Status: AC
  Administered 2013-10-01: 1 via NASAL
  Filled 2013-10-01: qty 22

## 2013-10-01 MED ORDER — LIDOCAINE HCL (CARDIAC) 20 MG/ML IV SOLN
INTRAVENOUS | Status: DC | PRN
  Administered 2013-10-01: 50 mg via INTRAVENOUS

## 2013-10-01 MED ORDER — CEFAZOLIN SODIUM-DEXTROSE 2-3 GM-% IV SOLR
INTRAVENOUS | Status: AC
  Administered 2013-10-01: 2 g via INTRAVENOUS
  Filled 2013-10-01: qty 50

## 2013-10-01 MED ORDER — SODIUM CHLORIDE 0.9 % IR SOLN
Status: DC | PRN
  Administered 2013-10-01: 1000 mL

## 2013-10-01 MED ORDER — LACTATED RINGERS IV SOLN
INTRAVENOUS | Status: DC
  Administered 2013-10-01: 11:00:00 via INTRAVENOUS

## 2013-10-01 MED ORDER — HYDROCHLOROTHIAZIDE 25 MG PO TABS
25.0000 mg | ORAL_TABLET | Freq: Every day | ORAL | Status: DC
  Administered 2013-10-01 – 2013-10-03 (×3): 25 mg via ORAL
  Filled 2013-10-01 (×3): qty 1

## 2013-10-01 MED ORDER — ONDANSETRON HCL 4 MG PO TABS: 4.0000 mg | ORAL_TABLET | Freq: Four times a day (QID) | ORAL | Status: DC | PRN

## 2013-10-01 MED ORDER — OXYCODONE HCL 5 MG PO TABS: 5.0000 mg | ORAL_TABLET | Freq: Once | ORAL | Status: DC | PRN

## 2013-10-01 MED ORDER — BUPIVACAINE-EPINEPHRINE 0.5% -1:200000 IJ SOLN
INTRAMUSCULAR | Status: DC | PRN
  Administered 2013-10-01: 20 mL

## 2013-10-01 MED ORDER — LORATADINE 10 MG PO TABS
10.0000 mg | ORAL_TABLET | Freq: Every day | ORAL | Status: DC
  Administered 2013-10-02 – 2013-10-03 (×2): 10 mg via ORAL
  Filled 2013-10-01 (×2): qty 1

## 2013-10-01 MED ORDER — WHITE PETROLATUM GEL
Status: AC
  Administered 2013-10-01: 1
  Filled 2013-10-01: qty 5

## 2013-10-01 MED ORDER — GLYCOPYRROLATE 0.2 MG/ML IJ SOLN
INTRAMUSCULAR | Status: DC | PRN
  Administered 2013-10-01: .8 mg via INTRAVENOUS

## 2013-10-01 MED ORDER — DIPHENHYDRAMINE HCL 12.5 MG/5ML PO ELIX
12.5000 mg | ORAL_SOLUTION | ORAL | Status: DC | PRN
  Administered 2013-10-01 – 2013-10-02 (×2): 12.5 mg via ORAL
  Administered 2013-10-02: 25 mg via ORAL
  Administered 2013-10-02: 12.5 mg via ORAL
  Filled 2013-10-01 (×4): qty 10

## 2013-10-01 MED ORDER — HYDROMORPHONE HCL PF 1 MG/ML IJ SOLN
0.2500 mg | INTRAMUSCULAR | Status: DC | PRN
  Administered 2013-10-01 (×4): 0.5 mg via INTRAVENOUS

## 2013-10-01 MED ORDER — METOCLOPRAMIDE HCL 10 MG PO TABS: 5.0000 mg | ORAL_TABLET | Freq: Three times a day (TID) | ORAL | Status: DC | PRN

## 2013-10-01 MED ORDER — PHENOL 1.4 % MT LIQD: 1.0000 | OROMUCOSAL | Status: DC | PRN

## 2013-10-01 MED ORDER — NEOSTIGMINE METHYLSULFATE 1 MG/ML IJ SOLN
INTRAMUSCULAR | Status: DC | PRN
  Administered 2013-10-01: 4 mg via INTRAVENOUS

## 2013-10-01 MED ORDER — FENTANYL CITRATE 0.05 MG/ML IJ SOLN
INTRAMUSCULAR | Status: DC | PRN
  Administered 2013-10-01: 50 ug via INTRAVENOUS
  Administered 2013-10-01: 100 ug via INTRAVENOUS
  Administered 2013-10-01 (×2): 50 ug via INTRAVENOUS

## 2013-10-01 MED ORDER — ROCURONIUM BROMIDE 100 MG/10ML IV SOLN
INTRAVENOUS | Status: DC | PRN
  Administered 2013-10-01: 50 mg via INTRAVENOUS

## 2013-10-01 MED ORDER — BUPIVACAINE-EPINEPHRINE (PF) 0.5% -1:200000 IJ SOLN
INTRAMUSCULAR | Status: AC
  Filled 2013-10-01: qty 10

## 2013-10-01 MED ORDER — HYDROMORPHONE HCL PF 1 MG/ML IJ SOLN: 1.0000 mg | INTRAMUSCULAR | Status: DC | PRN

## 2013-10-01 MED ORDER — ACETAMINOPHEN 650 MG RE SUPP: 650.0000 mg | Freq: Four times a day (QID) | RECTAL | Status: DC | PRN

## 2013-10-01 MED ORDER — OXYCODONE HCL 5 MG PO TABS
5.0000 mg | ORAL_TABLET | ORAL | Status: DC | PRN
  Administered 2013-10-01 – 2013-10-02 (×4): 10 mg via ORAL
  Filled 2013-10-01 (×4): qty 2

## 2013-10-01 MED ORDER — ONDANSETRON HCL 4 MG/2ML IJ SOLN
4.0000 mg | Freq: Four times a day (QID) | INTRAMUSCULAR | Status: DC | PRN
  Administered 2013-10-01: 4 mg via INTRAVENOUS
  Filled 2013-10-01: qty 2

## 2013-10-01 MED ORDER — MUPIROCIN 2 % EX OINT
TOPICAL_OINTMENT | Freq: Two times a day (BID) | CUTANEOUS | Status: DC
  Administered 2013-10-01: 22:00:00 via NASAL
  Administered 2013-10-01: 1 via NASAL
  Administered 2013-10-02 – 2013-10-03 (×3): via NASAL
  Filled 2013-10-01: qty 22

## 2013-10-01 MED ORDER — ASPIRIN EC 325 MG PO TBEC
325.0000 mg | DELAYED_RELEASE_TABLET | Freq: Every day | ORAL | Status: DC
  Administered 2013-10-02 – 2013-10-03 (×2): 325 mg via ORAL
  Filled 2013-10-01 (×3): qty 1

## 2013-10-01 MED ORDER — MIDAZOLAM HCL 5 MG/5ML IJ SOLN
INTRAMUSCULAR | Status: DC | PRN
  Administered 2013-10-01: 2 mg via INTRAVENOUS

## 2013-10-01 MED ORDER — METHOCARBAMOL 500 MG PO TABS
500.0000 mg | ORAL_TABLET | Freq: Four times a day (QID) | ORAL | Status: DC | PRN
  Administered 2013-10-01 – 2013-10-02 (×2): 500 mg via ORAL
  Filled 2013-10-01 (×2): qty 1

## 2013-10-01 MED ORDER — ACETAMINOPHEN 325 MG PO TABS: 650.0000 mg | ORAL_TABLET | Freq: Four times a day (QID) | ORAL | Status: DC | PRN

## 2013-10-01 MED ORDER — PROPOFOL 10 MG/ML IV BOLUS
INTRAVENOUS | Status: DC | PRN
  Administered 2013-10-01: 170 mg via INTRAVENOUS

## 2013-10-01 SURGICAL SUPPLY — 51 items
BLADE SAW SGTL 18X1.27X75 (BLADE) ×2 IMPLANT
BRUSH FEMORAL CANAL (MISCELLANEOUS) IMPLANT
CAPT HIP PF COP ×1 IMPLANT
CLOTH BEACON ORANGE TIMEOUT ST (SAFETY) ×2 IMPLANT
COVER BACK TABLE 24X17X13 BIG (DRAPES) IMPLANT
COVER SURGICAL LIGHT HANDLE (MISCELLANEOUS) ×4 IMPLANT
DRAPE ORTHO SPLIT 77X108 STRL (DRAPES) ×2
DRAPE PROXIMA HALF (DRAPES) ×2 IMPLANT
DRAPE SURG ORHT 6 SPLT 77X108 (DRAPES) ×1 IMPLANT
DRAPE U-SHAPE 47X51 STRL (DRAPES) ×2 IMPLANT
DRILL BIT 7/64X5 (BIT) ×2 IMPLANT
DRSG AQUACEL AG ADV 3.5X10 (GAUZE/BANDAGES/DRESSINGS) ×2 IMPLANT
DURAPREP 26ML APPLICATOR (WOUND CARE) ×2 IMPLANT
ELECT BLADE 4.0 EZ CLEAN MEGAD (MISCELLANEOUS) ×2
ELECT REM PT RETURN 9FT ADLT (ELECTROSURGICAL) ×2
ELECTRODE BLDE 4.0 EZ CLN MEGD (MISCELLANEOUS) IMPLANT
ELECTRODE REM PT RTRN 9FT ADLT (ELECTROSURGICAL) ×1 IMPLANT
GAUZE XEROFORM 1X8 LF (GAUZE/BANDAGES/DRESSINGS) ×2 IMPLANT
GLOVE BIO SURGEON STRL SZ7.5 (GLOVE) ×2 IMPLANT
GLOVE BIO SURGEON STRL SZ8.5 (GLOVE) ×4 IMPLANT
GLOVE BIOGEL PI IND STRL 8 (GLOVE) ×2 IMPLANT
GLOVE BIOGEL PI IND STRL 9 (GLOVE) ×1 IMPLANT
GLOVE BIOGEL PI INDICATOR 8 (GLOVE) ×2
GLOVE BIOGEL PI INDICATOR 9 (GLOVE) ×1
GOWN PREVENTION PLUS XLARGE (GOWN DISPOSABLE) ×2 IMPLANT
GOWN STRL NON-REIN LRG LVL3 (GOWN DISPOSABLE) ×4 IMPLANT
GOWN STRL REIN XL XLG (GOWN DISPOSABLE) ×4 IMPLANT
HANDPIECE INTERPULSE COAX TIP (DISPOSABLE)
HOOD PEEL AWAY FACE SHEILD DIS (HOOD) ×4 IMPLANT
KIT BASIN OR (CUSTOM PROCEDURE TRAY) ×2 IMPLANT
KIT ROOM TURNOVER OR (KITS) ×2 IMPLANT
MANIFOLD NEPTUNE II (INSTRUMENTS) ×2 IMPLANT
NEEDLE 22X1 1/2 (OR ONLY) (NEEDLE) ×2 IMPLANT
NS IRRIG 1000ML POUR BTL (IV SOLUTION) ×2 IMPLANT
PACK TOTAL JOINT (CUSTOM PROCEDURE TRAY) ×2 IMPLANT
PAD ARMBOARD 7.5X6 YLW CONV (MISCELLANEOUS) ×4 IMPLANT
PASSER SUT SWANSON 36MM LOOP (INSTRUMENTS) ×2 IMPLANT
PRESSURIZER FEMORAL UNIV (MISCELLANEOUS) IMPLANT
SET HNDPC FAN SPRY TIP SCT (DISPOSABLE) IMPLANT
SUT ETHIBOND 2 V 37 (SUTURE) ×2 IMPLANT
SUT ETHILON 3 0 FSL (SUTURE) ×2 IMPLANT
SUT VIC AB 0 CTB1 27 (SUTURE) ×2 IMPLANT
SUT VIC AB 1 CTX 36 (SUTURE) ×2
SUT VIC AB 1 CTX36XBRD ANBCTR (SUTURE) ×1 IMPLANT
SUT VIC AB 2-0 CTB1 (SUTURE) ×2 IMPLANT
SYR CONTROL 10ML LL (SYRINGE) ×2 IMPLANT
TOWEL OR 17X24 6PK STRL BLUE (TOWEL DISPOSABLE) ×2 IMPLANT
TOWEL OR 17X26 10 PK STRL BLUE (TOWEL DISPOSABLE) ×2 IMPLANT
TOWER CARTRIDGE SMART MIX (DISPOSABLE) IMPLANT
TRAY FOLEY CATH 14FR (SET/KITS/TRAYS/PACK) IMPLANT
WATER STERILE IRR 1000ML POUR (IV SOLUTION) ×8 IMPLANT

## 2013-10-01 NOTE — Anesthesia Procedure Notes (Signed)
Procedure Name: Intubation Date/Time: 10/01/2013 12:51 PM Performed by: Brien Mates D Pre-anesthesia Checklist: Patient identified, Emergency Drugs available, Suction available, Timeout performed and Patient being monitored Patient Re-evaluated:Patient Re-evaluated prior to inductionOxygen Delivery Method: Circle system utilized Preoxygenation: Pre-oxygenation with 100% oxygen Intubation Type: IV induction Ventilation: Mask ventilation without difficulty and Nasal airway inserted- appropriate to patient size Laryngoscope Size: Miller and 2 Grade View: Grade I Tube type: Oral Tube size: 7.5 mm Number of attempts: 1 Airway Equipment and Method: Stylet Placement Confirmation: ETT inserted through vocal cords under direct vision,  positive ETCO2 and breath sounds checked- equal and bilateral Secured at: 21 cm Tube secured with: Tape Dental Injury: Teeth and Oropharynx as per pre-operative assessment

## 2013-10-01 NOTE — Anesthesia Preprocedure Evaluation (Signed)
Anesthesia Evaluation  Patient identified by MRN, date of birth, ID band Patient awake    Reviewed: Allergy & Precautions, H&P , NPO status , Patient's Chart, lab work & pertinent test results  Airway Mallampati: II  Neck ROM: full    Dental   Pulmonary          Cardiovascular hypertension,     Neuro/Psych Anxiety Depression  Neuromuscular disease    GI/Hepatic   Endo/Other  Hyperthyroidism Morbid obesity  Renal/GU      Musculoskeletal  (+) Arthritis -,   Abdominal   Peds  Hematology   Anesthesia Other Findings   Reproductive/Obstetrics                           Anesthesia Physical Anesthesia Plan  ASA: II  Anesthesia Plan: General   Post-op Pain Management:    Induction: Intravenous  Airway Management Planned: Oral ETT  Additional Equipment:   Intra-op Plan:   Post-operative Plan: Extubation in OR  Informed Consent: I have reviewed the patients History and Physical, chart, labs and discussed the procedure including the risks, benefits and alternatives for the proposed anesthesia with the patient or authorized representative who has indicated his/her understanding and acceptance.     Plan Discussed with: CRNA, Anesthesiologist and Surgeon  Anesthesia Plan Comments:         Anesthesia Quick Evaluation

## 2013-10-01 NOTE — Op Note (Signed)
OPERATIVE REPORT    DATE OF PROCEDURE:  10/01/2013       PREOPERATIVE DIAGNOSIS:  LEFT HIP OSTEOARTHRITIS                                                          POSTOPERATIVE DIAGNOSIS:  LEFT HIP OSTEOARTHRITIS                                                           PROCEDURE:  L total hip arthroplasty using a 52 mm DePuy Pinnacle  Cup, [employer], 10-degree polyethylene liner index superior  and posterior, a +0 36 mm ceramic head, a 20x15x42x160 SROM stem, 20DS Sleeve   SURGEON: Tyffani Foglesong J    ASSISTANT:   Eric K. [employer]  (present throughout entire procedure and necessary for timely completion of the procedure)   ANESTHESIA: General BLOOD LOSS: 300 FLUID REPLACEMENT: 1500 crystalloid DRAINS: Foley Catheter URINE OUTPUT: 300cc COMPLICATIONS: none    INDICATIONS FOR PROCEDURE: A 57 y.o. year-old With  LEFT HIP OSTEOARTHRITIS   for 4 years, x-rays show bone-on-bone arthritic changes. Despite conservative measures with observation, anti-inflammatory medicine, narcotics, use of a cane, has severe unremitting pain and can ambulate only a few blocks before resting.  Patient desires elective L total hip arthroplasty to decrease pain and increase function. The risks, benefits, and alternatives were discussed at length including but not limited to the risks of infection, bleeding, nerve injury, stiffness, blood clots, the need for revision surgery, cardiopulmonary complications, among others, and they were willing to proceed. Questions answered     PROCEDURE IN DETAIL: The patient was identified by armband,  received preoperative IV antibiotics in the holding area at [facility], taken to the operating room , appropriate anesthetic monitors  were attached and general endotracheal anesthesia induced. Foley catheter was inserted. Pt was rolled into the R lateral decubitus position and fixed there with a Stulberg Mark II pelvic clamp.  The L lower extremity was  then prepped and draped  in the usual sterile fashion from the ankle to the hemipelvis. A time-out  procedure was performed. The skin along the lateral hip and thigh  infiltrated with 10 mL of 0.5% Marcaine and epinephrine solution. We  then made a posterolateral approach to the hip. With a #10 blade, a 25 cm  incision was made through the skin and subcutaneous tissue down to the level of the  IT band. Small bleeders were identified and cauterized. The IT band was cut in  line with skin incision exposing the greater trochanter. A Cobra retractor was placed between the gluteus minimus and the superior hip joint capsule, and a spiked Cobra between the quadratus femoris and the inferior hip joint capsule. This isolated the short  external rotators and piriformis tendons. These were tagged with a #2 Ethibond  suture and cut off their insertion on the intertrochanteric crest. The posterior  capsule was then developed into an acetabular-based flap from Posterior Superior off of the acetabulum out over the femoral neck and back posterior inferior to the acetabular rim. This flap was tagged with two #2 Ethibond  sutures and retracted protecting the sciatic nerve. This exposed the arthritic femoral head and osteophytes. The hip was then flexed and internally rotated, dislocating the femoral head and a standard neck cut performed 1 fingerbreadth above the lesser trochanter.  A spiked Cobra was placed in the cotyloid notch and a Hohmann retractor was then used to lever the femur anteriorly off of the anterior pelvic column. A posterior-inferior wing retractor was placed at the junction of the acetabulum and the ischium completing the acetabular exposure.We then removed the peripheral osteophytes and labrum from the acetabulum. We then reamed the acetabulum up to 51 mm with basket reamers obtaining good coverage in all quadrants. We then irrigated with normal  saline solution and hammered into place a 52 mm pinnacle  cup in 45  degrees of abduction and about 20 degrees of anteversion. More  peripheral osteophytes removed and a trial 10-degree liner placed with the  index superior-posterior. The hip was then flexed and internally rotated exposing the  proximal femur, which was entered with the initiating reamer followed by  the axial reamers up to a 15.5 mm full depth and 16mm partial depth. We then conically reamed to 20D to the correct depth for a 42 base neck. The calcar was milled to 20DS. A trial cone and stem was inserted in the 25 degrees anteversion, with a +0 36mm trial head. Trial reduction was then performed and excellent stability was noted with at 90 of flexion with 70 of internal rotation and then full extension with maximal external rotation. The hip could not be dislocated in full extension. The knee could easily flex  to about 130 degrees. We also stretched the abductors at this point,  because of the preexisting adductor contractures. All trial components  were then removed. The acetabulum was irrigated out with normal saline  solution. A titanium [facility] was then screwed into place  followed by a 10-degree polyethylene liner index superior-posterior. On  the femoral side a 20DS ZTT1 sleeve was hammered into place, followed by a 20x15x42x160 SROM stem in 25 degrees of anteversion. At this point, a +0 36 mm ceramic head was  hammered on the stem. The hip was reduced. We checked our stability  one more time and found it to be excellent. The wound was once again  thoroughly irrigated out with normal saline solution pulse lavage. The  capsular flap and short external rotators were repaired back to the  intertrochanteric crest through drill holes with a #2 Ethibond suture.  The IT band was closed with running 1 Vicryl suture. The subcutaneous  tissue with 0 and 2-0 undyed Vicryl suture and the skin with running  interlocking 3-0 nylon suture. Dressing of Xeroform and Mepilex was  then  applied. The patient was then unclamped, rolled supine, awaken extubated and taken to recovery room without difficulty in stable condition.   Skeeter Sheard J 10/01/2013, 2:06 PM

## 2013-10-01 NOTE — Interval H&P Note (Signed)
History and Physical Interval Note:  10/01/2013 12:22 PM  [name]  has presented today for surgery, with the diagnosis of LEFT HIP OSTEOARTHRITIS  The various methods of treatment have been discussed with the patient and family. After consideration of risks, benefits and other options for treatment, the patient has consented to  Procedure(s): TOTAL HIP ARTHROPLASTY (Left) as a surgical intervention .  The patient's history has been reviewed, patient examined, no change in status, stable for surgery.  I have reviewed the patient's chart and labs.  Questions were answered to the patient's satisfaction.     [name]

## 2013-10-01 NOTE — Preoperative (Signed)
Beta Blockers   Reason not to administer Beta Blockers:Not Applicable 

## 2013-10-01 NOTE — Transfer of Care (Signed)
Immediate Anesthesia Transfer of Care Note  Patient: [name]  Procedure(s) Performed: Procedure(s): TOTAL HIP ARTHROPLASTY (Left)  Patient Location: PACU  Anesthesia Type:General  Level of Consciousness: awake and patient cooperative  Airway & Oxygen Therapy: Patient Spontanous Breathing and Patient connected to nasal cannula oxygen  Post-op Assessment: Report given to PACU RN, Post -op Vital signs reviewed and stable and Patient moving all extremities X 4  Post vital signs: Reviewed and stable  Complications: No apparent anesthesia complications

## 2013-10-02 ENCOUNTER — Encounter (HOSPITAL_COMMUNITY): Payer: Self-pay | Admitting: General Practice

## 2013-10-02 LAB — URINE CULTURE: Colony Count: >=100000

## 2013-10-02 LAB — BASIC METABOLIC PANEL
BUN: 8 mg/dL (ref 6–23)
CO2: 25 mEq/L (ref 19–32)
Calcium: 9.7 mg/dL (ref 8.4–10.5)
Chloride: 102 mEq/L (ref 96–112)
Glucose, Bld: 155 mg/dL — ABNORMAL HIGH (ref 70–99)
Potassium: 4.2 mEq/L (ref 3.5–5.1)
Sodium: 135 mEq/L (ref 135–145)

## 2013-10-02 LAB — CBC
HCT: 33.7 % — ABNORMAL LOW (ref 36.0–46.0)
Hemoglobin: 10.9 g/dL — ABNORMAL LOW (ref 12.0–15.0)
MCH: 27.7 pg (ref 26.0–34.0)
MCHC: 32.3 g/dL (ref 30.0–36.0)
Platelets: 274 10*3/uL (ref 150–400)
RBC: 3.93 MIL/uL (ref 3.87–5.11)
WBC: 10.2 10*3/uL (ref 4.0–10.5)

## 2013-10-02 MED ORDER — HYDROCODONE-ACETAMINOPHEN 5-325 MG PO TABS
1.0000 | ORAL_TABLET | ORAL | Status: DC | PRN
  Administered 2013-10-02 (×3): 2 via ORAL
  Filled 2013-10-02 (×3): qty 2

## 2013-10-02 MED ORDER — SULFAMETHOXAZOLE-TMP DS 800-160 MG PO TABS
1.0000 | ORAL_TABLET | Freq: Two times a day (BID) | ORAL | Status: DC
  Administered 2013-10-02 – 2013-10-03 (×3): 1 via ORAL
  Filled 2013-10-02 (×4): qty 1

## 2013-10-02 NOTE — Progress Notes (Signed)
Utilization review completed.  

## 2013-10-02 NOTE — Care Management Note (Signed)
CARE MANAGEMENT NOTE 10/02/2013  Patient:  [name], [name]   Account Number:  [id number]  Date Initiated:  10/02/2013  Documentation initiated by:  Vance Peper  Subjective/Objective Assessment:   57 yr old female s/p left total hip arthroplasty.     Action/Plan:   CM spoke with patient concerning home health and DME needs t discharge.  Patient states she will be going to [address], [place]. contact # [phone].  She will have family support at discharge.   Anticipated DC Date:  10/03/2013   Anticipated DC Plan:  HOME W HOME HEALTH SERVICES      DC Planning Services  CM consult      [facility] Choice  HOME HEALTH  DURABLE MEDICAL EQUIPMENT   Choice offered to / List presented to:     DME arranged  3-N-1  Levan Hurst      DME agency  [facility]     HH arranged  HH-2 PT      [facility] agency  [facility]   Status of service:  Completed, signed off  Discharge Disposition:  HOME W HOME HEALTH SERVICES

## 2013-10-02 NOTE — Telephone Encounter (Signed)
Pt never called back. .Audrena Talaga  

## 2013-10-02 NOTE — Progress Notes (Signed)
Patient ID: [name], female   DOB: 03-27-1956, 57 y.o.   MRN: [id number] PATIENT ID: [name]  MRN: [id number]  DOB/AGE:  11-26-1955 / 57 y.o.  1 Day Post-Op Procedure(s) (LRB): TOTAL HIP ARTHROPLASTY (Left)    PROGRESS NOTE Subjective: Patient is alert, oriented,1x Nausea, no Vomiting, yes passing gas, no Bowel Movement. Taking PO well. Denies SOB, Chest or Calf Pain. Using Incentive Spirometer, PAS in place. Ambulate WBAT. The day prior to surgery her urinalysis came back positive for Escherichia coli and she was started on Septra DS which will be continued in the hospital for a total of 7 days. Patient reports pain as 3 on 0-10 scale  .    Objective: Vital signs in last 24 hours: Filed Vitals:   10/01/13 2000 10/01/13 2113 10/02/13 0214 10/02/13 0622  BP:  131/75 128/67 131/80  Pulse:  85 71 85  Temp:  97.4 F (36.3 C) 98.1 F (36.7 C) 98.8 F (37.1 C)  TempSrc:  Oral Oral Oral  Resp: 18 18 18 18   Height:   5\' 2"  (1.575 m)   Weight:   98.884 kg (218 lb)   SpO2: 99% 98% 99% 100%      Intake/Output from previous day: I/O last 3 completed shifts: In: 3361 [P.O.:360; I.V.:3001] Out: 250 [Blood:250]   Intake/Output this shift:     LABORATORY DATA:  Recent Labs  09/30/13 1354 10/02/13 0534  WBC 8.4 10.2  HGB 12.7 10.9*  HCT 38.7 33.7*  PLT 324 274  NA 139 135  K 3.6 4.2  CL 104 102  CO2 24 25  BUN 8 8  CREATININE 0.72 0.77  GLUCOSE 80 155*  INR 1.28  --   CALCIUM 10.8* 9.7    Examination: Neurologically intact ABD soft Neurovascular intact Sensation intact distally Intact pulses distally Dorsiflexion/Plantar flexion intact Incision: scant drainage No cellulitis present Compartment soft} XR AP&Lat of hip shows well placed\fixed THA  Assessment:   1 Day Post-Op Procedure(s) (LRB): TOTAL HIP ARTHROPLASTY (Left) ADDITIONAL DIAGNOSIS:  Hypertension and UTI Preop, patient was started on Septra DS one day before surgery and will continue for 6  days.  Plan: PT/OT WBAT, THA  posterior precautions, Septra DS one by mouth twice a day for 6 days for UTI that was preoperative.  DVT Prophylaxis: SCDx72 hrs, ASA 325 mg BID x 2 weeks  DISCHARGE PLAN: Skilled Nursing Facility/Rehab, prefers [facility]  DISCHARGE NEEDS: HHPT, HHRN, CPM, Walker and 3-in-1 comode seat

## 2013-10-02 NOTE — Progress Notes (Signed)
CSW and Physical therapist met with patient to discuss SNF placement.  CSW informed patient that she will not receive PT/OT at a facility because Medicaid does not pay for these services at a SNF. Patient is agreeable to going home with home health.   Sabino Niemann, MSW, [employer] [phone]

## 2013-10-02 NOTE — Evaluation (Signed)
Physical Therapy Evaluation Patient Details Name: ADELLYN CAPEK MRN: [id number] DOB: 01/02/1956 Today's Date: 10/02/2013 Time: 6962-9528 PT Time Calculation (min): 32 min  PT Assessment / Plan / Recommendation History of Present Illness  Pt is a 57 y/o female admitted s/p L THA posterior approach.   Clinical Impression  This patient presents with acute pain and decreased functional independence following the above mentioned procedure. At the time of PT eval, pt was able to perform transfers and ambulation with min assist and close min guard. Heart burn and nausea were limiting pt throughout beginning of session. This patient is appropriate for skilled PT interventions to address functional limitations, improve safety and independence with functional mobility, and return to PLOF.     PT Assessment  Patient needs continued PT services    Follow Up Recommendations  SNF    Does the patient have the potential to tolerate intense rehabilitation      Barriers to Discharge Decreased caregiver support      Equipment Recommendations  None recommended by PT (Equipment TBD by next venue of care)    Recommendations for Other Services     Frequency 7X/week    Precautions / Restrictions Precautions Precautions: Fall;Posterior Hip Precaution Booklet Issued: Yes (comment) Restrictions Weight Bearing Restrictions: Yes LLE Weight Bearing: Weight bearing as tolerated   Pertinent Vitals/Pain Pt reports minimal pain in LLE during activity, and does not rate pain on 0-10 scale.       Mobility  Bed Mobility Bed Mobility: Not assessed Details for Bed Mobility Assistance: Pt received sitting on BSC. Transfers Transfers: Sit to Stand;Stand to Sit Sit to Stand: 4: Min assist;From chair/3-in-1;From bed;With upper extremity assist Stand to Sit: 4: Min guard;To chair/3-in-1;With upper extremity assist Details for Transfer Assistance: VC's for hand placement and to maintain hip precautions while  performing transfers to/from stand.  Ambulation/Gait Ambulation/Gait Assistance: 4: Min guard Ambulation Distance (Feet): 15 Feet Assistive device: Rolling walker Ambulation/Gait Assistance Details: VC's for sequencing and safety awareness with the RW. Cueing also required to maintain hip precautions while turning.  Gait Pattern: Step-to pattern;Decreased stride length;Trunk flexed Gait velocity: Decreased Stairs: No    Exercises Total Joint Exercises Ankle Circles/Pumps: 10 reps Quad Sets: 10 reps   PT Diagnosis: Difficulty walking;Acute pain  PT Problem List: Decreased strength;Decreased range of motion;Decreased activity tolerance;Decreased balance;Decreased mobility;Decreased knowledge of use of DME;Decreased safety awareness;Decreased knowledge of precautions;Pain PT Treatment Interventions: DME instruction;Gait training;Stair training;Functional mobility training;Therapeutic activities;Therapeutic exercise;Neuromuscular re-education;Patient/family education     PT Goals(Current goals can be found in the care plan section) Acute Rehab PT Goals Patient Stated Goal: To return home after rehab PT Goal Formulation: With patient Time For Goal Achievement: 10/09/13 Potential to Achieve Goals: Good  Visit Information  Last PT Received On: 10/02/13 Assistance Needed: +1 History of Present Illness: Pt is a 57 y/o female admitted s/p L THA posterior approach.        Prior Functioning  Home Living Family/patient expects to be discharged to:: Skilled nursing facility Living Arrangements: Children Prior Function Level of Independence: Independent with assistive device(s) Comments: Was using crutches prior to admission Communication Communication: No difficulties Dominant Hand: Right    Cognition  Cognition Arousal/Alertness: Awake/alert Behavior During Therapy: WFL for tasks assessed/performed Overall Cognitive Status: Within Functional Limits for tasks assessed     Extremity/Trunk Assessment Upper Extremity Assessment Upper Extremity Assessment: Overall WFL for tasks assessed Lower Extremity Assessment Lower Extremity Assessment: LLE deficits/detail LLE Deficits / Details: Decreased strength and AROM consistent  with L THA LLE: Unable to fully assess due to pain Cervical / Trunk Assessment Cervical / Trunk Assessment: Normal   Balance Balance Balance Assessed: Yes Static Sitting Balance Static Sitting - Balance Support: Feet supported;No upper extremity supported Static Sitting - Level of Assistance: 5: Stand by assistance Static Standing Balance Static Standing - Balance Support: Bilateral upper extremity supported Static Standing - Level of Assistance: 5: Stand by assistance Dynamic Standing Balance Dynamic Standing - Balance Support: Left upper extremity supported;During functional activity Dynamic Standing - Level of Assistance: 4: Min assist (min guard during toileting hygiene.) Dynamic Standing - Balance Activities: Lateral lean/weight shifting;Forward lean/weight shifting;Reaching for objects  End of Session PT - End of Session Equipment Utilized During Treatment: Gait belt Activity Tolerance: Patient tolerated treatment well Patient left: in chair;with call bell/phone within reach Nurse Communication: Mobility status  GP     Ruthann Cancer 10/02/2013, 11:41 AM  Ruthann Cancer, PT, DPT [phone]

## 2013-10-02 NOTE — Progress Notes (Signed)
Patient ID: [name], female   DOB: 03/05/1956, 57 y.o.   MRN: [id number] PATIENT ID: [name]  MRN: [id number]  DOB/AGE:  [birth year] / 57 y.o.  1 Day Post-Op Procedure(s) (LRB): TOTAL HIP ARTHROPLASTY (Left)    PROGRESS NOTE Subjective: Patient is alert, oriented,1x Nausea, no Vomiting, yes passing gas, no Bowel Movement. Taking PO well. Denies SOB, Chest or Calf Pain. Using Incentive Spirometer, PAS in place. Ambulate WBAT Patient reports pain as 3 on 0-10 scale  .    Objective: Vital signs in last 24 hours: Filed Vitals:   10/01/13 2000 10/01/13 2113 10/02/13 0214 10/02/13 0622  BP:  131/75 128/67 131/80  Pulse:  85 71 85  Temp:  97.4 F (36.3 C) 98.1 F (36.7 C) 98.8 F (37.1 C)  TempSrc:  Oral Oral Oral  Resp: 18 18 18 18   Height:   5\' 2"  (1.575 m)   Weight:   98.884 kg (218 lb)   SpO2: 99% 98% 99% 100%      Intake/Output from previous day: I/O last 3 completed shifts: In: 3361 [P.O.:360; I.V.:3001] Out: 250 [Blood:250]   Intake/Output this shift:     LABORATORY DATA:  Recent Labs  09/30/13 1354 10/02/13 0534  WBC 8.4 10.2  HGB 12.7 10.9*  HCT 38.7 33.7*  PLT 324 274  NA 139 135  K 3.6 4.2  CL 104 102  CO2 24 25  BUN 8 8  CREATININE 0.72 0.77  GLUCOSE 80 155*  INR 1.28  --   CALCIUM 10.8* 9.7    Examination: Neurologically intact ABD soft Neurovascular intact Sensation intact distally Intact pulses distally Dorsiflexion/Plantar flexion intact Incision: scant drainage No cellulitis present Compartment soft} XR AP&Lat of hip shows well placed\fixed THA  Assessment:   1 Day Post-Op Procedure(s) (LRB): TOTAL HIP ARTHROPLASTY (Left) ADDITIONAL DIAGNOSIS:  Hypertension  Plan: PT/OT WBAT, THA  posterior precautions  DVT Prophylaxis: SCDx72 hrs, ASA 325 mg BID x 2 weeks  DISCHARGE PLAN: Skilled Nursing Facility/Rehab, prefers [facility]  DISCHARGE NEEDS: HHPT, HHRN, CPM, Walker and 3-in-1 comode seat

## 2013-10-02 NOTE — Progress Notes (Signed)
Physical Therapy Treatment Patient Details Name: [name] MRN: [id number] DOB: Mar 29, 1956 Today's Date: 10/02/2013 Time: 7829-5621 PT Time Calculation (min): 40 min  PT Assessment / Plan / Recommendation  History of Present Illness Pt is a 57 y/o female admitted s/p L THA posterior approach.    PT Comments   Pt progressing towards physical therapy goals. PT and CSW met with pt prior to afternoon session regarding d/c disposition, as Medicaid will not cover PT services at SNF. Anticipate that the pt will progress enough for safe d/c home. Pt reports that she will have 3-4 steps to enter the home she will be discharging to. Need clarification for railings and any steps inside the home.   Follow Up Recommendations  Home health PT     Does the patient have the potential to tolerate intense rehabilitation     Barriers to Discharge        Equipment Recommendations  None recommended by PT;Rolling walker with 5" wheels;3in1 (PT) (Equipment TBD by next venue of care)    Recommendations for Other Services    Frequency 7X/week   Progress towards PT Goals Progress towards PT goals: Progressing toward goals  Plan Current plan remains appropriate    Precautions / Restrictions Precautions Precautions: Fall;Posterior Hip Precaution Booklet Issued: Yes (comment) Restrictions Weight Bearing Restrictions: Yes LLE Weight Bearing: Weight bearing as tolerated   Pertinent Vitals/Pain 5/10 at rest    Mobility  Bed Mobility Bed Mobility: Supine to Sit;Sitting - Scoot to Edge of Bed Supine to Sit: 4: Min guard;HOB flat Sitting - Scoot to [place] of Bed: 4: Min guard Details for Bed Mobility Assistance: VC's for sequencing and technique. Transfers Transfers: Sit to Stand;Stand to Sit Sit to Stand: 4: Min assist;From bed;With upper extremity assist Stand to Sit: 4: Min guard;To chair/3-in-1;With upper extremity assist Details for Transfer Assistance: VC's for hand placement and to maintain  hip precautions while performing transfers to/from stand.  Ambulation/Gait Ambulation/Gait Assistance: 4: Min guard Ambulation Distance (Feet): 75 Feet Assistive device: Rolling walker Ambulation/Gait Assistance Details: VC's for sequencing and safety awareness with the RW. Cues for hip precautions during turns.  Gait Pattern: Step-to pattern;Decreased stride length;Trunk flexed Gait velocity: Decreased Stairs: No    Exercises     PT Diagnosis:    PT Problem List:   PT Treatment Interventions:     PT Goals (current goals can now be found in the care plan section) Acute Rehab PT Goals Patient Stated Goal: To return home after rehab PT Goal Formulation: With patient Time For Goal Achievement: 10/09/13 Potential to Achieve Goals: Good  Visit Information  Last PT Received On: 10/02/13 Assistance Needed: +1 History of Present Illness: Pt is a 57 y/o female admitted s/p L THA posterior approach.     Subjective Data  Subjective: "Can I get washed up after we are through?" Patient Stated Goal: To return home after rehab   Cognition  Cognition Arousal/Alertness: Awake/alert Behavior During Therapy: WFL for tasks assessed/performed Overall Cognitive Status: Within Functional Limits for tasks assessed    Balance  Balance Balance Assessed: Yes Static Sitting Balance Static Sitting - Balance Support: Feet supported;No upper extremity supported Static Sitting - Level of Assistance: 5: Stand by assistance Static Standing Balance Static Standing - Balance Support: Bilateral upper extremity supported Static Standing - Level of Assistance: 5: Stand by assistance Dynamic Standing Balance Dynamic Standing - Balance Support: Left upper extremity supported;During functional activity Dynamic Standing - Level of Assistance: 4: Min assist Dynamic Standing -  Balance Activities: Lateral lean/weight shifting;Forward lean/weight shifting;Reaching for objects  End of Session PT - End of  Session Equipment Utilized During Treatment: Gait belt Activity Tolerance: Patient tolerated treatment well Patient left: in chair;with call bell/phone within reach Nurse Communication: Mobility status   GP     Ruthann Cancer 10/02/2013, 4:56 PM  Ruthann Cancer, PT, DPT [phone]

## 2013-10-02 NOTE — Anesthesia Postprocedure Evaluation (Signed)
Anesthesia Post Note  Patient: [name]  Procedure(s) Performed: Procedure(s) (LRB): TOTAL HIP ARTHROPLASTY (Left)  Anesthesia type: General  Patient location: PACU  Post pain: Pain level controlled and Adequate analgesia  Post assessment: Post-op Vital signs reviewed, Patient's Cardiovascular Status Stable, Respiratory Function Stable, Patent Airway and Pain level controlled  Last Vitals:  Filed Vitals:   10/02/13 0622  BP: 131/80  Pulse: 85  Temp: 37.1 C  Resp: 18    Post vital signs: Reviewed and stable  Level of consciousness: awake, alert  and oriented  Complications: No apparent anesthesia complications

## 2013-10-03 LAB — CBC
HCT: 34.6 % — ABNORMAL LOW (ref 36.0–46.0)
Hemoglobin: 11.1 g/dL — ABNORMAL LOW (ref 12.0–15.0)
MCH: 27.1 pg (ref 26.0–34.0)
MCHC: 32.1 g/dL (ref 30.0–36.0)
MCV: 84.4 fL (ref 78.0–100.0)
Platelets: 284 10*3/uL (ref 150–400)
RBC: 4.1 MIL/uL (ref 3.87–5.11)
WBC: 13.2 10*3/uL — ABNORMAL HIGH (ref 4.0–10.5)

## 2013-10-03 MED ORDER — ALUM & MAG HYDROXIDE-SIMETH 200-200-20 MG/5ML PO SUSP
15.0000 mL | Freq: Four times a day (QID) | ORAL | Status: DC | PRN
  Administered 2013-10-03: 15 mL via ORAL
  Filled 2013-10-03: qty 30

## 2013-10-03 MED ORDER — TRAMADOL HCL 50 MG PO TABS
50.0000 mg | ORAL_TABLET | Freq: Four times a day (QID) | ORAL | Status: DC | PRN
  Administered 2013-10-03: 50 mg via ORAL
  Filled 2013-10-03: qty 1

## 2013-10-03 MED ORDER — METHOCARBAMOL 500 MG PO TABS: 500.0000 mg | ORAL_TABLET | Freq: Two times a day (BID) | ORAL | Status: DC

## 2013-10-03 MED ORDER — METOPROLOL TARTRATE 50 MG PO TABS
50.0000 mg | ORAL_TABLET | Freq: Every day | ORAL | Status: DC
  Administered 2013-10-03: 50 mg via ORAL
  Filled 2013-10-03: qty 1

## 2013-10-03 MED ORDER — ASPIRIN EC 325 MG PO TBEC: 325.0000 mg | DELAYED_RELEASE_TABLET | Freq: Two times a day (BID) | ORAL | Status: DC

## 2013-10-03 MED ORDER — TRAMADOL HCL 50 MG PO TABS: 50.0000 mg | ORAL_TABLET | Freq: Four times a day (QID) | ORAL | Status: DC | PRN

## 2013-10-03 NOTE — Progress Notes (Signed)
Physical Therapy Treatment Patient Details Name: [name] MRN: [id number] DOB: 09/03/1956 Today's Date: 10/03/2013 Time:  -     PT Assessment / Plan / Recommendation  History of Present Illness Pt is a 57 y/o female admitted s/p L THA posterior approach.    PT Comments   Pt progressing with mobility.  Ambulated room<>ortho gym & completed stair training this session.  Pt eager to d/c home.     Follow Up Recommendations  Home health PT     Does the patient have the potential to tolerate intense rehabilitation     Barriers to Discharge        Equipment Recommendations  Rolling walker with 5" wheels;3in1 (PT)    Recommendations for Other Services    Frequency 7X/week   Progress towards PT Goals Progress towards PT goals: Progressing toward goals  Plan Current plan remains appropriate    Precautions / Restrictions Precautions Precautions: Fall;Posterior Hip Precaution Comments: Pt able to verbalize 3/3 hip precautions Restrictions Weight Bearing Restrictions: Yes LLE Weight Bearing: Weight bearing as tolerated   Pertinent Vitals/Pain 5/10 Lt hip.  Repositioned for comfort.  Pt does not want pain medication at this time.      Mobility  Bed Mobility Bed Mobility: Not assessed Details for Bed Mobility Assistance: Pt sitting up in recliner upon arrival Transfers Transfers: Sit to Stand;Stand to Sit Sit to Stand: 5: Supervision;With upper extremity assist;With armrests;From chair/3-in-1 Stand to Sit: 5: Supervision;With upper extremity assist;With armrests;To chair/3-in-1 Details for Transfer Assistance: Pt demonstrated safe hand placement Ambulation/Gait Ambulation/Gait Assistance: 5: Supervision Ambulation Distance (Feet): 80 Feet Assistive device: Rolling walker Ambulation/Gait Assistance Details: cues for positioning of body inside RW, increase step/stride length, & increase LLE WBing.   Gait Pattern: Step-through pattern;Decreased stride length Gait velocity:  Decreased Stairs: Yes Stairs Assistance: 4: Min guard Stairs Assistance Details (indicate cue type and reason): cues for sequencing & technique.   Stair Management Technique: Two rails;Step to pattern;Forwards Number of Stairs: 3 Wheelchair Mobility Wheelchair Mobility: No      PT Goals (current goals can now be found in the care plan section) Acute Rehab PT Goals Patient Stated Goal: To return home after rehab PT Goal Formulation: With patient Time For Goal Achievement: 10/09/13 Potential to Achieve Goals: Good  Visit Information  Last PT Received On: 10/03/13 Assistance Needed: +1 History of Present Illness: Pt is a 57 y/o female admitted s/p L THA posterior approach.     Subjective Data  Subjective: "im going home today" Patient Stated Goal: To return home after rehab   Cognition  Cognition Arousal/Alertness: Awake/alert Behavior During Therapy: WFL for tasks assessed/performed Overall Cognitive Status: Within Functional Limits for tasks assessed    Balance     End of Session PT - End of Session Activity Tolerance: Patient tolerated treatment well Patient left: in chair;with call bell/phone within reach Nurse Communication: Mobility status   GP     Lara Mulch 10/03/2013, 10:46 AM   [name], PTA [phone] 10/03/2013

## 2013-10-03 NOTE — Evaluation (Signed)
Occupational Therapy Evaluation Patient Details Name: [name] MRN: [id number] DOB: [birth year] Today's Date: 10/03/2013 Time: 4098-1191 OT Time Calculation (min): 18 min  OT Assessment / Plan / Recommendation History of present illness Pt is a 57 y/o female admitted s/p L THA posterior approach.    Clinical Impression   Pt is at min A level with LB ADLs using A/E and sup with ADL mobility using RW. All education completed and no further acute OT services indicated at this time    OT Assessment  Patient does not need any further OT services    Follow Up Recommendations  Home health OT;Supervision - Intermittent    Barriers to Discharge  none    Equipment Recommendations  3 in 1 bedside comode;Other (comment) (ADL A/E kit)    Recommendations for Other Services    Frequency       Precautions / Restrictions Precautions Precautions: Fall;Posterior Hip Precaution Comments: Pt able to verbalize 3/3 hip precautions Restrictions Weight Bearing Restrictions: Yes LLE Weight Bearing: Weight bearing as tolerated   Pertinent Vitals/Pain 2/10    ADL  Grooming: Performed;Wash/dry hands;Wash/dry face;Supervision/safety;Min guard Where Assessed - Grooming: Supported standing Upper Body Bathing: Simulated;Supervision/safety;Set up Where Assessed - Upper Body Bathing: Unsupported sitting Lower Body Bathing: Simulated;Minimal assistance Upper Body Dressing: Performed;Supervision/safety;Set up Lower Body Dressing: Performed;Minimal assistance Toilet Transfer: Performed;Supervision/safety Toilet Transfer Method: Sit to stand Toilet Transfer Equipment: Regular height toilet;Grab bars;Raised toilet seat with arms (or 3-in-1 over toilet) Toileting - Clothing Manipulation and Hygiene: Performed;Min guard Where Assessed - Glass blower/designer Manipulation and Hygiene: Standing Tub/Shower Transfer: Print production planner: Grab bars;Shower seat without  back;Walk in shower Equipment Used: Rolling walker;Long-handled shoe horn;Long-handled sponge;Reacher;Sock aid Transfers/Ambulation Related to ADLs: Pt demonstrated safe hand placement ADL Comments: Pt provided with education and demo of ADL A/E and was aboe to return demo with min A    OT Diagnosis:    OT Problem List:   OT Treatment Interventions:     OT Goals(Current goals can be found in the care plan section) Acute Rehab OT Goals Patient Stated Goal: to go home  Visit Information  Last OT Received On: 10/03/13 Assistance Needed: +1 History of Present Illness: Pt is a 57 y/o female admitted s/p L THA posterior approach.        Prior Functioning     Home Living Family/patient expects to be discharged to:: Private residence Living Arrangements: Children Available Help at Discharge: Family Type of Home: House Home Access: Stairs to enter Secretary/administrator of Steps: 3 Home Layout: One level Home Equipment: Crutches Prior Function Level of Independence: Independent Communication Communication: No difficulties Dominant Hand: Right         Vision/Perception Vision - History Baseline Vision: Wears glasses only for reading Patient Visual Report: No change from baseline Perception Perception: Within Functional Limits   Cognition  Cognition Arousal/Alertness: Awake/alert Behavior During Therapy: WFL for tasks assessed/performed Overall Cognitive Status: Within Functional Limits for tasks assessed    Extremity/Trunk Assessment Upper Extremity Assessment Upper Extremity Assessment: Overall WFL for tasks assessed Lower Extremity Assessment Lower Extremity Assessment: Defer to PT evaluation Cervical / Trunk Assessment Cervical / Trunk Assessment: Normal     Mobility Bed Mobility Bed Mobility: Not assessed Details for Bed Mobility Assistance: Pt sitting up in recliner upon arrival Transfers Transfers: Sit to Stand;Stand to Sit Sit to Stand: 5:  Supervision;With upper extremity assist;With armrests;From chair/3-in-1;From toilet Stand to Sit: 5: Supervision;With upper extremity assist;With armrests;To chair/3-in-1;To toilet Details for Transfer Assistance:  Pt demonstrated safe hand placement     Exercise     Balance Balance Balance Assessed: Yes Static Sitting Balance Static Sitting - Balance Support: Feet supported;No upper extremity supported Static Sitting - Level of Assistance: 5: Stand by assistance Dynamic Sitting Balance Dynamic Sitting - Balance Support: Feet unsupported;During functional activity Dynamic Sitting - Level of Assistance: 5: Stand by assistance Static Standing Balance Static Standing - Balance Support: Bilateral upper extremity supported Static Standing - Level of Assistance: 5: Stand by assistance Dynamic Standing Balance Dynamic Standing - Balance Support: Left upper extremity supported;During functional activity Dynamic Standing - Level of Assistance: Other (comment) (min guard A) Dynamic Standing - Balance Activities: Lateral lean/weight shifting;Forward lean/weight shifting;Reaching for objects   End of Session OT - End of Session Equipment Utilized During Treatment: Rolling walker;Other (comment) (3 in 1, ADL A/E kit) Activity Tolerance: Patient tolerated treatment well Patient left: in chair;with call bell/phone within reach  GO     Galen Manila 10/03/2013, 11:33 AM

## 2013-10-03 NOTE — Progress Notes (Signed)
PATIENT ID: [name]  MRN: [id number]  DOB/AGE:  1956/01/27 / 57 y.o.  2 Days Post-Op Procedure(s) (LRB): TOTAL HIP ARTHROPLASTY (Left)    PROGRESS NOTE Subjective: Patient is alert, oriented,no Nausea, no Vomiting, yes passing gas, no Bowel Movement. Taking PO well, though pt does complain of indigestion. Denies SOB, Chest or Calf Pain. Using Incentive Spirometer, PAS in place. Ambulate WBAT Patient reports pain as mild and moderate  .    Objective: Vital signs in last 24 hours: Filed Vitals:   10/03/13 0358 10/03/13 0441 10/03/13 0557 10/03/13 0709  BP: 157/108 142/80 140/80 129/84  Pulse: 135 131 134 99  Temp:  99 F (37.2 C)    TempSrc:      Resp:  20    Height:      Weight:      SpO2:  96%        Intake/Output from previous day: I/O last 3 completed shifts: In: 3300.8 [P.O.:720; I.V.:2580.8] Out: -    Intake/Output this shift:     LABORATORY DATA:  Recent Labs  09/30/13 1354 10/02/13 0534  WBC 8.4 10.2  HGB 12.7 10.9*  HCT 38.7 33.7*  PLT 324 274  NA 139 135  K 3.6 4.2  CL 104 102  CO2 24 25  BUN 8 8  CREATININE 0.72 0.77  GLUCOSE 80 155*  INR 1.28  --   CALCIUM 10.8* 9.7    Examination: Neurologically intact Neurovascular intact Sensation intact distally Intact pulses distally Dorsiflexion/Plantar flexion intact Incision: scant drainage No cellulitis present Compartment soft} XR AP&Lat of hip shows well placed\fixed THA  Assessment:   2 Days Post-Op Procedure(s) (LRB): TOTAL HIP ARTHROPLASTY (Left) ADDITIONAL DIAGNOSIS:  Hypertension and UTI Preop, patient was started on Septra DS one day before surgery and will continue for 6 days. Indigestion - Pt written for mylanta prn Pt has continued itchiness with pain medications.  She wishes to try tramadol for her pain.  Orders written.  Plan: PT/OT WBAT, THA  posterior precautions  DVT Prophylaxis: SCDx72 hrs, ASA 325 mg BID x 2 weeks  DISCHARGE PLAN: Home, when pt meets PT  goals.  DISCHARGE NEEDS: HHPT, HHRN, Walker and 3-in-1 comode seat

## 2013-10-03 NOTE — Progress Notes (Signed)
HR 99 and BP 129/84 after metoprolol.

## 2013-10-03 NOTE — Discharge Summary (Signed)
Patient ID: [name] MRN: [id number] DOB/AGE: Jul 29, 1956 57 y.o.  Admit date: 10/01/2013 Discharge date: 10/03/2013  Admission Diagnoses:  Active Problems:   Hip arthritis   Discharge Diagnoses:  Same  Past Medical History  Diagnosis Date  . Hypertension   . Anxiety   . 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 Carpal tunnel syndrome, bilateral     Surgeries: Procedure(s): TOTAL HIP ARTHROPLASTY on 10/01/2013   Consultants:    Discharged Condition: Improved  Hospital Course: [name] is an 57 y.o. female who was admitted 10/01/2013 for operative treatment of<principal problem not specified>. Patient has severe unremitting pain that affects sleep, daily activities, and work/hobbies. After pre-op clearance the patient was taken to the operating room on 10/01/2013 and underwent  Procedure(s): TOTAL HIP ARTHROPLASTY.    Patient was given perioperative antibiotics: Anti-infectives   Start     Dose/Rate Route Frequency Ordered Stop   10/02/13 1100  sulfamethoxazole-trimethoprim (BACTRIM DS) 800-160 MG per tablet 1 tablet     1 tablet Oral Every 12 hours 10/02/13 0837 10/08/13 0959   10/01/13 0611  ceFAZolin (ANCEF) 2-3 GM-% IVPB SOLR    Comments:  [name]   : cabinet override      10/01/13 0611 10/01/13 1250   10/01/13 0600  ceFAZolin (ANCEF) IVPB 2 g/50 mL premix  Status:  Discontinued     2 g 100 mL/hr over 30 Minutes Intravenous On call to O.Mcclure. 09/30/13 1412 10/01/13 1713       Patient was given sequential compression devices, early ambulation, and chemoprophylaxis to prevent DVT.  Patient benefited maximally from hospital stay and there were no complications.    Recent vital signs: Patient Vitals for the past 24 hrs:  BP Temp Temp src Pulse Resp SpO2  10/03/13 0709 129/84 mmHg - - 99 - -  10/03/13 0557 140/80 mmHg - - 134 - -  10/03/13 0441 142/80 mmHg 99 F (37.2 C) - 131 20 96 %  10/03/13 0358 157/108 mmHg - - 135 - -  10/03/13  0351 - - - 127 - -  10/03/13 0347 146/85 mmHg 99.4 F (37.4 C) - 135 18 98 %  10/02/13 2035 174/89 mmHg 98.2 F (36.8 C) - 91 18 99 %  10/02/13 1527 150/83 mmHg 97.6 F (36.4 C) Oral 92 18 99 %     Recent laboratory studies:  Recent Labs  09/30/13 1354 10/02/13 0534  WBC 8.4 10.2  HGB 12.7 10.9*  HCT 38.7 33.7*  PLT 324 274  NA 139 135  K 3.6 4.2  CL 104 102  CO2 24 25  BUN 8 8  CREATININE 0.72 0.77  GLUCOSE 80 155*  INR 1.28  --   CALCIUM 10.8* 9.7     Discharge Medications:     Medication List    STOP taking these medications       ibuprofen 800 MG tablet  Commonly known as:  ADVIL,MOTRIN     meloxicam 15 MG tablet  Commonly known as:  MOBIC      TAKE these medications       aspirin EC 325 MG tablet  Take 1 tablet (325 mg total) by mouth 2 (two) times daily.     hydrochlorothiazide 25 MG tablet  Commonly known as:  HYDRODIURIL  Take 1 tablet (25 mg total) by mouth daily.     loratadine 10 MG tablet  Commonly known as:  CLARITIN  Take 1 tablet (10 mg  total) by mouth daily.     methocarbamol 500 MG tablet  Commonly known as:  ROBAXIN  Take 1 tablet (500 mg total) by mouth 2 (two) times daily with a meal.     metoprolol 50 MG tablet  Commonly known as:  LOPRESSOR  Take 50 mg by mouth daily.     PRESCRIPTION MEDICATION  2 puffs daily as needed ("chest tightness",  wheezing). Inhaler.     traMADol 50 MG tablet  Commonly known as:  ULTRAM  Take 1 tablet (50 mg total) by mouth every 6 (six) hours as needed for moderate pain.        Diagnostic Studies: Dg Chest 2 View  09/30/2013   CLINICAL DATA:  Preop evaluation  EXAM: CHEST  2 VIEW  COMPARISON:  04/09/2006  FINDINGS: The heart size and mediastinal contours are within normal limits. Both lungs are clear. The visualized skeletal structures are unremarkable.  IMPRESSION: No active cardiopulmonary disease.   Electronically Signed   By: Salome Holmes M.D.   On: 09/30/2013 15:49   Dg Pelvis  Portable  10/01/2013   CLINICAL DATA:  Left hip replacement  EXAM: PORTABLE PELVIS 1-2 VIEWS  COMPARISON:  None.  FINDINGS: Left hip replacement in satisfactory position and alignment. No fracture or immediate complication.  IMPRESSION: Satisfactory left hip replacement   Electronically Signed   By: Marlan Palau M.D.   On: 10/01/2013 15:53    Disposition:       Discharge Orders   Future Orders Complete By Expires   Call MD / Call 911  As directed    Comments:     If you experience chest pain or shortness of breath, CALL 911 and be transported to the hospital emergency room.  If you develope a fever above 101 F, pus (white drainage) or increased drainage or redness at the wound, or calf pain, call your surgeon's office.   Change dressing  As directed    Comments:     You may change your dressing on day 5, then change the dressing daily with sterile 4 x 4 inch gauze dressing and paper tape.  You may clean the incision with alcohol prior to redressing   Constipation Prevention  As directed    Comments:     Drink plenty of fluids.  Prune juice may be helpful.  You may use a stool softener, such as Colace (over the counter) 100 mg twice a day.  Use MiraLax (over the counter) for constipation as needed.   Diet - low sodium heart healthy  As directed    Discharge instructions  As directed    Comments:     Follow up in office in 2 weeks with Dr. Turner Mcclure   Driving restrictions  As directed    Comments:     No driving for 2 weeks   Follow the hip precautions as taught in Physical Therapy  As directed    Increase activity slowly as tolerated  As directed    Patient may shower  As directed    Comments:     You may shower without a dressing once there is no drainage.  Do not wash over the wound.  If drainage remains, cover wound with plastic wrap and then shower.      Follow-up Information   Follow up with Beverly Lewandowsky, MD In 2 weeks.   Specialty:  Orthopedic Surgery   Contact information:    [address] [place] [address] [phone]  Signed: PHILLIPS, ERIC Mcclure 10/03/2013, 7:45 AM

## 2013-10-03 NOTE — Progress Notes (Signed)
0347--Patient complaining of "heartburn, heaviness, feel like need to burp."  At rest heart rate noted to be 135 on dinamap.  Apical HR 127 and regular.  BP 146/85  Temp 99.4.  Patient reports her home Rx for metoprolol is for twice a day, but she has only been taking it once a day.    Patient ambulated to BR with assistance and HR remained 135 upon return to bed and BP elevated at 157/108.  0416--EKG obtained with preliminary results: sinus tachycardia at 126; left ventricular hypertrophy (present on pre-op EKG)   Repeat VS: BP 142/80 and HR 131.  0528--Return call from 2nd page to on-call.  Order received to give am metoprolol dose now and continue to monitor.    0605--Metoprolol given.  HR 134 and BP 140/80.  Patient resting quietly in bed without complaint at this time.  Will recheck HR in 1 hour.

## 2013-10-18 ENCOUNTER — Encounter (HOSPITAL_COMMUNITY): Payer: Self-pay | Admitting: Emergency Medicine

## 2013-10-18 ENCOUNTER — Emergency Department (INDEPENDENT_AMBULATORY_CARE_PROVIDER_SITE_OTHER)
Admission: EM | Admit: 2013-10-18 | Discharge: 2013-10-18 | Disposition: A | Discharge status: Home / Self Care | Payer: No Typology Code available for payment source | Source: Home / Self Care | Attending: Family Medicine | Admitting: Family Medicine

## 2013-10-18 DIAGNOSIS — J069 Acute upper respiratory infection, unspecified: Secondary | ICD-10-CM

## 2013-10-18 MED ORDER — IPRATROPIUM BROMIDE 0.06 % NA SOLN: 2.0000 | Freq: Four times a day (QID) | NASAL | Status: DC

## 2013-10-18 NOTE — ED Provider Notes (Signed)
[name] is a 58 y.o. female who presents to Urgent Care today for 4 days of headache runny nose cough congestion body aches. No shortness of breath nausea vomiting or diarrhea. No medications tried. Of note patient recently had a hip replacement surgery and is currently taking Norco for this. Patient feels well otherwise.   Past Medical History  Diagnosis Date  . Hypertension   . Anxiety   . 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 Carpal tunnel syndrome, bilateral    History  Substance Use Topics  . Smoking status: Never Smoker   . Smokeless tobacco: Never Used  . Alcohol Use: No   ROS as above Medications reviewed. No current facility-administered medications for this encounter.   Current Outpatient Prescriptions  Medication Sig Dispense Refill  . aspirin EC 325 MG tablet Take 1 tablet (325 mg total) by mouth 2 (two) times daily.  30 tablet  0  . hydrochlorothiazide (HYDRODIURIL) 25 MG tablet Take 1 tablet (25 mg total) by mouth daily.  30 tablet  6  . ipratropium (ATROVENT) 0.06 % nasal spray Place 2 sprays into both nostrils 4 (four) times daily.  15 mL  1  . loratadine (CLARITIN) 10 MG tablet Take 1 tablet (10 mg total) by mouth daily.  30 tablet  6  . methocarbamol (ROBAXIN) 500 MG tablet Take 1 tablet (500 mg total) by mouth 2 (two) times daily with a meal.  60 tablet  0  . metoprolol (LOPRESSOR) 50 MG tablet Take 50 mg by mouth daily.      Marland Kitchen PRESCRIPTION MEDICATION 2 puffs daily as needed ("chest tightness",  wheezing). Inhaler.      . traMADol (ULTRAM) 50 MG tablet Take 1 tablet (50 mg total) by mouth every 6 (six) hours as needed for moderate pain.  60 tablet  1    Exam:  BP 135/76  Pulse 81  Temp(Src) 98.4 F (36.9 C) (Oral)  SpO2 100% Gen: Well NAD HEENT: EOMI,  MMM posterior pharyngeal cobblestoning. Tympanic membrane is normal appearing bilaterally. Lungs: Normal work of breathing. CTABL Heart: RRR no MRG Abd: NABS, Soft. NT, ND Exts: Non  edematous BL  LE, warm and well perfused.    Assessment and Plan: 58 y.o. female with viral URI with cough. Plan to treat with Atrovent nasal spray. Additionally use over-the-counter NSAIDs for body ache pain fevers and chills. Will use current existing prescription of Norco for cough as needed. Followup if not improving.  Discussed warning signs or symptoms. Please see discharge instructions. Patient expresses understanding.      Gregor Hams, MD 10/18/13 2009

## 2013-10-18 NOTE — ED Notes (Signed)
C/o flu sx States she has chest pain due to coughing, headache, stuffy nose and ear pain States she does have body pain No medications tried.

## 2013-10-18 NOTE — Discharge Instructions (Signed)
Thank you for coming in today. Take 1-2 Aleve twice daily for body aches sore throat pain fevers and chills. Use Atrovent nasal spray as needed for runny nose or postnasal drip. Take one half tablet of Norco pain medicine as needed for cough.  Followup with your primary care provider Call or go to the emergency room if you get worse, have trouble breathing, have chest pains, or palpitations.

## 2013-10-22 ENCOUNTER — Encounter: Payer: Self-pay | Admitting: Gastroenterology

## 2013-10-24 NOTE — ED Notes (Signed)
Returned call from answering machine. Per Dr Georgina Snell, patient is to pay out of pocket or to use nasal saline spray. Spoke w patient , discussed

## 2013-12-16 ENCOUNTER — Telehealth: Payer: Self-pay | Admitting: Emergency Medicine

## 2013-12-16 NOTE — Telephone Encounter (Signed)
1.) called pt to get insurance Info (Medicaid # [id number])  2.) called Dr.Rowan's office and spoke with [facility]. Info given, also ok'd 6 visits and NPI number given. [name], [name]

## 2013-12-16 NOTE — Telephone Encounter (Signed)
Pt has an appt with Dr Larene Pickett at [facility] on [address]. She had hip replacement in Dec.  Her Medicaid was stopped for awhile and she now has it back.  Angie at Drs office needs [place] to call and do a referral so she can be seen Thurs

## 2014-01-05 ENCOUNTER — Ambulatory Visit: Payer: Self-pay | Admitting: Physical Therapy

## 2014-01-15 ENCOUNTER — Encounter: Payer: Self-pay | Admitting: Emergency Medicine

## 2014-01-15 ENCOUNTER — Other Ambulatory Visit: Payer: Self-pay | Admitting: Family Medicine

## 2014-01-15 ENCOUNTER — Ambulatory Visit (INDEPENDENT_AMBULATORY_CARE_PROVIDER_SITE_OTHER): Payer: Medicaid Other | Admitting: Emergency Medicine

## 2014-01-15 VITALS — BP 157/100 | HR 83 | Ht 62.0 in | Wt 225.4 lb

## 2014-01-15 DIAGNOSIS — Z0000 Encounter for general adult medical examination without abnormal findings: Secondary | ICD-10-CM

## 2014-01-15 DIAGNOSIS — I10 Essential (primary) hypertension: Secondary | ICD-10-CM

## 2014-01-15 DIAGNOSIS — M1610 Unilateral primary osteoarthritis, unspecified hip: Secondary | ICD-10-CM

## 2014-01-15 DIAGNOSIS — E0590 Thyrotoxicosis, unspecified without thyrotoxic crisis or storm: Secondary | ICD-10-CM

## 2014-01-15 DIAGNOSIS — F39 Unspecified mood [affective] disorder: Secondary | ICD-10-CM

## 2014-01-15 LAB — COMPLETE METABOLIC PANEL WITH GFR
ALT: 14 U/L (ref 0–35)
AST: 17 U/L (ref 0–37)
Albumin: 3.9 g/dL (ref 3.5–5.2)
Alkaline Phosphatase: 120 U/L — ABNORMAL HIGH (ref 39–117)
BUN: 11 mg/dL (ref 6–23)
CALCIUM: 10.4 mg/dL (ref 8.4–10.5)
CO2: 28 mEq/L (ref 19–32)
CREATININE: 0.7 mg/dL (ref 0.50–1.10)
Chloride: 106 mEq/L (ref 96–112)
GFR, Est African American: >89 mL/min
GFR, Est Non African American: >89 mL/min
Glucose, Bld: 86 mg/dL (ref 70–99)
Potassium: 3.7 mEq/L (ref 3.5–5.3)
SODIUM: 139 meq/L (ref 135–145)
Total Bilirubin: 0.2 mg/dL (ref 0.2–1.2)
Total Protein: 6.7 g/dL (ref 6.0–8.3)

## 2014-01-15 LAB — POCT GLYCOSYLATED HEMOGLOBIN (HGB A1C): Hemoglobin A1C: 5.5

## 2014-01-15 MED ORDER — TRAZODONE HCL 50 MG PO TABS: 25.0000 mg | ORAL_TABLET | Freq: Every evening | ORAL | Status: DC | PRN

## 2014-01-15 MED ORDER — METOPROLOL TARTRATE 50 MG PO TABS: 50.0000 mg | ORAL_TABLET | Freq: Two times a day (BID) | ORAL | Status: DC

## 2014-01-15 NOTE — Assessment & Plan Note (Signed)
Elevated. Continue hctz 25mg  daily. Increase metoprolol to 50mg  BID. F/u in 1 month.

## 2014-01-15 NOTE — Assessment & Plan Note (Signed)
Left hip replacement 09/2013.

## 2014-01-15 NOTE — Progress Notes (Signed)
   Subjective:    Patient ID: [name], female    DOB: 01/20/1956, 58 y.o.   MRN: [id number]  HPI [name] is here for her mood.  She states that her mood has been off since being on Vicodin following her hip replacement in December.  She states that while on the medicine she had "crazy" thoughts and "dark" thoughts, including some passive SI.  She has been off the vicodin since the end of December.  She has also started seeing a counselor once a week.  She states that overall things are getting much better, she is starting to feel more like her normal self.  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  No SI/HI today.  She does state that she has trouble sleeping, both falling asleep and staying asleep.  She started exercising again today and states that that has helped her mood today as well.  Current Outpatient Prescriptions on File Prior to Visit  Medication Sig Dispense Refill  . aspirin EC 325 MG tablet Take 1 tablet (325 mg total) by mouth 2 (two) times daily.  30 tablet  0  . hydrochlorothiazide (HYDRODIURIL) 25 MG tablet Take 1 tablet (25 mg total) by mouth daily.  30 tablet  6  . loratadine (CLARITIN) 10 MG tablet Take 1 tablet (10 mg total) by mouth daily.  30 tablet  6  . methocarbamol (ROBAXIN) 500 MG tablet Take 1 tablet (500 mg total) by mouth 2 (two) times daily with a meal.  60 tablet  0   No current facility-administered medications on file prior to visit.    I have reviewed and updated the following as appropriate: allergies and current medications SHx: never smoker  Health Maintenance: will need to discuss colonoscopy at future visit   Review of Systems  General:  Negative for nexplained weight loss, fever Skin: Negative for new or changing mole, sore that won't heal HEENT: Negative for trouble hearing, trouble seeing, ringing in ears, mouth sores, hoarseness, change in voice, dysphagia. CV:  Negative for chest pain, dyspnea,  edema, palpitations Resp: Negative for +cough, dyspnea, hemoptysis GI: Negative for nausea, vomiting, diarrhea, constipation, +abdominal pain, melena, hematochezia. GU: Negative for dysuria, incontinence, urinary hesitance, hematuria, vaginal or penile discharge, polyuria, sexual difficulty, lumps in testicle or breasts MSK: Negative for muscle cramps or aches, joint pain or swelling Neuro: Negative for +headaches, weakness, numbness, dizziness, passing out/fainting Psych: Negative for depression, +anxiety, memory problems      Objective:   Physical Exam BP 157/100  Pulse 83  Ht 5\' 2"  (1.575 m)  Wt 225 lb 6.4 oz (102.241 kg)  BMI 41.22 kg/m2 Gen: alert, cooperative, NAD HEENT: AT/[place], sclera white, MMM Neck: supple CV: RRR, no murmurs Pulm: CTAB, no wheezes or rales Psych: reports mood as normal; affect consistent     Assessment & Plan:

## 2014-01-15 NOTE — Assessment & Plan Note (Signed)
Check TSH today

## 2014-01-15 NOTE — Assessment & Plan Note (Signed)
>>  ASSESSMENT AND PLAN FOR HIP ARTHRITIS WRITTEN ON 01/15/2014  2:54 PM BY HONIG, ERIN J, MD  Left hip replacement 09/2013.

## 2014-01-15 NOTE — Assessment & Plan Note (Addendum)
?  secondary to medication. PHQ-9: 20, although states symptoms are improving. No active SI/HI. As she reports mood is improving, will continue course. She is seeing a therapist. Start trazadone to help with sleep. F/u in 1 month.

## 2014-01-15 NOTE — Patient Instructions (Signed)
It was nice to see you!  It sounds like everything is going in the right direction. You can take Trazadone 25-50mg  at bedtime to help with sleep.  This is safe to use every night, but I would recommend taking it only as needed.  Please take the metoprolol twice a day.  We are checking some blood work today.  I will call you if anything is wrong, otherwise you will get a letter with the results in the mail in 1-2 weeks.  I will see you back in 1 month to check in.

## 2014-01-16 LAB — TSH: TSH: 0.195 u[IU]/mL — AB (ref 0.350–4.500)

## 2014-01-19 LAB — T4, FREE: Free T4: 0.95 ng/dL (ref 0.80–1.80)

## 2014-01-19 LAB — T3, FREE: T3, Free: 2.6 pg/mL (ref 2.3–4.2)

## 2014-01-20 ENCOUNTER — Other Ambulatory Visit: Payer: Self-pay | Admitting: Emergency Medicine

## 2014-01-20 DIAGNOSIS — Z1231 Encounter for screening mammogram for malignant neoplasm of breast: Secondary | ICD-10-CM

## 2014-01-28 ENCOUNTER — Ambulatory Visit (HOSPITAL_COMMUNITY)
Admission: RE | Admit: 2014-01-28 | Discharge: 2014-01-28 | Disposition: A | Discharge status: Home / Self Care | Payer: Medicaid Other | Source: Ambulatory Visit | Attending: Family Medicine | Admitting: Family Medicine

## 2014-01-28 DIAGNOSIS — Z1231 Encounter for screening mammogram for malignant neoplasm of breast: Secondary | ICD-10-CM | POA: Insufficient documentation

## 2014-02-04 ENCOUNTER — Other Ambulatory Visit: Payer: Self-pay | Admitting: Emergency Medicine

## 2014-03-04 ENCOUNTER — Ambulatory Visit (INDEPENDENT_AMBULATORY_CARE_PROVIDER_SITE_OTHER): Payer: Medicaid Other | Admitting: Emergency Medicine

## 2014-03-04 ENCOUNTER — Encounter: Payer: Self-pay | Admitting: Emergency Medicine

## 2014-03-04 VITALS — BP 139/87 | HR 69 | Ht 62.0 in | Wt 224.0 lb

## 2014-03-04 DIAGNOSIS — R21 Rash and other nonspecific skin eruption: Secondary | ICD-10-CM

## 2014-03-04 DIAGNOSIS — I10 Essential (primary) hypertension: Secondary | ICD-10-CM

## 2014-03-04 DIAGNOSIS — F39 Unspecified mood [affective] disorder: Secondary | ICD-10-CM

## 2014-03-04 MED ORDER — TRIAMCINOLONE ACETONIDE 0.1 % EX CREA: 1.0000 "application " | TOPICAL_CREAM | Freq: Every day | CUTANEOUS | Status: DC

## 2014-03-04 NOTE — Patient Instructions (Signed)
It was nice to see you!  Your blood pressure is excellent today! I sent in a cream for you to use on the rash at bedtime.  It is the same one you had before.  Follow up in 3-4 months for your blood pressure.

## 2014-03-04 NOTE — Assessment & Plan Note (Signed)
Much improved.   Continue to see therapist as needed. Continue with regular exercise.

## 2014-03-04 NOTE — Progress Notes (Signed)
   Subjective:    Patient ID: [name], female    DOB: 05/02/1956, 58 y.o.   MRN: [id number]  HPI [name] is here for followup mood.  Mood States her mood is much improved. Denies feelings of depression. She is still seeing her therapist. She is also going to fitness center 5 times a week, which she finds very helpful.  Rash She has a rough spot on her right dorsal foot. She also states her feet will itch, primarily at night. She used to have a cream that worked very well.  Current Outpatient Prescriptions on File Prior to Visit  Medication Sig Dispense Refill  . albuterol (PROVENTIL HFA;VENTOLIN HFA) 108 (90 BASE) MCG/ACT inhaler Inhale 2 puffs into the lungs every 6 (six) hours as needed for wheezing or shortness of breath.      Marland Kitchen aspirin EC 325 MG tablet Take 1 tablet (325 mg total) by mouth 2 (two) times daily.  30 tablet  0  . EQ LORATADINE 10 MG tablet TAKE ONE TABLET BY MOUTH ONCE DAILY  30 tablet  11  . hydrochlorothiazide (HYDRODIURIL) 25 MG tablet Take 1 tablet (25 mg total) by mouth daily.  30 tablet  6  . methocarbamol (ROBAXIN) 500 MG tablet Take 1 tablet (500 mg total) by mouth 2 (two) times daily with a meal.  60 tablet  0  . metoprolol (LOPRESSOR) 50 MG tablet Take 1 tablet (50 mg total) by mouth 2 (two) times daily.  60 tablet  6  . naproxen sodium (ANAPROX) 220 MG tablet Take 220 mg by mouth 2 (two) times daily with a meal.      . traZODone (DESYREL) 50 MG tablet Take 0.5-1 tablets (25-50 mg total) by mouth at bedtime as needed for sleep.  30 tablet  3   No current facility-administered medications on file prior to visit.    I have reviewed and updated the following as appropriate: allergies and current medications SHx: non smoker  Health Maintenance: up to date on pap and mammogram; due for repeat colonoscopy soon    Review of Systems See HPI    Objective:   Physical Exam BP 139/87  Pulse 69  Ht 5\' 2"  (1.575 m)  Wt 224 lb (101.606 kg)  BMI  40.96 kg/m2 Gen: alert, cooperative, NAD, upbeat Skin: lichenification of dorsal right foot; no other rash seen     Assessment & Plan:

## 2014-03-04 NOTE — Assessment & Plan Note (Signed)
Area of lichenification on right dorsal foot.  No rash seen otherwise. Sent in triamcinolone 0.1% cream to use qHS to prevent itching.

## 2014-03-04 NOTE — Assessment & Plan Note (Signed)
BP improved today. Continue HCTZ and metoprolol. F/u in 3-4 months.

## 2014-04-30 ENCOUNTER — Encounter: Payer: Self-pay | Admitting: Gastroenterology

## 2014-05-13 ENCOUNTER — Other Ambulatory Visit: Payer: Self-pay | Admitting: *Deleted

## 2014-05-13 DIAGNOSIS — I10 Essential (primary) hypertension: Secondary | ICD-10-CM

## 2014-05-13 MED ORDER — HYDROCHLOROTHIAZIDE 25 MG PO TABS: 25.0000 mg | ORAL_TABLET | Freq: Every day | ORAL | Status: DC

## 2014-05-14 NOTE — Telephone Encounter (Signed)
Left message to return call. Please tell patient her Rx was sent to her pharmacy and she needs to schedule a visit with her new PCP within the next 2-3 months.Busick, Kevin Fenton

## 2014-05-15 ENCOUNTER — Ambulatory Visit (INDEPENDENT_AMBULATORY_CARE_PROVIDER_SITE_OTHER): Payer: Medicaid Other | Admitting: Family Medicine

## 2014-05-15 ENCOUNTER — Encounter: Payer: Self-pay | Admitting: Family Medicine

## 2014-05-15 ENCOUNTER — Other Ambulatory Visit (HOSPITAL_COMMUNITY)
Admission: RE | Admit: 2014-05-15 | Discharge: 2014-05-15 | Disposition: A | Discharge status: Home / Self Care | Payer: Medicaid Other | Source: Ambulatory Visit | Attending: Family Medicine | Admitting: Family Medicine

## 2014-05-15 VITALS — BP 122/82 | HR 121 | Temp 98.2°F | Ht 62.0 in | Wt 222.8 lb

## 2014-05-15 DIAGNOSIS — Z113 Encounter for screening for infections with a predominantly sexual mode of transmission: Secondary | ICD-10-CM | POA: Diagnosis present

## 2014-05-15 DIAGNOSIS — R21 Rash and other nonspecific skin eruption: Secondary | ICD-10-CM

## 2014-05-15 DIAGNOSIS — L293 Anogenital pruritus, unspecified: Secondary | ICD-10-CM

## 2014-05-15 DIAGNOSIS — N898 Other specified noninflammatory disorders of vagina: Secondary | ICD-10-CM

## 2014-05-15 DIAGNOSIS — R300 Dysuria: Secondary | ICD-10-CM

## 2014-05-15 LAB — POCT URINALYSIS DIPSTICK
Bilirubin, UA: NEGATIVE
Blood, UA: NEGATIVE
Glucose, UA: NEGATIVE
Ketones, UA: NEGATIVE
LEUKOCYTES UA: NEGATIVE
Nitrite, UA: NEGATIVE
Protein, UA: 100
Spec Grav, UA: >=1.03
Urobilinogen, UA: 0.2
pH, UA: 6

## 2014-05-15 LAB — POCT WET PREP (WET MOUNT): Clue Cells Wet Prep Whiff POC: NEGATIVE

## 2014-05-15 MED ORDER — FLUCONAZOLE 150 MG PO TABS: 150.0000 mg | ORAL_TABLET | Freq: Once | ORAL | Status: DC

## 2014-05-15 MED ORDER — CEPHALEXIN 500 MG PO CAPS: 500.0000 mg | ORAL_CAPSULE | Freq: Two times a day (BID) | ORAL | Status: DC

## 2014-05-15 MED ORDER — TRIAMCINOLONE ACETONIDE 0.5 % EX OINT: 1.0000 "application " | TOPICAL_OINTMENT | Freq: Two times a day (BID) | CUTANEOUS | Status: DC

## 2014-05-15 NOTE — Assessment & Plan Note (Signed)
Likely eczema.  Triamcinolone cream working but not lasting all day. Rx for triamcinolone ointment to be used BID.  Warned patient to watch out for hypopigmentation.

## 2014-05-15 NOTE — Assessment & Plan Note (Signed)
UA was nitrite and leuk esterase negative and no WBCs or bacteria were seen. Pre-test probability is rather high in this older female with history of UTI though. Rx for Keflex twice a day x7 days sent to pharmacy.

## 2014-05-15 NOTE — Progress Notes (Signed)
Subjective:   CC: Meet new PCP, Dysuria  HPI:   1. Dysuria/vaginal itching: Patient reports dysuria and urinary frequency x1wk.  She endorses urinary urgency and pressure in her pelvis. She denies any hematuria, fevers, chills, flank pain.   She had a urinary tract infection last December. She also endorses vaginal itching and increased vaginal discharge that is darker in color than usual. She reports unprotected sexual intercourse approximately one week before the symptoms started.  Patient reports frequent yeast infections, especially after taking antibiotics.  2. Pruritic rash:  Patient reports itching over posterior right calf. She has tried triamcinolone cream that Dr. Bridgett Larsson prescribed to her. This relieves the itching, but she is only using it at night and doesn't feel that the effects last all day.  She is wondering if there is a stronger cream that she could try. She reports dry thickened skin on elbows occasionally as well.  Review of Systems - Per HPI. Additionally, no nausea, vomiting, diarrhea. No chest pain or shortness of breath.  Reviewed PMH, FH, or SH Smoking status: Never smoker    Objective:  Physical Exam BP 122/82  Pulse 121  Temp(Src) 98.2 F (36.8 C) (Oral)  Ht 5\' 2"  (1.575 m)  Wt 222 lb 12.8 oz (101.061 kg)  BMI 40.74 kg/m2 GEN: WDWN, Pleasant female sitting in NAD Neck: No LAD/thyromegaly CV: RRR, no m/r/g. 2+ DP pulses b/l. No JVD Pulm: CTAB, no w/r/c. Normal WOB Abd: Soft, NDNT, no HSM. NABS. No CVA tenderness. Gyn: Thin white vaginal discharge, No cervical motion tenderness, Uterus of normal size, no masses palpated. Ext: 1+ pitting edema bilaterally. No cyanosis or clubbing Skin:  2-3 cm round dry, thickened and dry skin on posterior right calf Neuro: A & O x3, No focal deficits.   UA: Nitrite/LE negative, no bacteria or WBC, 1+ protein.   Lavon Paganini, MD [place]

## 2014-05-15 NOTE — Assessment & Plan Note (Signed)
No concerning signs of STI currently.  Will follow up on results from wet prep, GC/Chlamydia swab, HIV and RPR and treat appropriately. Rx for fluconazole sent for patient to take after treatment with antibiotics, as she reports frequent yeast infections especially after taking antibiotics.

## 2014-05-15 NOTE — Addendum Note (Signed)
Addended by: Virginia Crews on: 05/15/2014 04:32 PM   Modules accepted: Level of Service

## 2014-05-15 NOTE — Patient Instructions (Addendum)
It was great to meet you!  It sounds like you have a UTI.  I am sending a prescription to your pharmacy for Keflex. You will take this twice a day for 7 days. Please make a prescription exactly as prescribed. Do not stop taking the antibiotic early even if you feel that your symptoms have resolved.  I am also prescribing you fluconazole. Take this if you feel that you have gotten a yeast infection after taking the antibiotics.  I am also prescribing you a steroid ointment for your rash.  I think this is likely eczema.  Also try applying aquaphor (available over the counter) for dryness.  We are checking some labs today, and I will call you if they are abnormal. If you do not hear from me with a call or letter in 2 weeks, please call us as I may have been unable to reach you.  As you leave, make an appointment to follow up with me in 3-4 months for your well woman exam.   Take care and seek immediate care sooner if you develop any concerns.  Lavon Paganini, MD ([facility]   Urinary Tract Infection Urinary tract infections (UTIs) can develop anywhere along your urinary tract. Your urinary tract is your body's drainage system for removing wastes and extra water. Your urinary tract includes two kidneys, two ureters, a bladder, and a urethra. Your kidneys are a pair of bean-shaped organs. Each kidney is about the size of your fist. They are located below your ribs, one on each side of your spine. CAUSES Infections are caused by microbes, which are microscopic organisms, including fungi, viruses, and bacteria. These organisms are so small that they can only be seen through a microscope. Bacteria are the microbes that most commonly cause UTIs. SYMPTOMS  Symptoms of UTIs may vary by age and gender of the patient and by the location of the infection. Symptoms in young women typically include a frequent and intense urge to urinate and a painful, burning feeling in the bladder or  urethra during urination. Older women and men are more likely to be tired, shaky, and weak and have muscle aches and abdominal pain. A fever may mean the infection is in your kidneys. Other symptoms of a kidney infection include pain in your back or sides below the ribs, nausea, and vomiting. DIAGNOSIS To diagnose a UTI, your caregiver will ask you about your symptoms. Your caregiver also will ask to provide a urine sample. The urine sample will be tested for bacteria and white blood cells. White blood cells are made by your body to help fight infection. TREATMENT  Typically, UTIs can be treated with medication. Because most UTIs are caused by a bacterial infection, they usually can be treated with the use of antibiotics. The choice of antibiotic and length of treatment depend on your symptoms and the type of bacteria causing your infection. HOME CARE INSTRUCTIONS  If you were prescribed antibiotics, take them exactly as your caregiver instructs you. Finish the medication even if you feel better after you have only taken some of the medication.  Drink enough water and fluids to keep your urine clear or pale yellow.  Avoid caffeine, tea, and carbonated beverages. They tend to irritate your bladder.  Empty your bladder often. Avoid holding urine for long periods of time.  Empty your bladder before and after sexual intercourse.  After a bowel movement, women should cleanse from front to back. Use each tissue only once. SEEK  MEDICAL CARE IF:   You have back pain.  You develop a fever.  Your symptoms do not begin to resolve within 3 days. SEEK IMMEDIATE MEDICAL CARE IF:   You have severe back pain or lower abdominal pain.  You develop chills.  You have nausea or vomiting.  You have continued burning or discomfort with urination. MAKE SURE YOU:   Understand these instructions.  Will watch your condition.  Will get help right away if you are not doing well or get worse. Document  Released: 07/12/2005 Document Revised: 04/02/2012 Document Reviewed: 11/10/2011 [facility] Patient Information 2015 [place], [place]. This information is not intended to replace advice given to you by your health care provider. Make sure you discuss any questions you have with your health care provider.

## 2014-05-16 LAB — HIV ANTIBODY (ROUTINE TESTING W REFLEX): HIV: NONREACTIVE

## 2014-05-16 LAB — RPR

## 2014-05-18 ENCOUNTER — Telehealth: Payer: Self-pay | Admitting: Family Medicine

## 2014-05-18 ENCOUNTER — Encounter: Payer: Self-pay | Admitting: Family Medicine

## 2014-05-18 NOTE — Telephone Encounter (Signed)
Please call patient and let her know that all of her tests were normal.  The urinalysis did not show a UTI, but the symptoms described were convincing for a UTI.  Please have her finish the course of Keflex prescribed.  The tests for HIV, Syphillis, Trichomonas, and yeast infection were negative.  The tests for gonorrhea and chlamydia are not back yet, but someone from the clinic will call if they are positive.

## 2014-05-19 NOTE — Telephone Encounter (Signed)
Pt informed that test results are normal and continue to taking keflex. Onesimo Lingard CMA

## 2014-07-10 ENCOUNTER — Telehealth: Payer: Self-pay | Admitting: Family Medicine

## 2014-07-10 DIAGNOSIS — Z1211 Encounter for screening for malignant neoplasm of colon: Secondary | ICD-10-CM

## 2014-07-10 NOTE — Telephone Encounter (Signed)
Pt called and would like a referral to have a colonoscopy. Please call when done. jw

## 2014-07-13 NOTE — Telephone Encounter (Signed)
Referral placed in [facility], they will contact patient with appt.

## 2014-07-16 ENCOUNTER — Encounter: Payer: Self-pay | Admitting: Gastroenterology

## 2014-07-31 ENCOUNTER — Other Ambulatory Visit: Payer: Self-pay

## 2014-08-21 ENCOUNTER — Ambulatory Visit (INDEPENDENT_AMBULATORY_CARE_PROVIDER_SITE_OTHER): Payer: Medicaid Other | Admitting: Family Medicine

## 2014-08-21 ENCOUNTER — Ambulatory Visit (INDEPENDENT_AMBULATORY_CARE_PROVIDER_SITE_OTHER): Payer: Medicaid Other | Admitting: *Deleted

## 2014-08-21 VITALS — BP 110/82 | HR 88 | Temp 98.2°F | Ht 62.0 in | Wt 220.9 lb

## 2014-08-21 DIAGNOSIS — N644 Mastodynia: Secondary | ICD-10-CM

## 2014-08-21 DIAGNOSIS — Z23 Encounter for immunization: Secondary | ICD-10-CM

## 2014-08-21 DIAGNOSIS — R21 Rash and other nonspecific skin eruption: Secondary | ICD-10-CM

## 2014-08-21 MED ORDER — CLOTRIMAZOLE 1 % EX CREA: 1.0000 "application " | TOPICAL_CREAM | Freq: Two times a day (BID) | CUTANEOUS | Status: AC

## 2014-08-21 NOTE — Patient Instructions (Addendum)
Thank you for coming to see me today. It was a pleasure. Today we talked about:   Breast pain: I think for now we can watch. After reviewing your mammogram and from hearing your story, I think we do not need to do anything right now. If you have anymore bloody discharge, we may need to do a diagnostic mammogram or an ultrasound of your breast. I am including things to look out for if you are concerned about breast cancer. If this continues to be a problem, please return for follow-up.  Rash: I will give you Clotrimazole as you may have a fungal infection. You can use this for 7 days. Please keep your skin well moisturized as well with the baby oil.  Please make an appointment to see your regular  doctor for a follow-up.  If you have any questions or concerns, please do not hesitate to call the office at [phone].  Sincerely,  Cordelia Poche, MD   Breast Cancer Breast cancer is an abnormal growth of tissue (tumor) in the breast that is cancerous (malignant). Unlike noncancerous (benign) tumors, malignant tumors can spread to other parts of your body. The most common type of female breast cancer begins in the milk ducts (ductal carcinoma). Breast cancer is one of the most common types of cancer in women. CAUSES  The exact cause of female breast cancer is unknown.  RISK FACTORS  Age older than 17 years.  Family history of breast cancer.  Having the BRCA1 and BRCA2 genes.  Personal history of radiation exposure.  Obesity.  Menstrual periods that begin before age 48 years.  Menopause that begins after age 74 years.  Pregnant for the first time at the age of 103 years or older.  Using hormone therapy.  Drinking more than one alcoholic drink per day. SIGNS AND SYMPTOMS   A painless lump in your breast.  Changes in the size or shape of your breast.  Breast skin changes, such as puckering or dimpling.  Nipple abnormalities, such as scaling, crustiness, redness, or pulling in  (retraction).  Nipple discharge that is bloody or clear. DIAGNOSIS  Your health care provider will ask about your medical history. He or she may also perform a number of procedures, such as:  A physical exam. This will involve feeling the tissue around the breast and under the arms.  Taking a sample of nipple discharge. The sample will be examined under a microscope.  Breast X-rays (mammogram), breast ultrasound exams, or an MRI.  Taking a tissue sample (biopsy) from the breast. The sample will be examined under a microscope to look for cancer cells. Your cancer will be staged to determine its severity and extent. Staging is a careful attempt to find out the size of the tumor, whether the cancer has spread, and if so, to what parts of the body. You may need to have more tests to determine the stage of your cancer:  Stage 0--The tumor has not spread to other breast tissue.  Stage I--The cancer is only found in the breast. The tumor may be up to  in (2 cm) wide.  Stage II--The cancer has spread to nearby lymph nodes. The tumor may be up to 2 in (5 cm) wide.  Stage III--The cancer has spread to more distant lymph nodes. The tumor may be larger than 2 in (5 cm) wide.  Stage IV--The cancer has spread to other parts of the body, such as the bones, brain, liver, or lungs. TREATMENT  Depending on the type and stage, female breast cancer may be treated with one or more of the following therapies:  Surgery to remove just the tumor (lumpectomy) or the entire breast (mastectomy). Lymph nodes may also be removed.  Radiation therapy, which uses high-energy rays to kill cancer cells.  Chemotherapy, which is the use of drugs to kill cancer cells.  Hormone therapy, which involves taking medicine to adjust the hormone levels in your body. You may take medicine to decrease your estrogen levels. This can help stop cancer cells from growing. HOME CARE INSTRUCTIONS   Take medicines only as directed by  your health care provider.  Maintain a healthy diet.  Consider joining a support group. This may help you learn to cope with the stress of having breast cancer.  Keep all follow-up appointments as directed by your health care provider. SEEK MEDICAL CARE IF:  You have a sudden increase in pain.  You notice a new lump in either breast or under your arm.  You develop swelling in either arm or hand.  You lose weight without trying.  You have a fever.  You notice new fatigue or weakness. SEEK IMMEDIATE MEDICAL CARE IF:   You have chest pain or trouble breathing.  You faint. Document Released: 01/10/2006 Document Revised: 02/16/2014 Document Reviewed: 11/26/2013 [facility] Patient Information 2015 [place], [place]. This information is not intended to replace advice given to you by your health care provider. Make sure you discuss any questions you have with your health care provider.

## 2014-08-21 NOTE — Progress Notes (Signed)
    Subjective    [name] is a 58 y.o. female that presents for an office visit.   1. Breast pain: Three months ago, had an episode where she found dried blood in right bra. She has had right sided intermittent sharp shooting breast pain. Last mammogram in April of 2015 and was normal. PGM has a history of breast cancer. Father has a history of unknown cancer.  2. Itching: Chronic issue. Uses triamcinolone and benadryl. Usually uses baby oil and lotion to help with dry skin. Has a few patches of dry skin on her legs that are especially itchy  History  Substance Use Topics  . Smoking status: Never Smoker   . Smokeless tobacco: Never Used  . Alcohol Use: No    Allergies  Allergen Reactions  . Hydrocodone     Mood disturbance  . Simvastatin     REACTION: muscle aches, GI complaints    No orders of the defined types were placed in this encounter.    ROS  Per HPI   Objective   BP 110/82 mmHg  Pulse 88  Temp(Src) 98.2 F (36.8 C) (Oral)  Ht 5\' 2"  (1.575 m)  Wt 220 lb 14.4 oz (100.2 kg)  BMI 40.39 kg/m2  General: Well appearin Respiratory/Chest: Breasts equal. No abnormal skin changes. No palpable nodules. No tenderness. No nipple discharge. No axillary lymph nodes Skin: Dry skin, with patchy areas of dryness. No erythema.   Assessment and Plan   Please refer to problem based charting of assessment and plan

## 2014-08-22 DIAGNOSIS — N644 Mastodynia: Secondary | ICD-10-CM | POA: Insufficient documentation

## 2014-08-22 DIAGNOSIS — B360 Pityriasis versicolor: Secondary | ICD-10-CM | POA: Insufficient documentation

## 2014-08-22 NOTE — Assessment & Plan Note (Signed)
Appears to be fungal. Will prescribe clotrimazole for treatment and encourage consistent moisturizing.

## 2014-08-22 NOTE — Assessment & Plan Note (Signed)
Patient concerned about breast cancer. No palpable masses felt. One time episode of bleeding with intermittent sharp breast pain. Does not raise concern for malignancy however, patient has family history of breast cancer. Reassurance for now. May need diagnostic mammogram vs ultrasound.

## 2014-09-02 ENCOUNTER — Telehealth: Payer: Self-pay | Admitting: Family Medicine

## 2014-09-02 NOTE — Telephone Encounter (Signed)
Pt needs verification she received the flu shot and what serum it was. This is needed for her job. Please call her when the documentation is ready. She goes for her health screening Friday at 8:15am

## 2014-09-03 NOTE — Telephone Encounter (Signed)
Patient informed that information is ready to be picked up.

## 2014-09-07 ENCOUNTER — Telehealth: Payer: Self-pay | Admitting: Family Medicine

## 2014-09-07 DIAGNOSIS — M1610 Unilateral primary osteoarthritis, unspecified hip: Secondary | ICD-10-CM

## 2014-09-07 NOTE — Telephone Encounter (Signed)
Pt called and needs another referral to [facility]. Please fax to them jw

## 2014-09-08 ENCOUNTER — Ambulatory Visit (AMBULATORY_SURGERY_CENTER): Payer: Self-pay | Admitting: *Deleted

## 2014-09-08 VITALS — Ht 62.0 in | Wt 221.8 lb

## 2014-09-08 DIAGNOSIS — Z86010 Personal history of colonic polyps: Secondary | ICD-10-CM

## 2014-09-08 MED ORDER — MOVIPREP 100 G PO SOLR: 1.0000 | Freq: Once | ORAL | Status: DC

## 2014-09-08 NOTE — Telephone Encounter (Signed)
Referral placed for [facility]  Lavon Paganini, MD, MPH PGY-1,  [facility] 09/08/2014 6:31 PM

## 2014-09-08 NOTE — Progress Notes (Signed)
No egg or soy allergy. ewm No issues with past sedation. ewm No home 02 use, no blood thinners. ewm No diet pills. ewm emmi video to pt's e mail. ewm

## 2014-09-09 NOTE — Telephone Encounter (Signed)
Pt informed. Scheduled at [facility] on Wednesday December 2nd @ 9:45. [place], [place] CMA

## 2014-09-14 ENCOUNTER — Ambulatory Visit (INDEPENDENT_AMBULATORY_CARE_PROVIDER_SITE_OTHER): Payer: Medicaid Other | Admitting: Family Medicine

## 2014-09-14 ENCOUNTER — Encounter: Payer: Self-pay | Admitting: Family Medicine

## 2014-09-14 ENCOUNTER — Telehealth: Payer: Self-pay | Admitting: Family Medicine

## 2014-09-14 VITALS — BP 137/86 | HR 76 | Temp 98.1°F | Wt 226.0 lb

## 2014-09-14 DIAGNOSIS — R05 Cough: Secondary | ICD-10-CM

## 2014-09-14 DIAGNOSIS — H6980 Other specified disorders of Eustachian tube, unspecified ear: Secondary | ICD-10-CM

## 2014-09-14 DIAGNOSIS — J040 Acute laryngitis: Secondary | ICD-10-CM | POA: Insufficient documentation

## 2014-09-14 DIAGNOSIS — R059 Cough, unspecified: Secondary | ICD-10-CM

## 2014-09-14 MED ORDER — FLUTICASONE PROPIONATE 50 MCG/ACT NA SUSP: 1.0000 | Freq: Every day | NASAL | Status: DC

## 2014-09-14 MED ORDER — BENZONATATE 200 MG PO CAPS: 200.0000 mg | ORAL_CAPSULE | Freq: Three times a day (TID) | ORAL | Status: DC | PRN

## 2014-09-14 NOTE — Assessment & Plan Note (Signed)
Likely post viral due to recent URI symptoms - Tessalon pearls given due to persistent cough - Encouraged warm liquids and vocal cord rest

## 2014-09-14 NOTE — Patient Instructions (Signed)
It was great seeing you today.   1. Your hoarseness is most likely due to viral laryngitis 1. Rest your voice as much as possible 2. Use warm liquids and/or honey  3. Use Flonase daily for 1 week to help with your nasal congestion and runny nose 4. Use Tessalon pearls for your cough   If you have any questions or concerns before then, please call the clinic at [phone].  Take Care,   Dr Phill Myron

## 2014-09-14 NOTE — Telephone Encounter (Signed)
Will forward to Dr. Joyner. Jazmin Hartsell,CMA  

## 2014-09-14 NOTE — Telephone Encounter (Signed)
Pt wants to know if there is a generic for tessalon? Says she cannot afford $27 for this prescription, or if there is an alternate?

## 2014-09-14 NOTE — Progress Notes (Signed)
   Subjective:    Patient ID: [name], female    DOB: [birth year], 57 y.o.   MRN: [id number]  Seen for Same day visit for   CC: hoarsness  She reports URI symptoms ~ 1 weeks ago with rhinorrhea, sore throat and cough. Most of those symptoms have resolved but she now has mild throat pain and has lost her voice. 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 Denies fevers, SOB, or CP.    Review of Systems   See HPI for ROS. Objective:  BP 137/86 mmHg  Pulse 76  Temp(Src) 98.1 F (36.7 C) (Oral)  Wt 226 lb (102.513 kg)  General: NAD Eyes:Sclera white; Conjunctiva pink; PERRLA; EOMI Nose: Mucosa pink; No sinus tenderness Throat: Oral mucosa pink, Pharynx w/o exudates Cardiac: RRR, normal heart sounds, no murmurs. 2+ radial and PT pulses bilaterally Respiratory: CTAB, normal effort Abdomen: soft, nontender, nondistended, no hepatic or splenomegaly.     Assessment & Plan:  See Problem List Documentation

## 2014-09-21 ENCOUNTER — Telehealth: Payer: Self-pay | Admitting: Gastroenterology

## 2014-09-21 DIAGNOSIS — Z86010 Personal history of colonic polyps: Secondary | ICD-10-CM

## 2014-09-21 MED ORDER — MOVIPREP 100 G PO SOLR: 1.0000 | Freq: Once | ORAL | Status: DC

## 2014-09-21 NOTE — Telephone Encounter (Signed)
E prescibed movi prep again. Lm on Vm for pt this was done and to call if problems,  Lelan Pons PV

## 2014-09-22 ENCOUNTER — Encounter: Payer: Self-pay | Admitting: Gastroenterology

## 2014-09-22 ENCOUNTER — Ambulatory Visit (AMBULATORY_SURGERY_CENTER): Payer: Medicaid Other | Admitting: Gastroenterology

## 2014-09-22 VITALS — BP 150/93 | HR 59 | Temp 97.2°F | Resp 19 | Ht 62.0 in | Wt 221.0 lb

## 2014-09-22 DIAGNOSIS — D128 Benign neoplasm of rectum: Secondary | ICD-10-CM

## 2014-09-22 DIAGNOSIS — K621 Rectal polyp: Secondary | ICD-10-CM

## 2014-09-22 DIAGNOSIS — Z86010 Personal history of colonic polyps: Secondary | ICD-10-CM

## 2014-09-22 DIAGNOSIS — D129 Benign neoplasm of anus and anal canal: Secondary | ICD-10-CM

## 2014-09-22 MED ORDER — SODIUM CHLORIDE 0.9 % IV SOLN: 500.0000 mL | INTRAVENOUS | Status: DC

## 2014-09-22 NOTE — Progress Notes (Signed)
Report to PACU, RN, vss, BBS= Clear.  

## 2014-09-22 NOTE — Progress Notes (Signed)
Called to room to assist during endoscopic procedure.  Patient ID and intended procedure confirmed with present staff. Received instructions for my participation in the procedure from the performing physician.  

## 2014-09-22 NOTE — Patient Instructions (Signed)
YOU HAD AN ENDOSCOPIC PROCEDURE TODAY AT THE [facility]: Refer to the procedure report that was given to you for any specific questions about what was found during the examination.  If the procedure report does not answer your questions, please call your gastroenterologist to clarify.  If you requested that your care partner not be given the details of your procedure findings, then the procedure report has been included in a sealed envelope for you to review at your convenience later.  YOU SHOULD EXPECT: Some feelings of bloating in the abdomen. Passage of more gas than usual.  Walking can help get rid of the air that was put into your GI tract during the procedure and reduce the bloating. If you had a lower endoscopy (such as a colonoscopy or flexible sigmoidoscopy) you may notice spotting of blood in your stool or on the toilet paper. If you underwent a bowel prep for your procedure, then you may not have a normal bowel movement for a few days.  DIET: Your first meal following the procedure should be a light meal and then it is ok to progress to your normal diet.  A half-sandwich or bowl of soup is an example of a good first meal.  Heavy or fried foods are harder to digest and may make you feel nauseous or bloated.  Likewise meals heavy in dairy and vegetables can cause extra gas to form and this can also increase the bloating.  Drink plenty of fluids but you should avoid alcoholic beverages for 24 hours.  ACTIVITY: Your care partner should take you home directly after the procedure.  You should plan to take it easy, moving slowly for the rest of the day.  You can resume normal activity the day after the procedure however you should NOT DRIVE or use heavy machinery for 24 hours (because of the sedation medicines used during the test).    SYMPTOMS TO REPORT IMMEDIATELY: A gastroenterologist can be reached at any hour.  During normal business hours, 8:30 AM to 5:00 PM Monday through Friday,  call [phone].  After hours and on weekends, please call the GI answering service at [phone] who will take a message and have the physician on call contact you.   Following lower endoscopy (colonoscopy or flexible sigmoidoscopy):  Excessive amounts of blood in the stool  Significant tenderness or worsening of abdominal pains  Swelling of the abdomen that is new, acute  Fever of 100F or higher    FOLLOW UP: If any biopsies were taken you will be contacted by phone or by letter within the next 1-3 weeks.  Call your gastroenterologist if you have not heard about the biopsies in 3 weeks.  Our staff will call the home number listed on your records the next business day following your procedure to check on you and address any questions or concerns that you may have at that time regarding the information given to you following your procedure. This is a courtesy call and so if there is no answer at the home number and we have not heard from you through the emergency physician on call, we will assume that you have returned to your regular daily activities without incident.  SIGNATURES/CONFIDENTIALITY: You and/or your care partner have signed paperwork which will be entered into your electronic medical record.  These signatures attest to the fact that that the information above on your After Visit Summary has been reviewed and is understood.  Full responsibility of the confidentiality   of this discharge information lies with you and/or your care-partner.     

## 2014-09-23 ENCOUNTER — Other Ambulatory Visit: Payer: Self-pay | Admitting: Family Medicine

## 2014-09-23 ENCOUNTER — Telehealth: Payer: Self-pay | Admitting: *Deleted

## 2014-09-23 NOTE — Telephone Encounter (Signed)
  Follow up Call-  Call back number 09/22/2014  Post procedure Call Back phone  # [phone]  Permission to leave phone message Yes     Patient questions:  Do you have a fever, pain , or abdominal swelling? No. Pain Score  0 *  Have you tolerated food without any problems? Yes.    Have you been able to return to your normal activities? Yes.    Do you have any questions about your discharge instructions: Diet   No. Medications  No. Follow up visit  No.  Do you have questions or concerns about your Care? No.  Actions: * If pain score is 4 or above: No action needed, pain <4.

## 2014-09-23 NOTE — Op Note (Signed)
[facility]  [address]. [place], [address]   COLONOSCOPY PROCEDURE REPORT  PATIENT: [name], [name]  MR#: [id number] BIRTHDATE: 07-13-1956 , 32  yrs. old GENDER: female ENDOSCOPIST: Ladene Artist, MD, [facility] PROCEDURE DATE:  09/22/2014 PROCEDURE:   Colonoscopy with biopsy First Screening Colonoscopy - Avg.  risk and is 50 yrs.  old or older - No.  Prior Negative Screening - Now for repeat screening. N/A  History of Adenoma - Now for follow-up colonoscopy & has been > or = to 3 yrs.  Yes hx of adenoma.  Has been 3 or more years since last colonoscopy.  Polyps Removed Today? Yes. ASA CLASS:   Class III INDICATIONS:surveillance colonoscopy based on a history of adenomatous colonic polyp(s). MEDICATIONS: Monitored anesthesia care and Propofol 200 mg IV DESCRIPTION OF PROCEDURE:   After the risks benefits and alternatives of the procedure were thoroughly explained, informed consent was obtained.  The digital rectal exam revealed no abnormalities of the rectum.   The LB [id number]  endoscope was introduced through the anus and advanced to the cecum, which was identified by both the appendix and ileocecal valve. No adverse events experienced.   The quality of the prep was excellent, using MoviPrep  The instrument was then slowly withdrawn as the colon was fully examined.  COLON FINDINGS: A sessile polyp measuring 4 mm in size was found in the rectum.  A polypectomy was performed with cold forceps.  The resection was complete, the polyp tissue was completely retrieved and sent to histology.   The examination was otherwise normal. Retroflexed views revealed internal Grade I hemorrhoids. The time to cecum=4 minutes 44 seconds.  Withdrawal time=8 minutes 51 seconds.  The scope was withdrawn and the procedure completed. COMPLICATIONS: There were no immediate complications.  ENDOSCOPIC IMPRESSION: 1.   Sessile polyp in the rectum; polypectomy performed with  cold forceps 2.   Grade l internal hemorrhoids  RECOMMENDATIONS: 1.  Await pathology results 2.  Repeat Colonoscopy in 5 years.  eSigned:  Ladene Artist, MD, [facility] 09/22/2014 10:36 AM

## 2014-09-24 ENCOUNTER — Other Ambulatory Visit: Payer: Self-pay | Admitting: Family Medicine

## 2014-09-24 NOTE — Telephone Encounter (Signed)
Needs refill on albuterol inhaler Would like a higher dosage of the cream given for skin itch

## 2014-09-24 NOTE — Telephone Encounter (Signed)
LVM for patient to call back to inform her of below

## 2014-09-26 MED ORDER — ALBUTEROL SULFATE HFA 108 (90 BASE) MCG/ACT IN AERS: 2.0000 | INHALATION_SPRAY | Freq: Four times a day (QID) | RESPIRATORY_TRACT | Status: DC | PRN

## 2014-09-26 NOTE — Telephone Encounter (Signed)
Refilled albuterol inhaler.  Unsure if patient is talking about clotrimazole or triamcinolone cream for itching.  Lavon Paganini, MD, MPH PGY-1,  [facility] 09/26/2014 8:53 PM

## 2014-09-29 ENCOUNTER — Encounter: Payer: Self-pay | Admitting: Gastroenterology

## 2014-09-29 ENCOUNTER — Telehealth: Payer: Self-pay | Admitting: Family Medicine

## 2014-09-29 MED ORDER — CLOTRIMAZOLE 1 % EX CREA: 1.0000 "application " | TOPICAL_CREAM | Freq: Two times a day (BID) | CUTANEOUS | Status: DC

## 2014-09-29 NOTE — Telephone Encounter (Signed)
Pt called to let the doctors know that the cream she is requesting is lotrimin. Blima Rich

## 2014-09-29 NOTE — Telephone Encounter (Signed)
Left message on voicemail for patient to call back. 

## 2014-09-29 NOTE — Telephone Encounter (Signed)
Sent Rx to pharmacy for Lotrimin cream.  Lavon Paganini, MD, MPH PGY-1,  [facility] 09/29/2014 11:27 PM

## 2014-09-30 NOTE — Telephone Encounter (Signed)
Left message on voicemail informing that rx had been sent in. 

## 2014-10-05 ENCOUNTER — Ambulatory Visit (INDEPENDENT_AMBULATORY_CARE_PROVIDER_SITE_OTHER): Payer: Medicaid Other | Admitting: Family Medicine

## 2014-10-05 ENCOUNTER — Encounter: Payer: Self-pay | Admitting: Family Medicine

## 2014-10-05 VITALS — BP 138/88 | HR 88 | Temp 98.6°F | Ht 62.0 in | Wt 218.3 lb

## 2014-10-05 DIAGNOSIS — H6980 Other specified disorders of Eustachian tube, unspecified ear: Secondary | ICD-10-CM

## 2014-10-05 DIAGNOSIS — J301 Allergic rhinitis due to pollen: Secondary | ICD-10-CM

## 2014-10-05 DIAGNOSIS — B360 Pityriasis versicolor: Secondary | ICD-10-CM

## 2014-10-05 MED ORDER — FLUTICASONE PROPIONATE 50 MCG/ACT NA SUSP: 1.0000 | Freq: Two times a day (BID) | NASAL | Status: DC

## 2014-10-05 NOTE — Assessment & Plan Note (Signed)
Continues to have postnasal drip. Improving with Flonase daily. Increase Flonase to 1 spray each nostril twice a day. Continue Claritin daily.

## 2014-10-05 NOTE — Assessment & Plan Note (Signed)
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 Improving with Lotrimin cream. Continue Lotrimin cream twice a day and apply Aquaphor ointment twice a day as well.

## 2014-10-05 NOTE — Progress Notes (Signed)
   Subjective:   [name] is a 58 y.o. female with a history of allergic rhinitis, postnasal drip, eustachian tube dysfunction, fungal rash here for follow-up.  1. Rash: Originally tried triamcinolone cream without improvement. Has been using Lotrimin cream twice a day since November with some improvement in rash. Notes that it is still itchy between Lotrimin applications.  2. Allergic rhinitis, postnasal drip, eustachian tube dysfunction: Patient started on Flonase 1 spray each nostril daily in November. This has improved her allergic symptoms and eustachian tube dysfunction. She continues to have some postnasal drip.  Review of Systems:  Per HPI. All other systems reviewed and are negative.   PMH, PSH, Medications, Allergies, and FmHx reviewed and updated in EMR.  Social History: Never smoker  Objective:  BP 138/88 mmHg  Pulse 88  Temp(Src) 98.6 F (37 C) (Oral)  Ht 5\' 2"  (1.575 m)  Wt 218 lb 4.8 oz (99.02 kg)  BMI 39.92 kg/m2  Gen:  58 y.o. female in NAD HEENT: NCAT, MMM, EOMI, PERRL, anicteric sclerae, inflamed nasal turbinates CV: RRR, no MRG Resp: Non-labored, CTAB, no wheezes noted Ext: WWP, no edema, hyperpigmented rash, not raised over right ankle Neuro: Alert and oriented, speech normal    Assessment:     [name] is a 58 y.o. female here for follow-up of allergic rhinitis, postnasal drip, fungal rash.    Plan:     See problem list for problem-specific plans.   Lavon Paganini, MD PGY-1,  [facility] 10/05/2014  4:46 PM

## 2014-10-05 NOTE — Patient Instructions (Signed)
It was nice to see you again today. Keep using the Lotrimin cream twice a day for the rash. You can also apply Aquaphor over top of the Lotrimin to help with moisture.  Postnasal drip, I will increase her Flonase to 1 spray each stroke twice daily. Continue thyroid these are not improving.  Take care, Dr. Jacinto Reap  Clotrimazole skin cream, lotion, or solution What is this medicine? CLOTRIMAZOLE (kloe TRIM a zole) is an antifungal medicine. It is used to treat certain kinds of fungal or yeast infections of the skin. This medicine may be used for other purposes; ask your health care provider or pharmacist if you have questions. COMMON BRAND NAME(S): Anti-Fungal, Antifungal, Cruex, Desenex, Fungoid, Lotrimin, Lotrimin AF, Lotrimin AF Ringworm What should I tell my health care provider before I take this medicine? They need to know if you have any of these conditions: -an unusual or allergic reaction to clotrimazole, other antifungals or medicines, foods, dyes or preservatives -pregnant or trying to get pregnant -breast-feeding How should I use this medicine? This medicine is for external use only. Do not take by mouth. Follow the directions on the prescription label. Wash your hands before and after use. If treating a hand or nail infection, wash hands before use only. Apply a thin layer to the affected area and a small amount to the surrounding area. Rub in gently. Do not get this medicine in your eyes. If you do, rinse out with plenty of cool tap water. Use this medicine at regular intervals. Do not use more often than directed. Finish the full course prescribed by your doctor or health care professional even if you think you are better. Do not stop using except on your doctor's advice. Talk to your pediatrician regarding the use of this medicine in children. While this drug has been used in young children for selected conditions, precautions do apply. Overdosage: If you think you have taken too much of  this medicine contact a poison control center or emergency room at once. NOTE: This medicine is only for you. Do not share this medicine with others. What if I miss a dose? If you miss a dose, use it as soon as you can. If it is almost time for your next dose, use only that dose. Do not use double or extra doses. What may interact with this medicine? -amphotericin b -topical products that have nystatin This list may not describe all possible interactions. Give your health care provider a list of all the medicines, herbs, non-prescription drugs, or dietary supplements you use. Also tell them if you smoke, drink alcohol, or use illegal drugs. Some items may interact with your medicine. What should I watch for while using this medicine? Tell your doctor or health care professional if your symptoms do not start to improve after 7 days. Do not self-medicate for more than one week. If you are using this medicine for 'jock itch' be sure to dry the groin completely after bathing. Do not wear underwear that is tight-fitting or made from synthetic fibers like rayon or nylon. Wear loose-fitting, cotton underwear. If you are using this medicine for athlete's foot be sure to dry your feet carefully after bathing, especially between the toes. Do not wear socks made from wool or synthetic materials like rayon or nylon. Wear clean cotton socks and change them at least once a day, change them more if your feet sweat a lot. Also, try to wear sandals or shoes that are well-ventilated. What side effects  may I notice from receiving this medicine? Side effects that usually do not require medical attention (report to your doctor or health care professional if they continue or are bothersome): -allergic reactions like skin rash, itching or hives, swelling of the face, lips, or tongue -skin irritation, burning This list may not describe all possible side effects. Call your doctor for medical advice about side effects. You  may report side effects to FDA at 1-800-FDA-1088. Where should I keep my medicine? Keep out of the reach of children. Store at room temperature between 2 to 30 degrees C (36 to 86 degrees F). Do not freeze. Throw away any unused medicine after the expiration date. NOTE: This sheet is a summary. It may not cover all possible information. If you have questions about this medicine, talk to your doctor, pharmacist, or health care provider.  2015, Elsevier/Gold Standard. (2008-01-06 16:53:51)

## 2014-12-25 ENCOUNTER — Other Ambulatory Visit: Payer: Self-pay | Admitting: Family Medicine

## 2015-01-26 ENCOUNTER — Other Ambulatory Visit: Payer: Self-pay | Admitting: Family Medicine

## 2015-01-29 ENCOUNTER — Other Ambulatory Visit: Payer: Self-pay | Admitting: Family Medicine

## 2015-01-29 MED ORDER — METOPROLOL TARTRATE 50 MG PO TABS: 50.0000 mg | ORAL_TABLET | Freq: Two times a day (BID) | ORAL | Status: DC

## 2015-01-29 NOTE — Telephone Encounter (Signed)
Pt is checking status of rx for metoprolol, is completely out, pt goes to [facility].

## 2015-02-25 ENCOUNTER — Telehealth: Payer: Self-pay | Admitting: *Deleted

## 2015-02-25 MED ORDER — LORATADINE 10 MG PO TABS: 10.0000 mg | ORAL_TABLET | Freq: Every day | ORAL | Status: DC

## 2015-02-25 NOTE — Telephone Encounter (Signed)
Refill sent to pharmacy.  Virginia Crews, MD, MPH PGY-1,  [facility] 02/25/2015 12:09 PM

## 2015-02-25 NOTE — Telephone Encounter (Signed)
Pharmacy requesting refill on loratadine 10 mg # 30--take one tablet daily.  Last refilled 01/24/15.  Med not on patient's med list.  Will route request to PCP.  Burna Forts, BSN, RN-BC

## 2015-04-11 ENCOUNTER — Emergency Department (INDEPENDENT_AMBULATORY_CARE_PROVIDER_SITE_OTHER)
Admission: EM | Admit: 2015-04-11 | Discharge: 2015-04-11 | Disposition: A | Discharge status: Home / Self Care | Payer: Commercial Managed Care - HMO | Source: Home / Self Care | Attending: Family Medicine | Admitting: Family Medicine

## 2015-04-11 ENCOUNTER — Encounter (HOSPITAL_COMMUNITY): Payer: Self-pay | Admitting: *Deleted

## 2015-04-11 DIAGNOSIS — J069 Acute upper respiratory infection, unspecified: Secondary | ICD-10-CM | POA: Diagnosis not present

## 2015-04-11 MED ORDER — DEXTROMETHORPHAN POLISTIREX ER 30 MG/5ML PO SUER: 60.0000 mg | Freq: Two times a day (BID) | ORAL | Status: DC

## 2015-04-11 MED ORDER — IPRATROPIUM BROMIDE 0.06 % NA SOLN: 2.0000 | Freq: Four times a day (QID) | NASAL | Status: DC

## 2015-04-11 NOTE — Discharge Instructions (Signed)
Drink plenty of fluids as discussed, use medicine as prescribed, and mucinex or delsym for cough. Return or see your doctor if further problems °

## 2015-04-11 NOTE — ED Notes (Signed)
Pt  Reports  Symptoms  Of  Cough   /  Congested       sorethroat  As    Well  As  A  Cough  With  Onset  X  sev  Days            Pt reports   She  Has  Used    Inhalers    Without  releif

## 2015-04-11 NOTE — ED Provider Notes (Signed)
CSN: [id number]     Arrival date & time 04/11/15  1304 History   None    Chief Complaint  Patient presents with  . Sore Throat   (Consider location/radiation/quality/duration/timing/severity/associated sxs/prior Treatment) Patient is a 59 y.o. female presenting with pharyngitis. The history is provided by the patient.  Sore Throat This is a new problem. The current episode started more than 2 days ago. The problem has been gradually worsening.    Past Medical History  Diagnosis Date  . Hypertension   . Anxiety   . 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 Carpal tunnel syndrome, bilateral   . Allergy   . Heart murmur     age 78 said had heart murmur.   Past Surgical History  Procedure Laterality Date  . 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 Colonoscopy    . Polypectomy     Family History  Problem Relation Age of Onset  . Hypertension Mother   . Diabetes Father   . Hyperlipidemia Brother   . Heart attack Neg Hx   . Sudden death Neg Hx   . Colon cancer Neg Hx    History  Substance Use Topics  . Smoking status: Never Smoker   . Smokeless tobacco: Never Used  . Alcohol Use: No   OB History    No data available     Review of Systems  Constitutional: Negative for fever and chills.  HENT: Positive for congestion, postnasal drip and rhinorrhea. Negative for sore throat.   Respiratory: Negative.   Gastrointestinal: Negative.     Allergies  Hydrocodone and Simvastatin  Home Medications   Prior to Admission medications   Medication Sig Start Date End Date Taking? Authorizing Provider  albuterol (PROVENTIL HFA;VENTOLIN HFA) 108 (90 BASE) MCG/ACT inhaler Inhale 2 puffs into the lungs every 6 (six) hours as needed for wheezing or shortness of breath. 09/26/14   Virginia Crews, MD  aspirin EC 325 MG tablet Take 1 tablet (325 mg total) by mouth 2 (two) times daily. 10/03/13   Leighton Parody, PA-C  benzonatate (TESSALON) 200 MG capsule Take 1 capsule (200 mg total) by mouth 3 (three) times daily as needed for cough. 09/14/14   Olam Idler, MD  clotrimazole (LOTRIMIN) 1 % cream Apply 1 application topically 2 (two) times daily. 09/29/14   Virginia Crews, MD  dextromethorphan (DELSYM) 30 MG/5ML liquid Take 10 mLs (60 mg total) by mouth 2 (two) times daily. 04/11/15   Billy Fischer, MD  fluticasone (FLONASE) 50 MCG/ACT nasal spray Place 1 spray into both nostrils 2 (two) times daily. 10/05/14   Virginia Crews, MD  hydrochlorothiazide (HYDRODIURIL) 25 MG tablet TAKE ONE TABLET BY MOUTH ONCE DAILY 12/25/14   Virginia Crews, MD  ipratropium (ATROVENT) 0.06 % nasal spray Place 2 sprays into both nostrils 4 (four) times daily. 04/11/15   Billy Fischer, MD  loratadine (EQ LORATADINE) 10 MG tablet Take 1 tablet (10 mg total) by mouth daily. 02/25/15   Virginia Crews, MD  metoprolol (LOPRESSOR) 50 MG tablet Take 1 tablet (50 mg total) by mouth 2 (two) times daily. 01/29/15   Virginia Crews, MD  naproxen sodium (ANAPROX) 220 MG tablet Take 220 mg by mouth 2 (  two) times daily with a meal.    Historical Provider, MD  PRESCRIPTION MEDICATION Cream for skin, unknown name to pt.    Historical Provider, MD  traZODone (DESYREL) 50 MG tablet Take 0.5-1 tablets (25-50 mg total) by mouth at bedtime as needed for sleep. Patient not taking: Reported on 09/08/2014 01/15/14   Melony Overly, MD   BP 162/96 mmHg  Pulse 74  Temp(Src) 98.6 F (37 C) (Oral)  Resp 16  SpO2 99% Physical Exam  Constitutional: She is oriented to person, place, and time. She appears well-developed and well-nourished.  HENT:  Head: Normocephalic.  Right Ear: External ear normal.  Left Ear: External ear normal.  Mouth/Throat: Oropharynx is clear and moist.  Eyes: Pupils are equal,  round, and reactive to light.  Neck: Normal range of motion. Neck supple.  Cardiovascular: Normal rate, regular rhythm, normal heart sounds and intact distal pulses.   Pulmonary/Chest: Effort normal and breath sounds normal.  Lymphadenopathy:    She has no cervical adenopathy.  Neurological: She is alert and oriented to person, place, and time.  Skin: Skin is warm and dry.  Nursing note and vitals reviewed.   ED Course  Procedures (including critical care time) Labs Review Labs Reviewed - No data to display  Imaging Review No results found.   MDM   1. URI (upper respiratory infection)       Billy Fischer, MD 04/11/15 [phone]

## 2015-04-12 ENCOUNTER — Other Ambulatory Visit: Payer: Self-pay | Admitting: Family Medicine

## 2015-04-12 ENCOUNTER — Other Ambulatory Visit (HOSPITAL_COMMUNITY): Payer: Self-pay | Admitting: Emergency Medicine

## 2015-04-12 DIAGNOSIS — Z1231 Encounter for screening mammogram for malignant neoplasm of breast: Secondary | ICD-10-CM

## 2015-04-21 ENCOUNTER — Ambulatory Visit (HOSPITAL_COMMUNITY)
Admission: RE | Admit: 2015-04-21 | Discharge: 2015-04-21 | Disposition: A | Discharge status: Home / Self Care | Payer: Commercial Managed Care - HMO | Source: Ambulatory Visit | Attending: Internal Medicine | Admitting: Internal Medicine

## 2015-04-21 DIAGNOSIS — Z1231 Encounter for screening mammogram for malignant neoplasm of breast: Secondary | ICD-10-CM | POA: Diagnosis not present

## 2015-05-03 ENCOUNTER — Other Ambulatory Visit (HOSPITAL_COMMUNITY)
Admission: RE | Admit: 2015-05-03 | Discharge: 2015-05-03 | Disposition: A | Discharge status: Home / Self Care | Payer: Commercial Managed Care - HMO | Source: Ambulatory Visit | Attending: Family Medicine | Admitting: Family Medicine

## 2015-05-03 ENCOUNTER — Ambulatory Visit (HOSPITAL_COMMUNITY)
Admission: RE | Admit: 2015-05-03 | Discharge: 2015-05-03 | Disposition: A | Discharge status: Home / Self Care | Payer: Commercial Managed Care - HMO | Source: Ambulatory Visit | Attending: Internal Medicine | Admitting: Internal Medicine

## 2015-05-03 ENCOUNTER — Ambulatory Visit (INDEPENDENT_AMBULATORY_CARE_PROVIDER_SITE_OTHER): Payer: Commercial Managed Care - HMO | Admitting: Family Medicine

## 2015-05-03 ENCOUNTER — Encounter: Payer: Self-pay | Admitting: Family Medicine

## 2015-05-03 VITALS — BP 112/72 | HR 79 | Temp 98.6°F | Ht 62.0 in | Wt 222.0 lb

## 2015-05-03 DIAGNOSIS — E669 Obesity, unspecified: Secondary | ICD-10-CM | POA: Diagnosis not present

## 2015-05-03 DIAGNOSIS — R079 Chest pain, unspecified: Secondary | ICD-10-CM

## 2015-05-03 DIAGNOSIS — E785 Hyperlipidemia, unspecified: Secondary | ICD-10-CM | POA: Diagnosis not present

## 2015-05-03 DIAGNOSIS — Z124 Encounter for screening for malignant neoplasm of cervix: Secondary | ICD-10-CM | POA: Insufficient documentation

## 2015-05-03 DIAGNOSIS — Z20828 Contact with and (suspected) exposure to other viral communicable diseases: Secondary | ICD-10-CM

## 2015-05-03 DIAGNOSIS — E0590 Thyrotoxicosis, unspecified without thyrotoxic crisis or storm: Secondary | ICD-10-CM

## 2015-05-03 DIAGNOSIS — Z202 Contact with and (suspected) exposure to infections with a predominantly sexual mode of transmission: Secondary | ICD-10-CM

## 2015-05-03 DIAGNOSIS — N898 Other specified noninflammatory disorders of vagina: Secondary | ICD-10-CM

## 2015-05-03 DIAGNOSIS — Z1151 Encounter for screening for human papillomavirus (HPV): Secondary | ICD-10-CM | POA: Insufficient documentation

## 2015-05-03 DIAGNOSIS — R0789 Other chest pain: Secondary | ICD-10-CM | POA: Insufficient documentation

## 2015-05-03 DIAGNOSIS — L298 Other pruritus: Secondary | ICD-10-CM

## 2015-05-03 DIAGNOSIS — I10 Essential (primary) hypertension: Secondary | ICD-10-CM | POA: Diagnosis not present

## 2015-05-03 DIAGNOSIS — Z113 Encounter for screening for infections with a predominantly sexual mode of transmission: Secondary | ICD-10-CM | POA: Insufficient documentation

## 2015-05-03 DIAGNOSIS — M25559 Pain in unspecified hip: Secondary | ICD-10-CM | POA: Insufficient documentation

## 2015-05-03 DIAGNOSIS — M25551 Pain in right hip: Secondary | ICD-10-CM

## 2015-05-03 DIAGNOSIS — Z0000 Encounter for general adult medical examination without abnormal findings: Secondary | ICD-10-CM

## 2015-05-03 HISTORY — DX: Other chest pain: R07.89

## 2015-05-03 LAB — POCT WET PREP (WET MOUNT): CLUE CELLS WET PREP WHIFF POC: NEGATIVE

## 2015-05-03 LAB — POCT GLYCOSYLATED HEMOGLOBIN (HGB A1C): HEMOGLOBIN A1C: 5.2

## 2015-05-03 NOTE — Progress Notes (Signed)
59 y.o. year old female presents for well woman/preventative visit and annual GYN examination.  Acute Concerns: Chest pain: - Reports R sided chest pain starting several months ago - feels like something heavy on chest or tightening - Intermittent, lasts for few minutes - at least once daily, up to TID - can occur at rest or with movement - hasn't noticed anything that makes it worse or better - Radiation into R arm - Not associated with SOB, N/V, diaphoresis, anxiety - previously reporting R breast pain in 11/15, but reports this is different  Diet: feels like she eats more than she used to - "not good" - trying to take better care of herself now that she is no longer taking care of foster child  Exercise: nothing - trying to get back into it soon though - going to [facility] today to sign up for water aerobics - walking down stairs for exercise  Sexual/Birth History: G3P3, married, sexually active  Birth Control: postmenopausal  POA/Living Will: no, but wants to fill it out soon, finds it 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 Marital Status: Divorced    Spouse Name: N/A  . Number of Children: N/A  . Years of Education: N/A   Occupational History  . 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 Smoking status: Never Smoker   . Smokeless tobacco: Never Used  . Alcohol Use: No  . Drug Use: No  . Sexual Activity: Not on file   Other Topics Concern  . None   Social History Narrative    Immunization:  Tdap/TD: 09/2011  Influenza: 08/2014  Pneumococcal: n/a  Herpes Zoster: n/a  Cancer Screening:  Pap Smear: 09/2011 - normal, but HPV testing not done - will repeat today  Mammogram: 04/21/15 - normal  Colonoscopy: 09/22/2014 - sessile polyp - repeat in 5 years  Dexa: n/a  Physical Exam: Filed  Vitals:   05/03/15 1505  BP: 112/72  Pulse: 79  Temp: 98.6 F (37 C)   GEN: WDWN, NAD, cooperative HEENT: NCAT, MMM, PERRL, EOMI, anicteric sclera, OP clear CARDIAC: RRR, no m/r/g, 2+ DP pulses RESP: CTAB, no w/r/c. Normal WOB  ABD: Soft, NTND, +BS GU/GYN:Exam performed in the presence of a chaperone. GYN:  External genitalia within normal limits.  Vaginal mucosa pink, moist, normal rugae.  Nonfriable cervix without lesions, no discharge or bleeding noted on speculum exam.  Bimanual exam revealed normal, nongravid uterus.  No cervical motion tenderness. No adnexal masses bilaterally.   EXT: WWP ,no edema SKIN: No rashes NEURO: AAOx3, gait normal, speech normal MSK: No TTP along chest wall  ASSESSMENT & PLAN: 59 y.o. female presents for annual well woman/preventative exam and GYN exam. Please see problem specific assessment and plan.    Virginia Crews, MD, MPH PGY-2,  [facility] 05/03/2015 3:19 PM

## 2015-05-03 NOTE — Assessment & Plan Note (Signed)
Status post left hip replacement in 2014. Now reporting right hip pain Referral to [facility] for evaluation

## 2015-05-03 NOTE — Assessment & Plan Note (Signed)
Counseled on diet and exercise and importance of weight loss Screening A1c today

## 2015-05-03 NOTE — Assessment & Plan Note (Signed)
Repeat lipid panel today. 

## 2015-05-03 NOTE — Assessment & Plan Note (Signed)
Well-controlled Continue HCTZ 25 mg daily and metoprolol 50 mg twice a day BMET today Follow-up in 6 months

## 2015-05-03 NOTE — Assessment & Plan Note (Signed)
Nonexertional, but not consistent with GERD, costochondritis EKG normal sinus rhythm No active chest pain currently Referral to cardiology for possible stress test

## 2015-05-03 NOTE — Patient Instructions (Addendum)
Nice to see you again today. Someone will call you soon about scheduling an appointment with the cardiologist on the orthopedist. Someone will call you or send you a letter with the results of your lab tests from today when they're available.  Come back to see me in about 6 months to follow-up on your high blood pressure or sooner as problems arise.  Take care, Dr. Jacinto Reap

## 2015-05-03 NOTE — Assessment & Plan Note (Signed)
Wet prep collected but no discharge noted on vaginal exam

## 2015-05-03 NOTE — Assessment & Plan Note (Signed)
Asymptomatic We'll recheck TSH, free T4, free T3 today

## 2015-05-03 NOTE — Assessment & Plan Note (Signed)
Up-to-date on mammogram, colonoscopy, vaccinations Pap smear with HPV testing collected today STI testing completed today

## 2015-05-04 ENCOUNTER — Telehealth: Payer: Self-pay | Admitting: Family Medicine

## 2015-05-04 LAB — BASIC METABOLIC PANEL
BUN: 10 mg/dL (ref 6–23)
CO2: 26 mEq/L (ref 19–32)
CREATININE: 0.75 mg/dL (ref 0.50–1.10)
Calcium: 10.6 mg/dL — ABNORMAL HIGH (ref 8.4–10.5)
Chloride: 105 mEq/L (ref 96–112)
Glucose, Bld: 86 mg/dL (ref 70–99)
POTASSIUM: 3.5 meq/L (ref 3.5–5.3)
Sodium: 140 mEq/L (ref 135–145)

## 2015-05-04 LAB — LIPID PANEL
Cholesterol: 214 mg/dL — ABNORMAL HIGH (ref 0–200)
HDL: 58 mg/dL (ref >=46)
LDL CALC: 129 mg/dL — AB (ref 0–99)
Total CHOL/HDL Ratio: 3.7 Ratio
Triglycerides: 134 mg/dL (ref <150)
VLDL: 27 mg/dL (ref 0–40)

## 2015-05-04 LAB — HIV ANTIBODY (ROUTINE TESTING W REFLEX): HIV: NONREACTIVE

## 2015-05-04 LAB — TSH: TSH: 0.341 u[IU]/mL — ABNORMAL LOW (ref 0.350–4.500)

## 2015-05-04 LAB — RPR

## 2015-05-04 LAB — T3, FREE: T3 FREE: 2.7 pg/mL (ref 2.3–4.2)

## 2015-05-04 LAB — CYTOLOGY - PAP

## 2015-05-04 LAB — GC/CHLAMYDIA PROBE AMP (~~LOC~~) NOT AT ARMC
Chlamydia: NEGATIVE
NEISSERIA GONORRHEA: NEGATIVE

## 2015-05-04 LAB — T4, FREE: FREE T4: 0.84 ng/dL (ref 0.80–1.80)

## 2015-05-04 NOTE — Telephone Encounter (Signed)
Called patient to discuss lab results.  No answer on mobile number, LVM asking patient to call clinic back.  Called home number and left another number.  Lab results are as follows:  A1c 5.2 - no diabetes  HIV, RPR, Wet prep negative  Pap smear and GC/CT pending  Electrolytes and kidney function - normal  TSH remains low, but T4 and T3 normal - we will continue to monitor annually  Cholesterol is elevated - ASCVD 10 yr risk of 5%.  Appears that patient has taken statin in the past, but not currently.  Will discuss option for low intensity statin with patient.  Please ask patient for best number to reach her at if she calls back.  Virginia Crews, MD, MPH PGY-2,  [facility] 05/04/2015 3:28 PM

## 2015-05-06 ENCOUNTER — Encounter: Payer: Self-pay | Admitting: Family Medicine

## 2015-05-06 NOTE — Telephone Encounter (Signed)
Attempted to call again. No answer, left VM without identifying information.  Pap neg, HPV negative.  GC/CT negative.  Patient agreed to having letter sent with results during last office visit.  Will send letter.  Virginia Crews, MD, MPH PGY-2,  [facility] 05/06/2015 1:24 PM

## 2015-05-07 NOTE — Telephone Encounter (Signed)
Patient returned call from PCP.  Patient verified DOB and full name.  Results reviewed with patient per Dr. Brita Romp.  See notes below.  Patient stated if PCP would like to speak with today, please call [phone] until 5 PM today.  Derl Barrow, RN

## 2015-05-13 DIAGNOSIS — M7061 Trochanteric bursitis, right hip: Secondary | ICD-10-CM | POA: Diagnosis not present

## 2015-05-20 ENCOUNTER — Telehealth: Payer: Self-pay | Admitting: Family Medicine

## 2015-05-20 NOTE — Telephone Encounter (Signed)
Spoke with patient and informed her of message from MD, appointment scheduled for 8/5.

## 2015-05-20 NOTE — Telephone Encounter (Signed)
I saw patient in clinic 7/18 for R sided chest pain that was nonexertional and seemed non-cardiac in nature.  Referral placed to cardiology for possible stress test.  Patient should be seen in clinic if her chest pain has changed in character or is worsening.  If she is having substernal chest pain that is worse with exertion and associated with shortness of breath, she should go to the ED.  Attempted to call patient on multiple numbers with no answer.  Left VM without patient information asking her to call us back at the clinic to discuss.  Nursing staff, if patient calls back, please instruct her about the above.  Virginia Crews, MD, MPH PGY-2,  [facility] 05/20/2015 3:34 PM

## 2015-05-20 NOTE — Telephone Encounter (Signed)
Pt called because she use a patch for her chest pains. She was using a nitro glycerin patch about 3 years ago and was wondering if she can get this again. She has an appointment with a cardiologist  In September but it is worried now. jw

## 2015-05-21 ENCOUNTER — Ambulatory Visit (INDEPENDENT_AMBULATORY_CARE_PROVIDER_SITE_OTHER): Payer: Commercial Managed Care - HMO | Admitting: Family Medicine

## 2015-05-21 ENCOUNTER — Encounter: Payer: Self-pay | Admitting: Family Medicine

## 2015-05-21 VITALS — BP 123/67 | HR 75 | Temp 98.0°F | Wt 221.2 lb

## 2015-05-21 DIAGNOSIS — R0789 Other chest pain: Secondary | ICD-10-CM | POA: Diagnosis not present

## 2015-05-21 DIAGNOSIS — M6788 Other specified disorders of synovium and tendon, other site: Secondary | ICD-10-CM | POA: Insufficient documentation

## 2015-05-21 NOTE — Progress Notes (Signed)
Subjective: [name] is a 59 y.o. female with a history of HTN, obesity, and subclinical hyperthyroidism presenting for recent chest pain.  [name] reports intermittent (10-30 sec) right-sided chest pain for the past several months described as mild-moderate "heaviness/pressure" radiating sometimes into right arm. It is not exertional, positional or pleuritic. Not associated with dyspnea, diaphoresis, dizziness/syncope, palpitations, vision changes, nausea, vomiting, heartburn, or diarrhea. Most recent episode occurred while laying in bed thinking of a stressful situation that provoked some anxiety.  This was discussed at a recent visit to PCP (7/18) which prompted cardiology referral. She has seen Dr. Doylene Canard in the past and reports a procedure performed at short stay in the hospital in 2007 that made her chest feel hot but has not seen a cardiologist since. Recent evaluation includes:  ECG w/NSR and borderline LVH. No significant ST-T changes. Hb A1c - 5.2% Total:HDL Chol - 3.7 TSH - 0.341 free T4 - 0.84 free T3 - 2.7 BMP wnl  - Chaplain at [employer] Lifetime non-smoker, no EtOH or illicit drugs.  - No FH of CVD  Objective: BP 123/67 mmHg  Pulse 75  Temp(Src) 98 F (36.7 C) (Oral)  Wt 221 lb 3.2 oz (100.336 kg) Gen: Well-appearing, overweight, 59 y.o. female in no distress CV: Regular rate, no murmur; radial, DP and PT pulses 2+ bilaterally; no LE edema, no JVD, cap refill < 2 sec. Pulm: Non-labored breathing ambient air; CTAB, no wheezes or crackles  Assessment/Plan: [name] is a 59 y.o. female here for chest pain.  See problem list for plan.

## 2015-05-21 NOTE — Assessment & Plan Note (Signed)
Non-anginal right sided chest pain in 59yo AA female w/risk factors. Based on stable nature of pain, I think referral for risk stratification is warranted but not urgent. Reassured pt and reviewed potential causes for concern/return to clinic.

## 2015-05-21 NOTE — Patient Instructions (Signed)
Thank you for coming in today!  - I think your chest pain is concerning enough to be evaluated by a cardiologist but not urgently concerning where we need to do anything today.  - Make sure you make your appointment with Dr. Gwenlyn Found on Sept 6.   Our clinic's number is [phone]. Feel free to call any time with questions or concerns. We will answer any questions after hours with our 24-hour emergency line at that number as well.   - Dr. Bonner Puna

## 2015-06-03 ENCOUNTER — Other Ambulatory Visit: Payer: Self-pay | Admitting: *Deleted

## 2015-06-03 MED ORDER — HYDROCHLOROTHIAZIDE 25 MG PO TABS: 25.0000 mg | ORAL_TABLET | Freq: Every day | ORAL | Status: DC

## 2015-06-03 MED ORDER — METOPROLOL TARTRATE 50 MG PO TABS: 50.0000 mg | ORAL_TABLET | Freq: Two times a day (BID) | ORAL | Status: DC

## 2015-06-07 ENCOUNTER — Other Ambulatory Visit: Payer: Self-pay | Admitting: *Deleted

## 2015-06-07 NOTE — Telephone Encounter (Signed)
Need to speak with nurse regarding personal problem. Call [phone]

## 2015-06-08 MED ORDER — HYDROCHLOROTHIAZIDE 25 MG PO TABS: 25.0000 mg | ORAL_TABLET | Freq: Every day | ORAL | Status: DC

## 2015-06-08 MED ORDER — METOPROLOL TARTRATE 50 MG PO TABS: 50.0000 mg | ORAL_TABLET | Freq: Two times a day (BID) | ORAL | Status: DC

## 2015-06-22 ENCOUNTER — Encounter: Payer: Self-pay | Admitting: Cardiovascular Disease

## 2015-06-22 ENCOUNTER — Ambulatory Visit (INDEPENDENT_AMBULATORY_CARE_PROVIDER_SITE_OTHER): Payer: Commercial Managed Care - HMO | Admitting: Cardiovascular Disease

## 2015-06-22 VITALS — BP 138/76 | HR 72 | Ht 62.0 in | Wt 224.0 lb

## 2015-06-22 DIAGNOSIS — E785 Hyperlipidemia, unspecified: Secondary | ICD-10-CM

## 2015-06-22 DIAGNOSIS — I10 Essential (primary) hypertension: Secondary | ICD-10-CM

## 2015-06-22 DIAGNOSIS — R079 Chest pain, unspecified: Secondary | ICD-10-CM

## 2015-06-22 NOTE — Assessment & Plan Note (Signed)
[name] has had 3 month history of chest pain occurring several times a month lasting for seconds at a time with random radiation to the right upper extremity. She has minimal cardiac risk factors. Her pain does not sound cardiac. I'm going to obtain a routine GXT.

## 2015-06-22 NOTE — Assessment & Plan Note (Signed)
History of mild hyperlipidemia with recent lipid profile performed 05/03/15 revealing total social 214, LDL 129 HDL of 58. She is currently not on a statin drug.

## 2015-06-22 NOTE — Progress Notes (Signed)
06/22/2015 [name]   06/16/1956  [id number]  Primary Physician Lavon Paganini, MD Primary Cardiologist: Lorretta Harp MD Renae Gloss   HPI:  Mrs. Sarwar is a 59 year old severely overweight married African-American female mother of 3 children, and mother of one grandchild who works as a Clinical biochemist at [employer]. She was referred by her primary care physician at [facility] family practice for evaluation of chest pain. With factors include treated hypertension. She has mild untreated hyperlipidemia. She has never smoked. There is no family history. She has never had a heart attack or stroke. She relates the onset of chest pain approximately 3 months ago occurring several times a month lasting for seconds at a time without associated symptoms other than infrequent right upper extremity radiation.   Current Outpatient Prescriptions  Medication Sig Dispense Refill  . albuterol (PROVENTIL HFA;VENTOLIN HFA) 108 (90 BASE) MCG/ACT inhaler Inhale 2 puffs into the lungs every 6 (six) hours as needed for wheezing or shortness of breath. 3.7 g 5  . aspirin EC 325 MG tablet Take 1 tablet (325 mg total) by mouth 2 (two) times daily. (Patient taking differently: Take 325 mg by mouth daily. ) 30 tablet 0  . clotrimazole (LOTRIMIN) 1 % cream Apply 1 application topically 2 (two) times daily. 30 g 1  . fluticasone (FLONASE) 50 MCG/ACT nasal spray Place 1 spray into both nostrils 2 (two) times daily. (Patient taking differently: Place 1 spray into both nostrils as needed. ) 16 g 6  . hydrochlorothiazide (HYDRODIURIL) 25 MG tablet Take 1 tablet (25 mg total) by mouth daily. 30 tablet 6  . ipratropium (ATROVENT) 0.06 % nasal spray Place 2 sprays into both nostrils 4 (four) times daily. (Patient taking differently: Place 2 sprays into both nostrils as needed. ) 15 mL 12  . loratadine (EQ LORATADINE) 10 MG tablet Take 1 tablet (10 mg total) by mouth daily. 30 tablet 11  . metoprolol (LOPRESSOR)  50 MG tablet Take 1 tablet (50 mg total) by mouth 2 (two) times daily. 60 tablet 6  . naproxen sodium (ANAPROX) 220 MG tablet Take 220 mg by mouth 2 (two) times daily with a meal.    . PRESCRIPTION MEDICATION Cream for skin, unknown name to pt.    . traZODone (DESYREL) 50 MG tablet Take 0.5-1 tablets (25-50 mg total) by mouth at bedtime as needed for sleep. 30 tablet 3   No current facility-administered medications for this visit.    Allergies  Allergen Reactions  . Hydrocodone     Mood disturbance  . Simvastatin     REACTION: muscle aches, GI complaints    Social History   Social History  . Marital Status: Divorced    Spouse Name: N/A  . Number of Children: N/A  . Years of Education: N/A   Occupational History  . 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 Smoking status: Never Smoker   . Smokeless tobacco: Never Used  . Alcohol Use: No  . Drug Use: No  . Sexual Activity: Not on file   Other Topics Concern  . Not on file   Social History Narrative     Review of Systems: General: negative for chills, fever, night sweats or weight changes.  Cardiovascular: negative for chest pain, dyspnea on exertion, edema, orthopnea, palpitations, paroxysmal nocturnal dyspnea or shortness of breath Dermatological: negative for rash Respiratory: negative for  cough or wheezing Urologic: negative for hematuria Abdominal: negative for nausea, vomiting, diarrhea, bright red blood per rectum, melena, or hematemesis Neurologic: negative for visual changes, syncope, or dizziness All other systems reviewed and are otherwise negative except as noted above.    Blood pressure 138/76, pulse 72, height 5\' 2"  (1.575 m), weight 224 lb (101.606 kg).  General appearance: alert and no distress Neck: no adenopathy, no carotid bruit, no JVD, supple, symmetrical, trachea midline and thyroid not enlarged, symmetric, no  tenderness/mass/nodules Lungs: clear to auscultation bilaterally Heart: regular rate and rhythm, S1, S2 normal, no murmur, click, rub or gallop Extremities: extremities normal, atraumatic, no cyanosis or edema Pulses: 2+ and symmetric Skin: Skin color, texture, turgor normal. No rashes or lesions Neurologic: Grossly normal  EKG not performed today  ASSESSMENT AND PLAN:   Hyperlipidemia History of mild hyperlipidemia with recent lipid profile performed 05/03/15 revealing total social 214, LDL 129 HDL of 58. She is currently not on a statin drug.  HYPERTENSION, BENIGN ESSENTIAL History of hypertension with blood pressure measured at 138/76. She is on hydrochlorothiazide and metoprolol. Continue current meds at current dosing  Chest pain Ms Willhoite has had 3 month history of chest pain occurring several times a month lasting for seconds at a time with random radiation to the right upper extremity. She has minimal cardiac risk factors. Her pain does not sound cardiac. I'm going to obtain a routine GXT.      Lorretta Harp MD FACP,FACC,FAHA, [facility] 06/22/2015 3:26 PM

## 2015-06-22 NOTE — Patient Instructions (Signed)
  We will see you back in follow up as needed.   Dr Gwenlyn Found has ordered: 1. Your physician has requested that you have an exercise tolerance test. For further information please visit [email or web address]. Please also follow instruction sheet, as given.

## 2015-06-22 NOTE — Assessment & Plan Note (Signed)
History of hypertension with blood pressure measured at 138/76. She is on hydrochlorothiazide and metoprolol. Continue current meds at current dosing

## 2015-06-24 DIAGNOSIS — M7061 Trochanteric bursitis, right hip: Secondary | ICD-10-CM | POA: Diagnosis not present

## 2015-07-13 ENCOUNTER — Telehealth (HOSPITAL_COMMUNITY): Payer: Self-pay

## 2015-07-13 NOTE — Telephone Encounter (Signed)
Encounter complete. 

## 2015-07-14 NOTE — Telephone Encounter (Signed)
Encounter complete. 

## 2015-07-15 ENCOUNTER — Ambulatory Visit (HOSPITAL_COMMUNITY)
Admission: RE | Admit: 2015-07-15 | Discharge: 2015-07-15 | Disposition: A | Discharge status: Home / Self Care | Payer: Commercial Managed Care - HMO | Source: Ambulatory Visit | Attending: Cardiovascular Disease | Admitting: Cardiovascular Disease

## 2015-07-15 ENCOUNTER — Ambulatory Visit (INDEPENDENT_AMBULATORY_CARE_PROVIDER_SITE_OTHER): Payer: Commercial Managed Care - HMO | Admitting: Family Medicine

## 2015-07-15 ENCOUNTER — Encounter: Payer: Self-pay | Admitting: Family Medicine

## 2015-07-15 ENCOUNTER — Encounter (HOSPITAL_COMMUNITY): Payer: Self-pay | Admitting: *Deleted

## 2015-07-15 VITALS — BP 139/87 | HR 92 | Temp 98.4°F | Ht 62.0 in | Wt 220.5 lb

## 2015-07-15 DIAGNOSIS — R079 Chest pain, unspecified: Secondary | ICD-10-CM | POA: Diagnosis not present

## 2015-07-15 DIAGNOSIS — R350 Frequency of micturition: Secondary | ICD-10-CM

## 2015-07-15 DIAGNOSIS — Z23 Encounter for immunization: Secondary | ICD-10-CM

## 2015-07-15 LAB — EXERCISE TOLERANCE TEST
CHL CUP STRESS STAGE 1 DBP: 93 mmHg
CHL CUP STRESS STAGE 1 GRADE: 0 %
CHL CUP STRESS STAGE 1 SBP: 117 mmHg
CHL CUP STRESS STAGE 1 SPEED: 0 mph
CHL CUP STRESS STAGE 2 GRADE: 0 %
CHL CUP STRESS STAGE 2 HR: 105 {beats}/min
CHL CUP STRESS STAGE 3 GRADE: 0 %
CHL CUP STRESS STAGE 3 HR: 107 {beats}/min
CHL CUP STRESS STAGE 4 HR: 151 {beats}/min
CHL CUP STRESS STAGE 4 SBP: 113 mmHg
CHL CUP STRESS STAGE 4 SPEED: 1.7 mph
CHL CUP STRESS STAGE 5 GRADE: 12 %
CHL CUP STRESS STAGE 5 SPEED: 2.5 mph
CHL CUP STRESS STAGE 6 GRADE: 0 %
CHL CUP STRESS STAGE 6 HR: 144 {beats}/min
CHL CUP STRESS STAGE 6 SBP: 147 mmHg
CHL CUP STRESS STAGE 7 DBP: 80 mmHg
CHL CUP STRESS STAGE 7 GRADE: 0 %
CHL CUP STRESS STAGE 7 HR: 97 {beats}/min
CHL CUP STRESS STAGE 7 SBP: 114 mmHg
CSEPPBP: 111 mmHg
CSEPPMHR: 106 %
Estimated workload: 7 METS
Exercise duration (min): 5 min
MPHR: 162 {beats}/min
Peak HR: 173 {beats}/min
Percent HR: 106 %
RPE: 15
Rest HR: 93 {beats}/min
Stage 1 HR: 101 {beats}/min
Stage 2 Speed: 1 mph
Stage 3 Speed: 1 mph
Stage 4 DBP: 54 mmHg
Stage 4 Grade: 10 %
Stage 5 DBP: 61 mmHg
Stage 5 HR: 173 {beats}/min
Stage 5 SBP: 111 mmHg
Stage 6 DBP: 97 mmHg
Stage 6 Speed: 0 mph
Stage 7 Speed: 0 mph

## 2015-07-15 LAB — POCT URINALYSIS DIPSTICK
BILIRUBIN UA: NEGATIVE
Blood, UA: NEGATIVE
GLUCOSE UA: NEGATIVE
Leukocytes, UA: NEGATIVE
Nitrite, UA: NEGATIVE
Protein, UA: NEGATIVE
SPEC GRAV UA: 1.02
UROBILINOGEN UA: 0.2
pH, UA: 6.5

## 2015-07-15 MED ORDER — LORATADINE 10 MG PO TABS: 10.0000 mg | ORAL_TABLET | Freq: Every day | ORAL | Status: DC

## 2015-07-15 MED ORDER — CEPHALEXIN 500 MG PO CAPS: 500.0000 mg | ORAL_CAPSULE | Freq: Two times a day (BID) | ORAL | Status: DC

## 2015-07-15 NOTE — Progress Notes (Unsigned)
Patient ID: [name], female   DOB: October 01, 1956, 59 y.o.   MRN: [id number] Showed patient's test to Dr. Sallyanne Kuster; he stated it was abnormal but she was ok to d/c home.

## 2015-07-15 NOTE — Patient Instructions (Signed)
Thank you for coming to see me today. It was a pleasure. Today we talked about:   Frequent urination: you don't appear to have a urinary tract infection, however, with your symptoms, I will treat you and get a urine culture. If negative, someone should call you to let you know to stop antibiotics. You also mentioned vaginal discharge but you were not able to get an exam today. If this persists, please return for follow-up of this issue.    If you have any questions or concerns, please do not hesitate to call the office at [phone].  Sincerely,  Cordelia Poche, MD  Cephalexin tablets or capsules What is this medicine? CEPHALEXIN (sef a LEX in) is a cephalosporin antibiotic. It is used to treat certain kinds of bacterial infections It will not work for colds, flu, or other viral infections. This medicine may be used for other purposes; ask your health care provider or pharmacist if you have questions. COMMON BRAND NAME(S): Biocef, Keflex, Keftab What should I tell my health care provider before I take this medicine? They need to know if you have any of these conditions: -kidney disease -stomach or intestine problems, especially colitis -an unusual or allergic reaction to cephalexin, other cephalosporins, penicillins, other antibiotics, medicines, foods, dyes or preservatives -pregnant or trying to get pregnant -breast-feeding How should I use this medicine? Take this medicine by mouth with a full glass of water. Follow the directions on the prescription label. This medicine can be taken with or without food. Take your medicine at regular intervals. Do not take your medicine more often than directed. Take all of your medicine as directed even if you think you are better. Do not skip doses or stop your medicine early. Talk to your pediatrician regarding the use of this medicine in children. While this drug may be prescribed for selected conditions, precautions do apply. Overdosage: If you  think you have taken too much of this medicine contact a poison control center or emergency room at once. NOTE: This medicine is only for you. Do not share this medicine with others. What if I miss a dose? If you miss a dose, take it as soon as you can. If it is almost time for your next dose, take only that dose. Do not take double or extra doses. There should be at least 4 to 6 hours between doses. What may interact with this medicine? -probenecid -some other antibiotics This list may not describe all possible interactions. Give your health care provider a list of all the medicines, herbs, non-prescription drugs, or dietary supplements you use. Also tell them if you smoke, drink alcohol, or use illegal drugs. Some items may interact with your medicine. What should I watch for while using this medicine? Tell your doctor or health care professional if your symptoms do not begin to improve in a few days. Do not treat diarrhea with over the counter products. Contact your doctor if you have diarrhea that lasts more than 2 days or if it is severe and watery. If you have diabetes, you may get a false-positive result for sugar in your urine. Check with your doctor or health care professional. What side effects may I notice from receiving this medicine? Side effects that you should report to your doctor or health care professional as soon as possible: -allergic reactions like skin rash, itching or hives, swelling of the face, lips, or tongue -breathing problems -pain or trouble passing urine -redness, blistering, peeling or loosening of the  skin, including inside the mouth -severe or watery diarrhea -unusually weak or tired -yellowing of the eyes, skin Side effects that usually do not require medical attention (report to your doctor or health care professional if they continue or are bothersome): -gas or heartburn -genital or anal irritation -headache -joint or muscle pain -nausea, vomiting This  list may not describe all possible side effects. Call your doctor for medical advice about side effects. You may report side effects to FDA at 1-800-FDA-1088. Where should I keep my medicine? Keep out of the reach of children. Store at room temperature between 59 and 86 degrees F (15 and 30 degrees C). Throw away any unused medicine after the expiration date. NOTE: This sheet is a summary. It may not cover all possible information. If you have questions about this medicine, talk to your doctor, pharmacist, or health care provider.  2015, Elsevier/Gold Standard. (2008-01-06 17:09:13)

## 2015-07-15 NOTE — Progress Notes (Signed)
    Subjective   [name] is a 59 y.o. female that presents for a same day visit  1. Frequent urination: Symptoms started about one week ago and has been worsening. She has associated pelvic pressure, and urgency. She has associated vaginal odor with discharge. No associated fevers, nausea or vomiting.  ROS Per HPI  Social History  Substance Use Topics  . Smoking status: Never Smoker   . Smokeless tobacco: Never Used  . Alcohol Use: No    Allergies  Allergen Reactions  . Hydrocodone     Mood disturbance  . Simvastatin     REACTION: muscle aches, GI complaints    Objective   BP 139/87 mmHg  Pulse 92  Temp(Src) 98.4 F (36.9 C) (Oral)  Ht 5\' 2"  (1.575 m)  Wt 220 lb 8 oz (100.018 kg)  BMI 40.32 kg/m2  General: Well appearing, no distress Gastrointestinal: Soft, non-tender, normal bowel sounds. No suprapubic tenderness Genital: declined by patient secondary to time for procedure  Assessment and Plan   Meds ordered this encounter  Medications  . cephALEXin (KEFLEX) 500 MG capsule    Sig: Take 1 capsule (500 mg total) by mouth 2 (two) times daily.    Dispense:  14 capsule    Refill:  0  . loratadine (EQ LORATADINE) 10 MG tablet    Sig: Take 1 tablet (10 mg total) by mouth daily.    Dispense:  30 tablet    Refill:  11    Urinary frequency: Symptoms including pelvic pressure and urgency. Urine dipstick not consistent with infection. Will treat empirically and follow-up culture. Possibly related to vaginal symptoms   Keflex 500mg  BID x7 days  Urine culture  Follow-up for vaginal exam  Return precautions

## 2015-07-16 DIAGNOSIS — Z23 Encounter for immunization: Secondary | ICD-10-CM | POA: Diagnosis not present

## 2015-07-16 LAB — URINE CULTURE

## 2015-07-20 ENCOUNTER — Ambulatory Visit (INDEPENDENT_AMBULATORY_CARE_PROVIDER_SITE_OTHER): Payer: Commercial Managed Care - HMO | Admitting: Cardiovascular Disease

## 2015-07-20 ENCOUNTER — Encounter: Payer: Self-pay | Admitting: Cardiovascular Disease

## 2015-07-20 VITALS — BP 124/90 | HR 99 | Ht 60.0 in | Wt 222.0 lb

## 2015-07-20 DIAGNOSIS — R079 Chest pain, unspecified: Secondary | ICD-10-CM

## 2015-07-20 NOTE — Assessment & Plan Note (Signed)
[name] returns for follow-up of her routine GXT recently performed 07/15/15 which was positive with horizontal ST segment depression in leads II, III, and F and V5 and V6 beginning at 4 minutes of distress. Her symptoms are some somewhat atypical and she does have positive risk factors. We talked about performing an exercise Myoview stress test as a more sensitive indicator of ischemia versus a cardiac catheterization. We've decided to perform the stress test initially to risk stratify her and rule out an ischemic etiology.

## 2015-07-20 NOTE — Patient Instructions (Signed)
Medication Instructions:  Your physician recommends that you continue on your current medications as directed. Please refer to the Current Medication list given to you today.   Labwork: none  Testing/Procedures: Your physician has requested that you have en exercise stress myoview. For further information please visit [email or web address]. Please follow instruction sheet, as given.   Follow-Up: Your physician recommends that you schedule a follow-up appointment in: 1-2 weeks after Myoview with Dr. Gwenlyn Found.   Any Other Special Instructions Will Be Listed Below (If Applicable).

## 2015-07-20 NOTE — Progress Notes (Signed)
[name] returns for follow-up of her routine GXT recently performed 07/15/15 which was positive with horizontal ST segment depression in leads II, III, and F and V5 and V6 beginning at 4 minutes of distress. Her symptoms are some somewhat atypical and she does have positive risk factors. We talked about performing an exercise Myoview stress test as a more sensitive indicator of ischemia versus a cardiac catheterization. We've decided to perform the stress test initially to risk stratify her and rule out an ischemic etiology.

## 2015-07-28 ENCOUNTER — Telehealth (HOSPITAL_COMMUNITY): Payer: Self-pay

## 2015-07-28 NOTE — Telephone Encounter (Signed)
Encounter complete. 

## 2015-07-30 ENCOUNTER — Ambulatory Visit (HOSPITAL_COMMUNITY)
Admission: RE | Admit: 2015-07-30 | Discharge: 2015-07-30 | Disposition: A | Discharge status: Home / Self Care | Payer: Commercial Managed Care - HMO | Source: Ambulatory Visit | Attending: Cardiology | Admitting: Cardiology

## 2015-07-30 DIAGNOSIS — R0602 Shortness of breath: Secondary | ICD-10-CM | POA: Diagnosis not present

## 2015-07-30 DIAGNOSIS — R079 Chest pain, unspecified: Secondary | ICD-10-CM

## 2015-07-30 DIAGNOSIS — R5383 Other fatigue: Secondary | ICD-10-CM | POA: Insufficient documentation

## 2015-07-30 DIAGNOSIS — I10 Essential (primary) hypertension: Secondary | ICD-10-CM | POA: Insufficient documentation

## 2015-07-30 DIAGNOSIS — R42 Dizziness and giddiness: Secondary | ICD-10-CM | POA: Diagnosis not present

## 2015-07-30 LAB — MYOCARDIAL PERFUSION IMAGING
CHL CUP MPHR: 162 {beats}/min
CHL CUP NUCLEAR SDS: 3
CHL CUP NUCLEAR SRS: 0
CHL CUP NUCLEAR SSS: 3
CSEPHR: 104 %
Estimated workload: 7 METS
Exercise duration (min): 6 min
Exercise duration (sec): 0 s
LV sys vol: 40 mL
LVDIAVOL: 92 mL
NUC STRESS TID: 1.08
Peak HR: 169 {beats}/min
RPE: 15
Rest HR: 73 {beats}/min

## 2015-07-30 MED ORDER — TECHNETIUM TC 99M SESTAMIBI GENERIC - CARDIOLITE
30.9000 | Freq: Once | INTRAVENOUS | Status: AC | PRN
  Administered 2015-07-30: 30.9 via INTRAVENOUS

## 2015-07-30 MED ORDER — TECHNETIUM TC 99M SESTAMIBI GENERIC - CARDIOLITE
10.9000 | Freq: Once | INTRAVENOUS | Status: AC | PRN
  Administered 2015-07-30: 10.9 via INTRAVENOUS

## 2015-08-16 ENCOUNTER — Ambulatory Visit (INDEPENDENT_AMBULATORY_CARE_PROVIDER_SITE_OTHER): Payer: Commercial Managed Care - HMO | Admitting: Family Medicine

## 2015-08-16 ENCOUNTER — Other Ambulatory Visit (HOSPITAL_COMMUNITY)
Admission: RE | Admit: 2015-08-16 | Discharge: 2015-08-16 | Disposition: A | Discharge status: Home / Self Care | Payer: Commercial Managed Care - HMO | Source: Ambulatory Visit | Attending: Family Medicine | Admitting: Family Medicine

## 2015-08-16 ENCOUNTER — Encounter: Payer: Self-pay | Admitting: Family Medicine

## 2015-08-16 VITALS — BP 138/86 | HR 69 | Temp 98.3°F | Wt 225.0 lb

## 2015-08-16 DIAGNOSIS — N819 Female genital prolapse, unspecified: Secondary | ICD-10-CM | POA: Diagnosis not present

## 2015-08-16 DIAGNOSIS — Z113 Encounter for screening for infections with a predominantly sexual mode of transmission: Secondary | ICD-10-CM | POA: Diagnosis not present

## 2015-08-16 DIAGNOSIS — N898 Other specified noninflammatory disorders of vagina: Secondary | ICD-10-CM | POA: Diagnosis not present

## 2015-08-16 DIAGNOSIS — N8111 Cystocele, midline: Secondary | ICD-10-CM

## 2015-08-16 LAB — POCT WET PREP (WET MOUNT): CLUE CELLS WET PREP WHIFF POC: NEGATIVE

## 2015-08-16 NOTE — Progress Notes (Signed)
    Subjective: CC: vaginal issues HPI: Patient is a 59 y.o. female presenting to clinic today for same day appt. Concerns today include:  1. Vaginal foreign body sensation Patient reports that her husband felt a rounded object during intercourse a few days ago.  He reported that it felt like she was wearing a female condom.  She notes that she checked this herself digitally and felt something round up there.  She reports vaginal discharge.  She endorses vaginal odors.  She denies vaginal pruritis, abnormal vaginal bleeding, dysuria, hematuria, pelvic pain, nausea, vomiting, fevers, dyspareunia.   She has been drinking cranberry juice to help with UTI she had recently.  Has history of STIs, trichomonas in 2013.  She is sexually active and occasionally use condoms.  Contraception.  No LMP recorded. Patient is postmenopausal.  Social History Reviewed: non smoker. FamHx and MedHx updated.  Please see EMR. Health Maintenance: Hep C screening needed  ROS: Per HPI  Objective: Office vital signs reviewed. BP 138/86 mmHg  Pulse 69  Temp(Src) 98.3 F (36.8 C) (Oral)  Wt 225 lb (102.059 kg)  SpO2 96%  Physical Examination:  General: Awake, alert, obese, NAD HEENT: Normal, MMM GU: atrophic appearing external vaginal tissue, cervix easily visualized, no punctate lesions, no cervical motion tenderness, moderate white discharge from cervical os, no bleeding from os, no palpable adnexal masses, +cystocele appreciated with increased pelvic pressure.  Results for orders placed or performed in visit on 08/16/15 (from the past 24 hour(s))  POCT Wet Prep [facility])     Status: Abnormal   Collection Time: 08/16/15 11:45 AM  Result Value Ref Range   Source Wet Prep POC VAG    WBC, Wet Prep HPF POC 3-8    Bacteria Wet Prep HPF POC Many (A) None, Few, Too numerous to count   Clue Cells Wet Prep HPF POC None None, Too numerous to count   Clue Cells Wet Prep Whiff POC Negative Whiff    Yeast Wet Prep  HPF POC None    Trichomonas Wet Prep HPF POC NONE    Assessment/ Plan: 59 y.o. female with  1. Pelvic prolapse. Mild.  Cystocele appreciated with increased pelvic pressure.  Suspect that this is why she has had difficulty emptying bladder.  No foreign bodies in vaginal vault. - Reassurance for now - Ambulatory referral to Gynecology, could consider pessary.  Patient does not seem amenable to surgical intervention. - Return precautions reviewed - Reading materials provided, see AVS.  2. Vaginal discharge. Wet prep with many bacteria but no evidence of yeast or BV.  Suspect normal physiologic leukorrhea. - POCT Wet Prep [facility]) - Cervicovaginal ancillary only, GC/CT sent - Follow up with PCP as needed for routine care.  Janora Norlander, DO PGY-2, [place]

## 2015-08-16 NOTE — Patient Instructions (Signed)
I think that you have weakness in your pelvic muscles.  I think that the sensation of incomplete emptying of your bladder is likely a symptom of this.  I have placed a referral to gynecology for evaluation and treatment.  Please do not hesitate to call our office with any questions.  Pelvic Organ Prolapse Pelvic organ prolapse is the stretching, bulging, or dropping of pelvic organs into an abnormal position. It happens when the muscles and tissues that surround and support pelvic structures are stretched or weak. Pelvic organ prolapse can involve:  Vagina (vaginal prolapse).  Uterus (uterine prolapse).  Bladder (cystocele).  Rectum (rectocele).  Intestines (enterocele). When organs other than the vagina are involved, they often bulge into the vagina or protrude from the vagina, depending on how severe the prolapse is. CAUSES Causes of this condition include:  Pregnancy, labor, and childbirth.  Long-lasting (chronic) cough.  Chronic constipation.  Obesity.  Past pelvic surgery.  Aging. During and after menopause, a decreased production of the hormone estrogen can weaken pelvic ligaments and muscles.  Consistently lifting more than 50 lb (23 kg).  Buildup of fluid in the abdomen due to certain diseases and other conditions. SYMPTOMS Symptoms of this condition include:  Loss of bladder control when you cough, sneeze, strain, and exercise (stress incontinence). This may be worse immediately following childbirth, and it may gradually improve over time.  Feeling pressure in your pelvis or vagina. This pressure may increase when you cough or when you are having a bowel movement.  A bulge that protrudes from the opening of your vagina or against your vaginal wall. If your uterus protrudes through the opening of your vagina and rubs against your clothing, you may also experience soreness, ulcers, infection, pain, and bleeding.  Increased effort to have a bowel movement or  urinate.  Pain in your low back.  Pain, discomfort, or disinterest in sexual intercourse.  Repeated bladder infections (urinary tract infections).  Difficulty inserting or inability to insert a tampon or applicator. In some people, this condition does not cause any symptoms. DIAGNOSIS Your health care provider may perform an internal and external vaginal and rectal exam. During the exam, you may be asked to cough and strain while you are lying down, sitting, and standing up. Your health care provider will determine if other tests are required, such as bladder function tests. TREATMENT In most cases, this condition needs to be treated only if it produces symptoms. No treatment is guaranteed to correct the prolapse or relieve the symptoms completely. Treatment may include:  Lifestyle changes, such as:  Avoiding drinking beverages that contain caffeine.  Increasing your intake of high-fiber foods. This can help to decrease constipation and straining during bowel movements.  Emptying your bladder at scheduled times (bladder training therapy). This can help to reduce or avoid urinary incontinence.  Losing weight if you are overweight or obese.  Estrogen. Estrogen may help mild prolapse by increasing the strength and tone of pelvic floor muscles.  Kegel exercises. These may help mild cases of prolapse by strengthening and tightening the muscles of the pelvic floor.  Pessary insertion. A pessary is a soft, flexible device that is placed into your vagina by your health care provider to help support the vaginal walls and keep pelvic organs in place.  Surgery. This is often the only form of treatment for severe prolapse. Different types of surgeries are available. HOME CARE INSTRUCTIONS  Wear a sanitary pad or absorbent product if you have urinary incontinence.  Avoid heavy lifting and straining with exercise and work. Do not hold your breath when you perform mild to moderate lifting and  exercise activities. Limit your activities as directed by your health care provider.  Take medicines only as directed by your health care provider.  Perform Kegel exercises as directed by your health care provider.  If you have a pessary, take care of it as directed by your health care provider. SEEK MEDICAL CARE IF:  Your symptoms interfere with your daily activities or sex life.  You need medicine to help with the discomfort.  You notice bleeding from the vagina that is not related to your period.  You have a fever.  You have pain or bleeding when you urinate.  You have bleeding when you have a bowel movement.  You lose urine when you have sex.  You have chronic constipation.  You have a pessary that falls out.  You have vaginal discharge that has a bad smell.  You have low abdominal pain or cramping that is unusual for you.   This information is not intended to replace advice given to you by your health care provider. Make sure you discuss any questions you have with your health care provider.   Document Released: 04/29/2014 Document Reviewed: 04/29/2014 Elsevier Interactive Patient Education [employer].

## 2015-08-17 LAB — CERVICOVAGINAL ANCILLARY ONLY
Chlamydia: NEGATIVE
Neisseria Gonorrhea: NEGATIVE

## 2015-08-18 ENCOUNTER — Encounter: Payer: Self-pay | Admitting: Family Medicine

## 2015-08-18 ENCOUNTER — Encounter: Payer: Self-pay | Admitting: Cardiovascular Disease

## 2015-08-18 ENCOUNTER — Ambulatory Visit (INDEPENDENT_AMBULATORY_CARE_PROVIDER_SITE_OTHER): Payer: Commercial Managed Care - HMO | Admitting: Cardiovascular Disease

## 2015-08-18 VITALS — BP 128/86 | HR 72 | Ht 60.0 in | Wt 224.0 lb

## 2015-08-18 DIAGNOSIS — I10 Essential (primary) hypertension: Secondary | ICD-10-CM

## 2015-08-18 DIAGNOSIS — R079 Chest pain, unspecified: Secondary | ICD-10-CM | POA: Diagnosis not present

## 2015-08-18 DIAGNOSIS — E785 Hyperlipidemia, unspecified: Secondary | ICD-10-CM | POA: Diagnosis not present

## 2015-08-18 NOTE — Assessment & Plan Note (Signed)
History of hypertension with blood pressure measured at 120/86. She is on hydrocodone and renal metoprolol. Continue current meds at current dosing

## 2015-08-18 NOTE — Patient Instructions (Signed)
Medication Instructions:  Your physician recommends that you continue on your current medications as directed. Please refer to the Current Medication list given to you today.   Labwork: Your physician recommends that you return for lab work in: FASTING 3 months (lipid/liver) The lab can be found on the FIRST FLOOR of out building in [address]   Testing/Procedures: none  Follow-Up: Your physician wants you to follow-up in: 6 months with Dr Gwenlyn Found.  You will receive a reminder letter in the mail two months in advance. If you don't receive a letter, please call our office to schedule the follow-up appointment.   Any Other Special Instructions Will Be Listed Below (If Applicable).     If you need a refill on your cardiac medications before your next appointment, please call your pharmacy.

## 2015-08-18 NOTE — Progress Notes (Signed)
[name] returns for follow-up for Myoview stress test performed 08/06/15 which was normal. This was done following a routine GXT that H show ST segment depression. She still has occasional atypical chest pain brought on by anxiety. She has mild hyperlipidemia although she admits to dietary indiscretion. We will recheck a lipid and liver profile in 3 months and I will see her back in 6 months for follow-up.

## 2015-08-18 NOTE — Assessment & Plan Note (Signed)
History of mild hyperlipidemia lipid profile performed 05/03/15 revealing toe partial to 14, LDL 129 HDL 59 years she is not on a statin drug and does admit to dietary indiscretion

## 2015-08-18 NOTE — Assessment & Plan Note (Signed)
History of atypical chest pain with a recent Myoview performed 08/06/15 which was normal. I reassured her that the likelihood of her pain being ischemic in nature was small.

## 2015-08-19 ENCOUNTER — Encounter: Payer: Self-pay | Admitting: Family Medicine

## 2015-08-19 ENCOUNTER — Ambulatory Visit (INDEPENDENT_AMBULATORY_CARE_PROVIDER_SITE_OTHER): Payer: Commercial Managed Care - HMO | Admitting: Family Medicine

## 2015-08-19 VITALS — BP 151/86 | HR 84 | Temp 98.4°F | Ht 62.0 in | Wt 225.4 lb

## 2015-08-19 DIAGNOSIS — J029 Acute pharyngitis, unspecified: Secondary | ICD-10-CM | POA: Diagnosis not present

## 2015-08-19 LAB — POCT RAPID STREP A (OFFICE): Rapid Strep A Screen: NEGATIVE

## 2015-08-19 NOTE — Patient Instructions (Signed)
The throat swab was negative for strep throat. This means that it is most likely a virus that is causing the sore throat and antbiotics won't help.  I would give it about a week and if you have not started to feel any better come back to the clinic to get re-tested.   Stay hydrated, drink plenty of water. Okay to use throat lozenges and ice cream/popsicles to help with comfort.  Cough and cold medicines if you develop more nasal congestion, cough.   Tylenol and ibuprofen to help with any fevers or pain.

## 2015-08-19 NOTE — Progress Notes (Signed)
   Subjective:    Patient ID: [name], female    DOB: [birth year], 59 y.o.   MRN: [id number]  HPI  Patient presents for Same Day Appointment  CC: SORE THROAT  Sore throat began 2 days ago. 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    Past medical history, surgical, family, and social history reviewed and updated in the EMR as appropriate.  Objective:  BP 151/86 mmHg  Pulse 84  Temp(Src) 98.4 F (36.9 C) (Oral)  Ht 5\' 2"  (1.575 m)  Wt 225 lb 6.4 oz (102.241 kg)  BMI 41.22 kg/m2 Vitals and nursing note reviewed  General: NAD Eyes: PERRL, EOMI, normal conjunctiva and sclera  ENTM: bilateral enlargement of tonsils with exudate CV: RRR, normal heart sounds, no murmurs, rubs or gallop  Resp: clear to auscultation bilaterally, normal effort Skin: no rashes noted Neuro: alert and oriented, no deficits  Assessment & Plan:  1. Sore throat Rapid strep negative. Suspect viral etiology at this time. Recommended symptomatic treatment only. Follow up if not improving in next week. - POCT rapid strep A

## 2015-09-01 ENCOUNTER — Encounter: Payer: Self-pay | Admitting: Gynecology

## 2015-09-01 ENCOUNTER — Ambulatory Visit (INDEPENDENT_AMBULATORY_CARE_PROVIDER_SITE_OTHER): Payer: Commercial Managed Care - HMO | Admitting: Gynecology

## 2015-09-01 VITALS — BP 124/80 | Ht 60.75 in | Wt 223.0 lb

## 2015-09-01 DIAGNOSIS — N3941 Urge incontinence: Secondary | ICD-10-CM | POA: Diagnosis not present

## 2015-09-01 DIAGNOSIS — N3281 Overactive bladder: Secondary | ICD-10-CM | POA: Diagnosis not present

## 2015-09-01 DIAGNOSIS — IMO0002 Reserved for concepts with insufficient information to code with codable children: Secondary | ICD-10-CM

## 2015-09-01 DIAGNOSIS — N8110 Cystocele, unspecified: Secondary | ICD-10-CM | POA: Diagnosis not present

## 2015-09-01 HISTORY — DX: Overactive bladder: N32.81

## 2015-09-01 MED ORDER — TOLTERODINE TARTRATE ER 2 MG PO CP24: 2.0000 mg | ORAL_CAPSULE | Freq: Every day | ORAL | Status: DC

## 2015-09-01 NOTE — Progress Notes (Signed)
    patient is a 59 year old gravida 4 para 3 Ab1 (3 normal spontaneous vaginal deliveries largest baby weighed 9 pounds) who presented to the office today as a referral from her [facility] primary care physician because of patient's complaint of pressure sensation in her vagina. Patient also states that she has urgency incontinence and she does not to the bathroom she will leak urine and for this reason she wears a pad. She also describes getting up at night to 3 times urinate. She does have hypertension and is currently on medication to include as well as diuretic. She does not have any history of glaucoma. Patient denies any past history of any abnormal Pap smears. Her Pap smears, colonoscopy and mammograms and vaccines are up-to-date.  Exam: Patient was examined in the supine as well as in the erect position. Bartholin urethra Skene was within normal limits Vagina: No lesions or discharge Cervix: No lesions or discharge Bimanual exam: Uterus anteverted normal size shape and consistency Adnexa: No palpable masses or tenderness Rectal exam not done  Anal wink intact A sterile Q-tip was placed in the urethra with lidocaine. Q-tip angle less than 30 no evidence of hypermobility of the urethra Patient was examined in the supine position at which time a second-degree cystocele was present but no evidence of uterine descensus or rectocele or enterocele.  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 The risks benefits and pros and cons of the medication were discussed. She would like to go through this route first and continue to monitor symptoms. We had also discussed the use of pessary but she would like to try the medication as well as Kegel exercises for which literature information was provided.

## 2015-09-01 NOTE — Patient Instructions (Addendum)
Tolterodine extended-release capsules  What is this medicine?  TOLTERODINE (tole TER a deen) is used to treat overactive bladder. This medicine reduces the amount of bathroom visits. It may also help to control wetting accidents.  This medicine may be used for other purposes; ask your health care provider or pharmacist if you have questions.  What should I tell my health care provider before I take this medicine?  They need to know if you have any of these conditions:  -difficulty passing urine  -glaucoma  -intestinal obstruction  -irregular heartbeat or you have a family member with irregular heartbeat  -kidney disease  -liver disease  -myasthenia gravis  -an unusual or allergic reaction to tolterodine, fesoterodine, other medicines, foods, dyes, or preservatives  -pregnant or trying to get pregnant  -breast-feeding  How should I use this medicine?  Take this medicine by mouth with a glass of water. Swallow whole, do not crush, cut, or chew. Follow the directions on the prescription label. Take your doses at regular intervals. Do not take your medicine more often than directed.  Talk to your pediatrician regarding the use of this medicine in children. Special care may be needed.  Overdosage: If you think you have taken too much of this medicine contact a poison control center or emergency room at once.  NOTE: This medicine is only for you. Do not share this medicine with others.  What if I miss a dose?  If you miss a dose, take it as soon as you can. If it is almost time for your next dose, take only that dose. Do not take double or extra doses.  What may interact with this medicine?  -clarithromycin  -cyclosporine  -erythromycin  -fluoxetine  -medicines for fungal infections, like fluconazole, itraconazole, ketoconazole or voriconazole  -medicines for memory problems like galantamine, donepezil, tacrine  -vinblastine  This list may not describe all possible interactions. Give your health care provider a list of  all the medicines, herbs, non-prescription drugs, or dietary supplements you use. Also tell them if you smoke, drink alcohol, or use illegal drugs. Some items may interact with your medicine.  What should I watch for while using this medicine?  It may take 2 or 3 months to notice the full benefit from this medicine. Your health care professional may also recommend techniques that may help improve control of your bladder and sphincter muscles. These techniques will help you need the bathroom less frequently.  You may need to limit your intake tea, coffee, caffeinated sodas, and alcohol. These drinks may make your symptoms worse. Keeping healthy bowel habits may lessen bladder symptoms. If you currently smoke, quitting smoking may help reduce irritation to the bladder muscle.  You may get drowsy or dizzy. Do not drive, use machinery, or do anything that needs mental alertness until you know how this drug affects you. Do not stand or sit up quickly, especially if you are an older patient. This reduces the risk of dizzy or fainting spells.  Your mouth may get dry. Chewing sugarless gum or sucking hard candy, and drinking plenty of water, will help.  This medicine may cause dry eyes and blurred vision. If you wear contact lenses you may feel some discomfort. Lubricating drops may help. See your eye doctor if the problem does not go away or is severe.  What side effects may I notice from receiving this medicine?  Side effects that you should report to your doctor or health care professional as soon as possible:  -  attention (report to your doctor or health care professional if they continue or are bothersome): -changes in  vision -constipation -dry eyes, mouth -headache -dizziness, drowsiness -stomach upset This list may not describe all possible side effects. Call your doctor for medical advice about side effects. You may report side effects to FDA at 1-800-FDA-1088. Where should I keep my medicine? Keep out of the reach of children. Store at room temperature between 15 and 30 degrees C (59 and 86 degrees F). Protect from light. Throw away any unused medicine after the expiration date. NOTE: This sheet is a summary. It may not cover all possible information. If you have questions about this medicine, talk to your doctor, pharmacist, or health care provider.    2016, Elsevier/Gold Standard. (2010-07-12 17:20:26) Kegel Exercises The goal of Kegel exercises is to isolate and exercise your pelvic floor muscles. These muscles act as a hammock that supports the rectum, vagina, small intestine, and uterus. As the muscles weaken, the hammock sags and these organs are displaced from their normal positions. Kegel exercises can strengthen your pelvic floor muscles and help you to improve bladder and bowel control, improve sexual response, and help reduce many problems and some discomfort during pregnancy. Kegel exercises can be done anywhere and at any time. HOW TO PERFORM KEGEL EXERCISES 1. Locate your pelvic floor muscles. To do this, squeeze (contract) the muscles that you use when you try to stop the flow of urine. You will feel a tightness in the vaginal area (women) and a tight lift in the rectal area (men and women). 2. When you begin, contract your pelvic muscles tight for 2-5 seconds, then relax them for 2-5 seconds. This is one set. Do 4-5 sets with a short pause in between. 3. Contract your pelvic muscles for 8-10 seconds, then relax them for 8-10 seconds. Do 4-5 sets. If you cannot contract your pelvic muscles for 8-10 seconds, try 5-7 seconds and work your way up to 8-10 seconds. Your goal is 4-5 sets of 10  contractions each day. Keep your stomach, buttocks, and legs relaxed during the exercises. Perform sets of both short and long contractions. Vary your positions. Perform these contractions 3-4 times per day. Perform sets while you are:   Lying in bed in the morning.  Standing at lunch.  Sitting in the late afternoon.  Lying in bed at night. You should do 40-50 contractions per day. Do not perform more Kegel exercises per day than recommended. Overexercising can cause muscle fatigue. Continue these exercises for for at least 15-20 weeks or as directed by your caregiver.   This information is not intended to replace advice given to you by your health care provider. Make sure you discuss any questions you have with your health care provider.   Document Released: 09/18/2012 Document Revised: 10/23/2014 Document Reviewed: 09/18/2012 Elsevier Interactive Patient Education [employer].

## 2015-12-27 ENCOUNTER — Encounter: Payer: Self-pay | Admitting: Family Medicine

## 2015-12-27 ENCOUNTER — Ambulatory Visit (INDEPENDENT_AMBULATORY_CARE_PROVIDER_SITE_OTHER): Payer: Commercial Managed Care - HMO | Admitting: Family Medicine

## 2015-12-27 VITALS — BP 137/78 | HR 79 | Temp 98.3°F | Ht 60.0 in | Wt 224.7 lb

## 2015-12-27 DIAGNOSIS — F4321 Adjustment disorder with depressed mood: Secondary | ICD-10-CM | POA: Diagnosis not present

## 2015-12-27 MED ORDER — HYDROXYZINE HCL 10 MG PO TABS: 10.0000 mg | ORAL_TABLET | Freq: Every evening | ORAL | Status: DC | PRN

## 2015-12-27 NOTE — Patient Instructions (Signed)
Sent in hydroxyzine Take 1 pill at bedtime before sleep Can take another (2 total) if the 1 doesn't help Call with any questions  I'll see you in a week.  Be well, Dr. Ardelia Mems

## 2015-12-27 NOTE — Progress Notes (Addendum)
Date of Visit: 12/27/2015   HPI:  Patient presents for a same day appointment to discuss stress.  Patient reports a series of stressful, grief filled events in the recent several weeks: - On 12-11-2022, her mother suddenly died. She had been ill but it was not an expected death.  - One of the family members who was present at her mother's funeral service died shortly thereafter. Patient attended that person's funeral as well. - On 2022/12/24, her son's baby died in utero. Her son's girlfriend had presented to the hospital on her due date, and they were not able to find the baby's heartbeat. She had to vaginally deliver the deceased baby.  Patient reports experiencing detachment from her emotions. Feels like she's in a dream. Not crying much or talking to others much about how she's been feeling. Has been supporting her other family members, especially her son, as he is intensely grieving the loss of his unborn child. She has been staying with her sister, and sleeping in the same bed her mom used to sleep in.  Having lots of trouble resting and sleeping. Has not been working.  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 She identifies the difference though, in being involved in the grief of others at work, and now having to live in that space with her own intense grief now. She has thought about contacting hospice for grief counseling but has not yet made that contact. Today, she got up and knew something had to change, so she called to schedule a same day appointment here at the [facility].   She denies any plans or thoughts of harming herself. Does endorse some passive thoughts of thinking it would be better to not be here than to be here. Again, no plans. No thoughts of harming others. She has a history of possible depression around 20 years ago, shortly after she delivered her son. Took zoloft during that time but stopped eventually  because she felt better. No history of self harm.   Has family history of depression in her mother. Has a brother with cognitive delay. No other family history of mental illness.  PHQ-9 23, very difficult GAD-7 12, somewhat difficult  ROS: See HPI  [place]: history of allergic rhinitis, hypertension, hyperlipidemia, overactive bladder, subclinical hyperthyroidism  PHYSICAL EXAM: BP 137/78 mmHg  Pulse 79  Temp(Src) 98.3 F (36.8 C) (Oral)  Ht 5' (1.524 m)  Wt 224 lb 11.2 oz (101.923 kg)  BMI 43.88 kg/m2 Gen: NAD, cooperative and pleasant Psych: initially affect quite flat with slowed speech, evidence of psychomotor slowing in general. Later on, patient shows normal range of affect and is more engaged, with better eye contact and normal speech volume and rate. She is well groomed. Does not appear to be responding to internal stimuli. Good insight. Good judgment. Mood detached.  ASSESSMENT/PLAN:  Grief reaction Acutely grieving in setting of three major losses all within the last few weeks. Patient initially presented as having psychomotor slowing and appeared quite detached emotionally from her recent experiences. After talking more and brainstorming ways in which she can begin a journey towards healing, she became more engaged, her affect was brighter, and her speech normalized in rate. She denies any active plans or thoughts of harming herself.   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 Still, it is an entirely different experience for her since she  is the one who has suffered intense losses. We had a long talk about grief, healing, and allowing herself to experience her emotions and share them with others. She was engaged in this discussion.  Together, we came up with the following plan: - she will contact hospice grief counseling to set up an appointment  - she will reach out to a chaplain colleague at [facility] to meet with him as an  additional source of support - start low dose hydroxyzine 10mg  at night to help with sleep (reports strong sensitivity to sedating effects of benadryl). Given parameters for increase if 10mg  does not work - follow up with me in 1 week to see how she's doing - contracted for safety, she will call us if she has thoughts of self harm    FOLLOW UP: Follow up in 1 week for grief.  [place]. Ardelia Mems, [place] than 25 minutes were spent during this encounter, with at least 50% of the time devoted to face to face counseling and coordination of care.

## 2015-12-30 DIAGNOSIS — F4321 Adjustment disorder with depressed mood: Secondary | ICD-10-CM | POA: Insufficient documentation

## 2015-12-30 HISTORY — DX: Adjustment disorder with depressed mood: F43.21

## 2015-12-30 NOTE — Assessment & Plan Note (Signed)
Acutely grieving in setting of three major losses all within the last few weeks. Patient initially presented as having psychomotor slowing and appeared quite detached emotionally from her recent experiences. After talking more and brainstorming ways in which she can begin a journey towards healing, she became more engaged, her affect was brighter, and her speech normalized in rate. She denies any active plans or thoughts of harming herself.   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 Still, it is an entirely different experience for her since she is the one who has suffered intense losses. We had a long talk about grief, healing, and allowing herself to experience her emotions and share them with others. She was engaged in this discussion.  Together, we came up with the following plan: - she will contact hospice grief counseling to set up an appointment  - she will reach out to a chaplain colleague at [facility] to meet with him as an additional source of support - start low dose hydroxyzine 10mg  at night to help with sleep (reports strong sensitivity to sedating effects of benadryl). Given parameters for increase if 10mg  does not work - follow up with me in 1 week to see how she's doing - contracted for safety, she will call us if she has thoughts of self harm

## 2016-01-12 ENCOUNTER — Other Ambulatory Visit: Payer: Self-pay | Admitting: Family Medicine

## 2016-01-20 DIAGNOSIS — Z96642 Presence of left artificial hip joint: Secondary | ICD-10-CM | POA: Diagnosis not present

## 2016-01-20 DIAGNOSIS — Z09 Encounter for follow-up examination after completed treatment for conditions other than malignant neoplasm: Secondary | ICD-10-CM | POA: Diagnosis not present

## 2016-01-20 DIAGNOSIS — M7061 Trochanteric bursitis, right hip: Secondary | ICD-10-CM | POA: Diagnosis not present

## 2016-01-27 DIAGNOSIS — H5213 Myopia, bilateral: Secondary | ICD-10-CM | POA: Diagnosis not present

## 2016-01-27 DIAGNOSIS — H5210 Myopia, unspecified eye: Secondary | ICD-10-CM | POA: Diagnosis not present

## 2016-01-31 DIAGNOSIS — Z0100 Encounter for examination of eyes and vision without abnormal findings: Secondary | ICD-10-CM | POA: Diagnosis not present

## 2016-01-31 DIAGNOSIS — H524 Presbyopia: Secondary | ICD-10-CM | POA: Diagnosis not present

## 2016-01-31 DIAGNOSIS — H5203 Hypermetropia, bilateral: Secondary | ICD-10-CM | POA: Diagnosis not present

## 2016-01-31 DIAGNOSIS — H52209 Unspecified astigmatism, unspecified eye: Secondary | ICD-10-CM | POA: Diagnosis not present

## 2016-02-03 ENCOUNTER — Ambulatory Visit: Payer: Commercial Managed Care - HMO | Admitting: Family Medicine

## 2016-02-03 ENCOUNTER — Encounter: Payer: Self-pay | Admitting: Family Medicine

## 2016-02-03 ENCOUNTER — Ambulatory Visit (INDEPENDENT_AMBULATORY_CARE_PROVIDER_SITE_OTHER): Payer: Commercial Managed Care - HMO | Admitting: Family Medicine

## 2016-02-03 VITALS — BP 140/82 | HR 72 | Temp 97.7°F | Ht 60.0 in | Wt 220.7 lb

## 2016-02-03 DIAGNOSIS — I10 Essential (primary) hypertension: Secondary | ICD-10-CM | POA: Diagnosis not present

## 2016-02-03 DIAGNOSIS — F4321 Adjustment disorder with depressed mood: Secondary | ICD-10-CM | POA: Diagnosis not present

## 2016-02-03 MED ORDER — HYDROXYZINE HCL 10 MG PO TABS: 10.0000 mg | ORAL_TABLET | Freq: Every evening | ORAL | Status: DC | PRN

## 2016-02-03 NOTE — Progress Notes (Signed)
Subjective:     Patient ID: [name], female   DOB: 03/24/1956, 60 y.o.   MRN: [id number]  HPI  [name] is a 60 y.o. Female with history of hypertension who presents for follow-up of blood pressure and depression.  Hypertension: Currently managed @ 140/82. She says she was referred to [facility] from her eye doctor after he noticed retinal exudate concerning for recent hypertension. She has been taking HCTZ and Metoprolol, but says she has been taking less Metoprolol than recommended for the past few years up until yesterday. She says she has been feeling dizzy when standing up lately but denies any falls. She attributes this to recent stress involving the loss of her mother on Feb 27th and granddaughter on March 5th. She does not currently exercise but plans on walking more often. She says she tries to restrict her salt intake and eats a balanced diet consisting of meats, vegetables, and fruits. She denies smoking or alcohol use.   Depression: She lost her mother on Feb 27th and granddaughter on March 5th. She thinks she has emotionally improved since the last visit. She has returned to work and says she feels ok. She denies urges to harm herself or others.  Poor sleep: Her sleep has significantly improved since her last visit after starting Hydroxyzine.    Review of Systems Normal other than stated in HPI    Objective:   Physical Exam Filed Vitals:   02/03/16 1516 02/03/16 1637  BP: 147/83 140/82  Pulse: 72   Temp: 97.7 F (36.5 C)    GEN: Awake, aware, conversant and compliant. Healthy appearing.  CV: Mild systolic murmor over left sternal border. RRR. No rubs or gallops.  Pulm: Clear to auscultation bilaterally. No increased work of breathing.     Assessment:     [name] is a 60 y.o. Female with history of hypertension who presents for follow-up of blood pressure and depression. Her blood pressure and depression appear to be well managed.     Plan:      Hypertension: Appears to be under control today @ 140/82. Recommend continuing blood pressure medications and improving diet and exercise.   Depression: Appears to be managed and improving. Continue to monitor and re-evaluate at next clinical visit.   Poor Sleep: Recommend continuing Hydroxyzine 1-2 tablets by mouth at bedtime as needed for sleep improvement. Ordered a refill.

## 2016-02-03 NOTE — Patient Instructions (Addendum)
Great to see you again today.  Blood pressure looks good  Follow up in 3 months for your blood pressure  Check it at home a few times a week If getting over 140/90 frequently please come in to be seen  Stay on same medications Keep the metoprolol twice a day  Sent in hydroxyzine  Be well, Dr. Ardelia Mems

## 2016-02-07 ENCOUNTER — Telehealth: Payer: Self-pay | Admitting: Family Medicine

## 2016-02-07 NOTE — Telephone Encounter (Signed)
Returned call to patient to discuss, no answer. Left voicemail asking her to call back. Red team, can you call her and obtain more info about what she needs?  Thanks, Leeanne Rio, MD

## 2016-02-07 NOTE — Assessment & Plan Note (Signed)
Well controlled based on today's reading. Patient will continue HCTZ and metoprolol (bid dosing) She will check blood pressure at home & contact us if she persistently gets >140/90. Follow up with PCP in 3 months.

## 2016-02-07 NOTE — Assessment & Plan Note (Signed)
Improved. Continue hydroxyzine low dose as needed for sleep. Continue seeing hospice counselor. Follow up as needed.

## 2016-02-07 NOTE — Telephone Encounter (Signed)
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  Will not be able to work for 3 days.  Please contact patient to discuss

## 2016-02-16 NOTE — Telephone Encounter (Signed)
Patient called back & I spoke with her. She just needed a letter saying that she was seen here at the [facility] for her blood pressure. Advised I will write this letter, sign & place at front desk for her. She was appreciative.  Chrisandra Netters, MD [place]

## 2016-02-16 NOTE — Telephone Encounter (Signed)
Left message on voicemail for patient to call back. 

## 2016-03-27 ENCOUNTER — Other Ambulatory Visit: Payer: Self-pay | Admitting: Family Medicine

## 2016-04-12 ENCOUNTER — Encounter: Payer: Self-pay | Admitting: Family Medicine

## 2016-04-12 ENCOUNTER — Ambulatory Visit (INDEPENDENT_AMBULATORY_CARE_PROVIDER_SITE_OTHER): Payer: Commercial Managed Care - HMO | Admitting: Family Medicine

## 2016-04-12 VITALS — BP 144/88 | HR 74 | Temp 98.2°F | Ht 62.0 in | Wt 221.2 lb

## 2016-04-12 DIAGNOSIS — Q829 Congenital malformation of skin, unspecified: Secondary | ICD-10-CM | POA: Diagnosis not present

## 2016-04-12 DIAGNOSIS — B354 Tinea corporis: Secondary | ICD-10-CM | POA: Diagnosis not present

## 2016-04-12 DIAGNOSIS — L858 Other specified epidermal thickening: Secondary | ICD-10-CM | POA: Insufficient documentation

## 2016-04-12 MED ORDER — CLOBETASOL PROPIONATE 0.05 % EX OINT: 1.0000 "application " | TOPICAL_OINTMENT | Freq: Two times a day (BID) | CUTANEOUS | Status: DC

## 2016-04-12 NOTE — Patient Instructions (Signed)
Thank you for coming in to clinic today.  1. The rash on your lower leg seems very benign or a safe problem. I do not think it is a fungal infection or eczema or infection. It seems darkening of the hair follicles, which can be very normal, I am not positive why it is so itchy and persistent over 1.5 years. Perhaps if scratching or other irritation it can persist and cause thickening skin. - use high potency steroid, Clobetasol on this area apply twice a day for 2 weeks then stop, this can lighten the pigment of the skin slightly do not use on other areas, also it should hopefully calm this rash down If still not improving, then can return and discuss dermatology referral  2. For Rash on Left shoulder - this is most likely a ring worm infection - Try topical Clotrimazole twice a day for 2 to 4 weeks or until healed - if not improving, then may try topical antibiotic ointment  Please schedule a follow-up appointment with Dr Brita Romp within 4 to 6 weeks to follow-up Rashes  If you have any other questions or concerns, please feel free to call the clinic to contact me. You may also schedule an earlier appointment if necessary.  However, if your symptoms get significantly worse, please go to the Emergency Department to seek immediate medical attention.  Nobie Putnam, [place]

## 2016-04-12 NOTE — Progress Notes (Addendum)
Subjective:    Patient ID: [name], female    DOB: Dec 31, 1955, 60 y.o.   MRN: [id number]  [name] is a 60 y.o. female presenting on 04/12/2016 for Tinea and rash on legs   Patient presents for a same day appointment.   HPI  KERATOSIS PILARIS / RIGHT Lower Leg: - Reports chronic problem with Right lower leg inside aspect with slightly thickened skin and darkened spots at base of hair follicles, admits primary concern is that this area itches. Duration of problem is difficult to tell, per patient >6 months, however chart review >1.5 years, gradual worsening and larger area. She has tried topical steroids with kenalog for brief period and also lotrimin cream without relief. - Denies any redness raised rash, swelling, pain or ulceration.  TINEA CORPORIS / Left lateral shoulder: - Reports new problem onset 1 week ago with small red raised circular rash on outside of left shoulder, she was outdoors around time onset, thought bug bite, had small raised red spot but it got bigger, did not see any tick or insect in particular that caused bite. No other close contacts with similar rash. - Admits some itching and discomfort - Tried topical alcohol rub seems to have irritated it - Denies fever, spreading rash, drainage of pus   Social History  Substance Use Topics  . Smoking status: Never Smoker   . Smokeless tobacco: Never Used  . Alcohol Use: No    Review of Systems Per HPI unless specifically indicated above     Objective:    BP 144/88 mmHg  Pulse 74  Temp(Src) 98.2 F (36.8 C) (Oral)  Ht 5\' 2"  (1.575 m)  Wt 221 lb 3.2 oz (100.336 kg)  BMI 40.45 kg/m2  Wt Readings from Last 3 Encounters:  04/12/16 221 lb 3.2 oz (100.336 kg)  02/03/16 220 lb 11.2 oz (100.109 kg)  12/27/15 224 lb 11.2 oz (101.923 kg)    Physical Exam  Constitutional: She appears well-developed and well-nourished. No distress.  Well-appearing, comfortable, cooperative  HENT:  Mouth/Throat:  Oropharynx is clear and moist.  No mucosal lesions  Skin: Skin is warm and dry. Rash noted. She is not diaphoretic.  Rash: Right Lower leg medial aspect 10 x 5 cm area of slightly thickened skin with hyperpigmented base of hair follicles without erythema, edema, tenderness or other rash. Other hair follicles on bilateral lower legs with similar appearing but not as severe darkened hair follicles  Left Lateral Shoulder with 1 x 1 cm raised red annular rash with some superficial healing skin and inflammation, no drainage, non-tender, no surrounding rash or other similar lesions  Nursing note and vitals reviewed.   Right medial lower leg pictured        Assessment & Plan:   Problem List Items Addressed This Visit    Keratosis pilaris - Primary    Most consistent diagnosis for this chronic problem, it is benign appearing mostly localized to Right lower leg, other areas with hyperpigmented hair follicles without significant skin changes and asymptomatic. - Failed lower potency steroids, topical anti fungals - Discussed with Dr Gwendlyn Deutscher  Plan: 1. Trial on high potency topical steroid with Clobetasol BID x 2 weeks then stop, ideally to break itch cycle and reduce irritation, may have chronic recurrences 2. Routine skin care with moisturizers 3. Follow-up as needed - no indication for biopsy, future consider dermatology if unable to control with high dose steroids      Relevant Medications   clobetasol  ointment (TEMOVATE) 0.05 %    Other Visit Diagnoses    Tinea corporis        Start topical OTC Clotrimazole BID x 2-4 weeks, already has, did not need rx sent, return criteria given       Meds ordered this encounter  Medications  . clobetasol ointment (TEMOVATE) 0.05 %    Sig: Apply 1 application topically 2 (two) times daily. On Right lower leg affected area. For up to 2 weeks    Dispense:  30 g    Refill:  0    Follow up plan: Return in about 4 weeks (around 05/10/2016), or if  symptoms worsen or fail to improve, for rash.    Nobie Putnam, [place], PGY-3

## 2016-04-12 NOTE — Assessment & Plan Note (Addendum)
Most consistent diagnosis for this chronic problem, it is benign appearing mostly localized to Right lower leg, other areas with hyperpigmented hair follicles without significant skin changes and asymptomatic. - Failed lower potency steroids, topical anti fungals - Discussed with Dr Gwendlyn Deutscher  Plan: 1. Trial on high potency topical steroid with Clobetasol BID x 2 weeks then stop, ideally to break itch cycle and reduce irritation, may have chronic recurrences 2. Routine skin care with moisturizers 3. Follow-up as needed - no indication for biopsy, future consider dermatology if unable to control with high dose steroids

## 2016-04-27 ENCOUNTER — Other Ambulatory Visit: Payer: Self-pay | Admitting: Family Medicine

## 2016-06-17 ENCOUNTER — Other Ambulatory Visit: Payer: Self-pay | Admitting: Family Medicine

## 2016-06-21 ENCOUNTER — Other Ambulatory Visit: Payer: Self-pay | Admitting: Internal Medicine

## 2016-06-21 DIAGNOSIS — Z1231 Encounter for screening mammogram for malignant neoplasm of breast: Secondary | ICD-10-CM

## 2016-06-28 ENCOUNTER — Ambulatory Visit
Admission: RE | Admit: 2016-06-28 | Discharge: 2016-06-28 | Disposition: A | Discharge status: Home / Self Care | Payer: Commercial Managed Care - HMO | Source: Ambulatory Visit | Attending: Internal Medicine | Admitting: Internal Medicine

## 2016-06-28 ENCOUNTER — Ambulatory Visit: Payer: Commercial Managed Care - HMO

## 2016-06-28 DIAGNOSIS — Z1231 Encounter for screening mammogram for malignant neoplasm of breast: Secondary | ICD-10-CM

## 2016-07-11 IMAGING — NM NM MISC PROCEDURE
6 series · 36 of 36 positions shown · non-contrast
Comparison: none

[Series 1: wbr rest · 6.40mm/px · 6 of 63 frames shown]
[frame 6/63]
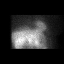
[frame 16/63]
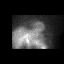
[frame 27/63]
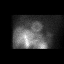
[frame 37/63]
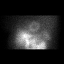
[frame 48/63]
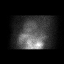
[frame 58/63]
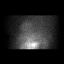

[Series 1: wbr_r-proj_st wbr rest · 6.40mm/px · 6 of 64 frames shown]
[frame 6/64]
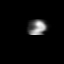
[frame 16/64]
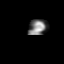
[frame 27/64]
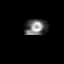
[frame 38/64]
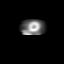
[frame 48/64]
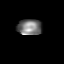
[frame 59/64]
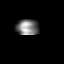

[Series 2: wbr stress-gsp · 6.40mm/px · 6 of 511 frames shown]
[frame 43/511  full-range]
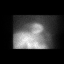
[frame 128/511  full-range]
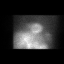
[frame 213/511  full-range]
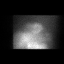
[frame 298/511  full-range]
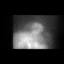
[frame 383/511  full-range]
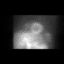
[frame 469/511  full-range]
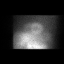

[Series 2: wbr_s-proj_st wbr stress-gsp · 6.40mm/px · 6 of 512 frames shown]
[frame 43/512]
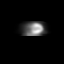
[frame 128/512]
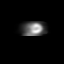
[frame 214/512]
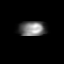
[frame 299/512]
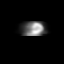
[frame 384/512]
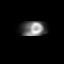
[frame 470/512]
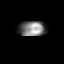

[Series 3: wbr_s-proj_st wbr stress-sum-em · 6.40mm/px · 6 of 64 frames shown]
[frame 6/64]
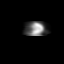
[frame 16/64]
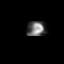
[frame 27/64]
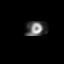
[frame 38/64]
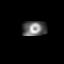
[frame 48/64]
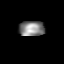
[frame 59/64]
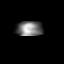

[Series 3: wbr stress-sum-em · 6.40mm/px · 6 of 64 frames shown]
[frame 6/64]
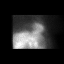
[frame 16/64]
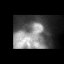
[frame 27/64]
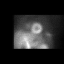
[frame 38/64]
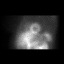
[frame 48/64]
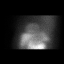
[frame 59/64]
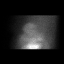

[36 of 36 positions shown; findings below may reference images not displayed]

Canned report from images found in remote index.

Refer to host system for actual result text.

## 2016-08-08 ENCOUNTER — Other Ambulatory Visit: Payer: Self-pay | Admitting: *Deleted

## 2016-08-08 MED ORDER — LORATADINE 10 MG PO TABS: 10.0000 mg | ORAL_TABLET | Freq: Every day | ORAL | 11 refills | Status: DC

## 2016-08-14 ENCOUNTER — Ambulatory Visit: Payer: Commercial Managed Care - HMO

## 2016-08-15 ENCOUNTER — Telehealth: Payer: Self-pay | Admitting: Family Medicine

## 2016-08-15 DIAGNOSIS — M6788 Other specified disorders of synovium and tendon, other site: Secondary | ICD-10-CM

## 2016-08-15 NOTE — Telephone Encounter (Signed)
Will forward to PCP, previous referral has expired.

## 2016-08-15 NOTE — Telephone Encounter (Signed)
Needs referral for [facility] Dr Mayer Camel.  Will be seeing dr about left heel.

## 2016-08-16 NOTE — Telephone Encounter (Signed)
New referral placed.   Virginia Crews, MD, MPH PGY-3,  [facility] 08/16/2016 9:05 AM

## 2016-08-29 ENCOUNTER — Encounter: Payer: Self-pay | Admitting: Family Medicine

## 2016-08-29 ENCOUNTER — Ambulatory Visit (INDEPENDENT_AMBULATORY_CARE_PROVIDER_SITE_OTHER): Payer: Commercial Managed Care - HMO | Admitting: Family Medicine

## 2016-08-29 VITALS — BP 132/83 | HR 70 | Temp 98.3°F | Ht 62.0 in | Wt 215.2 lb

## 2016-08-29 DIAGNOSIS — M791 Myalgia, unspecified site: Secondary | ICD-10-CM

## 2016-08-29 MED ORDER — MELOXICAM 15 MG PO TABS: 15.0000 mg | ORAL_TABLET | Freq: Every day | ORAL | 0 refills | Status: DC
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
## 2016-09-13 ENCOUNTER — Other Ambulatory Visit: Payer: Self-pay | Admitting: Family Medicine

## 2016-09-14 DIAGNOSIS — M722 Plantar fascial fibromatosis: Secondary | ICD-10-CM | POA: Diagnosis not present

## 2016-09-14 DIAGNOSIS — M67911 Unspecified disorder of synovium and tendon, right shoulder: Secondary | ICD-10-CM | POA: Diagnosis not present

## 2016-09-14 MED ORDER — CLOTRIMAZOLE 1 % EX CREA: 1.0000 "application " | TOPICAL_CREAM | Freq: Two times a day (BID) | CUTANEOUS | 1 refills | Status: DC

## 2016-09-20 DIAGNOSIS — M722 Plantar fascial fibromatosis: Secondary | ICD-10-CM | POA: Diagnosis not present

## 2016-09-20 DIAGNOSIS — M7541 Impingement syndrome of right shoulder: Secondary | ICD-10-CM | POA: Diagnosis not present

## 2016-09-29 DIAGNOSIS — M722 Plantar fascial fibromatosis: Secondary | ICD-10-CM | POA: Diagnosis not present

## 2016-09-29 DIAGNOSIS — M7541 Impingement syndrome of right shoulder: Secondary | ICD-10-CM | POA: Diagnosis not present

## 2016-10-05 DIAGNOSIS — M67911 Unspecified disorder of synovium and tendon, right shoulder: Secondary | ICD-10-CM | POA: Diagnosis not present

## 2016-10-05 DIAGNOSIS — M7061 Trochanteric bursitis, right hip: Secondary | ICD-10-CM | POA: Diagnosis not present

## 2016-10-05 DIAGNOSIS — M722 Plantar fascial fibromatosis: Secondary | ICD-10-CM | POA: Diagnosis not present

## 2016-10-06 DIAGNOSIS — M722 Plantar fascial fibromatosis: Secondary | ICD-10-CM | POA: Diagnosis not present

## 2016-10-06 DIAGNOSIS — M7541 Impingement syndrome of right shoulder: Secondary | ICD-10-CM | POA: Diagnosis not present

## 2016-10-26 DIAGNOSIS — M722 Plantar fascial fibromatosis: Secondary | ICD-10-CM | POA: Diagnosis not present

## 2016-10-30 DIAGNOSIS — M7541 Impingement syndrome of right shoulder: Secondary | ICD-10-CM | POA: Diagnosis not present

## 2016-10-30 DIAGNOSIS — M722 Plantar fascial fibromatosis: Secondary | ICD-10-CM | POA: Diagnosis not present

## 2016-11-08 ENCOUNTER — Other Ambulatory Visit: Payer: Self-pay | Admitting: Family Medicine

## 2016-11-20 ENCOUNTER — Ambulatory Visit (INDEPENDENT_AMBULATORY_CARE_PROVIDER_SITE_OTHER): Payer: Medicare HMO | Admitting: Internal Medicine

## 2016-11-20 DIAGNOSIS — J040 Acute laryngitis: Secondary | ICD-10-CM | POA: Diagnosis not present

## 2016-11-20 NOTE — Patient Instructions (Signed)
Please follow up with your PCP as discussed. I would continue medications that you are currently taking.

## 2016-11-20 NOTE — Progress Notes (Signed)
   [name] Kerrin Mo, MD Phone: [phone]  Reason For Visit: SDA for URI   # URI  Has been sick starting on Friday.Patient had cough and lost of voice. Patient felt like she had a fever. Patient has some nasal congestion.  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 Gen: NAD, alert, cooperative with exam HEENT: Normal    Neck: No masses palpated. No lymphadenopathy    Ears: Tympanic membranes intact, normal light reflex, no erythema, no bulging    Nose: nasal turbinates mild congestion     Throat: Post-nasal drip  Cardio: regular rate and rhythm, S1S2 heard, no murmurs appreciated Pulm: clear to auscultation bilaterally, no wheezes, rhonchi or rales   Assessment/Plan: See problem based a/p  Laryngitis Laryngitis, cough and congestion likely due to viral URI.  - Continue symptomatic management  - Follow up as needed

## 2016-11-22 NOTE — Assessment & Plan Note (Addendum)
Laryngitis, cough and congestion likely due to viral URI.  - Continue symptomatic management  - Follow up as needed

## 2017-01-15 ENCOUNTER — Other Ambulatory Visit: Payer: Self-pay | Admitting: Family Medicine

## 2017-01-15 DIAGNOSIS — H6980 Other specified disorders of Eustachian tube, unspecified ear: Secondary | ICD-10-CM

## 2017-01-15 MED ORDER — ALBUTEROL SULFATE HFA 108 (90 BASE) MCG/ACT IN AERS: INHALATION_SPRAY | RESPIRATORY_TRACT | 1 refills | Status: DC

## 2017-02-27 ENCOUNTER — Ambulatory Visit (INDEPENDENT_AMBULATORY_CARE_PROVIDER_SITE_OTHER): Payer: Medicare HMO | Admitting: Family Medicine

## 2017-02-27 ENCOUNTER — Encounter: Payer: Self-pay | Admitting: Family Medicine

## 2017-02-27 DIAGNOSIS — R102 Pelvic and perineal pain: Secondary | ICD-10-CM | POA: Diagnosis not present

## 2017-02-27 NOTE — Patient Instructions (Signed)
Nice to see you again today. There were no abnormalities on exam today. We will get an ultrasound to assess your uterus and ovaries to see if we can find a reason for the pain that you've been having.  Take care, Dr. Jacinto Reap

## 2017-02-27 NOTE — Progress Notes (Signed)
   Subjective:   [name] is a 61 y.o. female with a history of HTN, thyromegaly, cystocele, episodic mood disorder, HLD, obesity here for pelvic pain  Cramping, dull pain intermittent pain in pelvis about daily x1 month Only lasts for 5-10 minutes Thinks it is related to menopause and vaginal dryness May be related to being on her feet a lot Thinking about scheduling f/u with OB/GYN - treating her for cystocele and OAB Denies fevers, vaginal bleeding, constipation, N/V/D  Review of Systems:  Per HPI.   Social History: never smoker  Objective:  BP 124/78   Pulse 81   Temp 98 F (36.7 C) (Oral)   Wt 213 lb (96.6 kg)   BMI 38.96 kg/m   Gen:  61 y.o. female in NAD HEENT: NCAT, MMM, anicteric sclerae CV: RRR, no MRG Resp: Non-labored, CTAB, no wheezes noted Abd: Soft, NTND, BS present, no guarding or organomegaly GYN:  External genitalia within normal limits.  Vaginal mucosa pink, dry.  Nonfriable cervix without lesions, no discharge or bleeding noted on speculum exam.  Bimanual exam revealed normal, nongravid uterus.  No cervical motion tenderness. No adnexal masses bilaterally.   Ext: WWP, no edema MSK: Gait intact Neuro: Alert and oriented, speech normal       Chemistry      Component Value Date/Time   NA 140 05/03/2015 1559   K 3.5 05/03/2015 1559   CL 105 05/03/2015 1559   CO2 26 05/03/2015 1559   BUN 10 05/03/2015 1559   CREATININE 0.75 05/03/2015 1559      Component Value Date/Time   CALCIUM 10.6 (H) 05/03/2015 1559   ALKPHOS 120 (H) 01/15/2014 1509   AST 17 01/15/2014 1509   ALT 14 01/15/2014 1509   BILITOT 0.2 01/15/2014 1509      Lab Results  Component Value Date   WBC 13.2 (H) 10/03/2013   HGB 11.1 (L) 10/03/2013   HCT 34.6 (L) 10/03/2013   MCV 84.4 10/03/2013   PLT 284 10/03/2013   Lab Results  Component Value Date   TSH 0.341 (L) 05/03/2015   Lab Results  Component Value Date   HGBA1C 5.2 05/03/2015   Assessment & Plan:       [name] is a 61 y.o. female here for   Pelvic pain No abnormalities found on exam Could be related to possible fibroid uterus the uterus does not feel large on exam Does not seem to be GI cause Also the possibility it is related to weak pelvic floor muscles Evaluate with pelvic and transvaginal ultrasounds Pending results, consider physical therapy referral for pelvic floor PT Return precautions discussed   Virginia Crews, MD MPH PGY-3,  [facility] 02/28/2017  8:47 AM

## 2017-02-28 ENCOUNTER — Encounter: Payer: Self-pay | Admitting: Gynecology

## 2017-02-28 NOTE — Assessment & Plan Note (Addendum)
No abnormalities found on exam Could be related to possible fibroid uterus the uterus does not feel large on exam Does not seem to be GI cause Also the possibility it is related to weak pelvic floor muscles Evaluate with pelvic and transvaginal ultrasounds Pending results, consider physical therapy referral for pelvic floor PT Return precautions discussed

## 2017-03-06 ENCOUNTER — Ambulatory Visit (HOSPITAL_COMMUNITY): Payer: Medicare HMO

## 2017-03-13 ENCOUNTER — Ambulatory Visit (HOSPITAL_COMMUNITY)
Admission: RE | Admit: 2017-03-13 | Discharge: 2017-03-13 | Disposition: A | Discharge status: Home / Self Care | Payer: Medicare HMO | Source: Ambulatory Visit | Attending: Family Medicine | Admitting: Family Medicine

## 2017-03-13 DIAGNOSIS — D259 Leiomyoma of uterus, unspecified: Secondary | ICD-10-CM | POA: Diagnosis not present

## 2017-03-13 DIAGNOSIS — D252 Subserosal leiomyoma of uterus: Secondary | ICD-10-CM | POA: Diagnosis not present

## 2017-03-13 DIAGNOSIS — R102 Pelvic and perineal pain: Secondary | ICD-10-CM | POA: Diagnosis not present

## 2017-04-23 ENCOUNTER — Telehealth: Payer: Self-pay | Admitting: Internal Medicine

## 2017-04-23 DIAGNOSIS — D259 Leiomyoma of uterus, unspecified: Secondary | ICD-10-CM

## 2017-04-23 NOTE — Telephone Encounter (Signed)
Pt would to know the results on the ultrasound done May 29.  Pt is still having the same problem. Please advise

## 2017-04-25 NOTE — Telephone Encounter (Signed)
Called patient regarding ultrasound results. No answer. Will attempt to contact again later today.   Adin Hector, MD, MPH PGY-3 [facility] Pager [phone]

## 2017-04-26 NOTE — Telephone Encounter (Signed)
Spoke with patient regarding ultrasound results. Discussed that patient has two uterine fibroids that are the most likely cause of her symptoms, and that her symptoms may not resolve without fibroid removal. Offered to place gynecology referral, however patient declined. Said if she changes her mind she would call clinic to let them know.   Adin Hector, MD, MPH PGY-3 [facility] Pager [phone]

## 2017-04-26 NOTE — Telephone Encounter (Signed)
Pt would like a referral to gyn.  She would like to go to Dr Leo Grosser.  She is with [facility].  Please let pt know when referral has been put in

## 2017-04-30 NOTE — Telephone Encounter (Signed)
LM for patient ok per DPR stating that referral has been placed and she should be hearing from them in about 7-10 days for an appointment and if not she can call their office to inquire about it. Jazmin Hartsell,CMA

## 2017-04-30 NOTE — Addendum Note (Signed)
Addended by: Adin Hector on: 04/30/2017 08:39 AM   Modules accepted: Orders

## 2017-05-24 ENCOUNTER — Ambulatory Visit (INDEPENDENT_AMBULATORY_CARE_PROVIDER_SITE_OTHER): Payer: Medicare HMO | Admitting: Obstetrics & Gynecology

## 2017-05-24 ENCOUNTER — Encounter: Payer: Self-pay | Admitting: Obstetrics & Gynecology

## 2017-05-24 VITALS — BP 134/88 | Ht 61.5 in | Wt 216.0 lb

## 2017-05-24 DIAGNOSIS — Z780 Asymptomatic menopausal state: Secondary | ICD-10-CM | POA: Diagnosis not present

## 2017-05-24 DIAGNOSIS — D252 Subserosal leiomyoma of uterus: Secondary | ICD-10-CM | POA: Diagnosis not present

## 2017-05-24 DIAGNOSIS — Z9189 Other specified personal risk factors, not elsewhere classified: Secondary | ICD-10-CM

## 2017-05-24 DIAGNOSIS — Z779 Other contact with and (suspected) exposures hazardous to health: Secondary | ICD-10-CM

## 2017-05-24 DIAGNOSIS — Z01411 Encounter for gynecological examination (general) (routine) with abnormal findings: Secondary | ICD-10-CM | POA: Diagnosis not present

## 2017-05-24 DIAGNOSIS — N8110 Cystocele, unspecified: Secondary | ICD-10-CM

## 2017-05-24 DIAGNOSIS — Z1151 Encounter for screening for human papillomavirus (HPV): Secondary | ICD-10-CM

## 2017-05-24 DIAGNOSIS — Z6841 Body Mass Index (BMI) 40.0 and over, adult: Secondary | ICD-10-CM

## 2017-05-24 DIAGNOSIS — N812 Incomplete uterovaginal prolapse: Secondary | ICD-10-CM

## 2017-05-24 NOTE — Progress Notes (Signed)
[name] [birth year] [id number]   History:    61 y.o. G4P3A1 married, husband is 57 yo.  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  RP:  Established patient presenting for annual gyn exam   HPI:  Menopause x about age 23.  No PMB. Hot flashes/night sweats on-off since then.  No HRT.  C/O mild Pelvic pressure, especially when standing.  Some Urgency and mild incontinence when bladder is full.  Sexually active occasionally, no pain with IC.  Breasts wnl.  BMs wnl.  Past medical history,surgical history, family history and social history were all reviewed and documented in the EPIC chart.  Gynecologic History No LMP recorded. Patient is postmenopausal. Contraception: post menopausal status Last Pap: 04/2015. Results were: normal Last mammogram: 06/2016. Results were: Neg Dexa: 2016 Colonoscopy:  2015  Obstetric History OB History  Gravida Para Term Preterm AB Living  4 3     1 3   SAB TAB Ectopic Multiple Live Births  1            # Outcome Date GA Lbr Len/2nd Weight Sex Delivery Anes PTL Lv  4 SAB           3 Para           2 Para           1 Para                ROS: A ROS was performed and pertinent positives and negatives are included in the history.  GENERAL: No fevers or chills. HEENT: No change in vision, no earache, sore throat or sinus congestion. NECK: No pain or stiffness. CARDIOVASCULAR: No chest pain or pressure. No palpitations. PULMONARY: No shortness of breath, cough or wheeze. GASTROINTESTINAL: No abdominal pain, nausea, vomiting or diarrhea, melena or bright red blood per rectum. GENITOURINARY: No urinary frequency, urgency, hesitancy or dysuria. MUSCULOSKELETAL: No joint or muscle pain, no back pain, no recent trauma. DERMATOLOGIC: No rash, no itching, no lesions. ENDOCRINE: No polyuria, polydipsia, no heat or cold intolerance. No recent change in weight. HEMATOLOGICAL: No anemia or easy bruising or bleeding. NEUROLOGIC: No headache, seizures, numbness,  tingling or weakness. PSYCHIATRIC: No depression, no loss of interest in normal activity or change in sleep pattern.     Exam:   BP 134/88   Ht 5' 1.5" (1.562 m)   Wt 216 lb (98 kg)   BMI 40.15 kg/m   Body mass index is 40.15 kg/m.  General appearance : Well developed well nourished female. No acute distress HEENT: Eyes: no retinal hemorrhage or exudates,  Neck supple, trachea midline, no carotid bruits, no thyroidmegaly Lungs: Clear to auscultation, no rhonchi or wheezes, or rib retractions  Heart: Regular rate and rhythm, no murmurs or gallops Breast:Examined in sitting and supine position were symmetrical in appearance, no palpable masses or tenderness,  no skin retraction, no nipple inversion, no nipple discharge, no skin discoloration, no axillary or supraclavicular lymphadenopathy Abdomen: no palpable masses or tenderness, no rebound or guarding Extremities: no edema or skin discoloration or tenderness  Pelvic: Vulva:  Normal  Bartholin, Urethra, Skene Glands: Within normal limits             Vagina: No gross lesions or discharge  Cervix: No gross lesions or discharge.  Pap/HPV HR done.  Uterus  AV, normal size, non-tender and mobile.    Adnexa  Without masses or tenderness  Anus and perineum  Normal  Pelvic exam in standing  position with Valsalva:  Uterine prolapse grade 2/3.  Cystocele grade 1/4.  No Rectocele.  Pelvic US 02/2017: Uterus:  Measurements: 5.7 x 4.6 x 4.8 cm. 2.6 x 2.3 x 2.4 cm subserosal fibroid in the left posterior uterine body. 1.1 x 0.9 x 1.0 cm subserosal fibroid in the left uterine fundus.  Endometrium:  Thickness: 3 mm.  No focal abnormality visualized.  Right ovary:  Not discretely visualized.  No adnexal mass is seen.  Left ovary:  Not discretely visualized.  No adnexal mass.  No abnormal free fluid.   Assessment/Plan:  61 y.o. female for annual exam   1. Encounter for gynecological examination with abnormal finding Gyn exam with Uterine prolapse  grade 2/3 and Cystocele grade 1/4.  Pap/HPV HR done.  Breasts wnl.  Will schedule screening Mammo 06/2017.  2. Menopause present No HRT since Menopause about 10 years ago.  Hot flashes/Night sweats on-off.  Recommend not to start on HRT at this point because of the increased risk of Stroke.  PhytoEstrogens as needed (Soy products, Black Kohash).  3. Second degree uterine prolapse Mildly symptomatic.  Eventual possibility of proceeding with Robotic Hysterectomy with suspension of the Utero-Sacral Ligaments discussed.  At this time, recommended to avoid pelvic pressure and start Kegel exercises.  4. Baden-Walker grade 1 cystocele Minimal Bladder descent.  Regular mictions to avoid overfilling of the bladder.  Reduce caffein products to decrease Urgency symptoms.  5. Subserous leiomyoma of uterus  Reassured given that the Fibroids are small and likely to decrease further in size or at least stay stable in Menopause.  6.  Morbid Obesity with BMI of 40.5 Regular physical activity and low carb/low calorie diet recommended.  Counseling on above issues >50% x 15 minutes.  Princess Bruins MD, 2:41 PM 05/24/2017

## 2017-05-24 NOTE — Patient Instructions (Signed)
1. Encounter for gynecological examination with abnormal finding Gyn exam with Uterine prolapse grade 2/3 and Cystocele grade 1/4.  Pap/HPV HR done.  Breasts wnl.  Will schedule screening Mammo 06/2017.  2. Menopause present No HRT since Menopause about 10 years ago.  Hot flashes/Night sweats on-off.  Recommend not to start on HRT at this point because of the increased risk of Stroke.  PhytoEstrogens as needed (Soy products, Black Kohash).  3. Second degree uterine prolapse Mildly symptomatic.  Eventual possibility of proceeding with Robotic Hysterectomy with suspension of the Utero-Sacral Ligaments discussed.  At this time, recommended to avoid pelvic pressure and start Kegel exercises.  4. Baden-Walker grade 1 cystocele Minimal Bladder descent.  Regular mictions to avoid overfilling of the bladder.  Reduce caffein products to decrease Urgency symptoms.  5. Subserous leiomyoma of uterus  Reassured given that the Fibroids are small and likely to decrease further in size or at least stay stable in Menopause.  6.  Morbid Obesity with BMI of 40.5 Regular physical activity and low carb/low calorie diet recommended.   [name], it was a pleasure to meet you today!  I will inform you of your results as soon as available.   Health Maintenance for Postmenopausal Women Menopause is a normal process in which your reproductive ability comes to an end. This process happens gradually over a span of months to years, usually between the ages of 21 and 79. Menopause is complete when you have missed 12 consecutive menstrual periods. It is important to talk with your health care provider about some of the most common conditions that affect postmenopausal women, such as heart disease, cancer, and bone loss (osteoporosis). Adopting a healthy lifestyle and getting preventive care can help to promote your health and wellness. Those actions can also lower your chances of developing some of these common  conditions. What should I know about menopause? During menopause, you may experience a number of symptoms, such as:  Moderate-to-severe hot flashes.  Night sweats.  Decrease in sex drive.  Mood swings.  Headaches.  Tiredness.  Irritability.  Memory problems.  Insomnia.  Choosing to treat or not to treat menopausal changes is an individual decision that you make with your health care provider. What should I know about hormone replacement therapy and supplements? Hormone therapy products are effective for treating symptoms that are associated with menopause, such as hot flashes and night sweats. Hormone replacement carries certain risks, especially as you become older. If you are thinking about using estrogen or estrogen with progestin treatments, discuss the benefits and risks with your health care provider. What should I know about heart disease and stroke? Heart disease, heart attack, and stroke become more likely as you age. This may be due, in part, to the hormonal changes that your body experiences during menopause. These can affect how your body processes dietary fats, triglycerides, and cholesterol. Heart attack and stroke are both medical emergencies. There are many things that you can do to help prevent heart disease and stroke:  Have your blood pressure checked at least every 1-2 years. High blood pressure causes heart disease and increases the risk of stroke.  If you are 59-11 years old, ask your health care provider if you should take aspirin to prevent a heart attack or a stroke.  Do not use any tobacco products, including cigarettes, chewing tobacco, or electronic cigarettes. If you need help quitting, ask your health care provider.  It is important to eat a healthy diet and maintain a  healthy weight. ? Be sure to include plenty of vegetables, fruits, low-fat dairy products, and lean protein. ? Avoid eating foods that are high in solid fats, added sugars, or salt  (sodium).  Get regular exercise. This is one of the most important things that you can do for your health. ? Try to exercise for at least 150 minutes each week. The type of exercise that you do should increase your heart rate and make you sweat. This is known as moderate-intensity exercise. ? Try to do strengthening exercises at least twice each week. Do these in addition to the moderate-intensity exercise.  Know your numbers.Ask your health care provider to check your cholesterol and your blood glucose. Continue to have your blood tested as directed by your health care provider.  What should I know about cancer screening? There are several types of cancer. Take the following steps to reduce your risk and to catch any cancer development as early as possible. Breast Cancer  Practice breast self-awareness. ? This means understanding how your breasts normally appear and feel. ? It also means doing regular breast self-exams. Let your health care provider know about any changes, no matter how small.  If you are 16 or older, have a clinician do a breast exam (clinical breast exam or CBE) every year. Depending on your age, family history, and medical history, it may be recommended that you also have a yearly breast X-ray (mammogram).  If you have a family history of breast cancer, talk with your health care provider about genetic screening.  If you are at high risk for breast cancer, talk with your health care provider about having an MRI and a mammogram every year.  Breast cancer (BRCA) gene test is recommended for women who have family members with BRCA-related cancers. Results of the assessment will determine the need for genetic counseling and BRCA1 and for BRCA2 testing. BRCA-related cancers include these types: ? Breast. This occurs in males or females. ? Ovarian. ? Tubal. This may also be called fallopian tube cancer. ? Cancer of the abdominal or pelvic lining (peritoneal  cancer). ? Prostate. ? Pancreatic.  Cervical, Uterine, and Ovarian Cancer Your health care provider may recommend that you be screened regularly for cancer of the pelvic organs. These include your ovaries, uterus, and vagina. This screening involves a pelvic exam, which includes checking for microscopic changes to the surface of your cervix (Pap test).  For women ages 21-65, health care providers may recommend a pelvic exam and a Pap test every three years. For women ages 31-65, they may recommend the Pap test and pelvic exam, combined with testing for human papilloma virus (HPV), every five years. Some types of HPV increase your risk of cervical cancer. Testing for HPV may also be done on women of any age who have unclear Pap test results.  Other health care providers may not recommend any screening for nonpregnant women who are considered low risk for pelvic cancer and have no symptoms. Ask your health care provider if a screening pelvic exam is right for you.  If you have had past treatment for cervical cancer or a condition that could lead to cancer, you need Pap tests and screening for cancer for at least 20 years after your treatment. If Pap tests have been discontinued for you, your risk factors (such as having a new sexual partner) need to be reassessed to determine if you should start having screenings again. Some women have medical problems that increase the chance of  getting cervical cancer. In these cases, your health care provider may recommend that you have screening and Pap tests more often.  If you have a family history of uterine cancer or ovarian cancer, talk with your health care provider about genetic screening.  If you have vaginal bleeding after reaching menopause, tell your health care provider.  There are currently no reliable tests available to screen for ovarian cancer.  Lung Cancer Lung cancer screening is recommended for adults 25-62 years old who are at high risk for  lung cancer because of a history of smoking. A yearly low-dose CT scan of the lungs is recommended if you:  Currently smoke.  Have a history of at least 30 pack-years of smoking and you currently smoke or have quit within the past 15 years. A pack-year is smoking an average of one pack of cigarettes per day for one year.  Yearly screening should:  Continue until it has been 15 years since you quit.  Stop if you develop a health problem that would prevent you from having lung cancer treatment.  Colorectal Cancer  This type of cancer can be detected and can often be prevented.  Routine colorectal cancer screening usually begins at age 61 and continues through age 72.  If you have risk factors for colon cancer, your health care provider may recommend that you be screened at an earlier age.  If you have a family history of colorectal cancer, talk with your health care provider about genetic screening.  Your health care provider may also recommend using home test kits to check for hidden blood in your stool.  A small camera at the end of a tube can be used to examine your colon directly (sigmoidoscopy or colonoscopy). This is done to check for the earliest forms of colorectal cancer.  Direct examination of the colon should be repeated every 5-10 years until age 97. However, if early forms of precancerous polyps or small growths are found or if you have a family history or genetic risk for colorectal cancer, you may need to be screened more often.  Skin Cancer  Check your skin from head to toe regularly.  Monitor any moles. Be sure to tell your health care provider: ? About any new moles or changes in moles, especially if there is a change in a mole's shape or color. ? If you have a mole that is larger than the size of a pencil eraser.  If any of your family members has a history of skin cancer, especially at a young age, talk with your health care provider about genetic  screening.  Always use sunscreen. Apply sunscreen liberally and repeatedly throughout the day.  Whenever you are outside, protect yourself by wearing long sleeves, pants, a wide-brimmed hat, and sunglasses.  What should I know about osteoporosis? Osteoporosis is a condition in which bone destruction happens more quickly than new bone creation. After menopause, you may be at an increased risk for osteoporosis. To help prevent osteoporosis or the bone fractures that can happen because of osteoporosis, the following is recommended:  If you are 26-48 years old, get at least 1,000 mg of calcium and at least 600 mg of vitamin D per day.  If you are older than age 80 but younger than age 37, get at least 1,200 mg of calcium and at least 600 mg of vitamin D per day.  If you are older than age 22, get at least 1,200 mg of calcium and at least 800  mg of vitamin D per day.  Smoking and excessive alcohol intake increase the risk of osteoporosis. Eat foods that are rich in calcium and vitamin D, and do weight-bearing exercises several times each week as directed by your health care provider. What should I know about how menopause affects my mental health? Depression may occur at any age, but it is more common as you become older. Common symptoms of depression include:  Low or sad mood.  Changes in sleep patterns.  Changes in appetite or eating patterns.  Feeling an overall lack of motivation or enjoyment of activities that you previously enjoyed.  Frequent crying spells.  Talk with your health care provider if you think that you are experiencing depression. What should I know about immunizations? It is important that you get and maintain your immunizations. These include:  Tetanus, diphtheria, and pertussis (Tdap) booster vaccine.  Influenza every year before the flu season begins.  Pneumonia vaccine.  Shingles vaccine.  Your health care provider may also recommend other  immunizations. This information is not intended to replace advice given to you by your health care provider. Make sure you discuss any questions you have with your health care provider. Document Released: 11/24/2005 Document Revised: 04/21/2016 Document Reviewed: 07/06/2015 Elsevier Interactive Patient Education  2018 [employer].  Kegel Exercises Kegel exercises help strengthen the muscles that support the rectum, vagina, small intestine, bladder, and uterus. Doing Kegel exercises can help:  Improve bladder and bowel control.  Improve sexual response.  Reduce problems and discomfort during pregnancy.  Kegel exercises involve squeezing your pelvic floor muscles, which are the same muscles you squeeze when you try to stop the flow of urine. The exercises can be done while sitting, standing, or lying down, but it is best to vary your position. Phase 1 exercises 1. Squeeze your pelvic floor muscles tight. You should feel a tight lift in your rectal area. If you are a female, you should also feel a tightness in your vaginal area. Keep your stomach, buttocks, and legs relaxed. 2. Hold the muscles tight for up to 10 seconds. 3. Relax your muscles. Repeat this exercise 50 times a day or as many times as told by your health care provider. Continue to do this exercise for at least 4-6 weeks or for as long as told by your health care provider. This information is not intended to replace advice given to you by your health care provider. Make sure you discuss any questions you have with your health care provider. Document Released: 09/18/2012 Document Revised: 05/27/2016 Document Reviewed: 08/22/2015 Elsevier Interactive Patient Education  [employer].

## 2017-05-24 NOTE — Addendum Note (Signed)
Addended by: Thurnell Garbe A on: 05/24/2017 04:57 PM   Modules accepted: Orders

## 2017-05-28 LAB — PAP, TP IMAGING W/ HPV RNA, RFLX HPV TYPE 16,18/45: HPV mRNA, High Risk: NOT DETECTED

## 2017-07-07 ENCOUNTER — Other Ambulatory Visit: Payer: Self-pay | Admitting: Family Medicine

## 2017-07-09 NOTE — Telephone Encounter (Signed)
Will forward to new PCP  Chantella Creech, Dionne Bucy, MD, MPH [facility] 07/09/2017 8:22 AM

## 2017-08-07 ENCOUNTER — Other Ambulatory Visit: Payer: Self-pay | Admitting: Internal Medicine

## 2017-08-07 ENCOUNTER — Other Ambulatory Visit: Payer: Self-pay | Admitting: Family Medicine

## 2017-08-07 DIAGNOSIS — Z1231 Encounter for screening mammogram for malignant neoplasm of breast: Secondary | ICD-10-CM

## 2017-08-20 ENCOUNTER — Encounter: Payer: Self-pay | Admitting: Student

## 2017-08-20 ENCOUNTER — Ambulatory Visit (INDEPENDENT_AMBULATORY_CARE_PROVIDER_SITE_OTHER): Payer: Medicare HMO | Admitting: Student

## 2017-08-20 VITALS — BP 122/64 | HR 79 | Temp 98.2°F | Ht 62.0 in | Wt 212.8 lb

## 2017-08-20 DIAGNOSIS — Z23 Encounter for immunization: Secondary | ICD-10-CM | POA: Diagnosis not present

## 2017-08-20 DIAGNOSIS — R238 Other skin changes: Secondary | ICD-10-CM

## 2017-08-20 NOTE — Progress Notes (Signed)
  Subjective:    [name] is a 61 y.o. old female here for "shingles"  HPI Shingles: she reports itching, tingling and burning along the bra lines bilaterally.  This has been going on for 1 week.  She says she has history of herpes in the past. She denies rash this time. She reports flulike symptoms   (fever, myalgia & sore throat) about a week ago that has resolved.  She has not had her flu vaccine yet.  PMH/Problem List: has THYROMEGALY; Subclinical hyperthyroidism; Hyperlipidemia; Obesity; UNSPECIFIED EPISODIC MOOD DISORDER; HYPERTENSION, BENIGN ESSENTIAL; ALLERGIC RHINITIS, SEASONAL; Back pain; Hip arthritis; Vaginal itching; Breast pain, right; Tinea versicolor; Laryngitis; Chest pain; Healthcare maintenance; Hip pain; Achilles tendinosis; OAB (overactive bladder); Urgency incontinence; Cystocele; Grief reaction; Keratosis pilaris; and Pelvic pain on their problem list.   has a past medical history of Allergy, Anxiety, Arthritis, Carpal tunnel syndrome, bilateral, Chest pain, Heart murmur, Hyperlipidemia, Hypertension, and Normal cardiac stress test.  FH:  Family History  Problem Relation Age of Onset  . Hypertension Mother   . Diabetes Father   . Hyperlipidemia Brother   . Breast cancer Paternal Grandmother   . Heart attack Neg Hx   . Sudden death Neg Hx   . Colon cancer Neg Hx     SH Social History   Tobacco Use  . Smoking status: Never Smoker  . Smokeless tobacco: Never Used  Substance Use Topics  . Alcohol use: No    Alcohol/week: 0.0 oz  . Drug use: No    Review of Systems Review of systems negative except for pertinent positives and negatives in history of present illness above.     Objective:     Vitals:   08/20/17 1502  BP: 122/64  Pulse: 79  Temp: 98.2 F (36.8 C)  TempSrc: Oral  SpO2: 99%  Weight: 212 lb 12.8 oz (96.5 kg)  Height: 5\' 2"  (1.575 m)   Body mass index is 38.92 kg/m.  Physical Exam GENERAL: appears well, no ditress LUNGS:  No  IWOB HEART:  RRR with no M/R/G SKIN: No notable lesion.  She is pointing at bra lines bilaterally when asked where her symptoms are.  NEURO:  A&O x 3 PSYCH: normal affect Assessment and Plan:  1. Skin irritation: Likely from her bra line.  She is pointing at the bra line bilaterally which makes shingles unlikely.  I suggested wearing a loose fitting bra or sports bra.  I also suggested trying some skin moisturizer such as Vaseline or hydrocortisone cream to see if this helps.  Advised her to call the office if she notices any rash or lesion  2. Need for immunization against influenza - Flu Vaccine QUAD 36+ mos IM  Return if symptoms worsen or fail to improve.  Mercy Riding, MD 08/20/17 Pager: [phone]

## 2017-08-20 NOTE — Patient Instructions (Signed)
It was great seeing you today! We have addressed the following issues today  Itching, burning and tingling over upper back: This is likely due to your bra.  Recommend loose fitting or sport bra.  I do not think this is due to shingles.  Please let us know if you notice any rash.  If we did any lab work today, and the results require attention, either me or my nurse will get in touch with you. If everything is normal, you will get a letter in mail and a message via . If you don't hear from [place] in two weeks, please give [place] a call. Otherwise, we look forward to seeing you again at your next visit. If you have any questions or concerns before then, please call the clinic at [phone].  Please bring all your medications to every doctors visit  Sign up for My Chart to have easy access to your labs results, and communication with your Primary care physician.    Please check-out at the front desk before leaving the clinic.    Take Care,   Dr. Cyndia Skeeters

## 2017-08-25 ENCOUNTER — Other Ambulatory Visit: Payer: Self-pay | Admitting: Family Medicine

## 2017-08-30 ENCOUNTER — Ambulatory Visit
Admission: RE | Admit: 2017-08-30 | Discharge: 2017-08-30 | Disposition: A | Discharge status: Home / Self Care | Payer: Medicare HMO | Source: Ambulatory Visit | Attending: Family Medicine | Admitting: Family Medicine

## 2017-08-30 DIAGNOSIS — Z1231 Encounter for screening mammogram for malignant neoplasm of breast: Secondary | ICD-10-CM | POA: Diagnosis not present

## 2017-09-05 DIAGNOSIS — H5203 Hypermetropia, bilateral: Secondary | ICD-10-CM | POA: Diagnosis not present

## 2017-09-05 DIAGNOSIS — H52209 Unspecified astigmatism, unspecified eye: Secondary | ICD-10-CM | POA: Diagnosis not present

## 2017-09-05 DIAGNOSIS — H524 Presbyopia: Secondary | ICD-10-CM | POA: Diagnosis not present

## 2017-11-06 DIAGNOSIS — M25511 Pain in right shoulder: Secondary | ICD-10-CM | POA: Diagnosis not present

## 2017-11-06 DIAGNOSIS — M25551 Pain in right hip: Secondary | ICD-10-CM | POA: Diagnosis not present

## 2017-11-06 DIAGNOSIS — Z96642 Presence of left artificial hip joint: Secondary | ICD-10-CM | POA: Diagnosis not present

## 2017-11-21 DIAGNOSIS — M7061 Trochanteric bursitis, right hip: Secondary | ICD-10-CM | POA: Diagnosis not present

## 2017-11-29 ENCOUNTER — Encounter: Payer: Self-pay | Admitting: Internal Medicine

## 2017-11-29 ENCOUNTER — Ambulatory Visit (INDEPENDENT_AMBULATORY_CARE_PROVIDER_SITE_OTHER): Payer: Medicare HMO | Admitting: Internal Medicine

## 2017-11-29 ENCOUNTER — Other Ambulatory Visit: Payer: Self-pay

## 2017-11-29 VITALS — BP 138/92 | HR 80 | Temp 98.1°F | Ht 62.0 in | Wt 213.4 lb

## 2017-11-29 DIAGNOSIS — N939 Abnormal uterine and vaginal bleeding, unspecified: Secondary | ICD-10-CM

## 2017-11-29 DIAGNOSIS — N950 Postmenopausal bleeding: Secondary | ICD-10-CM | POA: Diagnosis not present

## 2017-11-29 DIAGNOSIS — N840 Polyp of corpus uteri: Secondary | ICD-10-CM | POA: Diagnosis not present

## 2017-11-29 NOTE — Patient Instructions (Signed)
We have done a biopsy of your endometrium, the lining of your uterus. It is sent to a pathologist and will likely take about 1 week for the results. We will then call you. We have also ordered an ultrasound of your uterus. This will measure the thickness of the lining and look for an masses. It is normal to have some spotting and cramping after the biopsy today.    Postmenopausal Bleeding Postmenopausal bleeding is any bleeding a woman has after she has entered into menopause. Menopause is the end of a woman's fertile years. After menopause, a woman no longer ovulates or has menstrual periods. Postmenopausal bleeding can be caused by various things. Any type of postmenopausal bleeding, even if it appears to be a typical menstrual period, is concerning. This should be evaluated by your health care provider. Any treatment will depend on the cause of the bleeding. Follow these instructions at home: Monitor your condition for any changes. The following actions may help to alleviate any discomfort you are experiencing:  Avoid the use of tampons and douches as directed by your health care provider.  Change your pads frequently.  Get regular pelvic exams and Pap tests.  Keep all follow-up appointments for diagnostic tests as directed by your health care provider.  Contact a health care provider if:  Your bleeding lasts more than 1 week.  You have abdominal pain.  You have bleeding with sexual intercourse. Get help right away if:  You have a fever, chills, headache, dizziness, muscle aches, and bleeding.  You have severe pain with bleeding.  You are passing blood clots.  You have bleeding and need more than 1 pad an hour.  You feel faint. This information is not intended to replace advice given to you by your health care provider. Make sure you discuss any questions you have with your health care provider. Document Released: 01/10/2006 Document Revised: 03/09/2016 Document Reviewed:  05/01/2013 Elsevier Interactive Patient Education  [employer].

## 2017-11-29 NOTE — Progress Notes (Signed)
   Subjective:    [name] - 62 y.o. female MRN [id number]  Date of birth: 1955/10/21  HPI  ROGELIO WINBUSH is here for postmenopausal bleeding. Reports it started on 2/10 and continued through 2/13. She described the bleeding as spotting in nature with a very light flow. She had some associated abdominal cramping that has since resolved. Denies blood in stool, hematuria, dysuria, vomiting, fevers, and pelvic pain. She is unsure when her LMP was but reports it was several years ago. Denies lightheadedness, dizziness, SOB, and chest pain.    -  reports that  has never smoked. she has never used smokeless tobacco. - Review of Systems: Per HPI. - Past Medical History: Patient Active Problem List   Diagnosis Date Noted  . Postmenopausal bleeding 11/30/2017  . Pelvic pain 02/27/2017  . Keratosis pilaris 04/12/2016  . Grief reaction 12/30/2015  . OAB (overactive bladder) 09/01/2015  . Urgency incontinence 09/01/2015  . Cystocele 09/01/2015  . 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 Chest pain 05/03/2015  . Healthcare maintenance 05/03/2015  . Hip pain 05/03/2015  . Laryngitis 09/14/2014  . Breast pain, right 08/22/2014  . Tinea versicolor 08/22/2014  . Vaginal itching 05/15/2014  . Hip arthritis 10/01/2013  . Back pain 04/16/2012  . UNSPECIFIED EPISODIC MOOD DISORDER 01/04/2010  . THYROMEGALY 12/30/2009  . ALLERGIC RHINITIS, SEASONAL 12/15/2009  . Hyperlipidemia 11/09/2008  . Subclinical hyperthyroidism 05/08/2007  . HYPERTENSION, BENIGN ESSENTIAL 01/15/2007  . Obesity 12/13/2006   - Medications: reviewed and updated   Objective:   Physical Exam BP (!) 138/92   Pulse 80   Temp 98.1 F (36.7 C) (Oral)   Ht 5\' 2"  (1.575 m)   Wt 213 lb 6.4 oz (96.8 kg)   SpO2 98%   BMI 39.03 kg/m  Gen: NAD, alert, cooperative with exam, well-appearing GU/GYN: Exam performed in the presence of a chaperone. External genitalia within normal limits.  Vaginal mucosa mildly atrophic. Nonfriable  cervix without lesions, no discharge or bleeding noted on speculum exam.  Bimanual exam revealed normal, nongravid uterus.  No cervical motion tenderness. No adnexal masses bilaterally.    PROCEDURE NOTE: Endometrial Biopsy Patient given informed consent, signed copy in the chart.  Appropriate time out taken. . The patient was placed in the lithotomy position and the cervix brought into view with sterile speculum.  The portio of cervix was cleansed x 2 with betadine swabs.  A tenaculum was placed in the anterior lip of the cervix.  A uterine sound was used to measure the uterus. A pipelle was introduced  into the uterus, suction created,  and an endometrial sample was obtained. All equipment was removed and accounted for.  The patient tolerated the procedure well.  Minimal spotting type bleeding.  Patient given post procedure instructions. I will notify her of any pathology results.   Assessment & Plan:   Postmenopausal bleeding Given bleeding postmenopause, patient warrants work up for endometrial cancer. Endometrial biopsy performed today. Patient tolerated well. Will follow up pathology results. Transvaginal US ordered today. Noted that patient had negative PAP in August 2018. Have recommended that patient follow up with her GYN physician after ultrasound performed. Return precautions discussed. Patient does not warrant CBC today as bleeding reported to not be significant, no symptoms of anemia, and no signs on exam.     Phill Myron, D.O. 11/30/2017, 9:03 AM PGY-3, [place]

## 2017-11-30 DIAGNOSIS — N950 Postmenopausal bleeding: Secondary | ICD-10-CM | POA: Insufficient documentation

## 2017-11-30 DIAGNOSIS — N939 Abnormal uterine and vaginal bleeding, unspecified: Secondary | ICD-10-CM | POA: Insufficient documentation

## 2017-11-30 NOTE — Assessment & Plan Note (Addendum)
Given bleeding postmenopause, patient warrants work up for endometrial cancer. Endometrial biopsy performed today. Patient tolerated well. Will follow up pathology results. Transvaginal US ordered today. Noted that patient had negative PAP in August 2018. Have recommended that patient follow up with her GYN physician after ultrasound performed. Return precautions discussed. Patient does not warrant CBC today as bleeding reported to not be significant, no symptoms of anemia, and no signs on exam.

## 2017-12-05 ENCOUNTER — Encounter: Payer: Self-pay | Admitting: Family Medicine

## 2017-12-05 ENCOUNTER — Ambulatory Visit: Payer: Medicare HMO | Admitting: Family Medicine

## 2017-12-05 ENCOUNTER — Ambulatory Visit (HOSPITAL_COMMUNITY)
Admission: RE | Admit: 2017-12-05 | Discharge: 2017-12-05 | Disposition: A | Discharge status: Home / Self Care | Payer: Medicare HMO | Source: Ambulatory Visit | Attending: Family Medicine | Admitting: Family Medicine

## 2017-12-05 VITALS — BP 123/80 | HR 102 | Temp 99.4°F | Wt 214.8 lb

## 2017-12-05 DIAGNOSIS — R079 Chest pain, unspecified: Secondary | ICD-10-CM | POA: Diagnosis not present

## 2017-12-05 DIAGNOSIS — N950 Postmenopausal bleeding: Secondary | ICD-10-CM | POA: Insufficient documentation

## 2017-12-05 DIAGNOSIS — N939 Abnormal uterine and vaginal bleeding, unspecified: Secondary | ICD-10-CM | POA: Diagnosis not present

## 2017-12-05 DIAGNOSIS — B9789 Other viral agents as the cause of diseases classified elsewhere: Secondary | ICD-10-CM

## 2017-12-05 DIAGNOSIS — J069 Acute upper respiratory infection, unspecified: Secondary | ICD-10-CM | POA: Diagnosis not present

## 2017-12-05 DIAGNOSIS — D251 Intramural leiomyoma of uterus: Secondary | ICD-10-CM | POA: Diagnosis not present

## 2017-12-05 NOTE — Progress Notes (Signed)
   Subjective   Patient ID: CARLIS BURNSWORTH    DOB: 1956/06/11, 62 y.o. female   MRN: [id number]  CC: "Chest pain"  HPI: KAYSEN SEFCIK is a 62 y.o. female who presents for a same day appointment for the following:  CHEST PAIN  Onset: 3 days Pain characteristic: Tight Severity: 5/10 Exacerbating factors: Cough Alleviating factors: None Radiation: None  PMH History of blood clot or heart problems or aneurysms: See below, no.  Symptoms Nausea/vomiting: Some nausea Shortness of breath: No Pleuritic pain: Yes Cough: Dry Swelling of legs: No Syncope: No Heart burn or dysphagia: No Immobility: No  Has history of ischemic signs based on exercise stress 06/2015 with Myoview 07/2015 showing EF 87% with ST depression in inferior leads though no signs of ischemia or infarct based on study.  ROS: see HPI for pertinent.  [place]: History of chest pain, HTN, HLD, obesity, subclinical hyperthyroidism, mood disorder.  Surgical history left total hip, polypectomy.  Family history HTN, DM, HLD.  Smoking status reviewed. Medications reviewed.  Objective   BP 123/80   Pulse (!) 102   Temp 99.4 F (37.4 C) (Oral)   Wt 214 lb 12.8 oz (97.4 kg)   SpO2 99%   BMI 39.29 kg/m  Vitals and nursing note reviewed.  General: obese, NAD with non-toxic appearance HEENT: normocephalic, atraumatic, moist mucous membranes, erythematous enlarged tonsils without purulence and postnasal drip present Neck: supple, non-tender without lymphadenopathy Cardiovascular: regular rate and rhythm with systolic murmur without rubs, or gallops Lungs: clear to auscultation bilaterally with normal work of breathing Abdomen: soft, non-tender, non-distended, normoactive bowel sounds Skin: warm, dry, no rashes or lesions, cap refill < 2 seconds Extremities: warm and well perfused, normal tone, no edema  Assessment & Plan   Chest pain Acute.  Triggered with coughing since onset of viral URI symptoms.  Not associated  with exertion or improved with rest.  Does not appear to be cardiac.  Does have a history of Myoview with normal findings and preserved EF.  EKG unchanged from prior without ST changes or T wave abnormalities today. - Reviewed return precautions  Viral URI with cough Acute.  Symptoms consistent with viral infection without signs of secondary bacterial infection.  Lungs clear to auscultation bilaterally.  There is tonsillar edema without purulence. - Advised patient to use honey over-the-counter, and other conservative therapies including increased hydration and Tylenol as needed for fever  Orders Placed This Encounter  Procedures  . EKG 12-Lead   No orders of the defined types were placed in this encounter.   Harriet Butte, [place], PGY-2 12/05/2017, 10:09 AM

## 2017-12-05 NOTE — Patient Instructions (Signed)
Thank you for coming in to see [place] today. Please see below to review our plan for today's visit.  Your symptoms are consistent with viral infection.  Should wash her hands thoroughly to prevent spreading the infection.  He do not need antibiotics.  You can continue taking lozenges for your cough.  I also recommend a tablespoon of honey 3 times daily.  I would avoid over the counter cough suppressants.  Propping herself up with more pillows or sitting up when sleeping will help prevent you from coughing.  Your symptoms should clear by the end of the week.  Coughing can sometimes linger up to 18 days.  Her chest pain does not appear to be related to your heart.  If you develop shortness of breath with her chest pain and is unrelated to your cough or worse with exerting herself, seek medical care immediately.   Please call the clinic at [phone] if your symptoms worsen or you have any concerns. It was our pleasure to serve you.  Harriet Butte, [place], PGY-2

## 2017-12-05 NOTE — Assessment & Plan Note (Signed)
Acute.  Symptoms consistent with viral infection without signs of secondary bacterial infection.  Lungs clear to auscultation bilaterally.  There is tonsillar edema without purulence. - Advised patient to use honey over-the-counter, and other conservative therapies including increased hydration and Tylenol as needed for fever

## 2017-12-05 NOTE — Assessment & Plan Note (Addendum)
Acute.  Triggered with coughing since onset of viral URI symptoms.  Not associated with exertion or improved with rest.  Does not appear to be cardiac.  Does have a history of Myoview with normal findings and preserved EF.  EKG unchanged from prior without ST changes or T wave abnormalities today. - Reviewed return precautions

## 2017-12-06 ENCOUNTER — Ambulatory Visit (HOSPITAL_COMMUNITY)
Admission: RE | Admit: 2017-12-06 | Discharge: 2017-12-06 | Disposition: A | Discharge status: Home / Self Care | Payer: Medicare HMO | Source: Ambulatory Visit | Attending: Family Medicine | Admitting: Family Medicine

## 2017-12-06 DIAGNOSIS — N950 Postmenopausal bleeding: Secondary | ICD-10-CM | POA: Diagnosis not present

## 2017-12-06 DIAGNOSIS — N939 Abnormal uterine and vaginal bleeding, unspecified: Secondary | ICD-10-CM

## 2017-12-06 DIAGNOSIS — D259 Leiomyoma of uterus, unspecified: Secondary | ICD-10-CM | POA: Diagnosis not present

## 2017-12-06 DIAGNOSIS — D251 Intramural leiomyoma of uterus: Secondary | ICD-10-CM | POA: Diagnosis not present

## 2017-12-07 ENCOUNTER — Telehealth: Payer: Self-pay | Admitting: *Deleted

## 2017-12-07 NOTE — Telephone Encounter (Signed)
Pt informed of message from provider. Fleeger, Salome Spotted, CMA

## 2017-12-07 NOTE — Telephone Encounter (Signed)
-----   Message from Nicolette Bang, DO sent at 12/05/2017  8:46 PM EST ----- Please call patient to let her know endometrial biopsy did not show any abnormal cells or malignancy. We will follow up on her pelvic ultrasound results.   Phill Myron, D.O. 12/05/2017, 8:46 PM PGY-3, [place]

## 2017-12-18 ENCOUNTER — Telehealth: Payer: Self-pay | Admitting: Internal Medicine

## 2017-12-18 NOTE — Telephone Encounter (Signed)
Called patient to discuss results of pelvic ultrasound. Small fibroids noted. Endometrium thickened for postmenopausal status. Endometrial biopsy was performed prior to ultrasound and negative. Recommended that patient seen her Ob-GYN to discuss if further work up with sonohysterogram would be warranted. Patient voiced understanding and stated she would call to make an appointment.   Phill Myron, D.O. 12/18/2017, 12:20 PM PGY-3, [place]

## 2017-12-24 ENCOUNTER — Encounter: Payer: Self-pay | Admitting: Obstetrics & Gynecology

## 2017-12-24 ENCOUNTER — Ambulatory Visit: Payer: Medicare HMO | Admitting: Obstetrics & Gynecology

## 2017-12-24 VITALS — BP 130/82

## 2017-12-24 DIAGNOSIS — N950 Postmenopausal bleeding: Secondary | ICD-10-CM | POA: Diagnosis not present

## 2017-12-24 DIAGNOSIS — N840 Polyp of corpus uteri: Secondary | ICD-10-CM | POA: Diagnosis not present

## 2017-12-24 DIAGNOSIS — R9389 Abnormal findings on diagnostic imaging of other specified body structures: Secondary | ICD-10-CM

## 2017-12-24 NOTE — Progress Notes (Signed)
    [name] [birth year] [id number]        61 y.o.  [id number]   RP: Light PMB early February 2019  HPI: Menopause on no HRT.  Mild vaginal spotting early Feb 2019.  Had been sexually active prior to PMB.  Mild occasional pelvic cramping.  No abnormal vaginal d/c.   Seen by Phill Myron who performed an EBx on 11/30/2017.    Last Annual/Gyn exam with me 05/2017.    Patho:  Endometrium, biopsy 11/30/2017 - ENDOMETRIAL POLYP WITH CYSTIC ATROPHY AND BREAKDOWN. - NO HYPERPLASIA, ATYPIA OR MALIGNANCY IDENTIFIED.  Patient had a Pelvic US on 12/06/2017 Uterus Measurements: 7.7 X 4.6 X 5.2 CM. Posterior intramural fibroid measures 2.7 cm. Anterior subserosal fibroid on the left measures 1.4 cm.  Endometrium Thickness: 6 mm in thickness.  No focal abnormality visualized. Right ovary Measurements: 3.6 x 2.0 x 1.6 cm. Normal appearance/no adnexal mass. Left ovary Measurements: Not visualized.  No adnexal mass seen. No abnormal free fluid.   OB History  Gravida Para Term Preterm AB Living  4 3     1 3   SAB TAB Ectopic Multiple Live Births  1            # Outcome Date GA Lbr Len/2nd Weight Sex Delivery Anes PTL Lv  4 SAB           3 Para           2 Para           1 Para               Past medical history,surgical history, problem list, medications, allergies, family history and social history were all reviewed and documented in the EPIC chart.   Directed ROS with pertinent positives and negatives documented in the history of present illness/assessment and plan.  Exam:  Vitals:   12/24/17 1211  BP: 130/82   General appearance:  Normal  Abdomen: Normal  Gynecologic exam: Vulva normal.  Speculum: Cervix and vagina normal.  No cervical polyp seen, no lesion.  No current bleeding.  Bimanual exam: Uterus anteverted overall normal volume, mobile, nontender.  No adnexal mass felt, nontender.   Assessment/Plan:  62 y.o. G4P0013   1. Postmenopausal bleeding Light postmenopausal  bleeding which happen after being sexually active.  Endometrial biopsy done on November 30, 2017 showing endometrial polyp with cystic atrophy and breakdown.  No hyperplasia, atypia or malignancy identified.  Endometrium mildly thickened at 6 mm on pelvic ultrasound on December 06, 2017.  Results discussed with patient and decision to proceed with a sonohysterogram.  Procedure explained to patient who agrees with plan. - [place] Sonohysterogram; Future  2. Endometrial polyp Endometrial polyp with cystic atrophy and breakdown on endometrial biopsy.  We will complete evaluation with a sonohysterogram. - [place] Sonohysterogram; Future  3. Thickened endometrium Endometrium mildly thickened at 6 mm. - [place] Sonohysterogram; Future  Counseling on above issues more than 50% for 25 minutes.  Princess Bruins MD, 12:41 PM 12/24/2017

## 2017-12-24 NOTE — Patient Instructions (Signed)
1. Postmenopausal bleeding Light postmenopausal bleeding which happen after being sexually active.  Endometrial biopsy done on November 30, 2017 showing endometrial polyp with cystic atrophy and breakdown.  No hyperplasia, atypia or malignancy identified.  Endometrium mildly thickened at 6 mm on pelvic ultrasound on December 06, 2017.  Results discussed with patient and decision to proceed with a sonohysterogram.  Procedure explained to patient who agrees with plan. - [place] Sonohysterogram; Future  2. Endometrial polyp Endometrial polyp with cystic atrophy and breakdown on endometrial biopsy.  We will complete evaluation with a sonohysterogram. - [place] Sonohysterogram; Future  3. Thickened endometrium Endometrium mildly thickened at 6 mm. - [place] Sonohysterogram; Future  [name], it was a pleasure seeing you today!  We will proceed with the sonohysterogram at next visit.   Sonohysterogram A sonohysterogram is a procedure to examine the inside of the uterus. This exam uses sound waves that are sent to a computer to make images of the lining of the uterus (endometrium). To get the best images, a germ-free, salt-water solution (sterile saline) is put into the uterus through the vagina. You may have this procedure if you have certain reproductive problems, such as abnormal bleeding, infertility, or miscarriage. This procedure can show what may be causing these problems. Possible causes include scarring or abnormal growths such as fibroids inside your uterus. It can also show if your uterus is an abnormal shape or if the lining of the uterus is too thin. Tell a health care provider about:  All medicines you are taking, including vitamins, herbs, eye drops, creams, and over-the-counter medicines.  Any allergies you have.  Any blood disorders you have.  Any surgeries you have had.  Any medical conditions you have.  Whether you are pregnant or may be pregnant.  The date of the first day of your last  period.  Any signs of infection, such as fever, pain in your lower abdomen, or abnormal discharge from your vagina. What are the risks? Generally, this is a safe procedure. However, problems may occur, including:  Abdominal pain or cramping.  Light bleeding (spotting).  Increased vaginal discharge.  Infection.  What happens before the procedure?  Your health care provider may have you take an over-the-counter pain medicine.  You may be given medicine to stop any abnormal bleeding.  You may be given antibiotic medicine to help prevent infection.  You may be asked to take a pregnancy test. This is usually in the form of a urine test.  You may have a pelvic exam.  You will be asked to empty your bladder. What happens during the procedure?  You will lie down on the exam table with your feet in stirrups or with your knees bent and your feet flat on the table.  A slender, handheld device (transducer) will be lubricated and placed into your vagina.  The transducer will be positioned to send sound waves to your uterus. The sound waves are sent to a computer and are turned into images, which your health care provider sees during the procedure.  The transducer will be removed from your vagina.  An instrument will be inserted to widen the opening of your vagina (speculum).  A swab with germ-killing solution (antiseptic) will be used to clean the opening to your uterus (cervix).  A long, thin tube (catheter) will be placed through your cervix into your uterus.  The speculum will be removed.  The transducer will be placed back into your vagina to take more images.  Your uterus  will be filled with a germ-free, salt-water solution (sterile saline) through the catheter. You may feel some cramping.  A fluid that contains air bubbles may be sent through the catheter to make it easier to see the fallopian tubes.  The transducer and catheter will be removed. The procedure may vary  among health care providers and hospitals. What happens after the procedure?  It is up to you to get the results of your procedure. Ask your health care provider, or the department that is doing the procedure, when your results will be ready. Summary  A sonohysterogram is a procedure that creates images of the inside of the uterus.  The risks of this procedure are very low. Most women experience cramping and spotting after the procedure.  You may need to have a pelvic exam and take a pregnancy test before this procedure. This procedure will not be done if you are pregnant or have an infection. This information is not intended to replace advice given to you by your health care provider. Make sure you discuss any questions you have with your health care provider. Document Released: 02/16/2014 Document Revised: 08/28/2016 Document Reviewed: 08/28/2016 Elsevier Interactive Patient Education  2017 [employer].

## 2018-01-03 ENCOUNTER — Other Ambulatory Visit: Payer: Self-pay | Admitting: Family Medicine

## 2018-01-03 NOTE — Telephone Encounter (Signed)
Will forward to PCP  [place] Winslow, Dionne Bucy, MD, MPH [facility] 01/03/2018 2:06 PM

## 2018-01-22 ENCOUNTER — Other Ambulatory Visit: Payer: Self-pay | Admitting: Obstetrics & Gynecology

## 2018-01-22 DIAGNOSIS — N950 Postmenopausal bleeding: Secondary | ICD-10-CM

## 2018-01-22 DIAGNOSIS — R9389 Abnormal findings on diagnostic imaging of other specified body structures: Secondary | ICD-10-CM

## 2018-02-04 ENCOUNTER — Ambulatory Visit (INDEPENDENT_AMBULATORY_CARE_PROVIDER_SITE_OTHER): Payer: Medicare HMO

## 2018-02-04 ENCOUNTER — Encounter: Payer: Self-pay | Admitting: Obstetrics & Gynecology

## 2018-02-04 ENCOUNTER — Other Ambulatory Visit: Payer: Self-pay | Admitting: Obstetrics & Gynecology

## 2018-02-04 ENCOUNTER — Ambulatory Visit: Payer: Medicare HMO | Admitting: Obstetrics & Gynecology

## 2018-02-04 DIAGNOSIS — R9389 Abnormal findings on diagnostic imaging of other specified body structures: Secondary | ICD-10-CM

## 2018-02-04 DIAGNOSIS — N950 Postmenopausal bleeding: Secondary | ICD-10-CM

## 2018-02-04 DIAGNOSIS — D251 Intramural leiomyoma of uterus: Secondary | ICD-10-CM | POA: Diagnosis not present

## 2018-02-04 DIAGNOSIS — N840 Polyp of corpus uteri: Secondary | ICD-10-CM

## 2018-02-04 NOTE — Patient Instructions (Signed)
1. Postmenopausal bleeding No recurrence of PMB since EBx which was benign with an Endometrial Polyp.  2. Endometrial polyp Endometrial polyp removed with EBx, as no Polyp on Sonohysterogram today.  Procedure well-tolerated.  Informed of results.  Patient reassured.  Will follow-up for annual gynecologic exam August 2019.  Tomi Bamberger, good seeing you today!

## 2018-02-04 NOTE — Progress Notes (Signed)
    [name] 03-04-1956 [id number]        61 y.o.  [id number]   RP: Postmenopausal Bleeding with Endometrial Polyp on EBx for SonoHysterogram  HPI: No PMB since the EBx.  No pelvic pain.     OB History  Gravida Para Term Preterm AB Living  4 3     1 3   SAB TAB Ectopic Multiple Live Births  1            # Outcome Date GA Lbr Len/2nd Weight Sex Delivery Anes PTL Lv  4 SAB           3 Para           2 Para           1 Para             Past medical history,surgical history, problem list, medications, allergies, family history and social history were all reviewed and documented in the EPIC chart.   Directed ROS with pertinent positives and negatives documented in the history of present illness/assessment and plan.  Exam:  There were no vitals filed for this visit. General appearance:  Normal                                                                    Sono Infusion Hysterogram ( procedure note)   The initial transvaginal ultrasound demonstrated the following: T/V and T/a images.  Anteverted uterus measuring 8.11 x 6.0 x 4.63 cm.  Endometrial lining measures 6.0 mm.  Intramural fibroids measuring 2.2 x 1.6 cm, 1.7 x 1.3 cm, 1.4 x 1.7 cm.  Right and left ovaries normal.  No apparent mass seen in the right and left adnexa.  No free fluid in the posterior cul-de-sac.  The speculum  was inserted and the cervix cleansed with Betadine solution after confirming that patient has no allergies.  A small sonohysterography catheterwas utilized.  Insertion was facilitated with ring forceps, using a spear-like motion the catheter was inserted to the fundus of the uterus. The speculum is then removed carefully to avoid dislodging the catheter. The catheter was flushed with sterile saline delete prior to insertion to rid it of small amounts of air.the sterile saline solution was infused into the uterine cavity as a vaginal ultrasound probe was then placed in the vagina for full  visualization of the uterine cavity from a transvaginal approach. The following was noted: Following injection of saline the endometrium is filled with no defect seen.  The catheter was then removed after retrieving some of the saline from the intrauterine cavity. An endometrial biopsy was not done. Patient tolerated procedure well. She had received a tablet of Aleve for discomfort.    Assessment/Plan:  62 y.o. G4P0013   1. Postmenopausal bleeding No recurrence of PMB since EBx which was benign with an Endometrial Polyp.  2. Endometrial polyp Endometrial polyp removed with EBx, as no Polyp on Sonohysterogram today.  Procedure well-tolerated.  Informed of results.  Patient reassured.  Will follow-up for annual gynecologic exam August 2019.  Princess Bruins MD, 10:41 AM 02/04/2018

## 2018-02-22 ENCOUNTER — Other Ambulatory Visit: Payer: Self-pay

## 2018-02-22 ENCOUNTER — Encounter: Payer: Self-pay | Admitting: Internal Medicine

## 2018-02-22 ENCOUNTER — Encounter: Payer: Self-pay | Admitting: Licensed Clinical Social Worker

## 2018-02-22 ENCOUNTER — Ambulatory Visit (INDEPENDENT_AMBULATORY_CARE_PROVIDER_SITE_OTHER): Payer: Medicare HMO | Admitting: Internal Medicine

## 2018-02-22 DIAGNOSIS — F439 Reaction to severe stress, unspecified: Secondary | ICD-10-CM | POA: Diagnosis not present

## 2018-02-22 NOTE — Progress Notes (Signed)
Type of Service: [place] is a 62 y.o. female referred by Dr. Avon Gully for assistance with managing stressors. Patient is pleasant and engaged in conversation. Reports :concerns with sick family member and stress at church.  Issues discussed:  Integrated care services,  managing stress and demonstration of relaxed breathing. Intervention: Reflective listening, supportive counseling, emotional support and relaxed breathing, Assessment/Plan:.Patient is currently experiencing symptoms of stress which are exacerbated by concerns at church and her sick brother.  Patient may benefit from, and is in agreement to receive further assessment and brief therapeutic interventions to assist with managing her stressors. Patient will implement relaxed breathing 3 times a day.  She will schedule a F/U with in the next 2 weeks.   Warm Hand Off Completed.     Casimer Lanius, LCSW Licensed Clinical Social Worker [place]   [phone] 3:51 PM .

## 2018-02-22 NOTE — Patient Instructions (Addendum)
It was nice seeing you today Ms. Notarianni!  If you would like to schedule another appointment with one of our behavioral health consultants, please follow the instructions you discussed with Neoma Laming on how to set this appointment up.   If you have any questions or concerns, please feel free to call the clinic.   Be well,  Dr. Avon Gully

## 2018-02-22 NOTE — Assessment & Plan Note (Signed)
Clear stressor (current job). Has already taken steps towards removing herself from this environment. Has two other job options if she chooses to leave current occupation. Discussed that medication is not appropriate for this situation, and patient voicing that she does not want medication anyway. Casimer Lanius met with patient today (see her note) and discussed diaphragmatic breathing. She plans to schedule a follow up appt with Neoma Laming.

## 2018-02-22 NOTE — Progress Notes (Signed)
   Subjective:   Patient: [name]       Birthdate: 04-29-1956       MRN: [id number]      HPI  [name] is a 62 y.o. female presenting for stress.   Stress Patient says she is very anxious about her current life situation. 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 The congregation does not treat her well. She has contacted the governing body of her religious organization and asked to be moved to a different church. She serves one year positions at churches she is appointed to, and has been at [employer] since July 2018. She is due to leave current church in June 2019 (next month). She has not preached for the past two Sundays. She knows that leaving the toxic environment of the church would help her situation. She was formerly a Clinical biochemist at [employer] and says she could return to that if she wanted to. 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 She is not entirely sure what she is wanting out of today's appointment, but is interested in behavioral health counseling.   Smoking status reviewed. Patient is never smoker.   Review of Systems See HPI.     Objective:  Physical Exam  Constitutional: She is oriented to person, place, and time. She appears well-developed and well-nourished. No distress.  HENT:  Head: Normocephalic and atraumatic.  Pulmonary/Chest: Effort normal. No respiratory distress.  Neurological: She is alert and oriented to person, place, and time.  Psychiatric: She has a normal mood and affect. Her behavior is normal.  Very calm, not anxious appearing throughout encounter   Assessment & Plan:  Stress Clear stressor (current job). Has already taken steps towards removing herself from this environment. Has two other job options if she chooses to leave current occupation. Discussed that medication is not appropriate for this situation, and patient voicing that she does not want  medication anyway. Casimer Lanius met with patient today (see her note) and discussed diaphragmatic breathing. She plans to schedule a follow up appt with Neoma Laming.    Adin Hector, MD, MPH PGY-3 [facility] Pager [phone]

## 2018-03-02 DIAGNOSIS — M67911 Unspecified disorder of synovium and tendon, right shoulder: Secondary | ICD-10-CM | POA: Diagnosis not present

## 2018-03-02 DIAGNOSIS — M25552 Pain in left hip: Secondary | ICD-10-CM | POA: Diagnosis not present

## 2018-03-02 DIAGNOSIS — M67912 Unspecified disorder of synovium and tendon, left shoulder: Secondary | ICD-10-CM | POA: Diagnosis not present

## 2018-05-08 DIAGNOSIS — M25512 Pain in left shoulder: Secondary | ICD-10-CM | POA: Diagnosis not present

## 2018-05-16 DIAGNOSIS — M8580 Other specified disorders of bone density and structure, unspecified site: Secondary | ICD-10-CM

## 2018-05-16 HISTORY — DX: Other specified disorders of bone density and structure, unspecified site: M85.80

## 2018-05-23 ENCOUNTER — Ambulatory Visit
Admission: RE | Admit: 2018-05-23 | Discharge: 2018-05-23 | Disposition: A | Discharge status: Home / Self Care | Payer: Medicare HMO | Source: Ambulatory Visit | Attending: Family Medicine | Admitting: Family Medicine

## 2018-05-23 ENCOUNTER — Other Ambulatory Visit: Payer: Self-pay

## 2018-05-23 ENCOUNTER — Ambulatory Visit (INDEPENDENT_AMBULATORY_CARE_PROVIDER_SITE_OTHER): Payer: Medicare HMO | Admitting: Family Medicine

## 2018-05-23 ENCOUNTER — Encounter: Payer: Self-pay | Admitting: Family Medicine

## 2018-05-23 VITALS — BP 134/74 | HR 84 | Temp 98.1°F | Wt 214.0 lb

## 2018-05-23 DIAGNOSIS — L299 Pruritus, unspecified: Secondary | ICD-10-CM

## 2018-05-23 DIAGNOSIS — M542 Cervicalgia: Secondary | ICD-10-CM | POA: Diagnosis not present

## 2018-05-23 DIAGNOSIS — M792 Neuralgia and neuritis, unspecified: Secondary | ICD-10-CM

## 2018-05-23 DIAGNOSIS — Z1159 Encounter for screening for other viral diseases: Secondary | ICD-10-CM | POA: Diagnosis not present

## 2018-05-23 MED ORDER — CAPSAICIN 0.025 % EX CREA: TOPICAL_CREAM | Freq: Two times a day (BID) | CUTANEOUS | 0 refills | Status: AC

## 2018-05-23 NOTE — Progress Notes (Signed)
    Subjective:    Patient ID: [name], female    DOB: 12-Feb-1956, 62 y.o.   MRN: [id number]   CC: skin burning/irritation  HPI: Patient reports 9 month history of skin burning and irritation located down neck into left shoulder/upper arm and left scapula. She initially thought she was getting shingles back in November but was seen and had no lesions. She had shingles when she was about 62 years old. She was told at that time to use topical anti-itch cream. She has not tried this. The burning and itching has persisted to now. She is frustrated by this and has a difficult time sleeping due to the itching. She tried a small dose of benadryl last night with some relief. Denies rashes or lesions anywhere. She thinks sensation is related to stress.  Smoking status reviewed- non-smoker  Review of Systems- see HPI   Objective:  BP 134/74   Pulse 84   Temp 98.1 F (36.7 C) (Oral)   Wt 214 lb (97.1 kg)   BMI 39.14 kg/m  Vitals and nursing note reviewed  General: well nourished, in no acute distress HEENT: normocephalic, MMM Neck: supple, non-tender. Limited ROM in flexion and extension. Cardiac: RRR, clear S1 and S2, no murmurs, rubs, or gallops Respiratory: clear to auscultation bilaterally, no increased work of breathing Skin: warm and dry, no rashes noted. No evidence of excoriation.  Neuro: alert and oriented, no focal deficits   Assessment & Plan:   1. Radicular pain in left arm Burning nerve pain described by patient sounds typical for radiculopathy. Will check cervical spine x-ray. Advised capsaicin cream for nerve pain over neck/shoulder/shoulder blade. Follow up with PCP in 2 weeks. - DG Cervical Spine Complete; Future  2. Pruritus, unspecified From review patient appears to have elevated calcium in past. Additionally pruritic symptom could be related to biliary dysfunction. Will check CMP.  - CMP14+EGFR  3. Screening for viral disease Due for hep c screening, will  obtain along with other bloodwork. - Hepatitis C antibody   Precepted with Dr. Owens Shark.  Return in about 2 weeks (around 06/06/2018).   Lucila Maine, DO Family Medicine Resident PGY-2

## 2018-05-23 NOTE — Patient Instructions (Addendum)
   Good to see you today!  I will call you with your lab results. Please get your neck x-ray at your convenience. Try the capsaicin cream on the irritated areas to see if this helps with burning nerve pain.  Please follow up with your PCP after labs are back to discuss next steps.  If you have questions or concerns please do not hesitate to call at [phone].  Lucila Maine, DO PGY-2, [facility] 05/23/2018 2:47 PM

## 2018-05-24 ENCOUNTER — Telehealth: Payer: Self-pay

## 2018-05-24 ENCOUNTER — Encounter: Payer: Self-pay | Admitting: Family Medicine

## 2018-05-24 LAB — CMP14+EGFR
ALBUMIN: 4.2 g/dL (ref 3.6–4.8)
ALT: 19 IU/L (ref 0–32)
AST: 13 IU/L (ref 0–40)
Albumin/Globulin Ratio: 1.5 (ref 1.2–2.2)
Alkaline Phosphatase: 114 IU/L (ref 39–117)
BUN / CREAT RATIO: 11 — AB (ref 12–28)
BUN: 8 mg/dL (ref 8–27)
Bilirubin Total: 0.2 mg/dL (ref 0.0–1.2)
CALCIUM: 10.8 mg/dL — AB (ref 8.7–10.3)
CO2: 25 mmol/L (ref 20–29)
CREATININE: 0.74 mg/dL (ref 0.57–1.00)
Chloride: 105 mmol/L (ref 96–106)
GFR calc Af Amer: 101 mL/min/{1.73_m2} (ref >59)
GFR, EST NON AFRICAN AMERICAN: 88 mL/min/{1.73_m2} (ref >59)
GLUCOSE: 99 mg/dL (ref 65–99)
Globulin, Total: 2.8 g/dL (ref 1.5–4.5)
Potassium: 4.4 mmol/L (ref 3.5–5.2)
Sodium: 142 mmol/L (ref 134–144)
TOTAL PROTEIN: 7 g/dL (ref 6.0–8.5)

## 2018-05-24 LAB — HEPATITIS C ANTIBODY: Hep C Virus Ab: 0.1 s/co ratio (ref 0.0–0.9)

## 2018-05-24 NOTE — Telephone Encounter (Signed)
Denied per patient's insurance as it is an OTC product and therefore not covered. Note to prescriber. Pharmacy notified. Danley Danker, RN [facility] RN)

## 2018-05-24 NOTE — Telephone Encounter (Signed)
Received fax from [place] requesting prior authorization of Capsaicin cream. Clinical information submitted via CoverMyMeds. Status pending. Will recheck status next business day.  Key # Key: [id number]  Danley Danker, RN [facility] RN)

## 2018-05-24 NOTE — Telephone Encounter (Signed)
Ok, thanks, I had told patient she may be able to just buy it over the counter.

## 2018-05-27 ENCOUNTER — Other Ambulatory Visit: Payer: Medicare HMO

## 2018-05-27 ENCOUNTER — Telehealth: Payer: Self-pay | Admitting: Family Medicine

## 2018-05-27 NOTE — Telephone Encounter (Signed)
Attempted to call Ms. Greenstein, left voicemail on cell number for her to call back. Neck x-ray looks okay and does not explain symptoms. Labwork was largely normal, her calcium is mildly elevated but she is on HCTZ which could explain this. Going to add another lab on (PTH and TSH) which she can come in for as a lab visit to get done. Also wanted to see if the capsaicin cream was helpful for her. If she calls back please relay that to her. She should follow up with her PCP. Next step could include doing an EMG (nerve study) to see if that is where the sensation/pain is coming from. Will forward to PCP.   Lucila Maine, DO PGY-3, [facility] 05/27/2018 9:33 AM

## 2018-05-27 NOTE — Telephone Encounter (Signed)
Received prior authorization request stating that capsaicin cream is not covered by insurance.  As this is an over-the-counter medication patient may obtain prescription over-the-counter.  Please inform patient that she may obtain cream over-the-counter.  Dalphine Handing, PGY-2 [facility] 05/27/2018 8:42 AM

## 2018-05-27 NOTE — Telephone Encounter (Signed)
Pt informed already. [name] Kennon Holter, CMA

## 2018-05-27 NOTE — Telephone Encounter (Signed)
Spoke with patient and relayed message. Appt made with PCP for next week. Stated Capsaicin cream was mildly helpful. Patient will come in for additional lab work this week.  Danley Danker, RN [facility] RN)

## 2018-05-28 ENCOUNTER — Encounter: Payer: Self-pay | Admitting: Obstetrics & Gynecology

## 2018-05-28 ENCOUNTER — Ambulatory Visit: Payer: Medicare HMO | Admitting: Obstetrics & Gynecology

## 2018-05-28 VITALS — BP 128/86 | Ht 61.5 in | Wt 214.0 lb

## 2018-05-28 DIAGNOSIS — Z6839 Body mass index (BMI) 39.0-39.9, adult: Secondary | ICD-10-CM | POA: Diagnosis not present

## 2018-05-28 DIAGNOSIS — Z13820 Encounter for screening for osteoporosis: Secondary | ICD-10-CM

## 2018-05-28 DIAGNOSIS — Z124 Encounter for screening for malignant neoplasm of cervix: Secondary | ICD-10-CM | POA: Diagnosis not present

## 2018-05-28 DIAGNOSIS — E6609 Other obesity due to excess calories: Secondary | ICD-10-CM

## 2018-05-28 DIAGNOSIS — Z01419 Encounter for gynecological examination (general) (routine) without abnormal findings: Secondary | ICD-10-CM | POA: Diagnosis not present

## 2018-05-28 DIAGNOSIS — Z01411 Encounter for gynecological examination (general) (routine) with abnormal findings: Secondary | ICD-10-CM

## 2018-05-28 DIAGNOSIS — Z780 Asymptomatic menopausal state: Secondary | ICD-10-CM | POA: Diagnosis not present

## 2018-05-28 LAB — PARATHYROID HORMONE, INTACT (NO CA): PTH: 92 pg/mL — AB (ref 15–65)

## 2018-05-28 LAB — TSH: TSH: 0.317 u[IU]/mL — AB (ref 0.450–4.500)

## 2018-05-28 NOTE — Patient Instructions (Signed)
1. Encounter for routine gynecological examination with Papanicolaou smear of cervix Normal gynecologic exam.  Pap reflex done.  Breast exam normal.  Screening mammogram negative in November 2018.  Colonoscopy done in 2015.  Health labs with family physician.  2. Menopause present Mildly symptomatic on no hormone replacement therapy.  Vasomotor symptoms appear tolerable, but could increase soy products in her nutrition or consider a supplement of black cohosh as needed.  3. Screening for osteoporosis Vitamin D supplements and regular weightbearing physical activity recommended.  Per patient her calcium was increased probably secondary to hydrochlorothiazide.  Will reassess with family physician. - DG Bone Density; Future  4. Class 2 obesity due to excess calories without serious comorbidity with body mass index (BMI) of 39.0 to 39.9 in adult Recommend a low calorie/carb diet such as [employer].  I aerobic activities 5 times a week and weightlifting every 2 days also recommended.  5. Screening for malignant neoplasm of cervix - Pap IG w/ reflex to HPV when ASC-U  [name], it was a pleasure seeing you today!  I will inform you of your results as soon as they are available.

## 2018-05-28 NOTE — Progress Notes (Signed)
[name] 09-19-1956 [id number]   History:    62 y.o. [id number]  Married.  Caring for her husband post strokes.  RP:  Established patient presenting for annual gyn exam   HPI: Menopause, well with no hormone replacement therapy except for mild hot flashes and night sweats.  No postmenopausal bleeding since evaluation in March and April 2019.  At that time an endometrial biopsy was done and came back benign with a benign endometrial polyp.  A sonohysterogram was performed which was negative.  No pelvic pain.  Mild abdominal cramping on-off.  Sexually active but no penetration.  Urine and bowel movements normal.  Breasts normal.  Body mass index 39.78.  Health labs with family physician.  Past medical history,surgical history, family history and social history were all reviewed and documented in the EPIC chart.  Gynecologic History No LMP recorded. Patient is postmenopausal. Contraception: post menopausal status Last Pap: 04/2015. Results were: Negative/HPV HR neg Last mammogram: 08/2017. Results were: Negative Bone Density: Never Colonoscopy: 09/2014  Obstetric History OB History  Gravida Para Term Preterm AB Living  4 3     1 3   SAB TAB Ectopic Multiple Live Births  1            # Outcome Date GA Lbr Len/2nd Weight Sex Delivery Anes PTL Lv  4 SAB           3 Para           2 Para           1 Para              ROS: A ROS was performed and pertinent positives and negatives are included in the history.  GENERAL: No fevers or chills. HEENT: No change in vision, no earache, sore throat or sinus congestion. NECK: No pain or stiffness. CARDIOVASCULAR: No chest pain or pressure. No palpitations. PULMONARY: No shortness of breath, cough or wheeze. GASTROINTESTINAL: No abdominal pain, nausea, vomiting or diarrhea, melena or bright red blood per rectum. GENITOURINARY: No urinary frequency, urgency, hesitancy or dysuria. MUSCULOSKELETAL: No joint or muscle pain, no back pain, no recent  trauma. DERMATOLOGIC: No rash, no itching, no lesions. ENDOCRINE: No polyuria, polydipsia, no heat or cold intolerance. No recent change in weight. HEMATOLOGICAL: No anemia or easy bruising or bleeding. NEUROLOGIC: No headache, seizures, numbness, tingling or weakness. PSYCHIATRIC: No depression, no loss of interest in normal activity or change in sleep pattern.     Exam:   BP 128/86   Ht 5' 1.5" (1.562 m)   Wt 214 lb (97.1 kg)   BMI 39.78 kg/m   Body mass index is 39.78 kg/m.  General appearance : Well developed well nourished female. No acute distress HEENT: Eyes: no retinal hemorrhage or exudates,  Neck supple, trachea midline, no carotid bruits, no thyroidmegaly Lungs: Clear to auscultation, no rhonchi or wheezes, or rib retractions  Heart: Regular rate and rhythm, no murmurs or gallops Breast:Examined in sitting and supine position were symmetrical in appearance, no palpable masses or tenderness,  no skin retraction, no nipple inversion, no nipple discharge, no skin discoloration, no axillary or supraclavicular lymphadenopathy Abdomen: no palpable masses or tenderness, no rebound or guarding Extremities: no edema or skin discoloration or tenderness  Pelvic: Vulva: Normal             Vagina: No gross lesions or discharge  Cervix: No gross lesions or discharge.  Pap reflex done.  Uterus  AV, normal size, shape  and consistency, non-tender and mobile  Adnexa  Without masses or tenderness  Anus: Normal   Assessment/Plan:  62 y.o. female for annual exam   1. Encounter for routine gynecological examination with Papanicolaou smear of cervix Normal gynecologic exam.  Pap reflex done.  Breast exam normal.  Screening mammogram negative in November 2018.  Colonoscopy done in 2015.  Health labs with family physician.  2. Menopause present Mildly symptomatic on no hormone replacement therapy.  Vasomotor symptoms appear tolerable, but could increase soy products in her nutrition or  consider a supplement of black cohosh as needed.  3. Screening for osteoporosis Vitamin D supplements and regular weightbearing physical activity recommended.  Per patient her calcium was increased probably secondary to hydrochlorothiazide.  Will reassess with family physician. - DG Bone Density; Future  4. Class 2 obesity due to excess calories without serious comorbidity with body mass index (BMI) of 39.0 to 39.9 in adult Recommend a low calorie/carb diet such as [employer].  I aerobic activities 5 times a week and weightlifting every 2 days also recommended.  5. Screening for malignant neoplasm of cervix - Pap IG w/ reflex to HPV when ASC-U   Princess Bruins MD, 10:33 AM 05/28/2018

## 2018-05-30 LAB — PAP IG W/ RFLX HPV ASCU

## 2018-05-31 ENCOUNTER — Encounter: Payer: Self-pay | Admitting: *Deleted

## 2018-05-31 ENCOUNTER — Telehealth: Payer: Self-pay

## 2018-05-31 NOTE — Telephone Encounter (Signed)
Patient left message she is returning call. No message seen but patient has recent lab work that she may not have been informed of.  Call back is [phone]  Danley Danker, RN [facility])

## 2018-06-03 ENCOUNTER — Other Ambulatory Visit: Payer: Self-pay | Admitting: Gynecology

## 2018-06-03 DIAGNOSIS — Z13820 Encounter for screening for osteoporosis: Secondary | ICD-10-CM

## 2018-06-04 NOTE — Progress Notes (Deleted)
   Subjective:    Patient ID: [name], female    DOB: 19-May-1956, 62 y.o.   MRN: [id number]   CC: f/u neck pain   HPI: Neck pain Patient was seen by Dr. Vanetta Shawl on 8/8 and plain films on neck were obtained showing normal cervical spine. Dr. Vanetta Shawl also obtained PTH and TSH labs which showed elevated PTH and low TSH. Per last note from Dr. Vanetta Shawl, patient may need possible EMG.   Smoking status reviewed  Review of Systems   Objective:  There were no vitals taken for this visit. Vitals and nursing note reviewed  General: well nourished, in no acute distress HEENT: normocephalic, TM's visualized bilaterally, no scleral icterus or conjunctival pallor, no nasal discharge, moist mucous membranes, good dentition without erythema or discharge noted in posterior oropharynx Neck: supple, non-tender, without lymphadenopathy Cardiac: RRR, clear S1 and S2, no murmurs, rubs, or gallops Respiratory: clear to auscultation bilaterally, no increased work of breathing Abdomen: soft, nontender, nondistended, no masses or organomegaly. Bowel sounds present Extremities: no edema or cyanosis. Warm, well perfused. 2+ radial and PT pulses bilaterally Skin: warm and dry, no rashes noted Neuro: alert and oriented, no focal deficits   Assessment & Plan:    No problem-specific Assessment & Plan notes found for this encounter.    No follow-ups on file.   Caroline More, DO, PGY-2

## 2018-06-05 ENCOUNTER — Ambulatory Visit: Payer: Medicare HMO | Admitting: Family Medicine

## 2018-06-11 ENCOUNTER — Encounter: Payer: Self-pay | Admitting: Gynecology

## 2018-06-11 ENCOUNTER — Other Ambulatory Visit: Payer: Self-pay | Admitting: Gynecology

## 2018-06-11 ENCOUNTER — Ambulatory Visit (INDEPENDENT_AMBULATORY_CARE_PROVIDER_SITE_OTHER): Payer: Medicare HMO

## 2018-06-11 DIAGNOSIS — Z780 Asymptomatic menopausal state: Secondary | ICD-10-CM | POA: Diagnosis not present

## 2018-06-11 DIAGNOSIS — M85832 Other specified disorders of bone density and structure, left forearm: Secondary | ICD-10-CM

## 2018-06-11 DIAGNOSIS — Z13820 Encounter for screening for osteoporosis: Secondary | ICD-10-CM

## 2018-06-12 NOTE — Telephone Encounter (Signed)
Called Ms. Zulueta back. Her husband had a stroke and is very busy with appointments and she's overwhelmed. She reports the same problems with her skin sensation. Her appointment with PCP is now in mid-October. Will reschedule her to come in sooner to discuss labs and follow up.  Lucila Maine, DO PGY-3, [facility] 06/12/2018 11:18 AM

## 2018-06-19 ENCOUNTER — Ambulatory Visit (INDEPENDENT_AMBULATORY_CARE_PROVIDER_SITE_OTHER): Payer: Medicare HMO | Admitting: Family Medicine

## 2018-06-19 ENCOUNTER — Other Ambulatory Visit: Payer: Self-pay | Admitting: Family Medicine

## 2018-06-19 ENCOUNTER — Encounter: Payer: Self-pay | Admitting: Family Medicine

## 2018-06-19 ENCOUNTER — Other Ambulatory Visit: Payer: Self-pay

## 2018-06-19 VITALS — BP 116/80 | HR 80 | Temp 98.0°F | Wt 231.0 lb

## 2018-06-19 DIAGNOSIS — M5030 Other cervical disc degeneration, unspecified cervical region: Secondary | ICD-10-CM | POA: Insufficient documentation

## 2018-06-19 DIAGNOSIS — R7989 Other specified abnormal findings of blood chemistry: Secondary | ICD-10-CM

## 2018-06-19 DIAGNOSIS — M792 Neuralgia and neuritis, unspecified: Secondary | ICD-10-CM | POA: Diagnosis not present

## 2018-06-19 HISTORY — DX: Other specified abnormal findings of blood chemistry: R79.89

## 2018-06-19 MED ORDER — DULOXETINE HCL 20 MG PO CPEP: 20.0000 mg | ORAL_CAPSULE | Freq: Every day | ORAL | 1 refills | Status: DC

## 2018-06-19 NOTE — Assessment & Plan Note (Signed)
  Evidence of anterior osteophytes on cervical spine x-ray, will obtain MRI to r/o nerve impingement causing pain

## 2018-06-19 NOTE — Progress Notes (Signed)
    Subjective:    Patient ID: [name], female    DOB: 1956-10-10, 62 y.o.   MRN: [id number]  CC: neck pain  HPI: coming in to follow up on her neck pain. Initially when she was seen last time her pain was more left sided, however now she feels it in her right shoulder as well as some pain radiating into her upper arms. She describes the pain as burning and occasionally has "a jolt of pain" down her arm. She has used capsaicin cream with some relief. 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 She feels overwhelmed and that her pain is likely related to the stress she feels. She notices it has gotten worse since she has started caring for him. She denies neck pain, upper extremity weakness, loss of sensation in her arms  She has been seen in the past for thyroid issues but her endocrinologist stated there was nothing to be done as her "levels were not abnormal enough". She has a family history of goiter in her mother and hypothyroidism in her daughter.   Smoking status reviewed- non-smoker  Review of Systems-  See HPI   Objective:  BP 116/80   Pulse 80   Temp 98 F (36.7 C) (Oral)   Wt 231 lb (104.8 kg)   SpO2 96%   BMI 42.94 kg/m  Vitals and nursing note reviewed  General: well nourished, in no acute distress HEENT: normocephalic, MMM Neck: supple, non-tender, without lymphadenopathy. Mildly enlarged thyroid, non-tender. Cardiac: RRR, clear S1 and S2, no murmurs, rubs, or gallops Respiratory: clear to auscultation bilaterally, no increased work of breathing Extremities: no edema or cyanosis. Warm, well perfused. 2+ radial pulses bilaterally Skin: warm and dry, no rashes noted Neuro: alert and oriented, no focal deficits. Sensation in tact bilaterally. Strength 5/5 in upper extremities bilaterally. Symmetrical reflexes at C5, C6 bilaterally    Assessment & Plan:    Neuropathic pain  Pain in bilateral shoulders  more consistent with nerve pain. Will obtain MRI of neck to assess for nerve impingement. Will try adding cymbalta for nerve pain given patient also has history of depression and has a lot of stress in her life currently. Asked patient to let me know how she is doing on this before her PCP appointment, would consider trying gabapentin next. Could also consider EMG in the future. The patient indicates understanding of these issues and agrees with the plan.   Other cervical disc degeneration, unspecified cervical region  Evidence of anterior osteophytes on cervical spine x-ray, will obtain MRI to r/o nerve impingement causing pain  Elevated PTHrP level  Patient with mildly elevated calcium and PTH levels. She is asymptomatic. Recent DEXA demonstrated osteopenia. Will plan to recheck these labs in 3 months to monitor.    Return in about 6 weeks (around 07/31/2018).   Lucila Maine, DO Family Medicine Resident PGY-3

## 2018-06-19 NOTE — Assessment & Plan Note (Signed)
  Pain in bilateral shoulders more consistent with nerve pain. Will obtain MRI of neck to assess for nerve impingement. Will try adding cymbalta for nerve pain given patient also has history of depression and has a lot of stress in her life currently. Asked patient to let me know how she is doing on this before her PCP appointment, would consider trying gabapentin next. Could also consider EMG in the future. The patient indicates understanding of these issues and agrees with the plan.

## 2018-06-19 NOTE — Assessment & Plan Note (Signed)
  Patient with mildly elevated calcium and PTH levels. She is asymptomatic. Recent DEXA demonstrated osteopenia. Will plan to recheck these labs in 3 months to monitor.

## 2018-06-19 NOTE — Patient Instructions (Signed)
  Good to see you today!  Please start taking the cymbalta every night for nerve pain and mood. If you have no improvement after a month, let me know and we can try a different nerve pain medication.  We'll get the MRI of your neck and go from there.  Please follow up with your PCP as scheduled on 08/02/18.  If you have questions or concerns please do not hesitate to call at [phone].  Lucila Maine, DO PGY-3, [facility] 06/19/2018 11:43 AM

## 2018-06-23 ENCOUNTER — Ambulatory Visit
Admission: RE | Admit: 2018-06-23 | Discharge: 2018-06-23 | Disposition: A | Discharge status: Home / Self Care | Payer: Medicare HMO | Source: Ambulatory Visit | Attending: Family Medicine | Admitting: Family Medicine

## 2018-06-23 DIAGNOSIS — M47812 Spondylosis without myelopathy or radiculopathy, cervical region: Secondary | ICD-10-CM | POA: Diagnosis not present

## 2018-06-23 DIAGNOSIS — M5030 Other cervical disc degeneration, unspecified cervical region: Secondary | ICD-10-CM

## 2018-06-24 NOTE — Progress Notes (Signed)
  Called [name] to discuss MRI results. 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 She states she is not taking the cymbalta. She does not like to take medications, she feels overly sensitive to them. She is tolerating the pain with using just topical muscle rub. Will not prescribe gabapentin at this time. She will work on stress reduction. Discussed possibly trying PT, chiropractics, formal behavioral health referral in the future if things persist. She is to follow up as scheduled with PCP mid-October. All questions encouraged and answered.  Lucila Maine, DO PGY-3, [facility] 06/24/2018 2:52 PM

## 2018-07-16 DIAGNOSIS — H524 Presbyopia: Secondary | ICD-10-CM | POA: Diagnosis not present

## 2018-07-16 DIAGNOSIS — H52209 Unspecified astigmatism, unspecified eye: Secondary | ICD-10-CM | POA: Diagnosis not present

## 2018-07-16 DIAGNOSIS — H5203 Hypermetropia, bilateral: Secondary | ICD-10-CM | POA: Diagnosis not present

## 2018-08-01 NOTE — Progress Notes (Signed)
Subjective:    Patient ID: [name], female    DOB: 1956/02/07, 62 y.o.   MRN: [id number]   CC: follow up neck pain  HPI: Neck pain Patient presenting today for follow-up on neck pain.  Patient has been seen twice before by Dr. Vanetta Shawl for this issue in August and September.  She was last seen by Dr. Vanetta Shawl on 06/19/18. At that time MRI was obtained and patient was started on cymbalta. MRI showed spondylosis without stenosis.  Lab work performed by Dr. Vanetta Shawl as well showed mildly elevated calcium and parathyroid hormone levels.  DEXA scan demonstrated osteopenia.  Plan at that time was to recheck labs in 3 months.  Patient was informed by Dr. Vanetta Shawl to continue using Cymbalta and topical muscle rubs.  Patient states that neck pain has not improved.  Patient states pain is a burning sensation such as when she had shingles.  States that it goes down her left arm as well.  Patient tried Cymbalta but this gave her severe headache so she discontinue this medication.  She is very sensitive to medications.  Patient has been using ibuprofen but states that this makes her slightly drowsy and does not want to rely on it.  Patient is able to do activities of daily living but with pain.  Patient states her stress may cause some increased pain.  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  She does not do any heavy lifting, pulling, pushing but this does cause her stress.    Depression Patient states she may have some component of depression but does not want to discuss it with me at this time.  States that caring for someone that has a history of 2 strokes is wearing on her somewhat.  States that if she wants to talk about this in the future she knows she can make an appointment with me.   Objective:  BP 124/84 (BP Location: Left Arm, Patient Position: Sitting, Cuff Size: Large)   Pulse 75   Temp 98.5 F (36.9 C) (Oral)   Resp 18   Ht 5\' 2"  (1.575 m)   Wt 218 lb (98.9 kg)   SpO2  98%   BMI 39.87 kg/m  Vitals and nursing note reviewed  General: well nourished, in no acute distress HEENT: normocephalic, no scleral icterus or conjunctival pallor, moist mucous membranes  Neck: supple, non-tender, without lymphadenopathy, full range of motion, thyromegaly. Cardiac: RRR, clear S1 and S2, no murmurs, rubs, or gallops Respiratory: clear to auscultation bilaterally, no increased work of breathing Abdomen: soft, nontender, nondistended, no masses or organomegaly. Bowel sounds present Extremities: no edema or cyanosis. Warm, well perfused.  Limited flexion of upper extremity secondary to pain.  This is limited in both passive and active range of motion secondary to pain.  Otherwise full range of motion in upper extremities. Skin: warm and dry, no rashes noted Neuro: alert and oriented, no focal deficits  Depression screen [facility] 2/9 08/02/2018 08/02/2018 06/19/2018 05/23/2018 02/22/2018  Decreased Interest 0 0 0 0 0  Down, Depressed, Hopeless 1 0 0 0 0  PHQ - 2 Score 1 0 0 0 0  Altered sleeping 1 - - - -  Tired, decreased energy 1 - - - -  Change in appetite 0 - - - -  Feeling bad or failure about yourself  0 - - - -  Trouble concentrating 0 - - - -  Moving slowly or fidgety/restless 0 - - - -  Suicidal thoughts 0 - - - -  PHQ-9 Score 3 - - - -  Difficult doing work/chores Somewhat difficult - - - -     Assessment & Plan:    Neuropathic pain Patient with continued neck and shoulder pain likely secondary to neuropathic pain.  MRI did not show any nerve impingement.  Will give trial of gabapentin 100 mg daily as well as amatory referral to physical therapy.  Discussed possible follow-up with behavioral health versus both with PCP.  Discussed continuing conservative measures as well.  It is hopeful that with gabapentin her pain may be alleviated and she can be more active.  Patient does cycle on a stationary bike daily.  Patient would not like to discuss depression at this time  but states that if she feels she would like to talk about it she knows she could schedule an appointment with me.    Return in about 4 weeks (around 08/30/2018).   Caroline More, DO, PGY-2

## 2018-08-02 ENCOUNTER — Encounter: Payer: Self-pay | Admitting: Family Medicine

## 2018-08-02 ENCOUNTER — Ambulatory Visit (INDEPENDENT_AMBULATORY_CARE_PROVIDER_SITE_OTHER): Payer: Medicare HMO | Admitting: Family Medicine

## 2018-08-02 VITALS — BP 124/84 | HR 75 | Temp 98.5°F | Resp 18 | Ht 62.0 in | Wt 218.0 lb

## 2018-08-02 DIAGNOSIS — M542 Cervicalgia: Secondary | ICD-10-CM

## 2018-08-02 DIAGNOSIS — M792 Neuralgia and neuritis, unspecified: Secondary | ICD-10-CM | POA: Diagnosis not present

## 2018-08-02 DIAGNOSIS — Z23 Encounter for immunization: Secondary | ICD-10-CM | POA: Diagnosis not present

## 2018-08-02 MED ORDER — GABAPENTIN 100 MG PO CAPS: 100.0000 mg | ORAL_CAPSULE | Freq: Every day | ORAL | 1 refills | Status: DC

## 2018-08-02 NOTE — Assessment & Plan Note (Signed)
Patient with continued neck and shoulder pain likely secondary to neuropathic pain.  MRI did not show any nerve impingement.  Will give trial of gabapentin 100 mg daily as well as amatory referral to physical therapy.  Discussed possible follow-up with behavioral health versus both with PCP.  Discussed continuing conservative measures as well.  It is hopeful that with gabapentin her pain may be alleviated and she can be more active.  Patient does cycle on a stationary bike daily.  Patient would not like to discuss depression at this time but states that if she feels she would like to talk about it she knows she could schedule an appointment with me.

## 2018-08-02 NOTE — Patient Instructions (Signed)
It was a pleasure seeing you today.   Today we discussed your neck pain  For your pain: I will start you on a medication called gabapentin. Take this daily. If symptoms do not resolve let me know and we can try and increase medications and talk about a plan.   Please consider talking to behavioral health.   Please follow up in 1 month or sooner if symptoms persist or worsen. Please call the clinic immediately if you have any concerns.   Our clinic's number is [phone]. Please call with questions or concerns.   Thank you,  Caroline More, DO

## 2018-08-13 ENCOUNTER — Other Ambulatory Visit: Payer: Self-pay

## 2018-08-13 ENCOUNTER — Ambulatory Visit: Payer: Medicare HMO | Attending: Family Medicine | Admitting: Physical Therapy

## 2018-08-13 DIAGNOSIS — M25512 Pain in left shoulder: Secondary | ICD-10-CM | POA: Insufficient documentation

## 2018-08-13 DIAGNOSIS — M6281 Muscle weakness (generalized): Secondary | ICD-10-CM | POA: Diagnosis not present

## 2018-08-13 DIAGNOSIS — G8929 Other chronic pain: Secondary | ICD-10-CM | POA: Insufficient documentation

## 2018-08-13 DIAGNOSIS — R208 Other disturbances of skin sensation: Secondary | ICD-10-CM | POA: Diagnosis not present

## 2018-08-13 DIAGNOSIS — R293 Abnormal posture: Secondary | ICD-10-CM | POA: Diagnosis not present

## 2018-08-13 DIAGNOSIS — M542 Cervicalgia: Secondary | ICD-10-CM | POA: Insufficient documentation

## 2018-08-13 NOTE — Therapy (Signed)
[place], [place], [address] Phone: [phone]   Fax:  [phone]  Physical Therapy Evaluation  Patient Details  Name: VENIA RIVERON MRN: [id number] Date of Birth: [birth year] Referring Provider (PT): Caroline More, DO    Encounter Date: 08/13/2018  PT End of Session - 08/13/18 1302    Visit Number  1    Number of Visits  12    Date for PT Re-Evaluation  09/24/18    PT Start Time  4268    PT Stop Time  1245    PT Time Calculation (min)  60 min    Activity Tolerance  Patient tolerated treatment well    Behavior During Therapy  [facility] for tasks assessed/performed       Past Medical History:  Diagnosis Date  . Allergy   . Anxiety   . 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 Carpal tunnel syndrome, bilateral   . Chest pain   . Heart murmur    age 62 said had heart murmur.  . Hyperlipidemia   . Hypertension   . Normal cardiac stress test    Myoview stress test  . Osteopenia 05/2018   T score -1.1 FRAX 2.5%/ 0.1%    Past Surgical History:  Procedure Laterality Date  . COLONOSCOPY    . POLYPECTOMY    . 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 TOTAL HIP ARTHROPLASTY Left 10/01/2013   Procedure: TOTAL HIP ARTHROPLASTY;  Surgeon: Kerin Salen, MD;  Location: [facility];  Service: Orthopedics;  Laterality: Left;    There were no vitals filed for this visit.   Subjective Assessment - 08/13/18 1156    Subjective  Patient noticed increased pain in bilateral neck and shoulders.  L side worse than R.  She had take her ex husband into her home as he had a stroke in May.  She has been under alot a of stress surrounding.  She has to be supervised.  She has increased her houswork but does not have to do any lifing.  She complains of a weight on her neck, a heaviness.  Pain radiates into her lower arms.     Limitations  House hold activities;Lifting;Other (comment)   sleep   Diagnostic  tests  06/2018: cervical spondylosis without stenosis , multi level small disc C4-C7    Patient Stated Goals  pain relief and not take med     Currently in Pain?  Yes    Pain Score  6     Pain Location  Neck    Pain Orientation  Right;Left;Posterior    Pain Descriptors / Indicators  Burning;Other (Comment);Tiring;Tightness   itching    Pain Type  Chronic pain    Pain Radiating Towards  L scapula    Pain Onset  More than a month ago    Pain Frequency  Intermittent    Aggravating Factors   activity    Pain Relieving Factors  meds may have helped but she does not want to take, riding her bike          [facility] PT Assessment - 08/13/18 0001      Assessment   Medical Diagnosis  cervical spondylosis     Referring Provider (PT)  Caroline More, DO     Onset Date/Surgical Date  --   chronic with flare up in Summer 2019   Hand Dominance  Right    Next MD Visit  after PT    Prior Therapy  Yes for hip       Precautions   Precautions  None      Restrictions   Weight Bearing Restrictions  No      Balance Screen   Has the patient fallen in the past 6 months  No      [place] residence    Living Arrangements  Non-relatives/Friends    Type of [place] to enter    Entrance Stairs-Number of Steps  5    Entrance Stairs-Rails  Right      Prior Function   Level of Independence  Independent    Vocation  Self employed    Vocation Requirements  was a Clinical biochemist, does some day care work, 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 , no lifitng necessarily    Leisure  likes to bowl, ride bike       Cognition   Overall Cognitive Status  Within Functional Limits for tasks assessed      Observation/Other Assessments   Focus on Therapeutic Outcomes (FOTO)   61%      Sensation   Light Touch  Appears Intact    Additional Comments  fingers numb at times due to carpal tunnel       Coordination   Gross Motor Movements are Fluid and Coordinated  Not  tested      Posture/Postural Control   Posture/Postural Control  Postural limitations    Postural Limitations  Rounded Shoulders;Forward head      AROM   Right Shoulder Flexion  150 Degrees    Left Shoulder Flexion  115 Degrees   pain    Cervical Flexion  60    Cervical Extension  45    Cervical - Right Side Bend  45   pain L    Cervical - Left Side Bend  50    Cervical - Right Rotation  65   min pain L    Cervical - Left Rotation  70      PROM   Overall PROM Comments  pain L shoulder all planes       Strength   Right/Left Shoulder  --   poor effort due to pain avoidance    Right Shoulder Flexion  3+/5    Right Shoulder ABduction  3+/5    Left Shoulder Flexion  3/5    Left Shoulder ABduction  3/5      Palpation   Palpation comment  none tender with palpation to upper back, shoulders, min in neck, "good hurt" along suboccipitals      Special Tests   Other special tests  NT                 Objective measurements completed on examination: See above findings.      [facility] Adult PT Treatment/Exercise - 08/13/18 0001      Neck Exercises: Supine   Neck Retraction  10 reps    Other Supine Exercise  retraction x 10       Moist Heat Therapy   Number Minutes Moist Heat  15 Minutes    Moist Heat Location  Cervical      Electrical Stimulation   Electrical Stimulation Location  IFC     Electrical Stimulation Action  cervical     Electrical Stimulation Parameters  to tol (8.5)    Electrical Stimulation Goals  Pain  PT Education - 08/13/18 1239    Education Details  PT/POC, HEP, posture     Person(s) Educated  Patient    Methods  Explanation;Handout    Comprehension  Verbalized understanding;Returned demonstration;Verbal cues required          PT Long Term Goals - 08/13/18 1303      PT LONG TERM GOAL #1   Title  Pt will be I with HEP for UE strength, neck, core     Time  6    Period  Weeks    Status  New    Target Date  09/24/18       PT LONG TERM GOAL #2   Title  Pt will be able to increase bilateral UE strength to 4/5 in all planes for normal, functional mobility     Baseline  3/5 poor effort due to pain avoidance     Time  6    Period  Weeks    Status  New    Target Date  09/24/18      PT LONG TERM GOAL #3   Title  Pt will be able to report no difficulty rolling over in bed , sleeping due to neck pain    Time  6    Period  Weeks    Status  New    Target Date  09/24/18      PT LONG TERM GOAL #4   Title  Pt will be able to reach overhead without neck or shoulder pain     Time  6    Period  Weeks    Status  New    Target Date  09/24/18      PT LONG TERM GOAL #5   Title  Pt will be able to exercise, manage pain at home with self care strategies, positioning     Time  6    Period  Weeks    Status  New    Target Date  09/24/18             Plan - 08/13/18 1311    Clinical Impression Statement  Patient presents for mod complexity eval of fairly new onset of neck and shoulder pain.  MRI was fairly inconclusive, finding mild spondylosis.  She has pain across both shoulders, neck, poor posture and bilateral UE weakness.  She did have pain relief with manual traction, modalities. Showed her how to position her UEs to reduce stretch on nerves in sitting.  She has taken on a caregiver role and even though she is not physically assisting him she has a greater workload and mental stress which has likely contributed to her pain.     History and Personal Factors relevant to plan of care:  emotional stress, obesity, Total hip replacement 2014, HTN    Clinical Presentation  Evolving    Clinical Presentation due to:  progressive worsening and symtpoms in UEs     Clinical Decision Making  Moderate    Rehab Potential  Excellent    PT Frequency  2x / week    PT Duration  6 weeks    PT Treatment/Interventions  ADLs/Self Care Home Management;Cryotherapy;Ultrasound;Traction;Moist Heat;Electrical  Stimulation;Iontophoresis 4mg /ml Dexamethasone;Therapeutic exercise;Patient/family education;Manual techniques;Dry needling;Therapeutic activities;Functional mobility training;Neuromuscular re-education;Taping;Passive range of motion    PT Next Visit Plan  HEP, begin stabilization, manual, IFC/heat     PT Home Exercise Plan  chin tuck, posture, scapular retraction     Consulted and Agree with Plan of Care  Patient  Patient will benefit from skilled therapeutic intervention in order to improve the following deficits and impairments:  Decreased mobility, Increased muscle spasms, Impaired sensation, Obesity, Improper body mechanics, Decreased range of motion, Decreased strength, Increased fascial restricitons, Impaired flexibility, Impaired UE functional use, Postural dysfunction, Pain, Decreased activity tolerance  Visit Diagnosis: Cervicalgia  Muscle weakness (generalized)  Chronic left shoulder pain  Abnormal posture  Other disturbances of skin sensation     Problem List Patient Active Problem List   Diagnosis Date Noted  . Neuropathic pain 06/19/2018  . Other cervical disc degeneration, unspecified cervical region 06/19/2018  . Elevated PTHrP level 06/19/2018  . Stress 02/22/2018  . Postmenopausal bleeding 11/30/2017  . Keratosis pilaris 04/12/2016  . OAB (overactive bladder) 09/01/2015  . 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 Hip arthritis 10/01/2013  . UNSPECIFIED EPISODIC MOOD DISORDER 01/04/2010  . THYROMEGALY 12/30/2009  . Hyperlipidemia 11/09/2008  . Subclinical hyperthyroidism 05/08/2007  . HYPERTENSION, BENIGN ESSENTIAL 01/15/2007  . Obesity 12/13/2006    Alfredo Collymore 08/13/2018, 4:30 PM  [facility] [address] [place], [place], [address] Phone: [phone]   Fax:  [phone]  Name: HARLEIGH CIVELLO MRN: [id number] Date of Birth: April 17, 1956   Raeford Razor, PT 08/13/18 4:30 PM Phone:  [phone] Fax: [phone]

## 2018-08-13 NOTE — Patient Instructions (Signed)
Step 1  Neck Retraction reps: 10  sets: 2  hold: 5  daily: 2  weekly: 7 Setup  Begin sitting upright. Movement  Gently draw your chin in while keeping your eyes fixed on something in front of you. Relax and repeat. Tip  Make sure that you do not look down as you do this exercise, or bend your neck forward. Stop if you start to feel dizzy. You should not feel any strain or pain. Step 1  Step 2  Supine Cervical Retraction with Towel reps: 10  sets: 2  hold: 5  daily: 1  weekly: 7 Setup  Begin lying on your back with your neck relaxed. Movement  Gently tuck your chin directly backward as if you are making a double chin. Hold, then relax and repeat. Tip  Make sure not to lift your head from the ground. Step 1  Step 2  Supine Scapular Retraction reps: 10  sets: 2  hold: 5  daily: 1  weekly: 7 Setup  Begin lying on your back. Movement  Gently press your shoulder blades down and back into the mat, squeezing your shoulder blades together. You may feel a stretch in the front of your shoulders. Tip  Make sure to keep your neck relaxed and do not shrug your shoulders during the exercise.  TENS UNIT  This is helpful for muscle pain and spasm.   Search and Purchase a TENS 7000 2nd edition at www.tenspros.com or www.amazon.com  (It should be less than $30)     TENS unit instructions:   Do not shower or bathe with the unit on  Turn the unit off before removing electrodes or batteries  If the electrodes lose stickiness add a drop of water to the electrodes after they are disconnected from the unit and place on plastic sheet. If you continued to have difficulty, call the TENS unit company to purchase more electrodes.  Do not apply lotion on the skin area prior to use. Make sure the skin is clean and dry as this will help prolong the life of the electrodes.  After use, always check skin for unusual red areas, rash or other skin difficulties. If there are any skin problems, does  not apply electrodes to the same area.  Never remove the electrodes from the unit by pulling the wires.  Do not use the TENS unit or electrodes other than as directed.  Do not change electrode placement without consulting your therapist or physician.  Keep 2 fingers with between each electrode.

## 2018-08-13 NOTE — Addendum Note (Signed)
Addended by: Raeford Razor L on: 08/13/2018 04:33 PM   Modules accepted: Orders

## 2018-08-14 ENCOUNTER — Ambulatory Visit: Payer: Medicare HMO

## 2018-08-20 ENCOUNTER — Encounter: Payer: Self-pay | Admitting: Physical Therapy

## 2018-08-20 ENCOUNTER — Ambulatory Visit: Payer: Medicare HMO | Attending: Family Medicine | Admitting: Physical Therapy

## 2018-08-20 DIAGNOSIS — M6281 Muscle weakness (generalized): Secondary | ICD-10-CM | POA: Diagnosis not present

## 2018-08-20 DIAGNOSIS — M25512 Pain in left shoulder: Secondary | ICD-10-CM | POA: Diagnosis not present

## 2018-08-20 DIAGNOSIS — M542 Cervicalgia: Secondary | ICD-10-CM | POA: Diagnosis not present

## 2018-08-20 DIAGNOSIS — R208 Other disturbances of skin sensation: Secondary | ICD-10-CM | POA: Insufficient documentation

## 2018-08-20 DIAGNOSIS — G8929 Other chronic pain: Secondary | ICD-10-CM | POA: Diagnosis not present

## 2018-08-20 DIAGNOSIS — R293 Abnormal posture: Secondary | ICD-10-CM | POA: Diagnosis not present

## 2018-08-20 NOTE — Patient Instructions (Signed)

## 2018-08-20 NOTE — Therapy (Signed)
[name], [place], [address] Phone: [phone]   Fax:  [phone]  Physical Therapy Treatment  Patient Details  Name: [name] MRN: [id number] Date of Birth: 10-28-1955 Referring Provider (PT): Caroline More, DO    Encounter Date: 08/20/2018  PT End of Session - 08/20/18 0934    Visit Number  2    Number of Visits  12    Date for PT Re-Evaluation  09/24/18    PT Start Time  0802    PT Stop Time  0907    PT Time Calculation (min)  65 min    Activity Tolerance  Patient tolerated treatment well    Behavior During Therapy  [facility] for tasks assessed/performed       Past Medical History:  Diagnosis Date  . Allergy   . Anxiety   . 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 Carpal tunnel syndrome, bilateral   . Chest pain   . Heart murmur    age 54 said had heart murmur.  . Hyperlipidemia   . Hypertension   . Normal cardiac stress test    Myoview stress test  . Osteopenia 05/2018   T score -1.1 FRAX 2.5%/ 0.1%    Past Surgical History:  Procedure Laterality Date  . COLONOSCOPY    . POLYPECTOMY    . 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 TOTAL HIP ARTHROPLASTY Left 10/01/2013   Procedure: TOTAL HIP ARTHROPLASTY;  Surgeon: Kerin Salen, MD;  Location: [facility];  Service: Orthopedics;  Laterality: Left;    There were no vitals filed for this visit.  Subjective Assessment - 08/20/18 0818    Subjective  Patient bought a TENS and is using a gel for pain.  I felt good after last session.   Back is not burning today.    Currently in Pain?  Yes    Pain Score  5     Pain Location  Neck    Pain Orientation  Right;Left;Posterior    Pain Descriptors / Indicators  Sore;Dull;Throbbing    Pain Radiating Towards  l scapula into left mid arm    Pain Frequency  Constant   varies   Aggravating Factors   stress.  sitting longer with poor posture   Left ear aching    Pain Relieving  Factors  Heating pad,  E-stim , gel,                       [facility] Adult PT Treatment/Exercise - 08/20/18 0001      Neck Exercises: Seated   Neck Retraction  5 reps      Neck Exercises: Supine   Other Supine Exercise  cervical stabilization exercises from exercise drawer all practiced 5 x each cues needed.  Jaw pain increase noted by patient so did not add to HEP.        Moist Heat Therapy   Number Minutes Moist Heat  15 Minutes    Moist Heat Location  Cervical      Electrical Stimulation   Electrical Stimulation Location  IFC    Electrical Stimulation Action  cervical    Electrical Stimulation Parameters  to strong comfort    Electrical Stimulation Goals  Pain   concurrent with moist heat     Manual Therapy   Manual therapy comments  STW helpful.  trigger point release Upper trap,  followed by strumming/ stretch  to lengthen      Neck Exercises: Stretches   Upper Trapezius Stretch  2 reps    Levator Stretch  2 reps             PT Education - 08/20/18 0934    Education Details  self care    Person(s) Educated  Patient    Methods  Explanation;Demonstration;Verbal cues    Comprehension  Verbalized understanding;Returned demonstration          PT Long Term Goals - 08/13/18 1303      PT LONG TERM GOAL #1   Title  Pt will be I with HEP for UE strength, neck, core     Time  6    Period  Weeks    Status  New    Target Date  09/24/18      PT LONG TERM GOAL #2   Title  Pt will be able to increase bilateral UE strength to 4/5 in all planes for normal, functional mobility     Baseline  3/5 poor effort due to pain avoidance     Time  6    Period  Weeks    Status  New    Target Date  09/24/18      PT LONG TERM GOAL #3   Title  Pt will be able to report no difficulty rolling over in bed , sleeping due to neck pain    Time  6    Period  Weeks    Status  New    Target Date  09/24/18      PT LONG TERM GOAL #4   Title  Pt will be able to reach  overhead without neck or shoulder pain     Time  6    Period  Weeks    Status  New    Target Date  09/24/18      PT LONG TERM GOAL #5   Title  Pt will be able to exercise, manage pain at home with self care strategies, positioning     Time  6    Period  Weeks    Status  New    Target Date  09/24/18            Plan - 08/20/18 0935    Clinical Impression Statement  No pain at end of session.  Did not progress exercises due to increased jaw pain mentioned post exercise.      PT Next Visit Plan  HEP,  continue stabilization, manual, IFC/heat     PT Home Exercise Plan  chin tuck, posture, scapular retraction     Consulted and Agree with Plan of Care  Patient       Patient will benefit from skilled therapeutic intervention in order to improve the following deficits and impairments:     Visit Diagnosis: Cervicalgia  Muscle weakness (generalized)  Chronic left shoulder pain  Abnormal posture  Other disturbances of skin sensation     Problem List Patient Active Problem List   Diagnosis Date Noted  . Neuropathic pain 06/19/2018  . Other cervical disc degeneration, unspecified cervical region 06/19/2018  . Elevated PTHrP level 06/19/2018  . Stress 02/22/2018  . Postmenopausal bleeding 11/30/2017  . Keratosis pilaris 04/12/2016  . OAB (overactive bladder) 09/01/2015  . 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 Hip arthritis 10/01/2013  . UNSPECIFIED EPISODIC MOOD DISORDER 01/04/2010  . THYROMEGALY 12/30/2009  . Hyperlipidemia 11/09/2008  . Subclinical hyperthyroidism 05/08/2007  . HYPERTENSION, BENIGN ESSENTIAL 01/15/2007  .  Obesity 12/13/2006    , PTA 08/20/2018, 9:40 AM  [facility] [address] [place], [place], [address] Phone: [phone]   Fax:  [phone]  Name: [name] MRN: [id number] Date of Birth: 1956/01/02

## 2018-08-22 ENCOUNTER — Encounter: Payer: Self-pay | Admitting: Physical Therapy

## 2018-08-22 ENCOUNTER — Ambulatory Visit: Payer: Medicare HMO | Admitting: Physical Therapy

## 2018-08-22 DIAGNOSIS — R293 Abnormal posture: Secondary | ICD-10-CM | POA: Diagnosis not present

## 2018-08-22 DIAGNOSIS — R208 Other disturbances of skin sensation: Secondary | ICD-10-CM | POA: Diagnosis not present

## 2018-08-22 DIAGNOSIS — M542 Cervicalgia: Secondary | ICD-10-CM | POA: Diagnosis not present

## 2018-08-22 DIAGNOSIS — G8929 Other chronic pain: Secondary | ICD-10-CM | POA: Diagnosis not present

## 2018-08-22 DIAGNOSIS — M25512 Pain in left shoulder: Secondary | ICD-10-CM

## 2018-08-22 DIAGNOSIS — M6281 Muscle weakness (generalized): Secondary | ICD-10-CM

## 2018-08-22 NOTE — Patient Instructions (Signed)
Cane Exercise: Flexion   Lie on back, holding cane above chest. Keeping arms as straight as possible, lower cane toward floor beyond head. Hold __5__ seconds. Repeat __10-20__ times. Do ___2_ sessions per day.    Side Pull: Double Arm   On back, knees bent, feet flat. Arms perpendicular to body, shoulder level, elbows straight but relaxed. Pull arms out to sides, elbows straight. Resistance band comes across collarbones, hands toward floor. Hold momentarily. Slowly return to starting position. Repeat _10-20__ times. Band color ___R__     Shoulder Rotation: Double Arm   On back, knees bent, feet flat, elbows tucked at sides, bent 90, hands palms up. Pull hands apart and down toward floor, keeping elbows near sides. Hold momentarily. Slowly return to starting position. Repeat _10-20__ times. Band color __R____

## 2018-08-22 NOTE — Therapy (Signed)
[place], [place], [address] Phone: [phone]   Fax:  [phone]  Physical Therapy Treatment  Patient Details  Name: [name] MRN: [id number] Date of Birth: [birth year] Referring Provider (PT): Caroline More, DO    Encounter Date: 08/22/2018  PT End of Session - 08/22/18 0722    Visit Number  3    Number of Visits  12    Date for PT Re-Evaluation  09/24/18    PT Start Time  0715    PT Stop Time  0815    PT Time Calculation (min)  60 min       Past Medical History:  Diagnosis Date  . Allergy   . Anxiety   . 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 Carpal tunnel syndrome, bilateral   . Chest pain   . Heart murmur    age 62 said had heart murmur.  . Hyperlipidemia   . Hypertension   . Normal cardiac stress test    Myoview stress test  . Osteopenia 05/2018   T score -1.1 FRAX 2.5%/ 0.1%    Past Surgical History:  Procedure Laterality Date  . COLONOSCOPY    . POLYPECTOMY    . 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 TOTAL HIP ARTHROPLASTY Left 10/01/2013   Procedure: TOTAL HIP ARTHROPLASTY;  Surgeon: Kerin Salen, MD;  Location: [facility];  Service: Orthopedics;  Laterality: Left;    There were no vitals filed for this visit.  Subjective Assessment - 08/22/18 0719    Subjective  I feel like the exercises are helping,     Currently in Pain?  Yes    Pain Score  5     Pain Location  Neck    Pain Orientation  Left;Right;Posterior    Pain Descriptors / Indicators  Tightness                       [facility] Adult PT Treatment/Exercise - 08/22/18 0001      Exercises   Exercises  Shoulder      Neck Exercises: Supine   Neck Retraction  10 reps    Other Supine Exercise  cervical stabilization exercises from exercise drawer all practiced 5 x each cues needed.       Shoulder Exercises: Supine   Other Supine Exercises  supine cane pullovers , press ups,  horizontal abduction/adduction      Shoulder Exercises: Standing   Row  15 reps;Theraband    Theraband Level (Shoulder Row)  Level 3 (Green)      Shoulder Exercises: IT sales professional  3 reps;20 seconds    Corner Stretch Limitations  doorwya-limited by left shoulder pain      Moist Heat Therapy   Number Minutes Moist Heat  15 Minutes    Moist Heat Location  Cervical   and shoulder left     Neck Exercises: Stretches   Upper Trapezius Stretch  2 reps    Levator Stretch  2 reps             PT Education - 08/22/18 0749    Education Details  HEP     Person(s) Educated  Patient    Methods  Explanation;Handout    Comprehension  Verbalized understanding          PT Long Term Goals - 08/13/18 1303      PT LONG TERM GOAL #  1   Title  Pt will be I with HEP for UE strength, neck, core     Time  6    Period  Weeks    Status  New    Target Date  09/24/18      PT LONG TERM GOAL #2   Title  Pt will be able to increase bilateral UE strength to 4/5 in all planes for normal, functional mobility     Baseline  3/5 poor effort due to pain avoidance     Time  6    Period  Weeks    Status  New    Target Date  09/24/18      PT LONG TERM GOAL #3   Title  Pt will be able to report no difficulty rolling over in bed , sleeping due to neck pain    Time  6    Period  Weeks    Status  New    Target Date  09/24/18      PT LONG TERM GOAL #4   Title  Pt will be able to reach overhead without neck or shoulder pain     Time  6    Period  Weeks    Status  New    Target Date  09/24/18      PT LONG TERM GOAL #5   Title  Pt will be able to exercise, manage pain at home with self care strategies, positioning     Time  6    Period  Weeks    Status  New    Target Date  09/24/18            Plan - 08/22/18 0749    Clinical Impression Statement  Pt reports improvement in pain with exercises and posture adjustments. Able to progress HEP. She does have left lateral upper arm  pain with shoulder flexion exercises. Did well with flexion AAROM.     PT Next Visit Plan  HEP,  continue stabilization, manual, IFC/heat -review new HEP     PT Home Exercise Plan  chin tuck, posture, scapular retraction , supine horizontal and ER red band, supine cane pullovers     Consulted and Agree with Plan of Care  Patient       Patient will benefit from skilled therapeutic intervention in order to improve the following deficits and impairments:  Decreased mobility, Increased muscle spasms, Impaired sensation, Obesity, Improper body mechanics, Decreased range of motion, Decreased strength, Increased fascial restricitons, Impaired flexibility, Impaired UE functional use, Postural dysfunction, Pain, Decreased activity tolerance  Visit Diagnosis: Cervicalgia  Muscle weakness (generalized)  Chronic left shoulder pain  Abnormal posture  Other disturbances of skin sensation     Problem List Patient Active Problem List   Diagnosis Date Noted  . Neuropathic pain 06/19/2018  . Other cervical disc degeneration, unspecified cervical region 06/19/2018  . Elevated PTHrP level 06/19/2018  . Stress 02/22/2018  . Postmenopausal bleeding 11/30/2017  . Keratosis pilaris 04/12/2016  . OAB (overactive bladder) 09/01/2015  . 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 Hip arthritis 10/01/2013  . UNSPECIFIED EPISODIC MOOD DISORDER 01/04/2010  . THYROMEGALY 12/30/2009  . Hyperlipidemia 11/09/2008  . Subclinical hyperthyroidism 05/08/2007  . HYPERTENSION, BENIGN ESSENTIAL 01/15/2007  . Obesity 12/13/2006    Dorene Ar 08/22/2018, 9:47 AM  [facility] [address] [place], [place], [address] Phone: [phone]   Fax:  [phone]  Name: [name] MRN: [id number] Date of Birth: 04-27-1956

## 2018-08-26 ENCOUNTER — Encounter: Payer: Self-pay | Admitting: Physical Therapy

## 2018-08-26 ENCOUNTER — Ambulatory Visit: Payer: Medicare HMO | Admitting: Physical Therapy

## 2018-08-26 DIAGNOSIS — R293 Abnormal posture: Secondary | ICD-10-CM

## 2018-08-26 DIAGNOSIS — G8929 Other chronic pain: Secondary | ICD-10-CM

## 2018-08-26 DIAGNOSIS — M25512 Pain in left shoulder: Secondary | ICD-10-CM

## 2018-08-26 DIAGNOSIS — M542 Cervicalgia: Secondary | ICD-10-CM

## 2018-08-26 DIAGNOSIS — M6281 Muscle weakness (generalized): Secondary | ICD-10-CM | POA: Diagnosis not present

## 2018-08-26 DIAGNOSIS — R208 Other disturbances of skin sensation: Secondary | ICD-10-CM | POA: Diagnosis not present

## 2018-08-26 NOTE — Therapy (Signed)
[place], [place], [address] Phone: [phone]   Fax:  [phone]  Physical Therapy Treatment  Patient Details  Name: [name] MRN: [id number] Date of Birth: [birth year] Referring Provider (PT): Caroline More, DO    Encounter Date: 08/26/2018  PT End of Session - 08/26/18 0806    Visit Number  4    Number of Visits  12    Date for PT Re-Evaluation  09/24/18    PT Start Time  0802    PT Stop Time  0900    PT Time Calculation (min)  58 min       Past Medical History:  Diagnosis Date  . Allergy   . Anxiety   . 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 Carpal tunnel syndrome, bilateral   . Chest pain   . Heart murmur    age 62 said had heart murmur.  . Hyperlipidemia   . Hypertension   . Normal cardiac stress test    Myoview stress test  . Osteopenia 05/2018   T score -1.1 FRAX 2.5%/ 0.1%    Past Surgical History:  Procedure Laterality Date  . COLONOSCOPY    . POLYPECTOMY    . 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 TOTAL HIP ARTHROPLASTY Left 10/01/2013   Procedure: TOTAL HIP ARTHROPLASTY;  Surgeon: Kerin Salen, MD;  Location: [facility];  Service: Orthopedics;  Laterality: Left;    There were no vitals filed for this visit.  Subjective Assessment - 08/26/18 0804    Subjective  I have moments of pain.     Currently in Pain?  Yes    Pain Score  4     Pain Location  Neck    Pain Orientation  Left;Right;Posterior    Pain Descriptors / Indicators  Tightness    Pain Type  Chronic pain    Pain Radiating Towards  left shoulder    Aggravating Factors   slouching     Pain Relieving Factors  hot shower , good posture                       [facility] Adult PT Treatment/Exercise - 08/26/18 0001      Exercises   Exercises  Shoulder      Neck Exercises: Supine   Neck Retraction  10 reps    Other Supine Exercise  cervical stabilization exercises from  exercise drawer all practiced 5 x each cues needed.       Shoulder Exercises: Supine   Other Supine Exercises  supine cane pullovers , press ups, horizontal abduction/adduction    Other Supine Exercises  supine red band ER , horizontal       Shoulder Exercises: Standing   Row  15 reps;Theraband    Theraband Level (Shoulder Row)  Level 3 (Green)      Shoulder Exercises: Pulleys   Flexion  2 minutes      Shoulder Exercises: Stretch   Corner Stretch  3 reps;20 seconds    Corner Stretch Limitations  doorwya-limited by left shoulder pain      Moist Heat Therapy   Number Minutes Moist Heat  15 Minutes    Moist Heat Location  Cervical;Shoulder      Neck Exercises: Stretches   Upper Trapezius Stretch  2 reps    Levator Stretch  2 reps  PT Long Term Goals - 08/13/18 1303      PT LONG TERM GOAL #1   Title  Pt will be I with HEP for UE strength, neck, core     Time  6    Period  Weeks    Status  New    Target Date  09/24/18      PT LONG TERM GOAL #2   Title  Pt will be able to increase bilateral UE strength to 4/5 in all planes for normal, functional mobility     Baseline  3/5 poor effort due to pain avoidance     Time  6    Period  Weeks    Status  New    Target Date  09/24/18      PT LONG TERM GOAL #3   Title  Pt will be able to report no difficulty rolling over in bed , sleeping due to neck pain    Time  6    Period  Weeks    Status  New    Target Date  09/24/18      PT LONG TERM GOAL #4   Title  Pt will be able to reach overhead without neck or shoulder pain     Time  6    Period  Weeks    Status  New    Target Date  09/24/18      PT LONG TERM GOAL #5   Title  Pt will be able to exercise, manage pain at home with self care strategies, positioning     Time  6    Period  Weeks    Status  New    Target Date  09/24/18            Plan - 08/26/18 3546    Clinical Impression Statement  Pt reports improvement with decreased  intensity and frequency of pain. Continued with postural stretches and strengthening as tolerated. Some limitations from left shoulder pain with flexion and ER ROM.     PT Next Visit Plan  HEP,  continue stabilization, manual, IFC/heat -review new HEP     PT Home Exercise Plan  chin tuck, posture, scapular retraction , supine horizontal and ER red band, supine cane pullovers     Consulted and Agree with Plan of Care  Patient       Patient will benefit from skilled therapeutic intervention in order to improve the following deficits and impairments:  Decreased mobility, Increased muscle spasms, Impaired sensation, Obesity, Improper body mechanics, Decreased range of motion, Decreased strength, Increased fascial restricitons, Impaired flexibility, Impaired UE functional use, Postural dysfunction, Pain, Decreased activity tolerance  Visit Diagnosis: Cervicalgia  Muscle weakness (generalized)  Chronic left shoulder pain  Abnormal posture  Other disturbances of skin sensation     Problem List Patient Active Problem List   Diagnosis Date Noted  . Neuropathic pain 06/19/2018  . Other cervical disc degeneration, unspecified cervical region 06/19/2018  . Elevated PTHrP level 06/19/2018  . Stress 02/22/2018  . Postmenopausal bleeding 11/30/2017  . Keratosis pilaris 04/12/2016  . OAB (overactive bladder) 09/01/2015  . 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 Hip arthritis 10/01/2013  . UNSPECIFIED EPISODIC MOOD DISORDER 01/04/2010  . THYROMEGALY 12/30/2009  . Hyperlipidemia 11/09/2008  . Subclinical hyperthyroidism 05/08/2007  . HYPERTENSION, BENIGN ESSENTIAL 01/15/2007  . Obesity 12/13/2006    Dorene Ar, PTA 08/26/2018, 9:03 AM  [place], [place], [address] Phone: [phone]  Fax:  [phone]  Name: JAZZIE TRAMPE MRN: [id number] Date of Birth: [birth year]

## 2018-08-29 ENCOUNTER — Other Ambulatory Visit: Payer: Self-pay | Admitting: Family Medicine

## 2018-08-29 DIAGNOSIS — Z1231 Encounter for screening mammogram for malignant neoplasm of breast: Secondary | ICD-10-CM

## 2018-08-30 ENCOUNTER — Encounter: Payer: Self-pay | Admitting: Physical Therapy

## 2018-08-30 ENCOUNTER — Ambulatory Visit: Payer: Medicare HMO | Admitting: Physical Therapy

## 2018-08-30 DIAGNOSIS — M542 Cervicalgia: Secondary | ICD-10-CM

## 2018-08-30 DIAGNOSIS — M6281 Muscle weakness (generalized): Secondary | ICD-10-CM

## 2018-08-30 DIAGNOSIS — R208 Other disturbances of skin sensation: Secondary | ICD-10-CM | POA: Diagnosis not present

## 2018-08-30 DIAGNOSIS — G8929 Other chronic pain: Secondary | ICD-10-CM | POA: Diagnosis not present

## 2018-08-30 DIAGNOSIS — M25512 Pain in left shoulder: Secondary | ICD-10-CM | POA: Diagnosis not present

## 2018-08-30 DIAGNOSIS — R293 Abnormal posture: Secondary | ICD-10-CM | POA: Diagnosis not present

## 2018-08-30 NOTE — Therapy (Signed)
[place], [place], [address] Phone: [phone]   Fax:  [phone]  Physical Therapy Treatment  Patient Details  Name: [name] MRN: [id number] Date of Birth: 07-19-1956 Referring Provider (PT): Caroline More, DO    Encounter Date: 08/30/2018  PT End of Session - 08/30/18 0831    Visit Number  5    Number of Visits  12    Date for PT Re-Evaluation  09/24/18    PT Start Time  0800    PT Stop Time  0900    PT Time Calculation (min)  60 min       Past Medical History:  Diagnosis Date  . Allergy   . Anxiety   . 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 Carpal tunnel syndrome, bilateral   . Chest pain   . Heart murmur    age 62 said had heart murmur.  . Hyperlipidemia   . Hypertension   . Normal cardiac stress test    Myoview stress test  . Osteopenia 05/2018   T score -1.1 FRAX 2.5%/ 0.1%    Past Surgical History:  Procedure Laterality Date  . COLONOSCOPY    . POLYPECTOMY    . 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 TOTAL HIP ARTHROPLASTY Left 10/01/2013   Procedure: TOTAL HIP ARTHROPLASTY;  Surgeon: Kerin Salen, MD;  Location: [facility];  Service: Orthopedics;  Laterality: Left;    There were no vitals filed for this visit.  Subjective Assessment - 08/30/18 0803    Subjective  No pain    Currently in Pain?  No/denies         [facility] PT Assessment - 08/30/18 0001      Observation/Other Assessments   Focus on Therapeutic Outcomes (FOTO)   30% limited improved from 61% limited       AROM   Right Shoulder Flexion  150 Degrees    Left Shoulder Flexion  155 Degrees    Cervical - Right Side Bend  45    Cervical - Left Side Bend  50    Cervical - Right Rotation  70    Cervical - Left Rotation  70      Strength   Right Shoulder Flexion  4/5    Right Shoulder ABduction  4/5    Left Shoulder Flexion  4/5    Left Shoulder ABduction  4/5                    [facility] Adult PT Treatment/Exercise - 08/30/18 0001      Exercises   Exercises  --      Neck Exercises: Supine   Neck Retraction  10 reps    Other Supine Exercise  cervical stabilization exercises from exercise drawer all practiced 5 x each cues needed.       Shoulder Exercises: Supine   Protraction  10 reps    Protraction Limitations  with cane       Shoulder Exercises: Standing   Horizontal ABduction  15 reps;Theraband    Theraband Level (Shoulder Horizontal ABduction)  Level 2 (Red)    External Rotation  15 reps    Theraband Level (Shoulder External Rotation)  Level 2 (Red)    Other Standing Exercises  bilat wall slides with forearms       Shoulder Exercises: Pulleys   Flexion  2 minutes      Shoulder Exercises:  ROM/Strengthening   UBE (Upper Arm Bike)  L1 retro 3 min forward 2 minutes       Shoulder Exercises: Stretch   Corner Stretch  3 reps;20 seconds    Corner Stretch Limitations  doorwya-limited by left shoulder pain      Shoulder Exercises: Power [employer] Limitations  seated row 20# low and mid x 15 each       Neck Exercises: Stretches   Upper Trapezius Stretch  2 reps    Levator Stretch  2 reps                  PT Long Term Goals - 08/30/18 0832      PT LONG TERM GOAL #1   Title  Pt will be I with HEP for UE strength, neck, core     Time  6    Period  Weeks    Status  On-going      PT LONG TERM GOAL #2   Title  Pt will be able to increase bilateral UE strength to 4/5 in all planes for normal, functional mobility     Baseline  4/5 bilat UE     Time  6    Period  Weeks    Status  Achieved      PT LONG TERM GOAL #3   Title  Pt will be able to report no difficulty rolling over in bed , sleeping due to neck pain    Baseline  no dofficulty    Time  6    Period  Weeks    Status  Achieved      PT LONG TERM GOAL #4   Title  Pt will be able to reach overhead without neck or shoulder pain     Baseline  pt reports  she is not afraid to move anymore, shoulder flexion improved to 155 from 115 at eval    Status  Partially Met      PT LONG TERM GOAL #5   Title  Pt will be able to exercise, manage pain at home with self care strategies, positioning     Baseline  Pt reports she has learned alot in PT and does not avoid movement anymore, uses good posture, heat to decrease sx    Time  6    Period  Weeks    Status  Achieved            Plan - 08/30/18 0856    Clinical Impression Statement  Pt reports continued improvement. She reports left shoulder pain with being out in cold weather which moved to her right shoulder and then resolved. Overall she reports significant decrease in pain and improvement in function. Her left shoulder flexion is [facility] and she reports no pain with reaching and decreased fear of movement. Her bilateral shoulder strength has improved to 4/5. Some fatigue in left shoulder flexion by end of session. She no longer has pain or dificulty with bed mobility and sleeping. Her FOTO score improved from 61% limited. She has met most goals. Will plan for one more visit to finalize HEP and answer any questions.     PT Next Visit Plan  Review/finalize HEP, FOTO status complete this session, answer any questions, ready for DC next visit if still doing well.     PT Home Exercise Plan  chin tuck, posture, scapular retraction , supine horizontal and ER red band, supine cane pullovers     Consulted and Agree with Plan  of Care  Patient       Patient will benefit from skilled therapeutic intervention in order to improve the following deficits and impairments:  Decreased mobility, Increased muscle spasms, Impaired sensation, Obesity, Improper body mechanics, Decreased range of motion, Decreased strength, Increased fascial restricitons, Impaired flexibility, Impaired UE functional use, Postural dysfunction, Pain, Decreased activity tolerance  Visit Diagnosis: Cervicalgia  Muscle weakness  (generalized)  Chronic left shoulder pain  Abnormal posture  Other disturbances of skin sensation     Problem List Patient Active Problem List   Diagnosis Date Noted  . Neuropathic pain 06/19/2018  . Other cervical disc degeneration, unspecified cervical region 06/19/2018  . Elevated PTHrP level 06/19/2018  . Stress 02/22/2018  . Postmenopausal bleeding 11/30/2017  . Keratosis pilaris 04/12/2016  . OAB (overactive bladder) 09/01/2015  . 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 Hip arthritis 10/01/2013  . UNSPECIFIED EPISODIC MOOD DISORDER 01/04/2010  . THYROMEGALY 12/30/2009  . Hyperlipidemia 11/09/2008  . Subclinical hyperthyroidism 05/08/2007  . HYPERTENSION, BENIGN ESSENTIAL 01/15/2007  . Obesity 12/13/2006    Dorene Ar, PTA 08/30/2018, 9:01 AM  [place], [place], [address] Phone: [phone]   Fax:  [phone]  Name: [name] MRN: [id number] Date of Birth: 03-23-1956

## 2018-09-03 ENCOUNTER — Ambulatory Visit: Payer: Medicare HMO | Admitting: Physical Therapy

## 2018-09-05 ENCOUNTER — Encounter: Payer: Self-pay | Admitting: Physical Therapy

## 2018-09-05 ENCOUNTER — Ambulatory Visit: Payer: Medicare HMO | Admitting: Physical Therapy

## 2018-09-05 DIAGNOSIS — M6281 Muscle weakness (generalized): Secondary | ICD-10-CM

## 2018-09-05 DIAGNOSIS — R293 Abnormal posture: Secondary | ICD-10-CM | POA: Diagnosis not present

## 2018-09-05 DIAGNOSIS — R208 Other disturbances of skin sensation: Secondary | ICD-10-CM | POA: Diagnosis not present

## 2018-09-05 DIAGNOSIS — G8929 Other chronic pain: Secondary | ICD-10-CM

## 2018-09-05 DIAGNOSIS — M25512 Pain in left shoulder: Secondary | ICD-10-CM | POA: Diagnosis not present

## 2018-09-05 DIAGNOSIS — M542 Cervicalgia: Secondary | ICD-10-CM

## 2018-09-05 NOTE — Therapy (Addendum)
[place], [place], [address] Phone: [phone]   Fax:  [phone]  Physical Therapy Treatment/Discharge  Patient Details  Name: [name] MRN: [id number] Date of Birth: 1955/12/17 Referring Provider (PT): Caroline More, DO    Encounter Date: 09/05/2018  PT End of Session - 09/05/18 0811    Visit Number  6    Number of Visits  12    Date for PT Re-Evaluation  09/24/18    PT Start Time  0810    PT Stop Time  0855    PT Time Calculation (min)  45 min       Past Medical History:  Diagnosis Date  . Allergy   . Anxiety   . 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 Carpal tunnel syndrome, bilateral   . Chest pain   . Heart murmur    age 45 said had heart murmur.  . Hyperlipidemia   . Hypertension   . Normal cardiac stress test    Myoview stress test  . Osteopenia 05/2018   T score -1.1 FRAX 2.5%/ 0.1%    Past Surgical History:  Procedure Laterality Date  . COLONOSCOPY    . POLYPECTOMY    . 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 TOTAL HIP ARTHROPLASTY Left 10/01/2013   Procedure: TOTAL HIP ARTHROPLASTY;  Surgeon: Kerin Salen, MD;  Location: [facility];  Service: Orthopedics;  Laterality: Left;    There were no vitals filed for this visit.  Subjective Assessment - 09/05/18 0811    Subjective  No pain.     Currently in Pain?  No/denies                       [facility] Adult PT Treatment/Exercise - 09/05/18 0001      Exercises   Exercises  Shoulder      Neck Exercises: Supine   Neck Retraction  10 reps    Other Supine Exercise  cervical stabilization exercises from exercise drawer all practiced 5 x each cues needed.       Shoulder Exercises: Supine   Protraction  10 reps    Protraction Limitations  with cane     Other Supine Exercises  supine cane pullovers      Shoulder Exercises: Standing   Horizontal ABduction  15 reps;Theraband    Theraband  Level (Shoulder Horizontal ABduction)  Level 2 (Red)    External Rotation  15 reps    Theraband Level (Shoulder External Rotation)  Level 2 (Red)    Extension  15 reps    Theraband Level (Shoulder Extension)  Level 3 (Green)    Other Standing Exercises  bilat wall slides with forearms       Shoulder Exercises: Pulleys   Flexion  2 minutes      Shoulder Exercises: ROM/Strengthening   UBE (Upper Arm Bike)  L1 retro 3 min forward 2 minutes       Shoulder Exercises: Stretch   Corner Stretch  3 reps;20 seconds      Shoulder Exercises: Power Warehouse manager Limitations  seated row 20# low and mid x 15 each       Moist Heat Therapy   Number Minutes Moist Heat  15 Minutes    Moist Heat Location  Cervical;Shoulder      Neck Exercises: Stretches   Upper Trapezius Stretch  2 reps    Actor  2 reps                  PT Long Term Goals - 09/05/18 9381      PT LONG TERM GOAL #1   Title  Pt will be I with HEP for UE strength, neck, core     Time  6    Period  Weeks    Status  Achieved      PT LONG TERM GOAL #2   Title  Pt will be able to increase bilateral UE strength to 4/5 in all planes for normal, functional mobility     Baseline  4/5 bilat UE     Time  6    Period  Weeks    Status  Achieved      PT LONG TERM GOAL #3   Title  Pt will be able to report no difficulty rolling over in bed , sleeping due to neck pain    Baseline  no dofficulty    Time  6    Period  Weeks    Status  Achieved      PT LONG TERM GOAL #4   Title  Pt will be able to reach overhead without neck or shoulder pain     Baseline  pt reports she is not afraid to move anymore, shoulder flexion improved to 155 from 115 at eval    Time  6    Period  Weeks    Status  Achieved      PT LONG TERM GOAL #5   Title  Pt will be able to exercise, manage pain at home with self care strategies, positioning     Baseline  Pt reports she has learned alot in PT and does not avoid movement anymore, uses  good posture, heat to decrease sx    Time  6    Period  Weeks    Status  Achieved            Plan - 09/05/18 8299    Clinical Impression Statement  Pt reports doing well after last visit and over the last weeks. We reviewed her HEP and she is independent. She is pleased with current level of function and agreeable to discharge. All LTGs Met.     PT Next Visit Plan  Discharge today     PT Home Exercise Plan  chin tuck, posture, scapular retraction , supine horizontal and ER red band, supine cane pullovers     Consulted and Agree with Plan of Care  Patient       Patient will benefit from skilled therapeutic intervention in order to improve the following deficits and impairments:  Decreased mobility, Increased muscle spasms, Impaired sensation, Obesity, Improper body mechanics, Decreased range of motion, Decreased strength, Increased fascial restricitons, Impaired flexibility, Impaired UE functional use, Postural dysfunction, Pain, Decreased activity tolerance  Visit Diagnosis: Cervicalgia  Muscle weakness (generalized)  Chronic left shoulder pain  Abnormal posture  Other disturbances of skin sensation     Problem List Patient Active Problem List   Diagnosis Date Noted  . Neuropathic pain 06/19/2018  . Other cervical disc degeneration, unspecified cervical region 06/19/2018  . Elevated PTHrP level 06/19/2018  . Stress 02/22/2018  . Postmenopausal bleeding 11/30/2017  . Keratosis pilaris 04/12/2016  . OAB (overactive bladder) 09/01/2015  . 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 Hip arthritis 10/01/2013  . UNSPECIFIED EPISODIC MOOD DISORDER 01/04/2010  . THYROMEGALY 12/30/2009  . Hyperlipidemia 11/09/2008  . Subclinical hyperthyroidism 05/08/2007  .  HYPERTENSION, BENIGN ESSENTIAL 01/15/2007  . Obesity 12/13/2006    Dorene Ar, PTA 09/05/2018, 8:48 AM  [facility] [address] [place], [place],  [address] Phone: [phone]   Fax:  [phone]  Name: AKIRA PERUSSE MRN: [id number] Date of Birth: 1956-10-06   PHYSICAL THERAPY DISCHARGE SUMMARY  Visits from Start of Care: 6  Current functional level related to goals / functional outcomes: See above    Remaining deficits: None limiting function   Education / Equipment: HEP, ROM, strength, posture , RICE  Plan: Patient agrees to discharge.  Patient goals were met. Patient is being discharged due to meeting the stated rehab goals.  ?????    Raeford Razor, PT 09/05/18 10:11 AM Phone: [phone] Fax: [phone]

## 2018-09-09 ENCOUNTER — Ambulatory Visit: Payer: Medicare HMO | Admitting: Physical Therapy

## 2018-09-11 ENCOUNTER — Ambulatory Visit: Payer: Medicare HMO | Admitting: Physical Therapy

## 2018-09-19 ENCOUNTER — Other Ambulatory Visit: Payer: Self-pay | Admitting: Family Medicine

## 2018-09-19 DIAGNOSIS — Z1231 Encounter for screening mammogram for malignant neoplasm of breast: Secondary | ICD-10-CM

## 2018-10-01 ENCOUNTER — Telehealth: Payer: Self-pay | Admitting: Family Medicine

## 2018-10-01 NOTE — Telephone Encounter (Signed)
Patient is not sure if she asked ahead of time, but she checked with her insurance and they said she could get a referral paper to go to [facility]   (after the fact as she has already been there, first time was 05/24/2017, then she went 12/24/17, 02/04/18, 05/28/18).  Please call her if you have questions as she needs this proof of referral asap.  [phone].

## 2018-10-10 ENCOUNTER — Other Ambulatory Visit: Payer: Self-pay

## 2018-10-10 MED ORDER — LORATADINE 10 MG PO TABS: 10.0000 mg | ORAL_TABLET | Freq: Every day | ORAL | 3 refills | Status: AC

## 2018-10-14 ENCOUNTER — Ambulatory Visit: Payer: Medicare HMO | Admitting: Family Medicine

## 2018-11-01 ENCOUNTER — Ambulatory Visit
Admission: RE | Admit: 2018-11-01 | Discharge: 2018-11-01 | Disposition: A | Discharge status: Home / Self Care | Payer: Medicare Other | Source: Ambulatory Visit | Attending: Family Medicine | Admitting: Family Medicine

## 2018-11-01 DIAGNOSIS — Z1231 Encounter for screening mammogram for malignant neoplasm of breast: Secondary | ICD-10-CM

## 2018-11-26 DIAGNOSIS — M25512 Pain in left shoulder: Secondary | ICD-10-CM | POA: Diagnosis not present

## 2018-12-05 DIAGNOSIS — M25512 Pain in left shoulder: Secondary | ICD-10-CM | POA: Diagnosis not present

## 2018-12-16 ENCOUNTER — Other Ambulatory Visit: Payer: Self-pay | Admitting: *Deleted

## 2018-12-18 MED ORDER — HYDROCHLOROTHIAZIDE 25 MG PO TABS: 25.0000 mg | ORAL_TABLET | Freq: Every day | ORAL | 6 refills | Status: DC

## 2018-12-18 MED ORDER — METOPROLOL TARTRATE 50 MG PO TABS: 50.0000 mg | ORAL_TABLET | Freq: Two times a day (BID) | ORAL | 3 refills | Status: DC

## 2018-12-20 DIAGNOSIS — M67912 Unspecified disorder of synovium and tendon, left shoulder: Secondary | ICD-10-CM | POA: Diagnosis not present

## 2018-12-20 DIAGNOSIS — M24812 Other specific joint derangements of left shoulder, not elsewhere classified: Secondary | ICD-10-CM | POA: Diagnosis not present

## 2019-05-30 ENCOUNTER — Ambulatory Visit (INDEPENDENT_AMBULATORY_CARE_PROVIDER_SITE_OTHER): Payer: Medicare Other | Admitting: Obstetrics & Gynecology

## 2019-05-30 ENCOUNTER — Other Ambulatory Visit: Payer: Self-pay

## 2019-05-30 ENCOUNTER — Encounter: Payer: Self-pay | Admitting: Obstetrics & Gynecology

## 2019-05-30 VITALS — BP 136/88 | Ht 61.5 in | Wt 214.0 lb

## 2019-05-30 DIAGNOSIS — Z01419 Encounter for gynecological examination (general) (routine) without abnormal findings: Secondary | ICD-10-CM

## 2019-05-30 DIAGNOSIS — M85831 Other specified disorders of bone density and structure, right forearm: Secondary | ICD-10-CM

## 2019-05-30 DIAGNOSIS — E6609 Other obesity due to excess calories: Secondary | ICD-10-CM | POA: Diagnosis not present

## 2019-05-30 DIAGNOSIS — Z780 Asymptomatic menopausal state: Secondary | ICD-10-CM

## 2019-05-30 DIAGNOSIS — Z6839 Body mass index (BMI) 39.0-39.9, adult: Secondary | ICD-10-CM

## 2019-05-30 DIAGNOSIS — E66812 Obesity, class 2: Secondary | ICD-10-CM

## 2019-05-30 NOTE — Progress Notes (Signed)
[name] May 17, 1956 [id number]   History:    63 y.o. [id number] Married.  Caring for her husband and brother post Strokes.  RP:  Established patient presenting for annual gyn exam   HPI: Postmenopausal, well on no hormone replacement therapy.  No postmenopausal bleeding.  No pelvic pain.  No pain with intercourse.  Urine and bowel movements normal.  Breasts normal.  Body mass index 39.78.  Needs to take more time for herself.  Not currently exercising regularly.  Will schedule an appointment with her family physician for fasting health labs.  Past medical history,surgical history, family history and social history were all reviewed and documented in the EPIC chart.  Gynecologic History No LMP recorded. Patient is postmenopausal. Contraception: post menopausal status Last Pap: 05/2018. Results were: Negative Last mammogram: 10/2018. Results were: Negative Bone Density: 05/2018 Osteopenia -1.1 at Rt Forearm Colonoscopy: 2015  Obstetric History OB History  Gravida Para Term Preterm AB Living  4 3     1 3   SAB TAB Ectopic Multiple Live Births  1            # Outcome Date GA Lbr Len/2nd Weight Sex Delivery Anes PTL Lv  4 SAB           3 Para           2 Para           1 Para              ROS: A ROS was performed and pertinent positives and negatives are included in the history.  GENERAL: No fevers or chills. HEENT: No change in vision, no earache, sore throat or sinus congestion. NECK: No pain or stiffness. CARDIOVASCULAR: No chest pain or pressure. No palpitations. PULMONARY: No shortness of breath, cough or wheeze. GASTROINTESTINAL: No abdominal pain, nausea, vomiting or diarrhea, melena or bright red blood per rectum. GENITOURINARY: No urinary frequency, urgency, hesitancy or dysuria. MUSCULOSKELETAL: No joint or muscle pain, no back pain, no recent trauma. DERMATOLOGIC: No rash, no itching, no lesions. ENDOCRINE: No polyuria, polydipsia, no heat or cold intolerance. No recent  change in weight. HEMATOLOGICAL: No anemia or easy bruising or bleeding. NEUROLOGIC: No headache, seizures, numbness, tingling or weakness. PSYCHIATRIC: No depression, no loss of interest in normal activity or change in sleep pattern.     Exam:   BP 136/88   Ht 5' 1.5" (1.562 m)   Wt 214 lb (97.1 kg)   BMI 39.78 kg/m   Body mass index is 39.78 kg/m.  General appearance : Well developed well nourished female. No acute distress HEENT: Eyes: no retinal hemorrhage or exudates,  Neck supple, trachea midline, no carotid bruits, no thyroidmegaly Lungs: Clear to auscultation, no rhonchi or wheezes, or rib retractions  Heart: Regular rate and rhythm, no murmurs or gallops Breast:Examined in sitting and supine position were symmetrical in appearance, no palpable masses or tenderness,  no skin retraction, no nipple inversion, no nipple discharge, no skin discoloration, no axillary or supraclavicular lymphadenopathy Abdomen: no palpable masses or tenderness, no rebound or guarding Extremities: no edema or skin discoloration or tenderness  Pelvic: Vulva: Normal             Vagina: No gross lesions or discharge  Cervix: No gross lesions or discharge  Uterus  AV, normal size, shape and consistency, non-tender and mobile  Adnexa  Without masses or tenderness  Anus: Normal   Assessment/Plan:  63 y.o. female for annual exam  1. Well female exam with routine gynecological exam Normal gynecologic exam.  Pap test August 2019 was negative, no indication to repeat this year.  Breast exam normal.  Screening mammogram January 2020 was negative  2. Postmenopause Well on no HRT.  No PMB.    3. Osteopenia of right forearm BD 05/2018 Osteopenia -1.1 at Rt Forearm.  Vitamin D Supplements, Ca++ intake of 1200 mg daily, regular weightbearing physical activities.  4. Class 2 obesity due to excess calories without serious comorbidity with body mass index (BMI) of 39.0 to 39.9 in adult Recommend a low  calorie/carb diet such as [employer].  Aerobic physical activities 5 times a week and weightlifting every 2 days.  Princess Bruins MD, 10:55 AM 05/30/2019

## 2019-05-30 NOTE — Patient Instructions (Addendum)
1. Well female exam with routine gynecological exam Normal gynecologic exam.  Pap test August 2019 was negative, no indication to repeat this year.  Breast exam normal.  Screening mammogram January 2020 was negative  2. Postmenopause Well on no HRT.  No PMB.    3. Osteopenia of right forearm BD 05/2018 Osteopenia -1.1 at Rt Forearm.  Vitamin D Supplements, Ca++ intake of 1200 mg daily, regular weightbearing physical activities.  4. Class 2 obesity due to excess calories without serious comorbidity with body mass index (BMI) of 39.0 to 39.9 in adult Recommend a low calorie/carb diet such as [employer].  Aerobic physical activities 5 times a week and weightlifting every 2 days.  [name], it was a pleasure seeing you today!

## 2019-06-02 ENCOUNTER — Other Ambulatory Visit: Payer: Medicare Other

## 2019-06-02 ENCOUNTER — Other Ambulatory Visit: Payer: Self-pay

## 2019-06-02 DIAGNOSIS — D219 Benign neoplasm of connective and other soft tissue, unspecified: Secondary | ICD-10-CM | POA: Diagnosis not present

## 2019-06-02 DIAGNOSIS — M47812 Spondylosis without myelopathy or radiculopathy, cervical region: Secondary | ICD-10-CM | POA: Diagnosis not present

## 2019-06-02 DIAGNOSIS — I10 Essential (primary) hypertension: Secondary | ICD-10-CM | POA: Diagnosis not present

## 2019-06-10 ENCOUNTER — Other Ambulatory Visit: Payer: Self-pay

## 2019-06-10 DIAGNOSIS — R6889 Other general symptoms and signs: Secondary | ICD-10-CM | POA: Diagnosis not present

## 2019-06-10 DIAGNOSIS — Z20822 Contact with and (suspected) exposure to covid-19: Secondary | ICD-10-CM

## 2019-06-11 ENCOUNTER — Ambulatory Visit: Payer: Medicare Other | Admitting: Family Medicine

## 2019-06-11 ENCOUNTER — Telehealth: Payer: Medicare Other | Admitting: Family Medicine

## 2019-06-11 ENCOUNTER — Other Ambulatory Visit: Payer: Self-pay

## 2019-06-11 DIAGNOSIS — Z20822 Contact with and (suspected) exposure to covid-19: Secondary | ICD-10-CM

## 2019-06-11 DIAGNOSIS — Z20828 Contact with and (suspected) exposure to other viral communicable diseases: Secondary | ICD-10-CM

## 2019-06-11 LAB — NOVEL CORONAVIRUS, NAA: SARS-CoV-2, NAA: NOT DETECTED

## 2019-06-11 NOTE — Progress Notes (Signed)
Called patient twice left voicemail for her to reach out to [place] in case she still needs an appointment to discuss her labs with [place].  Harolyn Rutherford, DO [facility], PGY-3

## 2019-06-13 ENCOUNTER — Other Ambulatory Visit: Payer: Self-pay

## 2019-06-13 ENCOUNTER — Telehealth (INDEPENDENT_AMBULATORY_CARE_PROVIDER_SITE_OTHER): Payer: Medicare Other | Admitting: Family Medicine

## 2019-06-13 ENCOUNTER — Other Ambulatory Visit: Payer: Medicare Other

## 2019-06-13 VITALS — Wt 215.0 lb

## 2019-06-13 DIAGNOSIS — E0590 Thyrotoxicosis, unspecified without thyrotoxic crisis or storm: Secondary | ICD-10-CM

## 2019-06-13 DIAGNOSIS — R7989 Other specified abnormal findings of blood chemistry: Secondary | ICD-10-CM | POA: Diagnosis not present

## 2019-06-16 NOTE — Assessment & Plan Note (Signed)
Repeat TSH as last one was slightly low, if low again obtain T3 free and T4

## 2019-06-16 NOTE — Progress Notes (Signed)
[place] Telemedicine Visit  Patient consented to have virtual visit. Method of visit: Video  Encounter participants: Patient: [name] - located at home in [facility] Provider: Nuala Alpha - located at [facility] Others (if applicable): none  Chief Complaint: Lab results  HPI: Patient presents today to have follow up for her lab test results. She says she was scheduled to come in before COVID pandemic occurred and she set up an appointment today to find out what her COVID test result was. I informed her it was negative and she has no symptoms of SOB, cough, or difficulty breathing. She also states she was told she needed to have some labs drawn. I looked back at her records and did see where she had an elevated PTH and slightly low TSH and appears she has not been seen in our clinic since.  ROS: per HPI  Pertinent PMHx: Thyromegaly, HTN, HLD, Elevated PTHrP, Osteopenia  Exam:  Gen: NAD, alert Respiratory: speaking in full sentences Skin: no rashes  Assessment/Plan:  Elevated PTHrP level Obtain repeat labs of PTH and Calcium that are overdue due to [place] pandemic - F/u results with patient and have her return to clinic for in person appointment with PCP if still elevated   Subclinical hyperthyroidism Repeat TSH as last one was slightly low, if low again obtain T3 free and T4    Time spent during visit with patient: >10 minutes  Harolyn Rutherford, DO [facility], PGY-3

## 2019-06-16 NOTE — Assessment & Plan Note (Signed)
Obtain repeat labs of PTH and Calcium that are overdue due to [place] pandemic - F/u results with patient and have her return to clinic for in person appointment with PCP if still elevated

## 2019-06-17 ENCOUNTER — Other Ambulatory Visit: Payer: Self-pay

## 2019-06-17 ENCOUNTER — Other Ambulatory Visit: Payer: Medicare Other

## 2019-06-17 DIAGNOSIS — R7989 Other specified abnormal findings of blood chemistry: Secondary | ICD-10-CM

## 2019-06-17 DIAGNOSIS — E0590 Thyrotoxicosis, unspecified without thyrotoxic crisis or storm: Secondary | ICD-10-CM | POA: Diagnosis not present

## 2019-06-18 LAB — PARATHYROID HORMONE, INTACT (NO CA): PTH: 75 pg/mL — ABNORMAL HIGH (ref 15–65)

## 2019-06-18 LAB — TSH: TSH: 0.533 u[IU]/mL (ref 0.450–4.500)

## 2019-06-30 ENCOUNTER — Ambulatory Visit: Payer: Medicare Other

## 2019-07-02 ENCOUNTER — Ambulatory Visit (INDEPENDENT_AMBULATORY_CARE_PROVIDER_SITE_OTHER): Payer: Medicare Other | Admitting: *Deleted

## 2019-07-02 ENCOUNTER — Other Ambulatory Visit: Payer: Self-pay

## 2019-07-02 DIAGNOSIS — Z23 Encounter for immunization: Secondary | ICD-10-CM | POA: Diagnosis not present

## 2019-07-09 ENCOUNTER — Other Ambulatory Visit: Payer: Self-pay | Admitting: Family Medicine

## 2019-07-15 ENCOUNTER — Encounter: Payer: Self-pay | Admitting: Gynecology

## 2019-07-25 ENCOUNTER — Ambulatory Visit (INDEPENDENT_AMBULATORY_CARE_PROVIDER_SITE_OTHER): Payer: Medicare Other | Admitting: Family Medicine

## 2019-07-25 ENCOUNTER — Encounter: Payer: Self-pay | Admitting: Family Medicine

## 2019-07-25 ENCOUNTER — Other Ambulatory Visit: Payer: Self-pay

## 2019-07-25 ENCOUNTER — Ambulatory Visit (HOSPITAL_COMMUNITY)
Admission: RE | Admit: 2019-07-25 | Discharge: 2019-07-25 | Disposition: A | Discharge status: Home / Self Care | Payer: Medicare Other | Source: Ambulatory Visit | Attending: Family Medicine | Admitting: Family Medicine

## 2019-07-25 ENCOUNTER — Encounter (HOSPITAL_COMMUNITY): Payer: Self-pay

## 2019-07-25 VITALS — BP 106/78 | HR 82 | Temp 98.7°F | Wt 219.2 lb

## 2019-07-25 DIAGNOSIS — E213 Hyperparathyroidism, unspecified: Secondary | ICD-10-CM | POA: Insufficient documentation

## 2019-07-25 DIAGNOSIS — S0990XA Unspecified injury of head, initial encounter: Secondary | ICD-10-CM | POA: Diagnosis present

## 2019-07-25 DIAGNOSIS — I10 Essential (primary) hypertension: Secondary | ICD-10-CM

## 2019-07-25 DIAGNOSIS — G44319 Acute post-traumatic headache, not intractable: Secondary | ICD-10-CM

## 2019-07-25 DIAGNOSIS — Z87898 Personal history of other specified conditions: Secondary | ICD-10-CM | POA: Insufficient documentation

## 2019-07-25 DIAGNOSIS — R55 Syncope and collapse: Secondary | ICD-10-CM | POA: Diagnosis not present

## 2019-07-25 HISTORY — DX: Hyperparathyroidism, unspecified: E21.3

## 2019-07-25 NOTE — Assessment & Plan Note (Signed)
Patient had a syncopal event yesterday at her house, details are vague as she does not particularly member how she fell or much about the event or even who helped her up or if she got help getting up.  She did states she hit her head on a door and "dented the door pretty good "  Unknown etiology at this time, head CT was ordered due to trauma to her head and symptoms of loss of balance afterwards but was negative for bleed.  No focal neuro deficits persisting to indicate stroke.  Patient does have hyperparathyroidism work-up pending but that is unlikely to be contributory.  Orthostatics were normal in the office although blood pressure is on the low end with systolics around [id number].  We discussed holding off on her HCTZ for the next week and coming back in to see [place] for recheck on her blood pressure at that time.

## 2019-07-25 NOTE — Assessment & Plan Note (Signed)
Well-controlled with systolics in the 0000000, orthostatics negative after recent syncopal event.  Given potential impact of multiple hypertensive medications, have instructed patient to stop HCTZ for the next week and come back in for recheck to see the impact of this.

## 2019-07-25 NOTE — Assessment & Plan Note (Signed)
Patient had likely syncopal event yesterday, said she fell on flat ground when walking through the house.  Exact details are vague as she does not seem to remember exactly how she fell but she said she did hit her head on a door" dented the door pretty good "she has a minor knot on her head.  She says she is had no focal neuro deficits since then, but does say that 2 or 3 times since then she was walking down the hall she felt herself walking off course to the side although she did not fall again.  She does not remember if she got up on the floor by her self or if her family helped her, there is no family in the room with her to confirm her story.  He has minor headache  Potential concussion versus more significant head trauma/bleed.  No focal neuro deficits to my exam, patient is taking an aspirin every day.  Patient agrees to have her son pick her up so she is not driving and go get a head CT.

## 2019-07-25 NOTE — Assessment & Plan Note (Signed)
Hyperparathyroidism found on prior labs with concurrent hypercalcemia on BMP.  We will pursue further hyperparathyroidism work-up, ionized calcium, magnesium, phosphorus, nuclear medicine parathyroid scan.

## 2019-07-25 NOTE — Progress Notes (Signed)
Subjective:  [name] is a 63 y.o. female who presents to the [facility] today with a chief complaint of fall yesterday with headache.Marland Kitchen   HPI: Syncope/Head injury Patient had a syncopal event yesterday at her house, details are vague as she does not particularly member how she fell or much about the event or even who helped her up or if she got help getting up.  She did states she hit her head on a door and "dented the door pretty good "   she has a minor knot on her head.  She says she is had no focal neuro deficits since then, but does say that 2 or 3 times since then she was walking down the hall she felt herself walking off course to the side although she did not fall again.  She does not remember if she got up on the floor by her self or if her family helped her, there is no family in the room with her to confirm her story.  He has minor headache  Hypercalcemia/Hyperparathyroidism (HCC) Hypercalcemia shown on BMP also in setting of hyperparathyroidism.  Patient with no throat symptoms but did have likely syncopal event yesterday  HYPERTENSION, BENIGN ESSENTIAL Well-controlled with systolics in the 0000000, orthostatics negative after recent syncopal event.   Objective:  Physical Exam: BP 106/78    Pulse 82    Temp 98.7 F (37.1 C) (Oral)    Wt 219 lb 3.2 oz (99.4 kg)    SpO2 96%    BMI 40.75 kg/m   Gen: NAD, conversing comfortable comfortably CV: RRR with no murmurs appreciated Pulm: NWOB, CTAB with no crackles, wheezes, or rhonchi MSK: no edema, cyanosis, or clubbing noted Skin: warm, dry Neuro: grossly normal, moves all extremities, no focal neuro deficits to my cranial nerve exam Psych: Normal affect and thought content, able to appropriately handle discussions of medications and pros and cons but does seem to have memory gaps around the time that she fell. *EKG reviewed with no concerning abnormalities  No results found for this or any previous visit (from the past 72  hour(s)).   Assessment/Plan:  Hypercalcemia Hypercalcemia shown on BMP also in setting of hyperparathyroidism.  Patient with no throat symptoms but did have likely syncopal event yesterday  As part of larger hyperparathyroidism work-up, will order ionized calcium, mag, phosphorus, NM parathyroid scan  Acute post-traumatic headache, not intractable Patient had likely syncopal event yesterday, said she fell on flat ground when walking through the house.  Exact details are vague as she does not seem to remember exactly how she fell but she said she did hit her head on a door" dented the door pretty good "she has a minor knot on her head.  She says she is had no focal neuro deficits since then, but does say that 2 or 3 times since then she was walking down the hall she felt herself walking off course to the side although she did not fall again.  She does not remember if she got up on the floor by her self or if her family helped her, there is no family in the room with her to confirm her story.  He has minor headache  Potential concussion versus more significant head trauma/bleed.  No focal neuro deficits to my exam, patient is taking an aspirin every day.  Patient agrees to have her son pick her up so she is not driving and go get a head CT.  Head injury Patient  had likely syncopal event yesterday, said she fell on flat ground when walking through the house.  Exact details are vague as she does not seem to remember exactly how she fell but she said she did hit her head on a door" dented the door pretty good "she has a minor knot on her head.  She says she is had no focal neuro deficits since then, but does say that 2 or 3 times since then she was walking down the hall she felt herself walking off course to the side although she did not fall again.  She does not remember if she got up on the floor by her self or if her family helped her, there is no family in the room with her to confirm her story.  He  has minor headache  Potential concussion versus more significant head trauma/bleed.  No focal neuro deficits to my exam, patient is taking an aspirin every day.  Patient agrees to have her son pick her up so she is not driving and go get a head CT.  Hyperparathyroidism ([place]) Hyperparathyroidism found on prior labs with concurrent hypercalcemia on BMP.  We will pursue further hyperparathyroidism work-up, ionized calcium, magnesium, phosphorus, nuclear medicine parathyroid scan.  Syncope Patient had a syncopal event yesterday at her house, details are vague as she does not particularly member how she fell or much about the event or even who helped her up or if she got help getting up.  She did states she hit her head on a door and "dented the door pretty good "  Unknown etiology at this time, head CT was ordered due to trauma to her head and symptoms of loss of balance afterwards but was negative for bleed.  No focal neuro deficits persisting to indicate stroke.  Patient does have hyperparathyroidism work-up pending but that is unlikely to be contributory.  Orthostatics were normal in the office although blood pressure is on the low end with systolics around [id number].  We discussed holding off on her HCTZ for the next week and coming back in to see [place] for recheck on her blood pressure at that time.  HYPERTENSION, BENIGN ESSENTIAL Well-controlled with systolics in the 0000000, orthostatics negative after recent syncopal event.  Given potential impact of multiple hypertensive medications, have instructed patient to stop HCTZ for the next week and come back in for recheck to see the impact of this.  *Of note we were able to get CT immediately scheduled at [facility], patient left with her son's driver to go get the CT and then returned for lab work.  Sherene Sires, DO FAMILY MEDICINE RESIDENT - PGY3 07/25/2019 7:10 PM

## 2019-07-25 NOTE — Assessment & Plan Note (Signed)
Hypercalcemia shown on BMP also in setting of hyperparathyroidism.  Patient with no throat symptoms but did have likely syncopal event yesterday  As part of larger hyperparathyroidism work-up, will order ionized calcium, mag, phosphorus, NM parathyroid scan

## 2019-07-27 LAB — COMPREHENSIVE METABOLIC PANEL
ALT: 18 IU/L (ref 0–32)
AST: 17 IU/L (ref 0–40)
Albumin/Globulin Ratio: 1.6 (ref 1.2–2.2)
Albumin: 4.1 g/dL (ref 3.8–4.8)
Alkaline Phosphatase: 116 IU/L (ref 39–117)
BUN/Creatinine Ratio: 10 — ABNORMAL LOW (ref 12–28)
BUN: 8 mg/dL (ref 8–27)
Bilirubin Total: 0.3 mg/dL (ref 0.0–1.2)
CO2: 24 mmol/L (ref 20–29)
Calcium: 10.7 mg/dL — ABNORMAL HIGH (ref 8.7–10.3)
Chloride: 107 mmol/L — ABNORMAL HIGH (ref 96–106)
Creatinine, Ser: 0.8 mg/dL (ref 0.57–1.00)
GFR calc Af Amer: 91 mL/min/{1.73_m2} (ref >59)
GFR calc non Af Amer: 79 mL/min/{1.73_m2} (ref >59)
Globulin, Total: 2.6 g/dL (ref 1.5–4.5)
Glucose: 77 mg/dL (ref 65–99)
Potassium: 4.1 mmol/L (ref 3.5–5.2)
Sodium: 142 mmol/L (ref 134–144)
Total Protein: 6.7 g/dL (ref 6.0–8.5)

## 2019-07-27 LAB — VITAMIN D 25 HYDROXY (VIT D DEFICIENCY, FRACTURES): Vit D, 25-Hydroxy: 8.2 ng/mL — ABNORMAL LOW (ref 30.0–100.0)

## 2019-07-27 LAB — CBC
Hematocrit: 38.5 % (ref 34.0–46.6)
Hemoglobin: 12.5 g/dL (ref 11.1–15.9)
MCH: 27.7 pg (ref 26.6–33.0)
MCHC: 32.5 g/dL (ref 31.5–35.7)
MCV: 85 fL (ref 79–97)
Platelets: 298 10*3/uL (ref 150–450)
RBC: 4.51 x10E6/uL (ref 3.77–5.28)
RDW: 12.7 % (ref 11.7–15.4)
WBC: 7 10*3/uL (ref 3.4–10.8)

## 2019-07-27 LAB — MAGNESIUM: Magnesium: 1.9 mg/dL (ref 1.6–2.3)

## 2019-07-27 LAB — PHOSPHORUS: Phosphorus: 2.6 mg/dL — ABNORMAL LOW (ref 3.0–4.3)

## 2019-07-27 LAB — CALCIUM, IONIZED: Calcium, Ion: 5.9 mg/dL — ABNORMAL HIGH (ref 4.5–5.6)

## 2019-08-01 ENCOUNTER — Other Ambulatory Visit: Payer: Self-pay

## 2019-08-01 ENCOUNTER — Ambulatory Visit (INDEPENDENT_AMBULATORY_CARE_PROVIDER_SITE_OTHER): Payer: Medicare Other | Admitting: Family Medicine

## 2019-08-01 ENCOUNTER — Encounter: Payer: Self-pay | Admitting: Family Medicine

## 2019-08-01 VITALS — BP 140/90 | HR 79 | Ht 62.0 in | Wt 220.0 lb

## 2019-08-01 DIAGNOSIS — R55 Syncope and collapse: Secondary | ICD-10-CM

## 2019-08-01 DIAGNOSIS — G44319 Acute post-traumatic headache, not intractable: Secondary | ICD-10-CM

## 2019-08-01 NOTE — Patient Instructions (Signed)
It was great meeting you today!  I think the next step in your syncope work-up she is to see your cardiologist.  Your history from 4 years ago is a little bit concerning in the context of you passing out about week ago.  We reviewed your CT scan and lab work now looks pretty good.  I do think starting you on some vitamin D3 supplementation will be helpful as your level is quite low.  This may have something to do with your high calcium, low phosphorus, and parathyroid elevation.

## 2019-08-04 ENCOUNTER — Telehealth: Payer: Self-pay | Admitting: Cardiovascular Disease

## 2019-08-04 NOTE — Assessment & Plan Note (Signed)
Resolved at this point

## 2019-08-04 NOTE — Progress Notes (Signed)
   HPI 63 year old female who presents for syncope follow up. Syncopal episode occurred on 10/8. Patient was reportedly walking in her usual state of health when she "fell out" and lost consciousness. Per reports she hit her head but sustained no other trauma. She had no lightheadedness, dizziness, excessive sweating, or any other symptoms to auger her syncopal episode. She woke up and finished her sentence that she was saying before she had her syncopal episode.  The patient did not have a post-ictal state. Did not have any drowsiness, urinary or fecal incontinence, or any other symptom consistent with post-ictal state. Workup consisting of a head CT, cbc  Cmp, phos, vit d. Mg, I cal was performed. Head CT normal, lab workup on consistent with hypercalcemia and vit d deficiency which is a chronic issue. EKG was also performed which was normal aside from what looks like mild LVH. Negative orthostatics.  The patient had an exercise stress test back in 2016 which did show st segment depression. A repeat myoview was performed which was normal. The patient thinks that maybe she had these performed for a "heart issue". Looking back at those notes this was apparently for chest pain.  The patient has not had any further syncopal episodes since 10/9. She feels back to her baseline.  CC: syncopal episode   ROS:   Review of Systems See HPI for ROS.   CC, SH/smoking status, and VS noted  Objective: BP 140/90   Pulse 79   Ht 5\' 2"  (1.575 m)   Wt 220 lb (99.8 kg)   SpO2 97%   BMI 40.24 kg/m  Gen: very pleasant 63 year old AA female, no acute distress HEENT: eomi, perrla, no cervical lymphadenopathy CV: rrr, no m/r/g Resp: lungs clear to auscultation bilaterally, no accessory muscle use Neuro: cn 2-12 intact, no focal neuro deficits, normal gait  Assessment and plan:  Acute post-traumatic headache, not intractable Resolved at this point.  Syncope Broad differential for syncope. Metabolic  causes mostly ruled out given the relatively normal labwork. Her very mild hypercalcemia very unlikely to contribute. Head CT ruling out acute stroke. Symptoms and history not consistent with seizure. Despite the relatively benign EKG, cardiac remains the highest on the differential. Patient with an abnormal exercise stress test back in 2016. She also developed hypotension with exercise on that study. Normal myoview at that time. While this presentation makes an ischemic episode very unlikely I do wonder if patient has an undiagnosed arrhythmia or more improbably an outflow tract problem given the LVH on ekg. The presentation of LVOT issue at this age would be unlikely but I believe the next step in her workup in a cardiology referral for perhaps a 30 day monitor and an echo. Placed referral today. Could consider carotid studies but her presentation is very unlikely for a carotid etiology. - referral placed to cardiology for likely 30 day monitor and echo - could consider carotid duplex if cardiac workup unrevealing   Orders Placed This Encounter  Procedures  . Ambulatory referral to Cardiology    Referral Priority:   Routine    Referral Type:   Consultation    Referral Reason:   Specialty Services Required    Requested Specialty:   Cardiology    Number of Visits Requested:   1    No orders of the defined types were placed in this encounter.    Guadalupe Dawn MD PGY-3 Family Medicine Resident  08/04/2019 11:23 AM

## 2019-08-04 NOTE — Assessment & Plan Note (Signed)
Broad differential for syncope. Metabolic causes mostly ruled out given the relatively normal labwork. Her very mild hypercalcemia very unlikely to contribute. Head CT ruling out acute stroke. Symptoms and history not consistent with seizure. Despite the relatively benign EKG, cardiac remains the highest on the differential. Patient with an abnormal exercise stress test back in 2016. She also developed hypotension with exercise on that study. Normal myoview at that time. While this presentation makes an ischemic episode very unlikely I do wonder if patient has an undiagnosed arrhythmia or more improbably an outflow tract problem given the LVH on ekg. The presentation of LVOT issue at this age would be unlikely but I believe the next step in her workup in a cardiology referral for perhaps a 30 day monitor and an echo. Placed referral today. Could consider carotid studies but her presentation is very unlikely for a carotid etiology. - referral placed to cardiology for likely 30 day monitor and echo - could consider carotid duplex if cardiac workup unrevealing

## 2019-08-04 NOTE — Telephone Encounter (Signed)
LVM for patient to call and schedule a new patient appointment with Dr. Gwenlyn Found.  Has seen Dr. Gwenlyn Found in 2016.

## 2019-08-10 ENCOUNTER — Other Ambulatory Visit: Payer: Self-pay | Admitting: Family Medicine

## 2019-08-10 NOTE — Progress Notes (Signed)
Received notice that patient's parathyroid imaging has been rejected by insurance, we will have our RN team look into authorization because there documentation states that elevated parathyroid hormone is one of the indicators for this test being a preferred imaging.  Dr. Criss Rosales

## 2019-08-14 ENCOUNTER — Telehealth: Payer: Self-pay

## 2019-08-14 ENCOUNTER — Telehealth: Payer: Self-pay | Admitting: *Deleted

## 2019-08-14 MED ORDER — FLUCONAZOLE 150 MG PO TABS: 150.0000 mg | ORAL_TABLET | Freq: Once | ORAL | 0 refills | Status: AC

## 2019-08-14 NOTE — Telephone Encounter (Signed)
Patient aware, Rx sent.  

## 2019-08-14 NOTE — Telephone Encounter (Signed)
Agree with Diflucan 

## 2019-08-14 NOTE — Telephone Encounter (Signed)
Pt calling for Dr. Kris Mouton. She came in on Oct 16. Pt was told to stop HCTZ. Pt wants to make sure she should not start taking the HCTZ again. Wasn't clear if she was only stopping the medication for a little while. Pt can be reached at [phone]. Ottis Stain, CMA

## 2019-08-14 NOTE — Telephone Encounter (Signed)
Patient called c/o vaginal itching asked if diflucan tablet can be sent?

## 2019-09-02 NOTE — Telephone Encounter (Signed)
Pt calls to check status.  Apologized for delay.  Spoke with Dr. Kris Mouton, he will review chart and we will call pt back today.  Christen Bame, CMA

## 2019-09-02 NOTE — Telephone Encounter (Signed)
Called pt back and made appt for Thursday AM in [facility]C. (Dr. Shan Levans). Christen Bame, CMA

## 2019-09-02 NOTE — Telephone Encounter (Signed)
Per chart review Dr. Criss Rosales saw patient in October and informed her to stop HCTZ. Instructions were as follows "Given potential impact of multiple hypertensive medications, have instructed patient to stop HCTZ for the next week and come back in for recheck to see the impact of this."  From chart review, patient should come back in for in person visit to evaluate this and have BP re-checked. Please schedule patient. This can be in ATC as I do not have clinic slots this week.   Dalphine Handing, PGY-3 [facility] 09/02/2019 2:49 PM

## 2019-09-04 ENCOUNTER — Other Ambulatory Visit: Payer: Self-pay

## 2019-09-04 ENCOUNTER — Ambulatory Visit (INDEPENDENT_AMBULATORY_CARE_PROVIDER_SITE_OTHER): Payer: Medicare Other | Admitting: Family Medicine

## 2019-09-04 DIAGNOSIS — I10 Essential (primary) hypertension: Secondary | ICD-10-CM

## 2019-09-04 DIAGNOSIS — G44319 Acute post-traumatic headache, not intractable: Secondary | ICD-10-CM | POA: Diagnosis not present

## 2019-09-04 MED ORDER — AMLODIPINE BESYLATE 10 MG PO TABS: 10.0000 mg | ORAL_TABLET | Freq: Every day | ORAL | 3 refills | Status: DC

## 2019-09-04 NOTE — Progress Notes (Signed)
   Subjective:    [name] - 63 y.o. female MRN [id number]  Date of birth: 1956/08/23  CC:  [name] is here for follow up of hypertension and posttraumatic headache.  HPI: Patient reports that she has been having headaches since she fell about one month ago, although they do seem to be lessening in frequency.  During her fall, she lost consciousness after getting up from a seated position.  She remembers a prodrome of feeling "out of it" and then remembers getting up from the floor.  She can't remember if she felt lightheaded before she had syncope.  She hit her head during the fall, but a CT head did not show acute abnormalities.  She reports that her memory is a little worse than before her fall, although she thinks that this is improving as well.  Her HCTZ was stopped after this fall due to concerns for orthostatic hypotension.  She denies blurry vision, nausea, flank pain since stopping this medication.  She continues to take Lopressor 50 mg twice daily.  She plans to see her cardiologist next week.  Health Maintenance:  There are no preventive care reminders to display for this patient.  -  reports that she has never smoked. She has never used smokeless tobacco. - Review of Systems: Per HPI. - Past Medical History: Patient Active Problem List   Diagnosis Date Noted  . Syncope 07/25/2019  . Acute post-traumatic headache, not intractable 07/25/2019  . Hypercalcemia 07/25/2019  . Hyperparathyroidism ([place]) 07/25/2019  . Neuropathic pain 06/19/2018  . Other cervical disc degeneration, unspecified cervical region 06/19/2018  . Elevated PTHrP level 06/19/2018  . Stress 02/22/2018  . Postmenopausal bleeding 11/30/2017  . Keratosis pilaris 04/12/2016  . OAB (overactive bladder) 09/01/2015  . 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 Hip arthritis 10/01/2013  . UNSPECIFIED EPISODIC MOOD DISORDER 01/04/2010  . THYROMEGALY 12/30/2009  . Hyperlipidemia 11/09/2008  . Subclinical  hyperthyroidism 05/08/2007  . HYPERTENSION, BENIGN ESSENTIAL 01/15/2007  . Obesity 12/13/2006   - Medications: reviewed and updated   Objective:   Physical Exam BP (!) 162/100   Pulse 67   Wt 218 lb 6.4 oz (99.1 kg)   SpO2 98%   BMI 39.95 kg/m  Gen: NAD, alert, cooperative with exam, well-appearing, pleasant CV: RRR, good S1/S2, no murmur, no edema, no pedal edema Resp: CTABL, no wheezes, non-labored Neuro: no gross deficits.  Psych: good insight, alert and oriented  Orthostatic vitals: Supine: BP 160/100, pulse 65 Sitting: BP 158/98, pulse 74 Standing at 0 minutes: BP 158/98, pulse 68 Standing after 3 minutes: BP 152/100, pulse 69      Assessment & Plan:   HYPERTENSION, BENIGN ESSENTIAL Due to significantly elevated blood pressure today, we will start amlodipine 10 mg nightly.  Patient is amenable to trying another medication rather than HCTZ after discussion.  It is possible that her Lopressor could have contributed to bradycardia or hypotension leading to her fall, but since her pulse is within normal limits, we will continue this medication for now.    Acute post-traumatic headache, not intractable Patient likely sustained a concussion due to her fall, which has likely been the source of her headaches and memory difficulties.  Reassuring that both of these symptoms are subsiding and head CT was negative for intracranial abnormality.  Patient was encouraged to make an appointment if her headaches and memory difficulties do not continue to improve.    Maia Breslow, M.D. 09/05/2019, 9:40 AM PGY-3, [place]

## 2019-09-04 NOTE — Patient Instructions (Signed)
It was nice meeting you today Ms. Yearian!  Today, we are starting a new blood pressure medicine called amlodipine.  You can take this once every night.  Please let us know if you feel lightheaded or feel like you are about to faint.  Your cardiologist may want to decrease your dose of metoprolol, but I will leave it the same since your heart rate is okay today.  You may have suffered a concussion during your fall, which is causing your headaches and difficulties with memory.  I am glad this is improving.  Please use Tylenol before using ibuprofen for your headaches.  If you have any questions or concerns, please feel free to call the clinic.   Be well,  Dr. Shan Levans

## 2019-09-05 NOTE — Assessment & Plan Note (Signed)
Due to significantly elevated blood pressure today, we will start amlodipine 10 mg nightly.  Patient is amenable to trying another medication rather than HCTZ after discussion.  It is possible that her Lopressor could have contributed to bradycardia or hypotension leading to her fall, but since her pulse is within normal limits, we will continue this medication for now.

## 2019-09-05 NOTE — Assessment & Plan Note (Signed)
Patient likely sustained a concussion due to her fall, which has likely been the source of her headaches and memory difficulties.  Reassuring that both of these symptoms are subsiding and head CT was negative for intracranial abnormality.  Patient was encouraged to make an appointment if her headaches and memory difficulties do not continue to improve.

## 2019-09-08 ENCOUNTER — Ambulatory Visit (INDEPENDENT_AMBULATORY_CARE_PROVIDER_SITE_OTHER): Payer: Medicare Other | Admitting: Cardiovascular Disease

## 2019-09-08 ENCOUNTER — Other Ambulatory Visit: Payer: Self-pay

## 2019-09-08 ENCOUNTER — Encounter: Payer: Self-pay | Admitting: Cardiovascular Disease

## 2019-09-08 VITALS — BP 140/88 | HR 81 | Ht 62.0 in | Wt 218.8 lb

## 2019-09-08 DIAGNOSIS — R55 Syncope and collapse: Secondary | ICD-10-CM

## 2019-09-08 DIAGNOSIS — I10 Essential (primary) hypertension: Secondary | ICD-10-CM | POA: Diagnosis not present

## 2019-09-08 LAB — LIPID PANEL
Chol/HDL Ratio: 3.7 ratio (ref 0.0–4.4)
Cholesterol, Total: 228 mg/dL — ABNORMAL HIGH (ref 100–199)
HDL: 62 mg/dL (ref >39)
LDL Chol Calc (NIH): 147 mg/dL — ABNORMAL HIGH (ref 0–99)
Triglycerides: 109 mg/dL (ref 0–149)
VLDL Cholesterol Cal: 19 mg/dL (ref 5–40)

## 2019-09-08 LAB — HEPATIC FUNCTION PANEL
ALT: 17 IU/L (ref 0–32)
AST: 17 IU/L (ref 0–40)
Albumin: 4.3 g/dL (ref 3.8–4.8)
Alkaline Phosphatase: 131 IU/L — ABNORMAL HIGH (ref 39–117)
Bilirubin Total: 0.3 mg/dL (ref 0.0–1.2)
Bilirubin, Direct: 0.09 mg/dL (ref 0.00–0.40)
Total Protein: 7.1 g/dL (ref 6.0–8.5)

## 2019-09-08 NOTE — Assessment & Plan Note (Signed)
[name] saw me initially back in 2016 for atypical chest pain and had a Myoview stress test that was normal.  She said no recurrent symptoms.

## 2019-09-08 NOTE — Progress Notes (Signed)
09/08/2019 [name]   [birth year]  [id number]  Primary Physician Caroline More, DO Primary Cardiologist: Lorretta Harp MD [name], [place]  HPI:  [name] is a 63 y.o.  severely overweight married African-American female mother of 3 children, and mother of one grandchild who worked as a Clinical biochemist at [employer]. She was referred by her primary care physician at [facility] for evaluation of chest pain.  I last saw her in the office 08/18/2015.  With factors include treated hypertension. She has mild untreated hyperlipidemia. She has never smoked. There is no family history. She has never had a heart attack or stroke. She relates the onset of chest pain approximately 3 months prior to her office visit occurring several times a month lasting for seconds at a time without associated symptoms other than infrequent right upper extremity radiation.  I performed Myoview stress testing which was entirely normal.  Her symptoms subsequently resolved.  Since I saw her 4 years ago she is done well until 6 weeks ago when she had an episode of syncope while at a daycare center.  She suddenly lost consciousness, hit the ground and injured her head.  She said no recurrent symptoms.  She denies chest pain or shortness of breath.   Current Meds  Medication Sig  . albuterol (PROVENTIL HFA) 108 (90 Base) MCG/ACT inhaler INHALE TWO PUFFS BY MOUTH INTO THE LUNGS EVERY 6 HOURS AS NEEDED FOR WHEEZING OR SHORTNESS OF BREATH  . aspirin 325 MG EC tablet Take 325 mg by mouth daily.  . capsaicin (ZOSTRIX) 0.025 % cream Apply topically 2 (two) times daily.  . fluticasone (FLONASE) 50 MCG/ACT nasal spray USE ONE SPRAY(S) IN EACH NOSTRIL TWICE DAILY  . gabapentin (NEURONTIN) 100 MG capsule TAKE 1 CAPSULE BY MOUTH DAILY  . loratadine (EQ ALLERGY RELIEF) 10 MG tablet Take 1 tablet (10 mg total) by mouth daily.  . metoprolol tartrate (LOPRESSOR) 50 MG tablet Take 1 tablet (50 mg total) by  mouth 2 (two) times daily.     Allergies  Allergen Reactions  . Hydrocodone     Mood disturbance  . Simvastatin     REACTION: muscle aches, GI complaints    Social History   Socioeconomic History  . Marital status: Divorced    Spouse name: Not on file  . Number of children: Not on file  . Years of education: Not on file  . Highest education level: Not on file  Occupational History  . 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 Financial resource strain: Not on file  . Food insecurity    Worry: Not on file    Inability: Not on file  . Transportation needs    Medical: Not on file    Non-medical: Not on file  Tobacco Use  . Smoking status: Never Smoker  . Smokeless tobacco: Never Used  Substance and Sexual Activity  . Alcohol use: No    Alcohol/week: 0.0 standard drinks  . Drug use: No  . 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 Physical activity    Days per week: Not on file    Minutes per session: Not on file  . Stress: Not on file  Relationships  .  Social Herbalist on phone: Not on file    Gets together: Not on file    Attends religious service: Not on file    Active member of club or organization: Not on file    Attends meetings of clubs or organizations: Not on file    Relationship status: Not on file  . Intimate partner violence    Fear of current or ex partner: Not on file    Emotionally abused: Not on file    Physically abused: Not on file    Forced sexual activity: Not on file  Other Topics Concern  . Not on file  Social History Narrative  . Not on file     Review of Systems: General: negative for chills, fever, night sweats or weight changes.  Cardiovascular: negative for chest pain, dyspnea on exertion, edema, orthopnea, palpitations,  paroxysmal nocturnal dyspnea or shortness of breath Dermatological: negative for rash Respiratory: negative for cough or wheezing Urologic: negative for hematuria Abdominal: negative for nausea, vomiting, diarrhea, bright red blood per rectum, melena, or hematemesis Neurologic: negative for visual changes, syncope, or dizziness All other systems reviewed and are otherwise negative except as noted above.    Blood pressure 140/88, pulse 81, height 5\' 2"  (1.575 m), weight 218 lb 12.8 oz (99.2 kg), SpO2 97 %.  General appearance: alert and no distress Neck: no adenopathy, no carotid bruit, no JVD, supple, symmetrical, trachea midline and thyroid not enlarged, symmetric, no tenderness/mass/nodules Lungs: clear to auscultation bilaterally Heart: regular rate and rhythm, S1, S2 normal, no murmur, click, rub or gallop Extremities: extremities normal, atraumatic, no cyanosis or edema Pulses: 2+ and symmetric Skin: Skin color, texture, turgor normal. No rashes or lesions Neurologic: Alert and oriented X 3, normal strength and tone. Normal symmetric reflexes. Normal coordination and gait  EKG not performed today  ASSESSMENT AND PLAN:   HYPERTENSION, BENIGN ESSENTIAL History of essential hypertension with blood pressure measured today 140/88.  She is on metoprolol.  Hyperlipidemia History of mild hyperlipidemia followed by her PCP.  We will recheck a lipid liver profile today  Elevated PTHrP level [name] had an episode of syncope 6 weeks ago.  She was at a daycare center, suddenly lost consciousness, hit the ground and injured her head.  She said no recurrent episodes.  I am going to get a 30-day event monitor and 2D echocardiogram.  I did tell her that according to [facility], she cannot drive for 6 months.  Atypical chest pain [name] saw me initially back in 2016 for atypical chest pain and had a Myoview stress test that was normal.  She said no recurrent symptoms.       Lorretta Harp MD FACP,FACC,FAHA, [facility] 09/08/2019 9:38 AM

## 2019-09-08 NOTE — Assessment & Plan Note (Signed)
History of mild hyperlipidemia followed by her PCP.  We will recheck a lipid liver profile today

## 2019-09-08 NOTE — Assessment & Plan Note (Signed)
History of essential hypertension with blood pressure measured today 140/88.  She is on metoprolol.

## 2019-09-08 NOTE — Assessment & Plan Note (Signed)
[name] had an episode of syncope 6 weeks ago.  She was at a daycare center, suddenly lost consciousness, hit the ground and injured her head.  She said no recurrent episodes.  I am going to get a 30-day event monitor and 2D echocardiogram.  I did tell her that according to [facility], she cannot drive for 6 months.

## 2019-09-08 NOTE — Patient Instructions (Signed)
Medication Instructions:  Your physician recommends that you continue on your current medications as directed. Please refer to the Current Medication list given to you today.  If you need a refill on your cardiac medications before your next appointment, please call your pharmacy.   Lab work: Lipid and Hepatic function If you have labs (blood work) drawn today and your tests are completely normal, you will receive your results only by: MyChart Message (if you have MyChart) OR A paper copy in the mail If you have any lab test that is abnormal or we need to change your treatment, we will call you to review the results.  Testing/Procedures: Your physician has requested that you have an echocardiogram. Echocardiography is a painless test that uses sound waves to create images of your heart. It provides your doctor with information about the size and shape of your heart and how well your heart's chambers and valves are working. This procedure takes approximately one hour. There are no restrictions for this procedure. [place] has recommended that you wear a 30 day event monitor. Event monitors are medical devices that record the heart's electrical activity. Doctors most often [place] these monitors to diagnose arrhythmias. Arrhythmias are problems with the speed or rhythm of the heartbeat. The monitor is a small, portable device. You can wear one while you do your normal daily activities. This is usually used to diagnose what is causing palpitations/syncope (passing out).  Follow-Up: At [facility], you and your health needs are our priority.  As part of our continuing mission to provide you with exceptional heart care, we have created designated Provider Care Teams.  These Care Teams include your primary Cardiologist (physician) and Advanced Practice Providers (APPs -  Physician Assistants and Nurse Practitioners) who all work together to provide you with the care  you need, when you need it. You may see Dr Gwenlyn Found or one of the following Advanced Practice Providers on your designated Care Team:    Kerin Ransom, PA-C  [place], [place]  Coletta Memos, [place]   Your physician wants you to follow-up in: 6 months with an PA and 1 year with Dr Gwenlyn Found. You will receive a reminder letter in the mail two months in advance. If you don't receive a letter, please call our office to schedule the follow-up appointment.  Any Other Special Instructions Will Be Listed Below (If Applicable).  [employer] Cardiac Event Monitor Instructions Your physician has requested you wear your cardiac event monitor for _____ days, (1-30). [employer] may call or text to confirm a shipping address. The monitor will be sent to a land address via UPS. [employer] will not ship a monitor to a PO BOX. It typically takes 3-5 days to receive your monitor after it has been enrolled. [employer] will assist with USPS tracking if your package is delayed. The telephone number for [employer] is [phone]. Once you have received your monitor, please review the enclosed instructions. Instruction tutorials can also be viewed under help and settings on the enclosed cell phone. Your monitor has already been registered assigning a specific monitor serial # to you.  Applying the monitor Remove cell phone from case and turn it on. The cell phone works as Dealer and needs to be within [employer] of you at all times. The cell phone will need to be charged on a daily basis. We recommend you plug the cell phone into the enclosed charger at your bedside table every night.  Monitor batteries: You will receive two monitor batteries labelled #1 and #2. These are your recorders. Plug battery #2 onto the second connection on the enclosed charger. Keep one battery on the charger at all times. This will keep the monitor battery deactivated. It will also keep it fully charged for when you need to  switch your monitor batteries. A small light will be blinking on the battery emblem when it is charging. The light on the battery emblem will remain on when the battery is fully charged.  Open package of a Monitor strip. Insert battery #1 into black hood on strip and gently squeeze monitor battery onto connection as indicated in instruction booklet. Set aside while preparing skin.  Choose location for your strip, vertical or horizontal, as indicated in the instruction booklet. Shave to remove all hair from location. There cannot be any lotions, oils, powders, or colognes on skin where monitor is to be applied. Wipe skin clean with enclosed Saline wipe. Dry skin completely.  Peel paper labeled #1 off the back of the Monitor strip exposing the adhesive. Place the monitor on the chest in the vertical or horizontal position shown in the instruction booklet. One arrow on the monitor strip must be pointing upward. Carefully remove paper labeled #2, attaching remainder of strip to your skin. Try not to create any folds or wrinkles in the strip as you apply it.  Firmly press and release the circle in the center of the monitor battery. You will hear a small beep. This is turning the monitor battery on. The heart emblem on the monitor battery will light up every 5 seconds if the monitor battery in turned on and connected to the patient securely. Do not push and hold the circle down as this turns the monitor battery off. The cell phone will locate the monitor battery. A screen will appear on the cell phone checking the connection of your monitor strip. This may read poor connection initially but change to good connection within the next minute. Once your monitor accepts the connection you will hear a series of 3 beeps followed by a climbing crescendo of beeps. A screen will appear on the cell phone showing the two monitor strip placement options. Touch the picture that demonstrates where you applied the  monitor strip.  Your monitor strip and battery are waterproof. You are able to shower, bathe, or swim with the monitor on. They just ask you do not submerge deeper than 3 feet underwater. We recommend removing the monitor if you are swimming in a lake, river, or ocean.  Your monitor battery will need to be switched to a fully charged monitor battery approximately once a week. The cell phone will alert you of an action which needs to be made.  On the cell phone, tap for details to reveal connection status, monitor battery status, and cell phone battery status. The green dots indicates your monitor is in good status. A red dot indicates there is something that needs your attention.  To record a symptom, click the circle on the monitor battery. In 30-60 seconds a list of symptoms will appear on the cell phone. Select your symptom and tap save. Your monitor will record a sustained or significant arrhythmia regardless of you clicking the button. Some patients do not feel the heart rhythm irregularities. [employer] will notify us of any serious or critical events.  Refer to instruction booklet for instructions on switching batteries, changing strips, the Do not disturb or Pause features, or any  additional questions.  Call [employer] at [phone], to confirm your monitor is transmitting and record your baseline. They will answer any questions you may have regarding the monitor instructions at that time.  Returning the monitor to [place] all equipment back into blue box. Peel off strip of paper to expose adhesive and close box securely. There is a prepaid UPS shipping label on this box. Drop in a UPS drop box, or at a UPS facility like [employer]. You may also contact [employer] to arrange UPS to pick up monitor package at your home.

## 2019-09-15 ENCOUNTER — Encounter: Payer: Self-pay | Admitting: Gastroenterology

## 2019-09-16 ENCOUNTER — Telehealth: Payer: Self-pay | Admitting: Family Medicine

## 2019-09-16 NOTE — Telephone Encounter (Signed)
Daughter dropped of disability forms for the doctor to fill out for her mother. Forms placed in the Red team folder. jw

## 2019-09-17 ENCOUNTER — Telehealth: Payer: Self-pay | Admitting: *Deleted

## 2019-09-17 ENCOUNTER — Telehealth: Payer: Self-pay | Admitting: Radiology

## 2019-09-17 NOTE — Telephone Encounter (Addendum)
Left message for pt to call   ----- Message from Lorretta Harp, MD sent at 09/09/2019  1:26 PM EST ----- Not at goal for primary prevention.  Start Livalo 2 mg a day along with co-Q10 200 mg a day and recheck in 2 months

## 2019-09-17 NOTE — Telephone Encounter (Signed)
Enrolled patient for a 30 day [employer] event monitor to be mailed.

## 2019-09-17 NOTE — Telephone Encounter (Signed)
Reviewed form and placed in PCP's box for completion.  .Michelle R Simpson, CMA  

## 2019-09-19 ENCOUNTER — Ambulatory Visit (HOSPITAL_COMMUNITY): Payer: Medicare Other | Attending: Cardiology

## 2019-09-19 ENCOUNTER — Other Ambulatory Visit: Payer: Self-pay

## 2019-09-19 DIAGNOSIS — R55 Syncope and collapse: Secondary | ICD-10-CM | POA: Diagnosis present

## 2019-09-19 NOTE — Telephone Encounter (Signed)
Spoke with pt, she does not want to try statins because of having problems with them in the past. She is willing to talk with the pharmacist regarding medications and cholesterol. Follow up scheduled with pharm md.

## 2019-09-19 NOTE — Telephone Encounter (Signed)
Pt is calling to check on the status of form being completed. I told her we had the paperwork and it was placed in Dr. Marilynne Drivers box to be completed. She asked me to remind her that the form is due 12/07.   I also reminded the pt of out 7 day policy for form completion.

## 2019-09-19 NOTE — Telephone Encounter (Signed)
Patient is calling to give [place] the fax number for the/this form she needs faxed today, it is [phone].

## 2019-09-21 NOTE — Telephone Encounter (Signed)
Reviewed paperwork. Patient is requesting long term disability. Unfortunately we do not provide this paperwork.   I attempted to call patient to explain this but she did not pick up.  Please call patient and inform her we will not be able to fill this out. She will need to call social security office as this is a separate physical exam by a doctor who specializes in this and they can fill out long term disability forms. I can only fill out short term forms such as FMLA.   Thank you for reminding patient of 7 day policy as I only received paperwork on 09/17/19   Caroline More, DO, PGY-3 [facility] 09/21/2019 7:00 PM

## 2019-09-22 NOTE — Telephone Encounter (Signed)
Attempted to call patient both at mobile and home phone listed, no answer.  Voicemail left to call clinic back.  Called phone number that was on contact number on form completion request.  This was her daughter's cell phone number.  Daughter was the one he dropped off form and states she will be the one to pick it up.  I explained to daughter that we do not complete disability forms in the office.  I explained to her that she will need to call us a security office to set up an appointment to get this filled out.  Daughter was appreciative of call and stated her mother actually told her earlier today that that is what would need to happen.  Daughter will pick up form tomorrow.  Will leave form in front desk.  Dalphine Handing, PGY-3 [facility] 09/22/2019 4:11 PM

## 2019-09-22 NOTE — Telephone Encounter (Signed)
Pt would like to come back to the office and pick up this paperwork. She would also like to know who she should call about getting this form filled out j w

## 2019-09-24 ENCOUNTER — Encounter (INDEPENDENT_AMBULATORY_CARE_PROVIDER_SITE_OTHER): Payer: Medicare Other

## 2019-09-24 ENCOUNTER — Encounter: Payer: Self-pay | Admitting: Cardiovascular Disease

## 2019-09-24 DIAGNOSIS — R55 Syncope and collapse: Secondary | ICD-10-CM | POA: Diagnosis not present

## 2019-09-28 ENCOUNTER — Telehealth: Payer: Self-pay | Admitting: Cardiology

## 2019-09-28 NOTE — Telephone Encounter (Signed)
Called by [employer] in regards to patient activating event monitor for syncope.  Underlying rhythm is NSR with no arrhythmias.  Attempted to call patient but could not get patient to answer phone.

## 2019-09-29 ENCOUNTER — Telehealth: Payer: Self-pay

## 2019-09-29 ENCOUNTER — Encounter: Payer: Self-pay | Admitting: Gastroenterology

## 2019-09-29 ENCOUNTER — Other Ambulatory Visit: Payer: Self-pay | Admitting: Family Medicine

## 2019-09-29 DIAGNOSIS — Z1231 Encounter for screening mammogram for malignant neoplasm of breast: Secondary | ICD-10-CM

## 2019-09-29 NOTE — Telephone Encounter (Signed)
Called pt re: monitor strips brought to triage from 09/18/19 12:49 am... pt had pushed the button accidentally... monitor showed NSR with R75... Day 5 of 30.. pt reports she has not had presyncope or syncope. Will continue to monitor.

## 2019-10-14 ENCOUNTER — Telehealth: Payer: Self-pay | Admitting: *Deleted

## 2019-10-14 NOTE — Telephone Encounter (Signed)
Pt no showed 330 pm PV Called pt at 344 pm- no answer, LM to return call by 5 pm today to RS  PV or the PV and Colon 1-12 would both be canceled and then she could CB to RS both

## 2019-10-14 NOTE — Telephone Encounter (Signed)
Pt did not call to RS PV by 530 pm Mailed NS letter Canceled PV and Colon

## 2019-10-23 ENCOUNTER — Telehealth: Payer: Self-pay | Admitting: *Deleted

## 2019-10-23 MED ORDER — NA SULFATE-K SULFATE-MG SULF 17.5-3.13-1.6 GM/177ML PO SOLN: 1.0000 | Freq: Once | ORAL | 0 refills | Status: AC

## 2019-10-23 NOTE — Telephone Encounter (Signed)
Virtual pre-visit completed with patient.  Copies of instructions mailed to patient.

## 2019-10-24 ENCOUNTER — Other Ambulatory Visit: Payer: Self-pay

## 2019-10-24 ENCOUNTER — Ambulatory Visit (AMBULATORY_SURGERY_CENTER): Payer: Medicare Other | Admitting: *Deleted

## 2019-10-24 DIAGNOSIS — Z86010 Personal history of colonic polyps: Secondary | ICD-10-CM

## 2019-10-24 DIAGNOSIS — Z1159 Encounter for screening for other viral diseases: Secondary | ICD-10-CM

## 2019-10-24 MED ORDER — NA SULFATE-K SULFATE-MG SULF 17.5-3.13-1.6 GM/177ML PO SOLN: 1.0000 | Freq: Once | ORAL | 0 refills | Status: AC

## 2019-10-24 MED ORDER — NA SULFATE-K SULFATE-MG SULF 17.5-3.13-1.6 GM/177ML PO SOLN: 1.0000 | Freq: Once | ORAL | 0 refills | Status: DC

## 2019-10-24 NOTE — Progress Notes (Signed)

## 2019-10-27 ENCOUNTER — Encounter: Payer: Self-pay | Admitting: Gastroenterology

## 2019-10-28 ENCOUNTER — Ambulatory Visit (INDEPENDENT_AMBULATORY_CARE_PROVIDER_SITE_OTHER): Payer: Medicare Other

## 2019-10-28 ENCOUNTER — Encounter: Payer: Medicare Other | Admitting: Gastroenterology

## 2019-10-28 DIAGNOSIS — Z1159 Encounter for screening for other viral diseases: Secondary | ICD-10-CM

## 2019-10-29 LAB — SARS CORONAVIRUS 2 (TAT 6-24 HRS): SARS Coronavirus 2: NEGATIVE

## 2019-10-30 ENCOUNTER — Ambulatory Visit (INDEPENDENT_AMBULATORY_CARE_PROVIDER_SITE_OTHER): Payer: Medicare Other | Admitting: Pharmacist

## 2019-10-30 ENCOUNTER — Encounter: Payer: Self-pay | Admitting: Family Medicine

## 2019-10-30 ENCOUNTER — Ambulatory Visit: Payer: Medicare Other | Admitting: Family Medicine

## 2019-10-30 ENCOUNTER — Ambulatory Visit (INDEPENDENT_AMBULATORY_CARE_PROVIDER_SITE_OTHER): Payer: Medicare Other | Admitting: Family Medicine

## 2019-10-30 ENCOUNTER — Other Ambulatory Visit: Payer: Self-pay

## 2019-10-30 VITALS — BP 164/90 | HR 51 | Wt 216.8 lb

## 2019-10-30 VITALS — BP 138/86 | HR 76 | Resp 16 | Ht 62.0 in | Wt 216.6 lb

## 2019-10-30 DIAGNOSIS — R1013 Epigastric pain: Secondary | ICD-10-CM

## 2019-10-30 DIAGNOSIS — G4489 Other headache syndrome: Secondary | ICD-10-CM

## 2019-10-30 DIAGNOSIS — R1011 Right upper quadrant pain: Secondary | ICD-10-CM | POA: Insufficient documentation

## 2019-10-30 DIAGNOSIS — R519 Headache, unspecified: Secondary | ICD-10-CM | POA: Insufficient documentation

## 2019-10-30 DIAGNOSIS — E785 Hyperlipidemia, unspecified: Secondary | ICD-10-CM

## 2019-10-30 MED ORDER — ROSUVASTATIN CALCIUM 5 MG PO TABS: ORAL_TABLET | ORAL | 1 refills | Status: DC

## 2019-10-30 NOTE — Progress Notes (Signed)
Patient ID: [name]                 DOB: 08-Jul-1956                    MRN: [id number]     HPI: [name] is a 64 y.o. female patient referred to lipid clinic by Dr Gwenlyn Found . PMH is significant for chest pain, hypertension, mild-untreated hyperlipidemia. Patient presents for medication management after inability to tolerate 2 different statins.  Current Medications: none  Intolerance: Pravastatin 40mg  daily - hallucinations Simvastatin 20mg  daily - hallucinations  LDL goal: < 100mg /dL  Diet: likes salt, eats out a lot, hamburgers ,fried fish, and fried chicken  Exercise: activities of daily living  Family History: no hyperlipidemia or MI in family  Social History: denies tobacco and alcohol  Labs: 09/08/2019: CHO 228; TG 109; HDL 62; LDL-c 147   Past Medical History:  Diagnosis Date  . Allergy   . Anxiety   . 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 Carpal tunnel syndrome, bilateral   . Chest pain   . Heart murmur    age 64 said had heart murmur.  . Hyperlipidemia   . Hypertension   . Normal cardiac stress test    Myoview stress test  . Osteopenia 05/2018   T score -1.1 FRAX 2.5%/ 0.1%    Current Outpatient Medications on File Prior to Visit  Medication Sig Dispense Refill  . albuterol (PROVENTIL HFA) 108 (90 Base) MCG/ACT inhaler INHALE TWO PUFFS BY MOUTH INTO THE LUNGS EVERY 6 HOURS AS NEEDED FOR WHEEZING OR SHORTNESS OF BREATH (Patient not taking: Reported on 10/31/2019) 7 each 1  . aspirin 325 MG EC tablet Take 325 mg by mouth daily.    . capsaicin (ZOSTRIX) 0.025 % cream Apply topically 2 (two) times daily. (Patient not taking: Reported on 10/31/2019) 60 g 0  . fluticasone (FLONASE) 50 MCG/ACT nasal spray USE ONE SPRAY(S) IN EACH NOSTRIL TWICE DAILY (Patient not taking: Reported on 10/31/2019) 16 g 6  . gabapentin (NEURONTIN) 100 MG capsule TAKE 1 CAPSULE BY MOUTH DAILY (Patient not taking: Reported on 10/31/2019) 30 capsule 0  . loratadine (EQ  ALLERGY RELIEF) 10 MG tablet Take 1 tablet (10 mg total) by mouth daily. 90 tablet 3  . metoprolol tartrate (LOPRESSOR) 50 MG tablet Take 1 tablet (50 mg total) by mouth 2 (two) times daily. 180 tablet 3   No current facility-administered medications on file prior to visit.    Allergies  Allergen Reactions  . Hydrocodone     Mood disturbance  . Simvastatin     REACTION: muscle aches, GI complaints    Hyperlipidemia LDL remains above goal for primary prevention. Patient was unable to tolerate simvastatin and pravastatin in the past d/t hallucinations.  Admit having a "terrible" diet and eating out most days.   Will start low dose rosuvastatin at 5mg  every Monday and Friday, work on positive lifestyle modifications, and repeat fasting blood work in 2 months. Patient was encouraged to call back is she developed problems with rosuvastatin.    [name] PharmD, BCPS, [place] [id number] 10/31/2019 3:15 PM

## 2019-10-30 NOTE — Patient Instructions (Signed)
It was a pleasure seeing you today.   Today we discussed your headaches and RUQ pain  For your RUQ pain: I have ordered an ultrasound. I have ordered blood work as well.   For your headaches: please keep a headache journal. Record what was happening right before and right after the headaches. Please go to the emergency room if you have worsening headaches, new vision changes, or weakness.   Please follow up in 2 weeks or sooner if symptoms persist or worsen. Please call the clinic immediately if you have any concerns.   Our clinic's number is [phone]. Please call with questions or concerns.   Thank you,  Caroline More, DO

## 2019-10-30 NOTE — Patient Instructions (Addendum)
Lipid Clinic (pharmacist) Chadric Kimberley/Kristin [phone]  *START taking rosuvastatin 5mg  every Monday and Friday ONLY* *Repeat fasting blood work in 2 months* *Work on lifestyle modification - increase physical activity and decrease fat/salt in diet   High Cholesterol  High cholesterol is a condition in which the blood has high levels of a white, waxy, fat-like substance (cholesterol). The human body needs small amounts of cholesterol. The liver makes all the cholesterol that the body needs. Extra (excess) cholesterol comes from the food that we eat. Cholesterol is carried from the liver by the blood through the blood vessels. If you have high cholesterol, deposits (plaques) may build up on the walls of your blood vessels (arteries). Plaques make the arteries narrower and stiffer. Cholesterol plaques increase your risk for heart attack and stroke. Work with your health care provider to keep your cholesterol levels in a healthy range. What increases the risk? This condition is more likely to develop in people who:  Eat foods that are high in animal fat (saturated fat) or cholesterol.  Are overweight.  Are not getting enough exercise.  Have a family history of high cholesterol. What are the signs or symptoms? There are no symptoms of this condition. How is this diagnosed? This condition may be diagnosed from the results of a blood test.  If you are older than age 57, your health care provider may check your cholesterol every 4-6 years.  You may be checked more often if you already have high cholesterol or other risk factors for heart disease. The blood test for cholesterol measures:  "Bad" cholesterol (LDL cholesterol). This is the main type of cholesterol that causes heart disease. The desired level for LDL is less than 100.  "Good" cholesterol (HDL cholesterol). This type helps to protect against heart disease by cleaning the arteries and carrying the LDL away. The desired level for  HDL is 60 or higher.  Triglycerides. These are fats that the body can store or burn for energy. The desired number for triglycerides is lower than 150.  Total cholesterol. This is a measure of the total amount of cholesterol in your blood, including LDL cholesterol, HDL cholesterol, and triglycerides. A healthy number is less than 200. How is this treated? This condition is treated with diet changes, lifestyle changes, and medicines. Diet changes  This may include eating more whole grains, fruits, vegetables, nuts, and fish.  This may also include cutting back on red meat and foods that have a lot of added sugar. Lifestyle changes  Changes may include getting at least 40 minutes of aerobic exercise 3 times a week. Aerobic exercises include walking, biking, and swimming. Aerobic exercise along with a healthy diet can help you maintain a healthy weight.  Changes may also include quitting smoking. Medicines  Medicines are usually given if diet and lifestyle changes have failed to reduce your cholesterol to healthy levels.  Your health care provider may prescribe a statin medicine. Statin medicines have been shown to reduce cholesterol, which can reduce the risk of heart disease. Follow these instructions at home: Eating and drinking If told by your health care provider:  Eat chicken (without skin), fish, veal, shellfish, ground [place] breast, and round or loin cuts of red meat.  Do not eat fried foods or fatty meats, such as hot dogs and salami.  Eat plenty of fruits, such as apples.  Eat plenty of vegetables, such as broccoli, potatoes, and carrots.  Eat beans, peas, and lentils.  Eat grains such as barley, rice,  couscous, and bulgur wheat.  Eat pasta without cream sauces.  Use skim or nonfat milk, and eat low-fat or nonfat yogurt and cheeses.  Do not eat or drink whole milk, cream, ice cream, egg yolks, or hard cheeses.  Do not eat stick margarine or tub margarines that  contain trans fats (also called partially hydrogenated oils).  Do not eat saturated tropical oils, such as coconut oil and palm oil.  Do not eat cakes, cookies, crackers, or other baked goods that contain trans fats.  General instructions  Exercise as directed by your health care provider. Increase your activity level with activities such as gardening, walking, and taking the stairs.  Take over-the-counter and prescription medicines only as told by your health care provider.  Do not use any products that contain nicotine or tobacco, such as cigarettes and e-cigarettes. If you need help quitting, ask your health care provider.  Keep all follow-up visits as told by your health care provider. This is important. Contact a health care provider if:  You are struggling to maintain a healthy diet or weight.  You need help to start on an exercise program.  You need help to stop smoking. Get help right away if:  You have chest pain.  You have trouble breathing. This information is not intended to replace advice given to you by your health care provider. Make sure you discuss any questions you have with your health care provider. Document Revised: 10/05/2017 Document Reviewed: 04/01/2016 Elsevier Patient Education  Reader.

## 2019-10-30 NOTE — Progress Notes (Signed)
   Subjective:    Patient ID: [name], female    DOB: 10-Mar-1956, 64 y.o.   MRN: [id number]   CC: headache & stomach ache  HPI: Headache Patient presenting today for periodic headaches.  States that ever since she fell she has been having had a several small.  Had a CT scan which was negative.  Is being worked up for syncope at this time.  Denies any phono photophobia.  Denies any nausea, vomiting, diarrhea.  Does report that the headaches are frontal and she has to hold her head when she has them.  Has not been taking anything for it because was not sure if she is allowed to take Tylenol when she is having a Holter monitor test.  Did feel dizzy before she fell.  Holter monitor was just turned in today so she is not sure the results.  Is following with Dr. Gwenlyn Found, cardiology, for this.  Stomach ache Patient does report stomachaches.  Has been happening for 2 months.  Reports that it is right upper quadrant, epigastric, left upper quadrant.  Comes and goes.  Denies any nausea, vomiting, diarrhea.  Does state sporadic and no pattern.  Is not associated with foods that she is aware of, but does eat lots of fatty and spicy foods.  Denies any fever.  Mother has a history of pancreatitis and symptoms her brother.  Is having regular daily soft bowel movements.  Does drink a lot of water.  Objective:  BP (!) 164/90   Pulse (!) 51   Wt 216 lb 12.8 oz (98.3 kg)   SpO2 99%   BMI 39.65 kg/m  Vitals and nursing note reviewed  General: well nourished, in no acute distress HEENT: normocephalic,PERRL, no scleral icterus or conjunctival pallor, no nasal discharge, moist mucous membranes, good dentition without erythema or discharge noted in posterior oropharynx Neck: supple, non-tender, without lymphadenopathy Cardiac: RRR, clear S1 and S2, no murmurs, rubs, or gallops Respiratory: clear to auscultation bilaterally, no increased work of breathing Abdomen: soft, RUQ tenderness, +Murphy sign,  nondistended, no masses or organomegaly. Bowel sounds present Extremities: no edema or cyanosis. Warm, well perfused. 2+ radial and PT pulses bilaterally Skin: warm and dry, no rashes noted Neuro: alert and oriented, no focal deficits, CN2-12 intact   Assessment & Plan:    Headache Patient reports headaches which seem similar to tension headaches.  Advised to take Tylenol as needed.  Advised to continue staying well-hydrated.  Neuro exam within normal limits.  Recent CT imaging was negative.  Strict return precautions given.  Advised to send over records of Holter monitor so he may follow-up he did not have an appointment with Dr. Gwenlyn Found schedule.  Follow-up with either cardiology or associate.  RUQ pain Patient with positive Murphy sign as well as right upper quadrant pain on palpation.  Can consider gallbladder pathology especially given poor diet of fatty foods.  Will obtain right upper quadrant ultrasound.  We will also obtain CMP, CBC, lipase to rule out other GI pathology such as pancreatitis or liver pathology.  Strict return precautions given.  Advised to go to the emergency room if worsening abdominal pain before ultrasound.  Follow-up if no improvement.    Return if symptoms worsen or fail to improve.   Caroline More, DO, PGY-3

## 2019-10-30 NOTE — Assessment & Plan Note (Signed)
Patient reports headaches which seem similar to tension headaches.  Advised to take Tylenol as needed.  Advised to continue staying well-hydrated.  Neuro exam within normal limits.  Recent CT imaging was negative.  Strict return precautions given.  Advised to send over records of Holter monitor so he may follow-up he did not have an appointment with Dr. Gwenlyn Found schedule.  Follow-up with either cardiology or associate.

## 2019-10-30 NOTE — Assessment & Plan Note (Signed)
Patient with positive [name] sign as well as right upper quadrant pain on palpation.  Can consider gallbladder pathology especially given poor diet of fatty foods.  Will obtain right upper quadrant ultrasound.  We will also obtain CMP, CBC, lipase to rule out other GI pathology such as pancreatitis or liver pathology.  Strict return precautions given.  Advised to go to the emergency room if worsening abdominal pain before ultrasound.  Follow-up if no improvement.

## 2019-10-31 ENCOUNTER — Ambulatory Visit: Payer: Medicare Other | Admitting: Family Medicine

## 2019-10-31 ENCOUNTER — Encounter: Payer: Self-pay | Admitting: Gastroenterology

## 2019-10-31 ENCOUNTER — Encounter: Payer: Self-pay | Admitting: Pharmacist

## 2019-10-31 ENCOUNTER — Ambulatory Visit (AMBULATORY_SURGERY_CENTER): Payer: Medicare Other | Admitting: Gastroenterology

## 2019-10-31 VITALS — BP 121/85 | HR 72 | Temp 98.7°F | Resp 12 | Ht 62.0 in | Wt 218.0 lb

## 2019-10-31 DIAGNOSIS — D124 Benign neoplasm of descending colon: Secondary | ICD-10-CM

## 2019-10-31 DIAGNOSIS — Z86010 Personal history of colonic polyps: Secondary | ICD-10-CM

## 2019-10-31 DIAGNOSIS — D125 Benign neoplasm of sigmoid colon: Secondary | ICD-10-CM | POA: Diagnosis not present

## 2019-10-31 DIAGNOSIS — D120 Benign neoplasm of cecum: Secondary | ICD-10-CM

## 2019-10-31 LAB — COMPREHENSIVE METABOLIC PANEL
ALT: 13 IU/L (ref 0–32)
AST: 13 IU/L (ref 0–40)
Albumin/Globulin Ratio: 1.7 (ref 1.2–2.2)
Albumin: 4.5 g/dL (ref 3.8–4.8)
Alkaline Phosphatase: 147 IU/L — ABNORMAL HIGH (ref 39–117)
BUN/Creatinine Ratio: 11 — ABNORMAL LOW (ref 12–28)
BUN: 9 mg/dL (ref 8–27)
Bilirubin Total: 0.2 mg/dL (ref 0.0–1.2)
CO2: 23 mmol/L (ref 20–29)
Calcium: 11 mg/dL — ABNORMAL HIGH (ref 8.7–10.3)
Chloride: 107 mmol/L — ABNORMAL HIGH (ref 96–106)
Creatinine, Ser: 0.84 mg/dL (ref 0.57–1.00)
GFR calc Af Amer: 86 mL/min/{1.73_m2} (ref >59)
GFR calc non Af Amer: 75 mL/min/{1.73_m2} (ref >59)
Globulin, Total: 2.6 g/dL (ref 1.5–4.5)
Glucose: 79 mg/dL (ref 65–99)
Potassium: 4.2 mmol/L (ref 3.5–5.2)
Sodium: 143 mmol/L (ref 134–144)
Total Protein: 7.1 g/dL (ref 6.0–8.5)

## 2019-10-31 LAB — CBC
Hematocrit: 38.9 % (ref 34.0–46.6)
Hemoglobin: 12.5 g/dL (ref 11.1–15.9)
MCH: 27.2 pg (ref 26.6–33.0)
MCHC: 32.1 g/dL (ref 31.5–35.7)
MCV: 85 fL (ref 79–97)
Platelets: 304 10*3/uL (ref 150–450)
RBC: 4.59 x10E6/uL (ref 3.77–5.28)
RDW: 12.4 % (ref 11.7–15.4)
WBC: 8.2 10*3/uL (ref 3.4–10.8)

## 2019-10-31 LAB — LIPASE: Lipase: 15 U/L (ref 14–72)

## 2019-10-31 MED ORDER — SODIUM CHLORIDE 0.9 % IV SOLN: 500.0000 mL | Freq: Once | INTRAVENOUS | Status: DC

## 2019-10-31 NOTE — Progress Notes (Signed)
[facility]- [place]

## 2019-10-31 NOTE — Progress Notes (Signed)
Report given to PACU, vss 

## 2019-10-31 NOTE — Assessment & Plan Note (Signed)
LDL remains above goal for primary prevention. Patient was unable to tolerate simvastatin and pravastatin in the past d/t hallucinations.  Admit having a "terrible" diet and eating out most days.   Will start low dose rosuvastatin at 5mg  every Monday and Friday, work on positive lifestyle modifications, and repeat fasting blood work in 2 months. Patient was encouraged to call back is she developed problems with rosuvastatin.

## 2019-10-31 NOTE — Patient Instructions (Signed)
Please read handouts provided. Continue present medications. Await pathology results.        YOU HAD AN ENDOSCOPIC PROCEDURE TODAY AT THE [facility]:   Refer to the procedure report that was given to you for any specific questions about what was found during the examination.  If the procedure report does not answer your questions, please call your gastroenterologist to clarify.  If you requested that your care partner not be given the details of your procedure findings, then the procedure report has been included in a sealed envelope for you to review at your convenience later.  YOU SHOULD EXPECT: Some feelings of bloating in the abdomen. Passage of more gas than usual.  Walking can help get rid of the air that was put into your GI tract during the procedure and reduce the bloating. If you had a lower endoscopy (such as a colonoscopy or flexible sigmoidoscopy) you may notice spotting of blood in your stool or on the toilet paper. If you underwent a bowel prep for your procedure, you may not have a normal bowel movement for a few days.  Please Note:  You might notice some irritation and congestion in your nose or some drainage.  This is from the oxygen used during your procedure.  There is no need for concern and it should clear up in a day or so.  SYMPTOMS TO REPORT IMMEDIATELY:   Following lower endoscopy (colonoscopy or flexible sigmoidoscopy):  Excessive amounts of blood in the stool  Significant tenderness or worsening of abdominal pains  Swelling of the abdomen that is new, acute  Fever of 100F or higher    For urgent or emergent issues, a gastroenterologist can be reached at any hour by calling [phone].   DIET:  We do recommend a small meal at first, but then you may proceed to your regular diet.  Drink plenty of fluids but you should avoid alcoholic beverages for 24 hours.  ACTIVITY:  You should plan to take it easy for the rest of today and you should NOT  DRIVE or use heavy machinery until tomorrow (because of the sedation medicines used during the test).    FOLLOW UP: Our staff will call the number listed on your records 48-72 hours following your procedure to check on you and address any questions or concerns that you may have regarding the information given to you following your procedure. If we do not reach you, we will leave a message.  We will attempt to reach you two times.  During this call, we will ask if you have developed any symptoms of COVID 19. If you develop any symptoms (ie: fever, flu-like symptoms, shortness of breath, cough etc.) before then, please call [phone].  If you test positive for Covid 19 in the 2 weeks post procedure, please call and report this information to us.    If any biopsies were taken you will be contacted by phone or by letter within the next 1-3 weeks.  Please call us at [phone] if you have not heard about the biopsies in 3 weeks.    SIGNATURES/CONFIDENTIALITY: You and/or your care partner have signed paperwork which will be entered into your electronic medical record.  These signatures attest to the fact that that the information above on your After Visit Summary has been reviewed and is understood.  Full responsibility of the confidentiality of this discharge information lies with you and/or your care-partner. 

## 2019-10-31 NOTE — Progress Notes (Signed)
Called to room to assist during endoscopic procedure.  Patient ID and intended procedure confirmed with present staff. Received instructions for my participation in the procedure from the performing physician.  

## 2019-10-31 NOTE — Op Note (Signed)
[facility] Patient Name: [name] Procedure Date: 10/31/2019 10:27 AM MRN: [id number] Endoscopist: Ladene Artist , MD Age: 64 Referring MD:  Date of Birth: Apr 01, 1956 Gender: Female Account #: [id number] Procedure:                Colonoscopy Indications:              Surveillance: Personal history of adenomatous                            polyps on last colonoscopy 5 years ago Medicines:                Monitored Anesthesia Care Procedure:                Pre-Anesthesia Assessment:                           - Prior to the procedure, a History and Physical                            was performed, and patient medications and                            allergies were reviewed. The patient's tolerance of                            previous anesthesia was also reviewed. The risks                            and benefits of the procedure and the sedation                            options and risks were discussed with the patient.                            All questions were answered, and informed consent                            was obtained. Prior Anticoagulants: The patient has                            taken no previous anticoagulant or antiplatelet                            agents. ASA Grade Assessment: II - A patient with                            mild systemic disease. After reviewing the risks                            and benefits, the patient was deemed in                            satisfactory condition to undergo the procedure.  After obtaining informed consent, the colonoscope                            was passed under direct vision. Throughout the                            procedure, the patient's blood pressure, pulse, and                            oxygen saturations were monitored continuously. The                            Colonoscope was introduced through the anus and                            advanced to the the  cecum, identified by its                            appearance. The ileocecal valve, appendiceal                            orifice, and rectum were photographed. The quality                            of the bowel preparation was excellent. The                            colonoscopy was performed without difficulty. The                            patient tolerated the procedure well. Scope In: 10:46:23 AM Scope Out: 11:01:13 AM Scope Withdrawal Time: 0 hours 11 minutes 14 seconds  Total Procedure Duration: 0 hours 14 minutes 50 seconds  Findings:                 The perianal and digital rectal examinations were                            normal.                           Two sessile polyps were found in the cecum. The                            polyps were 3 to 4 mm in size. These polyps were                            removed with a cold biopsy forceps. Resection and                            retrieval were complete.                           Two sessile polyps were found in the sigmoid colon  and descending colon. The polyps were 6 to 7 mm in                            size. These polyps were removed with a cold snare.                            Resection and retrieval were complete.                           Internal hemorrhoids were found during                            retroflexion. The hemorrhoids were small and Grade                            I (internal hemorrhoids that do not prolapse).                           The exam was otherwise without abnormality on                            direct and retroflexion views. Complications:            No immediate complications. Estimated blood loss:                            None. Estimated Blood Loss:     Estimated blood loss: none. Impression:               - Two 3 to 4 mm polyps in the cecum, removed with a                            cold biopsy forceps. Resected and retrieved.                            - Two 6 to 7 mm polyps in the sigmoid colon and in                            the descending colon, removed with a cold snare.                            Resected and retrieved.                           - Internal hemorrhoids.                           - The examination was otherwise normal on direct                            and retroflexion views. Recommendation:           - Repeat colonoscopy after studies are complete for  surveillance based on pathology results.                           - Patient has a contact number available for                            emergencies. The signs and symptoms of potential                            delayed complications were discussed with the                            patient. Return to normal activities tomorrow.                            Written discharge instructions were provided to the                            patient.                           - Resume previous diet.                           - Continue present medications.                           - Await pathology results. Ladene Artist, MD 10/31/2019 11:07:50 AM This report has been signed electronically.

## 2019-11-04 ENCOUNTER — Ambulatory Visit (HOSPITAL_COMMUNITY): Payer: Medicare Other

## 2019-11-04 ENCOUNTER — Telehealth: Payer: Self-pay | Admitting: *Deleted

## 2019-11-04 NOTE — Telephone Encounter (Signed)
  Follow up Call-  Call back number 10/31/2019  Post procedure Call Back phone  # [phone]  Permission to leave phone message Yes  Some recent data might be hidden     Patient questions:  Do you have a fever, pain , or abdominal swelling? No. Pain Score  0 *  Have you tolerated food without any problems? Yes.    Have you been able to return to your normal activities? Yes.    Do you have any questions about your discharge instructions: Diet   No. Medications  No. Follow up visit  No.  Do you have questions or concerns about your Care? No.  Actions: * If pain score is 4 or above: No action needed, pain <4.  1. Have you developed a fever since your procedure? no  2.   Have you had an respiratory symptoms (SOB or cough) since your procedure? no  3.   Have you tested positive for COVID 19 since your procedure no  4.   Have you had any family members/close contacts diagnosed with the COVID 19 since your procedure?  no   If yes to any of these questions please route to Joylene John, RN and Alphonsa Gin, Therapist, sports.

## 2019-11-06 ENCOUNTER — Encounter: Payer: Self-pay | Admitting: Gastroenterology

## 2019-11-12 ENCOUNTER — Telehealth: Payer: Self-pay

## 2019-11-12 ENCOUNTER — Ambulatory Visit: Payer: Medicare Other | Attending: Internal Medicine

## 2019-11-12 DIAGNOSIS — Z20822 Contact with and (suspected) exposure to covid-19: Secondary | ICD-10-CM

## 2019-11-12 NOTE — Telephone Encounter (Signed)
Pt calls nurse line with concerns for potential COVID exposure. Pt reports headache, cough, fever, and chills. Pt denies SHOB or chest pain. Patient asking to come into office to be tested. Advised patient to schedule COVID testing at off-site facility. Patient given resources to obtain appointment.   To PCP  Talbot Grumbling, RN

## 2019-11-13 ENCOUNTER — Other Ambulatory Visit: Payer: Self-pay

## 2019-11-13 ENCOUNTER — Telehealth: Payer: Self-pay

## 2019-11-13 MED ORDER — ROSUVASTATIN CALCIUM 5 MG PO TABS: ORAL_TABLET | ORAL | 0 refills | Status: DC

## 2019-11-13 NOTE — Telephone Encounter (Signed)
Received a message from Barb Merino, RN stating patient had called in stating she believed she had symptoms of covid. Patient had Endoscopy procedure 10/31/19 and developed symptoms 11/09/19 of headache, body aches, a smell of ammonia in her nose, cough and fever of 102F.  She has had a covid test on 11/12/19 with pending results. Patient states she 'never goes out' and her children bring her groceries to her.   Informed patient we take all precautions recommended for health care professionals in order to protect patients and families and we are not aware of anyone providing care for her on day of procedure as having experienced any sickness.  Many of our staff are vaccinated for covid and all wear protective facial coverings and perform additional cleaning of the facility.   Acknowledged sympathies for the patient not feeling well.  She has not sought medical care and advised patient due to fever of 102F and concerning symptoms, she should contact her PCP for further advice and seek immediate attention at the hospital emergency room if symptoms continue to worsen. Patient lives alone and instructed not to wait to contact her PCP who may be able to offer a virtual visit or call in medications for her symptoms.   Patient acknowledged understanding.  Nothing further needed.  Routed to Dr. Fuller Plan as Juluis Rainier.

## 2019-11-14 ENCOUNTER — Other Ambulatory Visit: Payer: Self-pay

## 2019-11-14 ENCOUNTER — Telehealth (INDEPENDENT_AMBULATORY_CARE_PROVIDER_SITE_OTHER): Payer: Medicare Other | Admitting: Family Medicine

## 2019-11-14 ENCOUNTER — Telehealth: Payer: Medicare Other

## 2019-11-14 DIAGNOSIS — R6889 Other general symptoms and signs: Secondary | ICD-10-CM | POA: Diagnosis not present

## 2019-11-14 LAB — NOVEL CORONAVIRUS, NAA: SARS-CoV-2, NAA: NOT DETECTED

## 2019-11-14 NOTE — Telephone Encounter (Signed)
Patient calls nurse line with questions about test results. Informed patient that COVID results were negative. Pt still reports cough and fever (tmax: 102), pt also reports loss of smell. Pt denies SHOB, difficulty breathing or chest pains.  Scheduled virtual visit this afternoon with Dr. Criss Rosales.   ED precautions given.

## 2019-11-14 NOTE — Progress Notes (Signed)
[facility] Telemedicine Visit  Patient consented to have virtual visit. Method of visit: Telephone  Encounter participants: Patient: [name] - located at home Provider: Sherene Sires - located at home telemedicine Others (if applicable):   Chief Complaint: Flulike symptoms  HPI: Patient called frustrated because she is having flulike symptoms.  Did have negative Covid test on the 27th.  Is frustrated because she said that she has a decrease in her taste and smell.  She is still having cough and subjective fevers with muscle aches.  She says she is safe and does not need to go to the emergency department.  She wants to know if there is any medicine that she should take to make her taste and smell come back.  She is able to keep food down but has lost her appetite.  ROS: per HPI  Pertinent PMHx: Covid -1/27, hypertension  Exam:  Respiratory: Did have cough on exam but was speaking in full sentences, speaking calmly did not sound like she was in respiratory distress.  Was appropriately processing information and asking questions.  Assessment/Plan:  Flu-like symptoms Discussed emergency precautions as well as over-the-counter cold and flu remedies for symptoms.  Discussed that as long as she stays safe at home and is isolating the etiology of cold versus flu versus potentially incorrect Covid negative test did not really change her treatment plan.  Patient was advised to isolate as though she has Covid which she thought was a reasonable decision.    Time spent during visit with patient: 9 minutes

## 2019-11-16 DIAGNOSIS — Z20822 Contact with and (suspected) exposure to covid-19: Secondary | ICD-10-CM | POA: Insufficient documentation

## 2019-11-16 NOTE — Assessment & Plan Note (Signed)
Discussed emergency precautions as well as over-the-counter cold and flu remedies for symptoms.  Discussed that as long as she stays safe at home and is isolating the etiology of cold versus flu versus potentially incorrect Covid negative test did not really change her treatment plan.  Patient was advised to isolate as though she has Covid which she thought was a reasonable decision.

## 2019-11-17 ENCOUNTER — Ambulatory Visit: Payer: Medicare Other

## 2019-11-18 ENCOUNTER — Telehealth: Payer: Self-pay

## 2019-11-18 NOTE — Telephone Encounter (Signed)
Patient calls nurse line unable to speak in full sentences due to coughing so bad. Patient is frustrated as she has had a negative Covid test, however has lost her taste and smell, and has a horrible cough. Per telemed visit yesterday, [name] expressed she could have a false negative, but treatment would remain the same. Patient has no interest in retesting, she would just like something prescription strength for cough, as she has tried most OTC medications. ED precautions given to patient and advised to self quarantine. Please send in cough medication to [facility].

## 2019-11-18 NOTE — Telephone Encounter (Signed)
Please schedule patient for telemed visit. If coughing is this bad we will need to investigate history further. I agree that if her coughing worsens or shortness of breath she should go to ED  Caroline More, DO, PGY-3 [facility] 11/18/2019 3:31 PM

## 2019-11-19 ENCOUNTER — Other Ambulatory Visit: Payer: Self-pay

## 2019-11-19 ENCOUNTER — Telehealth (INDEPENDENT_AMBULATORY_CARE_PROVIDER_SITE_OTHER): Payer: Medicare Other | Admitting: Family Medicine

## 2019-11-19 DIAGNOSIS — Z20822 Contact with and (suspected) exposure to covid-19: Secondary | ICD-10-CM

## 2019-11-19 MED ORDER — BENZONATATE 200 MG PO CAPS: 200.0000 mg | ORAL_CAPSULE | Freq: Three times a day (TID) | ORAL | 0 refills | Status: DC | PRN

## 2019-11-19 NOTE — Progress Notes (Signed)
[facility] Telemedicine Visit  Patient consented to have virtual visit. Method of visit: Telephone  Encounter participants: Patient: [name] - located at home Provider: [place] Kleber Crean - located at [facility]  Others (if applicable): n/a  Chief Complaint: Covid 19  HPI: Symptoms consistent with COVID-19 Patient with hx of cough and fever since 1/24. She had a negative test on that day. She had a virtual visit on 1/29 with continued symptoms.   Patient reports she is still having fever, chills, HA, can't smell or taste anything, and is fatigued. No SOB, but is having to sleep with mouth open. Coughing up phlegm. She has been taking mucinex, she has not been able to move around the house much. She has been putting honey in tea for her cough. Past 24 hours her temperature has been 100.4. Patient thinks that since her sx started on 1/24, she has been feeling a little better overall.   ROS: per HPI  Pertinent PMHx: HTN, hypothyroidism,  Exam:  Respiratory: able to speak in complete sentences without issue  Assessment/Plan:  Suspected COVID-19 virus infection Patient with negative test on 1/24 but symptoms consistent with Covid including fever, chills, headache and loss of smell and taste.  Patient reports that she is continuing to not have any shortness of breath.  She also denies any chest pain.  Likely that patient has COVID-19 and negative test was inaccurate.   -Counseled on wearing a mask, washing hands and avoiding social gatherings  -ED precautions discussed and patient expressed good understanding -Patient instructed to avoid others until they meet criteria for ending isolation after any suspected COVID, which are:  -24 hours with no fever (without use of medicaitons) and -respiratory symptoms have improved (e.g. cough, shortness of breath) or  -10 days since symptoms first appeared    Time spent during visit with patient: 16 minutes  [place]  Adileny Delon, DO PGY-3, [place]

## 2019-11-19 NOTE — Telephone Encounter (Signed)
Scheduled

## 2019-11-20 ENCOUNTER — Encounter: Payer: Self-pay | Admitting: Family Medicine

## 2019-11-20 NOTE — Assessment & Plan Note (Signed)
Patient with negative test on 1/24 but symptoms consistent with Covid including fever, chills, headache and loss of smell and taste.  Patient reports that she is continuing to not have any shortness of breath.  She also denies any chest pain.  Likely that patient has COVID-19 and negative test was inaccurate.   -Counseled on wearing a mask, washing hands and avoiding social gatherings  -ED precautions discussed and patient expressed good understanding -Patient instructed to avoid others until they meet criteria for ending isolation after any suspected COVID, which are:  -24 hours with no fever (without use of medicaitons) and -respiratory symptoms have improved (e.g. cough, shortness of breath) or  -10 days since symptoms first appeared

## 2019-11-26 ENCOUNTER — Ambulatory Visit (HOSPITAL_COMMUNITY)
Admission: EM | Admit: 2019-11-26 | Discharge: 2019-11-26 | Disposition: A | Discharge status: Home / Self Care | Payer: Medicare Other | Attending: Family Medicine | Admitting: Family Medicine

## 2019-11-26 ENCOUNTER — Ambulatory Visit (INDEPENDENT_AMBULATORY_CARE_PROVIDER_SITE_OTHER): Payer: Medicare Other

## 2019-11-26 ENCOUNTER — Other Ambulatory Visit: Payer: Self-pay

## 2019-11-26 ENCOUNTER — Telehealth (INDEPENDENT_AMBULATORY_CARE_PROVIDER_SITE_OTHER): Payer: Medicare Other | Admitting: Family Medicine

## 2019-11-26 ENCOUNTER — Encounter (HOSPITAL_COMMUNITY): Payer: Self-pay | Admitting: Emergency Medicine

## 2019-11-26 DIAGNOSIS — R0602 Shortness of breath: Secondary | ICD-10-CM

## 2019-11-26 DIAGNOSIS — Z20822 Contact with and (suspected) exposure to covid-19: Secondary | ICD-10-CM | POA: Insufficient documentation

## 2019-11-26 DIAGNOSIS — R05 Cough: Secondary | ICD-10-CM | POA: Insufficient documentation

## 2019-11-26 DIAGNOSIS — R059 Cough, unspecified: Secondary | ICD-10-CM

## 2019-11-26 MED ORDER — PREDNISONE 10 MG (21) PO TBPK: ORAL_TABLET | Freq: Every day | ORAL | 0 refills | Status: DC

## 2019-11-26 NOTE — ED Triage Notes (Signed)
COVID negative prior to colonoscopy.

## 2019-11-26 NOTE — ED Provider Notes (Signed)
[facility]   AC:[id number] 11/26/19 Arrival Time: 1016  ASSESSMENT & PLAN:  1. SOB (shortness of breath)   2. Cough   3. Suspected COVID-19 virus infection     COVID testing sent. Overall reports she is feeling better despite history initially given. VSS here. No respiratory distress.  Begin trial of: Meds ordered this encounter  Medications  . predniSONE (STERAPRED UNI-PAK 21 TAB) 10 MG (21) TBPK tablet    Sig: Take by mouth daily. Take as directed.    Dispense:  21 tablet    Refill:  0   I have personally viewed the imaging studies ordered this visit. Multifocal PNA; consistent with viral.  Recommend: Follow-up Information    Caroline More, DO.   Specialty: Family Medicine Why: As needed. Contact information: [address] [place] [address] [place].   Specialty: Urgent Care Why: If worsening or failing to improve as anticipated. Contact information: [place] [phone]          Reviewed expectations re: course of current medical issues. Questions answered. Outlined signs and symptoms indicating need for more acute intervention. Patient verbalized understanding. After Visit Summary given.   SUBJECTIVE: Progress note from PCP dated 11/26/2019 reviewed. History from: patient. [name] is a 64 y.o. female who reports having colonoscopy on 1/24. Began to feel unwell after this. Reports continuing fatigue, occasional headaches, decreased taste, and a productive cough. Mild SOB at times. No chest pain reported. Tessalon Perles and Robatussin with little help concerning her cough. Unsure if wheezing. Cough is worse at night and is affecting sleep. Overall decreased appetite and PO intake without n/v/d. Ambulatory without difficulty. Reports temp 100.4 F two days ago; no suspected temp since. Televisit with PCP this morning. Sent here  for evaluation. Chart review shows negative COVID test on 11/12/2019. No known COVID exposures. No urinary symptoms or abdominal pain reported. Illicit drug use: none.  Social History   Tobacco Use  Smoking Status Never Smoker  Smokeless Tobacco Never Used   Social History   Substance and Sexual Activity  Alcohol Use No  . Alcohol/week: 0.0 standard drinks     OBJECTIVE:  Vitals:   11/26/19 1037  BP: 102/77  Pulse: 98  Resp: 18  Temp: 98.2 F (36.8 C)  TempSrc: Oral  SpO2: 97%    General appearance: alert, oriented, no acute distress; appears fatigued Eyes: PERRLA; EOMI; conjunctivae normal HENT: normocephalic; atraumatic Neck: supple with FROM Lungs: without labored respirations; speaks full sentences without difficulty; mild bilateral exp wheezes, otherwise clear Heart: regular rate and rhythm Chest Wall: without tenderness to palpation Abdomen: soft, non-tender; no guarding or rebound tenderness Extremities: without edema; without calf swelling or tenderness; symmetrical without gross deformities Skin: warm and dry; without rash or lesions Neuro: normal gait Psychological: alert and cooperative; normal mood and affect  Labs: Labs Reviewed  NOVEL CORONAVIRUS, NAA (HOSP ORDER, SEND-OUT TO REF LAB; TAT 18-24 HRS)    Imaging: DG Chest 2 View  Result Date: 11/26/2019 CLINICAL DATA:  Cough for 3 weeks. EXAM: CHEST - 2 VIEW COMPARISON:  09/30/2013 FINDINGS: Cardiac silhouette is normal in size. No mediastinal or hilar masses. No evidence of adenopathy. There is opacity at both lung bases, left greater than right, with additional linear and subtle hazy airspace opacities in the mid to lower lungs. Suspect a combination multifocal pneumonia with basilar atelectasis. Remainder  of the lungs is clear. No evidence of pulmonary edema. No pleural effusion and no pneumothorax. Skeletal structures intact. IMPRESSION: 1. Lung opacities as detailed above. Suspect subtle multifocal  pneumonia with basilar atelectasis. Electronically Signed   By: Lajean Manes M.D.   On: 11/26/2019 10:59     Allergies  Allergen Reactions  . Hydrocodone     Mood disturbance  . Simvastatin     REACTION: muscle aches, GI complaints    Past Medical History:  Diagnosis Date  . Allergy   . Anxiety   . 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 Carpal tunnel syndrome, bilateral   . Chest pain   . Heart murmur    age 57 said had heart murmur.  . Hyperlipidemia   . Hypertension   . Normal cardiac stress test    Myoview stress test  . OAB (overactive bladder) 09/01/2015  . Osteopenia 05/2018   T score -1.1 FRAX 2.5%/ 0.1%   Social History   Socioeconomic History  . Marital status: Divorced    Spouse name: Not on file  . Number of children: Not on file  . Years of education: Not on file  . Highest education level: Not on file  Occupational History  . 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 Smoking status: Never Smoker  . Smokeless tobacco: Never Used  Substance and Sexual Activity  . Alcohol use: No    Alcohol/week: 0.0 standard drinks  . Drug use: No  . Sexual activity: Yes    Partners: Male    Comment: 1ST INTERCOURSE- 18, PARTNERS - 75,  MARRIED- 38 YRS   Other Topics Concern  . Not on file  Social History Narrative  . Not on file   Social Determinants of Health   Financial Resource Strain:   . Difficulty of Paying Living Expenses: Not on file  Food Insecurity:   . Worried About Charity fundraiser in the Last Year: Not on file  . Ran Out of Food in the Last Year: Not on file  Transportation Needs:   . Lack of Transportation (Medical): Not on file  . Lack of Transportation (Non-Medical): Not on file  Physical Activity:   . Days of Exercise per Week: Not on file  . Minutes of Exercise per Session: Not on file  Stress:   . Feeling of  Stress : Not on file  Social Connections:   . Frequency of Communication with Friends and Family: Not on file  . Frequency of Social Gatherings with Friends and Family: Not on file  . Attends Religious Services: Not on file  . Active Member of Clubs or Organizations: Not on file  . Attends Archivist Meetings: Not on file  . Marital Status: Not on file  Intimate Partner Violence:   . Fear of Current or Ex-Partner: Not on file  . Emotionally Abused: Not on file  . Physically Abused: Not on file  . Sexually Abused: Not on file   Family History  Problem Relation Age of Onset  . Hypertension Mother   . Diabetes Father   . Hyperlipidemia Brother   . Breast cancer Paternal Grandmother   . Heart attack Neg Hx   . Sudden death Neg Hx   . Colon cancer Neg Hx    Past Surgical History:  Procedure Laterality Date  .  COLONOSCOPY    . POLYPECTOMY    . 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 TOTAL HIP ARTHROPLASTY Left 10/01/2013   Procedure: TOTAL HIP ARTHROPLASTY;  Surgeon: Kerin Salen, MD;  Location: [facility];  Service: Orthopedics;  Laterality: Left;     Vanessa Kick, MD 11/26/19 1147

## 2019-11-26 NOTE — Discharge Instructions (Addendum)
You have been tested for COVID-19 today. °If your test returns positive, you will receive a phone call from [facility] regarding your results. °Negative test results are not called. °Both positive and negative results area always visible on MyChart. °If you do not have a MyChart account, sign up instructions are provided in your discharge papers. °Please do not hesitate to contact us should you have questions or concerns. ° °

## 2019-11-26 NOTE — Assessment & Plan Note (Signed)
Patient with continued cough and worsening sputum production.  Also now with shortness of breath.  No history of COPD.  I am concerned that she may have a pneumonia as she did report some fevers as well as cough and sputum production.  Was Covid tested last month which was negative.  May benefit from a repeat Covid test as well.  Given concerns of shortness of breath I advised that she go to an urgent care today to be evaluated.  She states she will go to the [facility] urgent care.  Was appreciative of call and advice.  Strict return precautions given.  I advised that if she has worsening shortness of breath or chest pain she go to the emergency department rather than urgent care.  Patient is speaking full sentences in a telephone call which is reassuring.  Follow-up if no improvement.

## 2019-11-26 NOTE — Progress Notes (Signed)
[facility] Telemedicine Visit  Patient consented to have virtual visit. Method of visit: Video was attempted, but technology challenges prevented patient from using video, so visit was conducted via telephone.  Encounter participants: Patient: [name] - located at home Provider: Caroline More - located at [facility] Others (if applicable): none  Chief Complaint: cough  HPI: Cough Patient reports she went in for a colonoscopy and on 1/24 she started to feel "sick". Reports fever, headache, loss of taste, no desire for food, "lethargic". Has been taking tessalon perles and robatussin. Does report symptoms have improved somewhat. Today has improved symptoms. Sometimes gets congestion. After coughing a lot she notices blood in sputum. Today notes her chest is tight and increased cough. Notes sputum production that has some blood in it. Does note some SOB, but not a lot. Worse cough in the night. States when she coughs she feels like she has to keep coughing "to get something up".   ROS: per HPI  Pertinent PMHx: HTN, obesity   Exam:  Respiratory: speaking full sentences, no increased WOB  Assessment/Plan:  Cough Patient with continued cough and worsening sputum production.  Also now with shortness of breath.  No history of COPD.  I am concerned that she may have a pneumonia as she did report some fevers as well as cough and sputum production.  Was Covid tested last month which was negative.  May benefit from a repeat Covid test as well.  Given concerns of shortness of breath I advised that she go to an urgent care today to be evaluated.  She states she will go to the [facility] urgent care.  Was appreciative of call and advice.  Strict return precautions given.  I advised that if she has worsening shortness of breath or chest pain she go to the emergency department rather than urgent care.  Patient is speaking full sentences in a telephone call which is reassuring.   Follow-up if no improvement.    Time spent during visit with patient: 15 minutes

## 2019-11-26 NOTE — ED Triage Notes (Signed)
Had a colonoscopy 1/14. Has been sick since 1/21. Cough, bodyaches, chills, fever, lethargy. PCP asked her to come here to rule out pneumonia.

## 2019-11-26 NOTE — ED Triage Notes (Signed)
Reports confusion lately. (Over past week).

## 2019-11-28 LAB — NOVEL CORONAVIRUS, NAA (HOSP ORDER, SEND-OUT TO REF LAB; TAT 18-24 HRS): SARS-CoV-2, NAA: NOT DETECTED

## 2019-12-05 ENCOUNTER — Other Ambulatory Visit: Payer: Self-pay

## 2019-12-05 MED ORDER — ROSUVASTATIN CALCIUM 5 MG PO TABS: ORAL_TABLET | ORAL | 0 refills | Status: DC

## 2019-12-08 ENCOUNTER — Other Ambulatory Visit: Payer: Self-pay

## 2019-12-08 MED ORDER — ROSUVASTATIN CALCIUM 5 MG PO TABS: ORAL_TABLET | ORAL | 0 refills | Status: DC

## 2019-12-16 ENCOUNTER — Telehealth: Payer: Self-pay | Admitting: Cardiovascular Disease

## 2019-12-16 ENCOUNTER — Other Ambulatory Visit: Payer: Self-pay

## 2019-12-16 MED ORDER — ALBUTEROL SULFATE HFA 108 (90 BASE) MCG/ACT IN AERS: INHALATION_SPRAY | RESPIRATORY_TRACT | 2 refills | Status: DC

## 2019-12-16 NOTE — Telephone Encounter (Signed)
New message  Patient states that she was told not to drive for 6 months, patient wants to know when her 6 months is up.  She would also like to discuss getting an inhaler.Please call.

## 2019-12-16 NOTE — Telephone Encounter (Signed)
Patient calls nurse line requesting a refill on her albuterol. Patient stated I stopped using "a while back," however Covid has made me have trouble breathing. Patient stated I am not in distress, however will help to have on hand. Please advise.

## 2019-12-16 NOTE — Telephone Encounter (Signed)
Spoke to patient . Informed her her 6 month would be Iup 6 month from the day she had her syncopal episode.  she is do to see APP. in May 2021.  Appointment made for 02/19/20 at 10:15 am  At that time she can informed provider of her progress  Rn also informed patient she will need to contact her primary in regards to an inhaler  patient voiced understanding for both information.

## 2019-12-26 ENCOUNTER — Telehealth: Payer: Self-pay

## 2019-12-26 NOTE — Telephone Encounter (Signed)
Patient calls nurse line regarding questions concerning COVID vaccine. Patient reports that she was having COVID-like symptoms on 2/10 and was tested, however, COVID test was negative.   Patient calling to see at what time frame will it be safe for her to receive vaccine.   To PCP and preceptor.   Talbot Grumbling, RN

## 2019-12-26 NOTE — Telephone Encounter (Signed)
Bottom line: Patient should feel free to get the vaccination as soon as she can since she did not have documented COVID.  Copied from [facility] website - highlighted in red the important texts:  Data from clinical trials indicate that the currently authorized COVID-19 vaccines can be given safely to people with evidence of a prior SARS-CoV-2 infection. People should be offered vaccination regardless of history of prior symptomatic or asymptomatic SARS-CoV-2 infection. Viral testing to assess for acute SARS-CoV-2 infection or serologic testing to assess for prior infection is not recommended for the purposes of vaccine decision-making.  Vaccination of people with known current SARS-CoV-2 infection should be deferred until the person has recovered from the acute illness (if the person had symptoms) and they have met criteria to discontinue isolation. This recommendation applies to people who experience SARS-CoV-2 infection before receiving any vaccine dose and those who experience SARS-CoV-2 infection after the first dose of an mRNA vaccine but before receipt of the second dose.  While there is no recommended minimum interval between infection and vaccination, current evidence suggests that the risk of SARS-CoV-2 reinfection is low in the months after initial infection but may increase with time due to waning immunity. Thus, while vaccine supply remains limited, people with recent documented acute SARS-CoV-2 infection may choose to temporarily delay vaccination, if desired, recognizing that the risk of reinfection and, therefore, the need for vaccination, might increase with time following initial infection.

## 2019-12-26 NOTE — Telephone Encounter (Signed)
Attempted to inform patient of below. No answer, mailbox is full.   Will attempt to contact again later.   Talbot Grumbling, RN

## 2020-01-01 ENCOUNTER — Other Ambulatory Visit: Payer: Self-pay

## 2020-01-01 ENCOUNTER — Ambulatory Visit
Admission: RE | Admit: 2020-01-01 | Discharge: 2020-01-01 | Disposition: A | Discharge status: Home / Self Care | Payer: Medicare Other | Source: Ambulatory Visit | Attending: *Deleted | Admitting: *Deleted

## 2020-01-01 DIAGNOSIS — Z1231 Encounter for screening mammogram for malignant neoplasm of breast: Secondary | ICD-10-CM

## 2020-02-19 ENCOUNTER — Ambulatory Visit: Payer: Medicare Other | Admitting: Cardiology

## 2020-02-19 ENCOUNTER — Other Ambulatory Visit: Payer: Self-pay

## 2020-02-19 ENCOUNTER — Encounter: Payer: Self-pay | Admitting: Cardiology

## 2020-02-19 DIAGNOSIS — E785 Hyperlipidemia, unspecified: Secondary | ICD-10-CM | POA: Diagnosis not present

## 2020-02-19 DIAGNOSIS — R0789 Other chest pain: Secondary | ICD-10-CM | POA: Diagnosis not present

## 2020-02-19 DIAGNOSIS — I10 Essential (primary) hypertension: Secondary | ICD-10-CM | POA: Diagnosis not present

## 2020-02-19 DIAGNOSIS — R55 Syncope and collapse: Secondary | ICD-10-CM

## 2020-02-19 NOTE — Assessment & Plan Note (Signed)
On low dose crestor

## 2020-02-19 NOTE — Assessment & Plan Note (Signed)
Low risk Myoview 2016, normal LVF by echo Dec 2020

## 2020-02-19 NOTE — Patient Instructions (Addendum)
Medication Instructions:  STOP the Aspirin  *If you need a refill on your cardiac medications before your next appointment, please call your pharmacy*   Lab Work: None ordered If you have labs (blood work) drawn today and your tests are completely normal, you will receive your results only by: Marland Kitchen MyChart Message (if you have MyChart) OR . A paper copy in the mail If you have any lab test that is abnormal or we need to change your treatment, we will call you to review the results.   Testing/Procedures: None ordered   Follow-Up: At [facility], you and your health needs are our priority.  As part of our continuing mission to provide you with exceptional heart care, we have created designated Provider Care Teams.  These Care Teams include your primary Cardiologist (physician) and Advanced Practice Providers (APPs -  Physician Assistants and Nurse Practitioners) who all work together to provide you with the care you need, when you need it.  We recommend signing up for the patient portal called "MyChart".  Sign up information is provided on this After Visit Summary.  MyChart is used to connect with patients for Virtual Visits (Telemedicine).  Patients are able to view lab/test results, encounter notes, upcoming appointments, etc.  Non-urgent messages can be sent to your provider as well.   To learn more about what you can do with MyChart, go to [email or web address].    Your next appointment:   As needed   Other Instructions A referral has been placed to [facility]. They will call you to make the appointment.

## 2020-02-19 NOTE — Assessment & Plan Note (Signed)
Controlled.  

## 2020-02-19 NOTE — Progress Notes (Addendum)
Cardiology Office Note:    Date:  02/19/2020   ID:  [name], DOB 1956-07-09, MRN [id number]  PCP:  Caroline More, DO  Cardiologist:  Dr Gwenlyn Found Electrophysiologist:  None   Referring MD: Caroline More, DO   No chief complaint on file.   History of Present Illness:    [name] is a 64 y.o. female, former chaplain at [employer],  with a syncope and collapse 07/23/2020.  The patient says she was walking when she suddenly collapsed and suffered a minor head injury (small bump on her head). She had no lightheadedness, dizziness, palpitations, excessive sweating, or any other symptoms prior to her syncopal episode. She woke up and finished her sentence that she was saying before she had her syncopal episode.  Head CT was negative. She was referred to Dr Gwenlyn Found for further evaluation. Echo was WNL.  30 day monitor showed no arrhythmia.  On 09/28/2019 while wearing the monitor the patient called in after she had another syncopal event.  The MD on call was unable to get in touch with her and her monitor showed no arrhythmia.  When asked about this event today the patient can't remember any details but does not think she had any injury.   The patient is in the office today asking to get her driving privileges back since its been 6 months since her initial episode. She says she has not had any further syncopal spells since the Dec episode- which she had to be reminded about.  She did tell me at times she thinks she is a little "off".  She told me sometimes she is not sure where she is going. Today she ended up on [employer] "even though I know where the office is".  (I assume she is driving against our recommendation).  She says at times she feels like she is disconnected.  No history of seizures or focal neurologic deficits.   Past Medical History:  Diagnosis Date  . Allergy   . Anxiety   . 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 Carpal tunnel syndrome, bilateral   . Chest pain    . Heart murmur    age 81 said had heart murmur.  . Hyperlipidemia   . Hypertension   . Normal cardiac stress test    Myoview stress test  . OAB (overactive bladder) 09/01/2015  . Osteopenia 05/2018   T score -1.1 FRAX 2.5%/ 0.1%    Past Surgical History:  Procedure Laterality Date  . COLONOSCOPY    . POLYPECTOMY    . 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 TOTAL HIP ARTHROPLASTY Left 10/01/2013   Procedure: TOTAL HIP ARTHROPLASTY;  Surgeon: Kerin Salen, MD;  Location: [facility];  Service: Orthopedics;  Laterality: Left;    Current Medications: Current Meds  Medication Sig  . albuterol (PROVENTIL HFA) 108 (90 Base) MCG/ACT inhaler INHALE TWO PUFFS BY MOUTH INTO THE LUNGS EVERY 6 HOURS AS NEEDED FOR WHEEZING OR SHORTNESS OF BREATH  . benzonatate (TESSALON) 200 MG capsule Take 1 capsule (200 mg total) by mouth 3 (three) times daily as needed for cough.  . capsaicin (ZOSTRIX) 0.025 % cream Apply topically 2 (two) times daily.  . cholecalciferol (VITAMIN D3) 25 MCG (1000 UNIT) tablet Take 1,000 Units by mouth daily.  . fluticasone (FLONASE) 50 MCG/ACT nasal spray USE ONE SPRAY(S) IN EACH NOSTRIL TWICE DAILY  . gabapentin (NEURONTIN) 100 MG capsule TAKE 1 CAPSULE BY MOUTH  DAILY  . loratadine (EQ ALLERGY RELIEF) 10 MG tablet Take 1 tablet (10 mg total) by mouth daily.  . metoprolol tartrate (LOPRESSOR) 50 MG tablet Take 1 tablet (50 mg total) by mouth 2 (two) times daily.  . predniSONE (STERAPRED UNI-PAK 21 TAB) 10 MG (21) TBPK tablet Take by mouth daily. Take as directed.  . rosuvastatin (CRESTOR) 5 MG tablet Take 5mg  every Monday and Friday.  . [DISCONTINUED] aspirin 325 MG EC tablet Take 325 mg by mouth daily.     Allergies:   Hydrocodone and Simvastatin   Social History   Socioeconomic History  . Marital status: Divorced    Spouse name: Not on file  . Number of children: Not on file  . Years of education: Not on file  . Highest education level: Not on file   Occupational History  . 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 Smoking status: Never Smoker  . Smokeless tobacco: Never Used  Substance and Sexual Activity  . Alcohol use: No    Alcohol/week: 0.0 standard drinks  . Drug use: No  . Sexual activity: Yes    Partners: Male    Comment: 1ST INTERCOURSE- 27, PARTNERS - 40,  MARRIED- 60 YRS   Other Topics Concern  . Not on file  Social History Narrative  . Not on file   Social Determinants of Health   Financial Resource Strain:   . Difficulty of Paying Living Expenses:   Food Insecurity:   . Worried About Charity fundraiser in the Last Year:   . Arboriculturist in the Last Year:   Transportation Needs:   . Film/video editor (Medical):   Marland Kitchen Lack of Transportation (Non-Medical):   Physical Activity:   . Days of Exercise per Week:   . Minutes of Exercise per Session:   Stress:   . Feeling of Stress :   Social Connections:   . Frequency of Communication with Friends and Family:   . Frequency of Social Gatherings with Friends and Family:   . Attends Religious Services:   . Active Member of Clubs or Organizations:   . Attends Archivist Meetings:   Marland Kitchen Marital Status:      Family History: The patient's family history includes Breast cancer in her paternal grandmother; Diabetes in her father; Hyperlipidemia in her brother; Hypertension in her mother. There is no history of Heart attack, Sudden death, or Colon cancer.  ROS:   Please see the history of present illness.     All other systems reviewed and are negative.  EKGs/Labs/Other Studies Reviewed:    The following studies were reviewed today: Echo Dec 2020 Monitor Dec 2020  EKG:  EKG is not ordered today.  The ekg ordered 07/22/2019 demonstrates NSR-HR 66-LVH  Recent Labs: 06/17/2019: TSH 0.533 07/25/2019: Magnesium 1.9 10/30/2019: ALT 13; BUN  9; Creatinine, Ser 0.84; Hemoglobin 12.5; Platelets 304; Potassium 4.2; Sodium 143  Recent Lipid Panel    Component Value Date/Time   CHOL 228 (H) 09/08/2019 0949   TRIG 109 09/08/2019 0949   HDL 62 09/08/2019 0949   CHOLHDL 3.7 09/08/2019 0949   CHOLHDL 3.7 05/03/2015 1559   VLDL 27 05/03/2015 1559   LDLCALC 147 (H) 09/08/2019 0949   LDLDIRECT 129 (H) 04/06/2009 2038    Physical Exam:    VS:  BP (!) 170/93  Pulse 82   Ht 5\' 2"  (1.575 m)   Wt 206 lb 6.4 oz (93.6 kg)   SpO2 99%   BMI 37.75 kg/m     Wt Readings from Last 3 Encounters:  02/19/20 206 lb 6.4 oz (93.6 kg)  10/31/19 218 lb (98.9 kg)  10/30/19 216 lb 12.8 oz (98.3 kg)     GEN:  Overweight AA female, well developed in no acute distress HEENT: Normal NECK: No JVD; No carotid bruits CARDIAC: RRR, no murmurs, rubs, gallops RESPIRATORY:  Clear to auscultation without rales, wheezing or rhonchi  ABDOMEN: Soft, non-tender, non-distended MUSCULOSKELETAL:  No edema; No deformity  SKIN: Warm and dry NEUROLOGIC:  Alert and oriented x 3 PSYCHIATRIC:   Vaguely off affect- but alert and appropriate   ASSESSMENT:    Syncope and collapse 07/23/2020- echo and Holter unrevealing. Recurrent syncope reported in Dec 2020 (no details- no arrhythmia on monitor).  Atypical chest pain Low risk Myoview 2016, normal LVF by echo Dec 2020  Essential hypertension Controlled  Hyperlipidemia On low dose crestor  PLAN:    Unfortunately I cannot rescind her driving restriction recommendation based on her current history.  I suggested a Neurologic evaluation for non cardiac syncope and intermittent loss of awareness. Cardiology f/u PRN.   She doesn't need ASA based on her current history.   Medication Adjustments/Labs and Tests Ordered: Current medicines are reviewed at length with the patient today.  Concerns regarding medicines are outlined above.  Orders Placed This Encounter  Procedures  . Ambulatory referral to  Neurology  . EKG 12-Lead   No orders of the defined types were placed in this encounter.   Patient Instructions  Medication Instructions:  STOP the Aspirin  *If you need a refill on your cardiac medications before your next appointment, please call your pharmacy*   Lab Work: None ordered If you have labs (blood work) drawn today and your tests are completely normal, you will receive your results only by: Marland Kitchen MyChart Message (if you have MyChart) OR . A paper copy in the mail If you have any lab test that is abnormal or we need to change your treatment, we will call you to review the results.   Testing/Procedures: None ordered   Follow-Up: At [facility], you and your health needs are our priority.  As part of our continuing mission to provide you with exceptional heart care, we have created designated Provider Care Teams.  These Care Teams include your primary Cardiologist (physician) and Advanced Practice Providers (APPs -  Physician Assistants and Nurse Practitioners) who all work together to provide you with the care you need, when you need it.  We recommend signing up for the patient portal called "MyChart".  Sign up information is provided on this After Visit Summary.  MyChart is used to connect with patients for Virtual Visits (Telemedicine).  Patients are able to view lab/test results, encounter notes, upcoming appointments, etc.  Non-urgent messages can be sent to your provider as well.   To learn more about what you can do with MyChart, go to [email or web address].    Your next appointment:   As needed   Other Instructions A referral has been placed to [facility]. They will call you to make the appointment.     Signed, Kerin Ransom, PA-C  02/19/2020 11:35 AM    [facility]

## 2020-02-19 NOTE — Assessment & Plan Note (Signed)
07/23/2020- echo and Holter unrevealing. Recurrent syncope reported in Dec 2020 (no details- no arrhythmia on monitor).

## 2020-03-08 ENCOUNTER — Telehealth: Payer: Self-pay | Admitting: Cardiovascular Disease

## 2020-03-08 NOTE — Telephone Encounter (Signed)
I saw her 09/08/19 when she told me about the syncopal episode and tod her she couldn't drive for 6 months. If she's had no further syncope since I saw her I have no problem with her driving now.

## 2020-03-08 NOTE — Telephone Encounter (Signed)
Returned the call to the patient. She was calling to speak to Dr. Kennon Holter nurse about starting to drive again. At her last office visit it was advised:  Unfortunately I cannot rescind her driving restriction recommendation based on her current history.  I suggested a Neurologic evaluation for non cardiac syncope and intermittent loss of awareness. Cardiology f/u PRN.   She stated that she has an appointment with Neurology in the middle of June. She has been advised that cardiology had advised she not drive until she was seen by Neurology and then they can make that determination. She is now follow up "as needed" with cardiology.   She stated that she would still like to have Dr. Kennon Holter recommendations.

## 2020-03-08 NOTE — Telephone Encounter (Signed)
Patient states she would like to begin driving soon. Prior to beginning to drive, she is requesting to speak with Dr. Kennon Holter nurse. Please call.

## 2020-03-09 NOTE — Telephone Encounter (Signed)
The patient has been made aware that if she is still having any syncope episodes, feeling light headed or dizzy then she needs to wait until her neuro appointment for determination. She has verbalized her understanding.

## 2020-03-25 NOTE — Telephone Encounter (Signed)
Patient returns calls from March. Patient has not yet her covid vaccine. I advised patient on PCP recommendations below. Patient was appreciative and plans to schedule a vaccine apt.

## 2020-04-09 ENCOUNTER — Encounter: Payer: Self-pay | Admitting: Neurology

## 2020-04-09 ENCOUNTER — Ambulatory Visit: Payer: Medicare Other | Admitting: Neurology

## 2020-04-09 ENCOUNTER — Other Ambulatory Visit: Payer: Self-pay

## 2020-04-09 ENCOUNTER — Telehealth: Payer: Self-pay | Admitting: Neurology

## 2020-04-09 VITALS — BP 159/100 | HR 73 | Ht 62.0 in | Wt 205.0 lb

## 2020-04-09 DIAGNOSIS — R55 Syncope and collapse: Secondary | ICD-10-CM | POA: Diagnosis not present

## 2020-04-09 NOTE — Telephone Encounter (Signed)
[employer] medicare order sent to GI. No auth they will reach out to the patient to schedule.  

## 2020-04-09 NOTE — Progress Notes (Signed)
Reason for visit: Syncope  Referring physician: Dr. Milinda Cave is a 64 y.o. female  History of present illness:  [name] is a 64 year old right-handed black female with a history of a syncopal event that occurred around 25 July 2019.  The patient was having a conversation with someone and then suddenly blacked out without any warning, falling into a door and then down to the ground.  She was unconscious only for few seconds and then when she came to she continued her conversation as if nothing happened.  The patient has not had any recurrence of this event before or after.  She did have a brief event in April 2021 where she felt zoned out and had to reorient herself.  She feels spacey at times, this has been an issue that has been going on for greater than a year.  She reports no definite memory problems, she denies problems with getting lost with driving, she is able to manage her medications and appointments and do her finances without difficulty.  She denies any fatigue or drowsiness during the day, she sleeps well at night.  She has no headaches, she denies any dizziness, palpitations of the heart, or any numbness or weakness of the face, arms, legs.  The patient denies any balance issues or difficulty controlling the bowels or the bladder.  She has undergone a cardiac work-up that did not show any cardiac source of the blackout.  She is referred to this office for further evaluation.  Past Medical History:  Diagnosis Date  . Allergy   . Anxiety   . 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 Carpal tunnel syndrome, bilateral   . Chest pain   . Heart murmur    age 22 said had heart murmur.  . Hyperlipidemia   . Hypertension   . Normal cardiac stress test    Myoview stress test  . OAB (overactive bladder) 09/01/2015  . Osteopenia 05/2018   T score -1.1 FRAX 2.5%/ 0.1%    Past Surgical History:  Procedure Laterality Date  . COLONOSCOPY    . POLYPECTOMY      . 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 TOTAL HIP ARTHROPLASTY Left 10/01/2013   Procedure: TOTAL HIP ARTHROPLASTY;  Surgeon: Kerin Salen, MD;  Location: [facility];  Service: Orthopedics;  Laterality: Left;    Family History  Problem Relation Age of Onset  . Hypertension Mother   . Diabetes Father   . Hyperlipidemia Brother   . Breast cancer Paternal Grandmother   . Heart attack Neg Hx   . Sudden death Neg Hx   . Colon cancer Neg Hx     Social history:  reports that she has never smoked. She has never used smokeless tobacco. She reports that she does not drink alcohol and does not use drugs.  Medications:  Prior to Admission medications   Medication Sig Start Date End Date Taking? Authorizing Provider  albuterol (PROVENTIL HFA) 108 (90 Base) MCG/ACT inhaler INHALE TWO PUFFS BY MOUTH INTO THE LUNGS EVERY 6 HOURS AS NEEDED FOR WHEEZING OR SHORTNESS OF BREATH 12/16/19  Yes Tammi Klippel, Sherin, DO  amLODipine (NORVASC) 10 MG tablet Take 10 mg by mouth at bedtime.  04/09/20  Yes [provider]  capsaicin (ZOSTRIX) 0.025 % cream Apply topically 2 (two) times daily. 05/23/18  Yes Riccio, Levada Dy C, DO  cholecalciferol (VITAMIN D3) 25 MCG (1000 UNIT) tablet Take 1,000 Units  by mouth daily.   Yes [provider]  fluticasone (FLONASE) 50 MCG/ACT nasal spray USE ONE SPRAY(S) IN EACH NOSTRIL TWICE DAILY 01/15/17  Yes Bacigalupo, Dionne Bucy, MD  gabapentin (NEURONTIN) 100 MG capsule TAKE 1 CAPSULE BY MOUTH DAILY 07/09/19  Yes Tammi Klippel, Sherin, DO  loratadine (EQ ALLERGY RELIEF) 10 MG tablet Take 1 tablet (10 mg total) by mouth daily. 10/10/18  Yes Enid Derry, [place], DO  metoprolol tartrate (LOPRESSOR) 50 MG tablet Take 1 tablet (50 mg total) by mouth 2 (two) times daily. 12/18/18  Yes Caroline More, DO  rosuvastatin (CRESTOR) 5 MG tablet Take 5mg  every Monday and Friday. Patient not taking: Reported on 04/09/2020 12/08/19   Lorretta Harp, MD      Allergies  Allergen  Reactions  . Hydrocodone     Mood disturbance  . Simvastatin     REACTION: muscle aches, GI complaints    ROS:  Out of a complete 14 system review of symptoms, the patient complains only of the following symptoms, and all other reviewed systems are negative.  History of syncope Difficulty with focusing  Blood pressure (!) 159/100, pulse 73, height 5\' 2"  (1.575 m), weight 205 lb (93 kg).  Physical Exam  General: The patient is alert and cooperative at the time of the examination.  The patient is moderately obese.  Eyes: Pupils are equal, round, and reactive to light. Discs are flat bilaterally.  Neck: The neck is supple, no carotid bruits are noted.  Respiratory: The respiratory examination is clear.  Cardiovascular: The cardiovascular examination reveals a regular rate and rhythm, no obvious murmurs or rubs are noted.  Skin: Extremities are without significant edema.  Neurologic Exam  Mental status: The patient is alert and oriented x 3 at the time of the examination. The patient has apparent normal recent and remote memory, with an apparently normal attention span and concentration ability.  Cranial nerves: Facial symmetry is present. There is good sensation of the face to pinprick and soft touch bilaterally. The strength of the facial muscles and the muscles to head turning and shoulder shrug are normal bilaterally. Speech is well enunciated, no aphasia or dysarthria is noted. Extraocular movements are full. Visual fields are full. The tongue is midline, and the patient has symmetric elevation of the soft palate. No obvious hearing deficits are noted.  Motor: The motor testing reveals 5 over 5 strength of all 4 extremities. Good symmetric motor tone is noted throughout.  Sensory: Sensory testing is intact to pinprick, soft touch, vibration sensation, and position sense on all 4 extremities. No evidence of extinction is noted.  Coordination: Cerebellar testing reveals good  finger-nose-finger and heel-to-shin bilaterally.  Gait and station: Gait is normal. Tandem gait is normal. Romberg is negative. No drift is seen.  Reflexes: Deep tendon reflexes are symmetric, but are somewhat depressed bilaterally. Toes are downgoing bilaterally.  CT head 07/25/19:  IMPRESSION: Negative for age non contrast appearance of the brain. No acute traumatic injury identified.  * CT scan images were reviewed online. I agree with the written report.    Assessment/Plan:  1.  Episode of syncope  The patient has had one single event of syncope, but she describes some sensation of being spacey and having difficulty focusing at times.  She has had this for well over a year.  She denies any true memory problems.  The patient will be set up for MRI of the brain, she will have an EEG study.  We will follow her conservatively  over time, she will report back to [place] if another syncopal event occurs.  Jill Alexanders MD 04/09/2020 11:16 AM  [facility] [address] [place], [place] [address]  Phone [phone] Fax [phone]

## 2020-04-15 ENCOUNTER — Telehealth: Payer: Self-pay | Admitting: Cardiology

## 2020-04-15 NOTE — Telephone Encounter (Signed)
Left message for patient to call and schedule virtual visit with Kerin Ransom,, PA to discuss her blood pressure (per 04/14/20 staff message)

## 2020-04-16 NOTE — Telephone Encounter (Signed)
Spoke with patient to discuss scheduling virtual visit with Kerin Ransom, PA  To discuss the patient's blood pressure.  Visit scheduled 04/21/20 at 10:45 am.  Explained to paiient she will receive a call from the nurse around 10:30 am and will speak with Lurena Joiner around 10:45 am.  Patient voiced her understanding.

## 2020-04-20 ENCOUNTER — Telehealth: Payer: Self-pay

## 2020-04-20 NOTE — Telephone Encounter (Signed)
LVM for pt to call me back to see if pt could switch appt times (same day but switch to 10:45am appt slot)

## 2020-04-21 ENCOUNTER — Telehealth: Payer: Self-pay | Admitting: *Deleted

## 2020-04-21 ENCOUNTER — Telehealth (INDEPENDENT_AMBULATORY_CARE_PROVIDER_SITE_OTHER): Payer: Medicare Other | Admitting: Cardiology

## 2020-04-21 ENCOUNTER — Encounter: Payer: Self-pay | Admitting: Cardiology

## 2020-04-21 VITALS — BP 147/94 | HR 69 | Ht 61.0 in

## 2020-04-21 DIAGNOSIS — R0789 Other chest pain: Secondary | ICD-10-CM | POA: Diagnosis not present

## 2020-04-21 DIAGNOSIS — I10 Essential (primary) hypertension: Secondary | ICD-10-CM

## 2020-04-21 DIAGNOSIS — E785 Hyperlipidemia, unspecified: Secondary | ICD-10-CM

## 2020-04-21 DIAGNOSIS — Z87898 Personal history of other specified conditions: Secondary | ICD-10-CM

## 2020-04-21 MED ORDER — AMLODIPINE BESYLATE 10 MG PO TABS: 10.0000 mg | ORAL_TABLET | Freq: Every morning | ORAL | Status: DC

## 2020-04-21 NOTE — Telephone Encounter (Signed)
The patient has been called about the virtual appointment today with Kerin Ransom, PA. Instructions provided. The AVS will be mailed. The patient verbalized their understanding.

## 2020-04-21 NOTE — Patient Instructions (Addendum)
Medication Instructions:  Amlodipine: Take in the morning instead of the evening.  *If you need a refill on your cardiac medications before your next appointment, please call your pharmacy*   Lab Work: None ordered If you have labs (blood work) drawn today and your tests are completely normal, you will receive your results only by: Marland Kitchen MyChart Message (if you have MyChart) OR . A paper copy in the mail If you have any lab test that is abnormal or we need to change your treatment, we will call you to review the results.   Testing/Procedures: None ordered   Follow-Up: At [facility], you and your health needs are our priority.  As part of our continuing mission to provide you with exceptional heart care, we have created designated Provider Care Teams.  These Care Teams include your primary Cardiologist (physician) and Advanced Practice Providers (APPs -  Physician Assistants and Nurse Practitioners) who all work together to provide you with the care you need, when you need it.  We recommend signing up for the patient portal called "MyChart".  Sign up information is provided on this After Visit Summary.  MyChart is used to connect with patients for Virtual Visits (Telemedicine).  Patients are able to view lab/test results, encounter notes, upcoming appointments, etc.  Non-urgent messages can be sent to your provider as well.   To learn more about what you can do with MyChart, go to [email or web address].    Your next appointment:   3 week(s)  The format for your next appointment:   Virtual Visit   Provider:   Kerin Ransom, [place]   Other Instructions Please check your blood pressure 3 times weekly, 1-2 hours after your medications. Keep a log of these pressures.

## 2020-04-21 NOTE — Progress Notes (Signed)
Virtual Visit via Telephone Note   This visit type was conducted due to national recommendations for restrictions regarding the COVID-19 Pandemic (e.g. social distancing) in an effort to limit this patient's exposure and mitigate transmission in our community.  Due to her co-morbid illnesses, this patient is at least at moderate risk for complications without adequate follow up.  This format is felt to be most appropriate for this patient at this time.  The patient did not have access to video technology/had technical difficulties with video requiring transitioning to audio format only (telephone).  All issues noted in this document were discussed and addressed.  No physical exam could be performed with this format.  Please refer to the patient's chart for her  consent to telehealth for [facility].   The patient was identified using 2 identifiers.  Date:  04/21/2020   ID:  [name], DOB 1956/10/08, MRN [id number]  Patient Location: Home Provider Location: Home  PCP:  Sharion Settler, DO  Cardiologist:  Quay Burow, MD  Electrophysiologist:  None   Evaluation Performed:  Follow-Up Visit  Chief Complaint:  none  History of Present Illness:    [name] is a 64 y.o. 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  The patient says she was walking when she suddenly collapsed and suffered a minor head injury (small bump on her head). She had no lightheadedness, dizziness, palpitations, excessive sweating, or any other symptoms prior to her syncopal episode. She woke up andfinished her sentence that she was saying before she had her syncopal episode.  Head CT was negative. She was referred to Dr Gwenlyn Found for further evaluation. Echo was WNL.  30 day monitor showed no arrhythmia.  On 09/28/2019 while wearing the monitor the patient called in after she had another syncopal event.  The MD on call was unable to get in touch with her and her  monitor showed no arrhythmia.  When asked about this event today the patient can't remember any details but does not think she had any injury.  At some point she was told to stop her HCTZ thinking she may have had an orthostatic event.  I saw her in the office 02/19/2020, she had requested her driving privileges back.  She had not had recurrent syncope.  She did describe some vague events where she appeared to lose concentration and forgot where she was.  I referred her to neurology and she was seen by Dr. Jannifer Franklin.  EEG was ordered as well as an MRI, the EEG is pending and I do not believe she ever had the MRI.  When she saw Dr. Jannifer Franklin in the office her blood pressure was elevated.  In reviewing her recent blood pressures they also appear to be above goal.  She was contacted today to discuss this.  Since I saw her last in May she has had no further syncopal spells.  She says she feels well.  She was instructed to take her amlodipine at night.  She has not regularly been following her blood pressures.  She tells me her HCTZ was stopped at 1 point thinking it may have caused her syncopal spell.  The patient does not have symptoms concerning for COVID-19 infection (fever, chills, cough, or new shortness of breath).    Past Medical History:  Diagnosis Date  . Allergy   . Anxiety   . 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 Carpal tunnel  syndrome, bilateral   . Chest pain   . Heart murmur    age 4 said had heart murmur.  . Hyperlipidemia   . Hypertension   . Normal cardiac stress test    Myoview stress test  . OAB (overactive bladder) 09/01/2015  . Osteopenia 05/2018   T score -1.1 FRAX 2.5%/ 0.1%   Past Surgical History:  Procedure Laterality Date  . COLONOSCOPY    . POLYPECTOMY    . 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 TOTAL HIP ARTHROPLASTY Left 10/01/2013   Procedure: TOTAL HIP ARTHROPLASTY;  Surgeon: Kerin Salen, MD;  Location: [place];  Service: Orthopedics;   Laterality: Left;     Current Meds  Medication Sig  . albuterol (PROVENTIL HFA) 108 (90 Base) MCG/ACT inhaler INHALE TWO PUFFS BY MOUTH INTO THE LUNGS EVERY 6 HOURS AS NEEDED FOR WHEEZING OR SHORTNESS OF BREATH  . amLODipine (NORVASC) 10 MG tablet Take 10 mg by mouth at bedtime.   . capsaicin (ZOSTRIX) 0.025 % cream Apply topically 2 (two) times daily.  . cholecalciferol (VITAMIN D3) 25 MCG (1000 UNIT) tablet Take 1,000 Units by mouth daily.  . fluticasone (FLONASE) 50 MCG/ACT nasal spray USE ONE SPRAY(S) IN EACH NOSTRIL TWICE DAILY  . gabapentin (NEURONTIN) 100 MG capsule TAKE 1 CAPSULE BY MOUTH DAILY  . loratadine (EQ ALLERGY RELIEF) 10 MG tablet Take 1 tablet (10 mg total) by mouth daily.  . metoprolol tartrate (LOPRESSOR) 50 MG tablet Take 1 tablet (50 mg total) by mouth 2 (two) times daily.  . rosuvastatin (CRESTOR) 5 MG tablet Take 5mg  every Monday and Friday.     Allergies:   Hctz [hydrochlorothiazide], Hydrocodone, and Simvastatin   Social History   Tobacco Use  . Smoking status: Never Smoker  . Smokeless tobacco: Never Used  Vaping Use  . Vaping Use: Never used  Substance Use Topics  . Alcohol use: No    Alcohol/week: 0.0 standard drinks  . Drug use: No     Family Hx: The patient's family history includes Breast cancer in her paternal grandmother; Diabetes in her father; Hyperlipidemia in her brother; Hypertension in her mother. There is no history of Heart attack, Sudden death, or Colon cancer.  ROS:   Please see the history of present illness.    All other systems reviewed and are negative.   Prior CV studies:   The following studies were reviewed today:  Echo Dec 2020- IMPRESSIONS    1. Left ventricular ejection fraction, by visual estimation, is 55 to  60%. The left ventricle has normal function. There is mildly increased  left ventricular hypertrophy.  2. The average left ventricular global longitudinal strain is -18.0 %.  3. Global right ventricle  has normal systolic function.The right  ventricular size is normal. No increase in right ventricular wall  thickness.  4. Left atrial size was normal.  5. Right atrial size was normal.  6. The mitral valve is normal in structure. Trace mitral valve  regurgitation.  7. The tricuspid valve is normal in structure. Tricuspid valve  regurgitation is trivial.  8. The aortic valve is tricuspid. Aortic valve regurgitation is not  visualized. No evidence of aortic valve sclerosis or stenosis.  9. The pulmonic valve was not well visualized. Pulmonic valve  regurgitation is trivial.  10. Normal pulmonary artery systolic pressure.  11. The inferior vena cava is normal in size with greater than 50%  respiratory variability, suggesting right atrial pressure of 3 mmHg.  Labs/Other Tests and Data Reviewed:    EKG:  An ECG dated 02/17/2020 was personally reviewed today and demonstrated:  NSR, HR 72, LVH  Recent Labs: 06/17/2019: TSH 0.533 07/25/2019: Magnesium 1.9 10/30/2019: ALT 13; BUN 9; Creatinine, Ser 0.84; Hemoglobin 12.5; Platelets 304; Potassium 4.2; Sodium 143   Recent Lipid Panel Lab Results  Component Value Date/Time   CHOL 228 (H) 09/08/2019 09:49 AM   TRIG 109 09/08/2019 09:49 AM   HDL 62 09/08/2019 09:49 AM   CHOLHDL 3.7 09/08/2019 09:49 AM   CHOLHDL 3.7 05/03/2015 03:59 PM   LDLCALC 147 (H) 09/08/2019 09:49 AM   LDLDIRECT 129 (H) 04/06/2009 08:38 PM    Wt Readings from Last 3 Encounters:  04/09/20 205 lb (93 kg)  02/19/20 206 lb 6.4 oz (93.6 kg)  10/31/19 218 lb (98.9 kg)     Objective:    Vital Signs:  BP (!) 147/94   Pulse 69   Ht 5\' 1"  (1.549 m)   BMI 38.73 kg/m    VITAL SIGNS:  reviewed  ASSESSMENT & PLAN:    HTN- B/P appears to have drifted up since HCTZ was stopped.  I asked her to take her Norvasc in the morning and monitor her B/P over the next few weeks.  Syncope and collapse 07/23/2020- echo and Holter unrevealing. Recurrent syncope reported in  Dec 2020 (no details- no arrhythmia on monitor).  Atypical chest pain Low risk Myoview 2016, normal LVF by echo Dec 2020  Hyperlipidemia On low dose crestor  Plan: Change Amlodipine to Q am- monitor B/P over the next few weeks.   COVID-19 Education: The signs and symptoms of COVID-19 were discussed with the patient and how to seek care for testing (follow up with PCP or arrange E-visit).  The importance of social distancing was discussed today.  Time:   Today, I have spent 10 minutes with the patient with telehealth technology discussing the above problems.     Medication Adjustments/Labs and Tests Ordered: Current medicines are reviewed at length with the patient today.  Concerns regarding medicines are outlined above.   Tests Ordered: No orders of the defined types were placed in this encounter.   Medication Changes: No orders of the defined types were placed in this encounter.   Follow Up:  Virtual Visit  with me in 2-3 weeks  Signed, Kerin Ransom, PA-C  04/21/2020 10:58 AM    [place]

## 2020-04-26 ENCOUNTER — Telehealth: Payer: Self-pay | Admitting: Cardiology

## 2020-04-26 NOTE — Telephone Encounter (Signed)
      I went in pt's chart to see who called her today(04-26-20). It was Satira Sark to make a 3 weeks follow up appt with Kerin Ransom. I made the appt for the pt for 05-17-20.

## 2020-04-26 NOTE — Telephone Encounter (Signed)
Left message for patient to call and schedule 3 week virtual visit with Kerin Ransom, PAl

## 2020-05-04 ENCOUNTER — Ambulatory Visit: Payer: Medicare Other | Admitting: Neurology

## 2020-05-04 ENCOUNTER — Other Ambulatory Visit: Payer: Self-pay

## 2020-05-04 DIAGNOSIS — R55 Syncope and collapse: Secondary | ICD-10-CM

## 2020-05-05 ENCOUNTER — Telehealth: Payer: Self-pay | Admitting: Neurology

## 2020-05-05 NOTE — Telephone Encounter (Signed)
I called the patient.  The EEG study was unremarkable, I will call her when to get the results of the MRI of the brain.

## 2020-05-05 NOTE — Procedures (Signed)
    History:  [name] is a 64 year old patient with a history of a syncopal event that occurred on 25 July 2019.  The patient has not had any recurrence of syncope but at times she may feel zoned out and has to reorient herself.  The patient is being evaluated for these episodes.  This is a routine EEG.  No skull defects are noted.  Medications include Proventil, Norvasc, vitamin D, Flonase, gabapentin, Lopressor, and Crestor.  EEG classification: Normal awake  Description of the recording: The background rhythms of this recording consists of a fairly well modulated medium amplitude alpha rhythm of 9 Hz that is reactive to eye opening and closure. As the record progresses, the patient appears to remain in the waking state throughout the recording. Photic stimulation was performed, resulting in a bilateral and symmetric photic driving response. Hyperventilation was also performed, resulting in a minimal buildup of the background rhythm activities without significant slowing seen. At no time during the recording does there appear to be evidence of spike or spike wave discharges or evidence of focal slowing. EKG monitor shows no evidence of cardiac rhythm abnormalities with a heart rate of 72.  Impression: This is a normal EEG recording in the waking state. No evidence of ictal or interictal discharges are seen.

## 2020-05-11 ENCOUNTER — Encounter: Payer: Self-pay | Admitting: *Deleted

## 2020-05-17 ENCOUNTER — Encounter: Payer: Self-pay | Admitting: Cardiology

## 2020-05-17 ENCOUNTER — Other Ambulatory Visit: Payer: Self-pay

## 2020-05-17 ENCOUNTER — Ambulatory Visit: Payer: Medicare Other | Admitting: Cardiology

## 2020-05-17 VITALS — BP 134/82 | HR 75 | Ht 61.0 in | Wt 202.8 lb

## 2020-05-17 DIAGNOSIS — R413 Other amnesia: Secondary | ICD-10-CM | POA: Insufficient documentation

## 2020-05-17 DIAGNOSIS — F0390 Unspecified dementia without behavioral disturbance: Secondary | ICD-10-CM | POA: Insufficient documentation

## 2020-05-17 DIAGNOSIS — I10 Essential (primary) hypertension: Secondary | ICD-10-CM | POA: Diagnosis not present

## 2020-05-17 DIAGNOSIS — Z87898 Personal history of other specified conditions: Secondary | ICD-10-CM

## 2020-05-17 NOTE — Assessment & Plan Note (Signed)
B/P controlled with recent adjustment in her medications- (Amlodipine changed to Q AM 3 weeks ago)

## 2020-05-17 NOTE — Patient Instructions (Signed)
Medication Instructions:  Your physician recommends that you continue on your current medications as directed. Please refer to the Current Medication list given to you today.  *If you need a refill on your cardiac medications before your next appointment, please call your pharmacy*   Follow-Up: At [facility], you and your health needs are our priority.  As part of our continuing mission to provide you with exceptional heart care, we have created designated Provider Care Teams.  These Care Teams include your primary Cardiologist (physician) and Advanced Practice Providers (APPs -  Physician Assistants and Nurse Practitioners) who all work together to provide you with the care you need, when you need it.  We recommend signing up for the patient portal called "MyChart".  Sign up information is provided on this After Visit Summary.  MyChart is used to connect with patients for Virtual Visits (Telemedicine).  Patients are able to view lab/test results, encounter notes, upcoming appointments, etc.  Non-urgent messages can be sent to your provider as well.   To learn more about what you can do with MyChart, go to [email or web address].    Your next appointment:   12 month(s)  The format for your next appointment:   In Person  Provider:   You may see Quay Burow, MD or one of the following Advanced Practice Providers on your designated Care Team:    Kerin Ransom, PA-C  [place], [place]  Coletta Memos, [place]    Other Instructions Please call our office 2 months in advance to schedule your follow-up appointment with Dr. Gwenlyn Found.  It is OK for you to drive from a cardiology standpoint.

## 2020-05-17 NOTE — Assessment & Plan Note (Signed)
07/23/2020- echo and Holter unrevealing. Recurrent syncope reported in Dec 2020 (no details- no arrhythmia on monitor). No recurrent syncope since the- OK to drive from a cardiac standpoint but may be best to complete Neurology work up first

## 2020-05-17 NOTE — Progress Notes (Signed)
Cardiology Office Note:    Date:  05/17/2020   ID:  ALIZZON DIOGUARDI, DOB 03/07/1956, MRN [id number]  PCP:  Sharion Settler, DO  Cardiologist:  Quay Burow, MD  Electrophysiologist:  None   Referring MD: Sharion Settler, DO   No chief complaint on file.   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 The patient says she was walking when she suddenly collapsed. She suffered a minor head injury (small bump on her head). She had no lightheadedness, dizziness,palpitations,excessive sweating, or any other symptomsprior toher syncopal episode. She woke up andfinished her sentence that she was saying before she had her syncopal episode.Head CT was negative. She was referred to Dr Gwenlyn Found for further evaluation. Echo Dec 2020 was WNL. 30 day monitor Jan 2021 showed no arrhythmia.   On 09/28/2019 while wearing the monitor the patient called in after she had another syncopal event. The MD on call was unable to get in touch with her and her monitor showed no arrhythmia. I saw her in the office in f/u.  When asked about that event the patient couldn't remember any details but did not think she had any injury.  At some point she was told to stop her HCTZ thinking she may have had an orthostatic event.  I saw her in the office 02/19/2020, she had requested her driving privileges back.  She had not had recurrent syncope.  She did describe some vague events where she appeared to lose concentration and forgot where she was.  I referred her to neurology and she was seen by Dr. Jannifer Franklin.  EEG and MRI were ordered. The EEG was normal, MRI pending.  The patient is in the office today accompanied by her daughter.  The patient denies any further syncopal spells. She has a flat affect. She has occasional palpitations but no sustained tachycardia.  Her daughter expressed concerned about her  mother's "short term memory issues".  I suggested they follow up with Dr Jannifer Franklin about this after her MRI.    Past Medical History:  Diagnosis Date  . Allergy   . Anxiety   . 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 Carpal tunnel syndrome, bilateral   . Chest pain   . Heart murmur    age 9 said had heart murmur.  . Hyperlipidemia   . Hypertension   . Normal cardiac stress test    Myoview stress test  . OAB (overactive bladder) 09/01/2015  . Osteopenia 05/2018   T score -1.1 FRAX 2.5%/ 0.1%    Past Surgical History:  Procedure Laterality Date  . COLONOSCOPY    . POLYPECTOMY    . 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 TOTAL HIP ARTHROPLASTY Left 10/01/2013   Procedure: TOTAL HIP ARTHROPLASTY;  Surgeon: Kerin Salen, MD;  Location: [place];  Service: Orthopedics;  Laterality: Left;    Current Medications: Current Meds  Medication Sig  . albuterol (PROVENTIL HFA) 108 (90 Base) MCG/ACT inhaler INHALE TWO PUFFS BY MOUTH INTO THE LUNGS EVERY 6 HOURS AS NEEDED FOR WHEEZING OR SHORTNESS OF BREATH  . amLODipine (NORVASC) 10 MG tablet Take 1 tablet (10 mg total) by mouth in the morning.  . capsaicin (ZOSTRIX) 0.025 % cream Apply topically 2 (two) times daily.  . cholecalciferol (VITAMIN D3) 25 MCG (1000 UNIT) tablet Take 1,000 Units by mouth daily.  Marland Kitchen  fluticasone (FLONASE) 50 MCG/ACT nasal spray USE ONE SPRAY(S) IN EACH NOSTRIL TWICE DAILY  . gabapentin (NEURONTIN) 100 MG capsule TAKE 1 CAPSULE BY MOUTH DAILY  . loratadine (EQ ALLERGY RELIEF) 10 MG tablet Take 1 tablet (10 mg total) by mouth daily.  . metoprolol tartrate (LOPRESSOR) 50 MG tablet Take 1 tablet (50 mg total) by mouth 2 (two) times daily.  . rosuvastatin (CRESTOR) 5 MG tablet Take 5mg  every Monday and Friday.     Allergies:   Hctz [hydrochlorothiazide], Hydrocodone, and Simvastatin   Social History   Socioeconomic History  . Marital status: Divorced    Spouse name: Not on file  . Number of  children: Not on file  . Years of education: Not on file  . Highest education level: Not on file  Occupational History  . 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 Smoking status: Never Smoker  . Smokeless tobacco: Never Used  Vaping Use  . Vaping Use: Never used  Substance and Sexual Activity  . Alcohol use: No    Alcohol/week: 0.0 standard drinks  . Drug use: No  . Sexual activity: Yes    Partners: Male    Comment: 1ST INTERCOURSE- 71, PARTNERS - 45,  MARRIED- 5 YRS   Other Topics Concern  . Not on file  Social History Narrative   Lives alone, has visitors regularly   Right handed   Caffeine: 1 cup/day   Social Determinants of Health   Financial Resource Strain:   . Difficulty of Paying Living Expenses:   Food Insecurity:   . Worried About Charity fundraiser in the Last Year:   . Arboriculturist in the Last Year:   Transportation Needs:   . Film/video editor (Medical):   Marland Kitchen Lack of Transportation (Non-Medical):   Physical Activity:   . Days of Exercise per Week:   . Minutes of Exercise per Session:   Stress:   . Feeling of Stress :   Social Connections:   . Frequency of Communication with Friends and Family:   . Frequency of Social Gatherings with Friends and Family:   . Attends Religious Services:   . Active Member of Clubs or Organizations:   . Attends Archivist Meetings:   Marland Kitchen Marital Status:      Family History: The patient's family history includes Breast cancer in her paternal grandmother; Diabetes in her father; Hyperlipidemia in her brother; Hypertension in her mother. There is no history of Heart attack, Sudden death, or Colon cancer.  ROS:   Please see the history of present illness.     All other systems reviewed and are negative.  EKGs/Labs/Other Studies Reviewed:    The following studies were reviewed today: Echo  Dec 2020 Monitor Jan 2021  EKG:  EKG is not ordered today.  The ekg ordered 02/17/2020 demonstrates NSR, HR 76, LVH  Recent Labs: 06/17/2019: TSH 0.533 07/25/2019: Magnesium 1.9 10/30/2019: ALT 13; BUN 9; Creatinine, Ser 0.84; Hemoglobin 12.5; Platelets 304; Potassium 4.2; Sodium 143  Recent Lipid Panel    Component Value Date/Time   CHOL 228 (H) 09/08/2019 0949   TRIG 109 09/08/2019 0949   HDL 62 09/08/2019 0949   CHOLHDL 3.7 09/08/2019 0949   CHOLHDL 3.7 05/03/2015 1559   VLDL 27 05/03/2015 1559   LDLCALC 147 (H) 09/08/2019 0949   LDLDIRECT 129 (  H) 04/06/2009 2038    Physical Exam:    VS:  BP 134/82   Pulse 75   Ht 5\' 1"  (1.549 m)   Wt 202 lb 12.8 oz (92 kg)   SpO2 96%   BMI 38.32 kg/m     Wt Readings from Last 3 Encounters:  05/17/20 202 lb 12.8 oz (92 kg)  04/09/20 205 lb (93 kg)  02/19/20 206 lb 6.4 oz (93.6 kg)     GEN: Well nourished, well developed in no acute distress HEENT: Normal NECK: No JVD CARDIAC: RRR, no murmurs, rubs, gallops RESPIRATORY:  Clear to auscultation without rales, wheezing or rhonchi  ABDOMEN: Soft, non-tender, non-distended MUSCULOSKELETAL:  No edema; No deformity  SKIN: Warm and dry NEUROLOGIC:  Alert and oriented x 3 PSYCHIATRIC:  Flat affect   ASSESSMENT:    History of syncope 07/23/2020- echo and Holter unrevealing. Recurrent syncope reported in Dec 2020 (no details- no arrhythmia on monitor). No recurrent syncope since the- OK to drive from a cardiac standpoint but may be best to complete Neurology work up first  Essential hypertension B/P controlled with recent adjustment in her medications- (Amlodipine changed to Q AM 3 weeks ago)  Short-term memory loss F/U with Dr Jannifer Franklin after MRI  PLAN:    Same cardiac Rx.  F/U with Dr Jannifer Franklin after MRI. F/U Dr Gwenlyn Found in one year.   Medication Adjustments/Labs and Tests Ordered: Current medicines are reviewed at length with the patient today.  Concerns regarding medicines are  outlined above.  No orders of the defined types were placed in this encounter.  No orders of the defined types were placed in this encounter.   Patient Instructions  Medication Instructions:  Your physician recommends that you continue on your current medications as directed. Please refer to the Current Medication list given to you today.  *If you need a refill on your cardiac medications before your next appointment, please call your pharmacy*   Follow-Up: At [facility], you and your health needs are our priority.  As part of our continuing mission to provide you with exceptional heart care, we have created designated Provider Care Teams.  These Care Teams include your primary Cardiologist (physician) and Advanced Practice Providers (APPs -  Physician Assistants and Nurse Practitioners) who all work together to provide you with the care you need, when you need it.  We recommend signing up for the patient portal called "MyChart".  Sign up information is provided on this After Visit Summary.  MyChart is used to connect with patients for Virtual Visits (Telemedicine).  Patients are able to view lab/test results, encounter notes, upcoming appointments, etc.  Non-urgent messages can be sent to your provider as well.   To learn more about what you can do with MyChart, go to [email or web address].    Your next appointment:   12 month(s)  The format for your next appointment:   In Person  Provider:   You may see Quay Burow, MD or one of the following Advanced Practice Providers on your designated Care Team:    Kerin Ransom, PA-C  [place], [place]  Coletta Memos, [place]    Other Instructions Please call our office 2 months in advance to schedule your follow-up appointment with Dr. Gwenlyn Found.  It is OK for you to drive from a cardiology standpoint.     Signed, Kerin Ransom, PA-C  05/17/2020 1:21 PM    [facility]

## 2020-05-17 NOTE — Assessment & Plan Note (Signed)
F/U with Dr Jannifer Franklin after MRI

## 2020-05-19 ENCOUNTER — Other Ambulatory Visit: Payer: Self-pay

## 2020-05-20 NOTE — Telephone Encounter (Signed)
Appointment made.  .Chantille Navarrete R Angline Schweigert, CMA  

## 2020-05-26 ENCOUNTER — Other Ambulatory Visit: Payer: Self-pay

## 2020-05-28 MED ORDER — GABAPENTIN 100 MG PO CAPS: 100.0000 mg | ORAL_CAPSULE | Freq: Every day | ORAL | 0 refills | Status: DC

## 2020-06-03 ENCOUNTER — Other Ambulatory Visit: Payer: Self-pay

## 2020-06-03 ENCOUNTER — Ambulatory Visit (INDEPENDENT_AMBULATORY_CARE_PROVIDER_SITE_OTHER): Payer: Medicare Other | Admitting: Family Medicine

## 2020-06-03 ENCOUNTER — Encounter: Payer: Self-pay | Admitting: Family Medicine

## 2020-06-03 VITALS — BP 138/88 | HR 64 | Wt 205.0 lb

## 2020-06-03 DIAGNOSIS — H6980 Other specified disorders of Eustachian tube, unspecified ear: Secondary | ICD-10-CM

## 2020-06-03 DIAGNOSIS — E0590 Thyrotoxicosis, unspecified without thyrotoxic crisis or storm: Secondary | ICD-10-CM | POA: Diagnosis not present

## 2020-06-03 DIAGNOSIS — R1084 Generalized abdominal pain: Secondary | ICD-10-CM

## 2020-06-03 DIAGNOSIS — Z9109 Other allergy status, other than to drugs and biological substances: Secondary | ICD-10-CM

## 2020-06-03 DIAGNOSIS — H6990 Unspecified Eustachian tube disorder, unspecified ear: Secondary | ICD-10-CM

## 2020-06-03 DIAGNOSIS — R109 Unspecified abdominal pain: Secondary | ICD-10-CM | POA: Insufficient documentation

## 2020-06-03 DIAGNOSIS — J45909 Unspecified asthma, uncomplicated: Secondary | ICD-10-CM | POA: Diagnosis not present

## 2020-06-03 DIAGNOSIS — F4321 Adjustment disorder with depressed mood: Secondary | ICD-10-CM

## 2020-06-03 DIAGNOSIS — M792 Neuralgia and neuritis, unspecified: Secondary | ICD-10-CM

## 2020-06-03 MED ORDER — FLUTICASONE PROPIONATE 50 MCG/ACT NA SUSP: 1.0000 | NASAL | 3 refills | Status: DC | PRN

## 2020-06-03 NOTE — Assessment & Plan Note (Signed)
Patient has been having about 3 months of intermittent generalized abdominal pain and bloating.  States she often forgets to eat breakfast which causes her to feel more bloated.  Believes this may also be related to recent life stressors.  She recently had a colonoscopy in January.  Benign abdominal examination.  Since this has been ongoing since the passing of her brother without any excessive diarrhea, constipation, hematochezia, weight loss/gain, I do believe this could be related to her recent life stressors.  Recommended digestive advantage intensive bowel support supplements to help with pain and bloating sensation.  Also discussed resources available to help with coping, she is not interested at this time but will let me know if she changes her mind.

## 2020-06-03 NOTE — Assessment & Plan Note (Signed)
Have sent in a prescription for Gabapentin 100mg . Advised patient to take daily in addition to Capsaicin cream since this seems to help.  Suspected that this is neuropathic in origin.  Likely related to prior history of shingles.  No rash or abnormalities noted on physical exam.

## 2020-06-03 NOTE — Assessment & Plan Note (Signed)
Elevated calcium of 11 in January of this year.  We will repeat blood work today to assess calcium and PTH levels.  Have sent in a referral for endocrinology.  Of note, patient has recently been switched off of HCTZ to Norvasc.  We will plan for close follow-up in 2 weeks to discuss results.

## 2020-06-03 NOTE — Assessment & Plan Note (Signed)
History of subclinical hyperthyroidism.  Last TSH in 2020 was normal.  We will repeat TSH today.  Family history of thyroid disorders

## 2020-06-03 NOTE — Assessment & Plan Note (Signed)
Patient is still mourning the loss of her brother who passed 2 months ago.  Feels as though she is handling this well on her own and with the support of her church and prayer partner.  Is not interested in any counseling or medication options at this time.  She is not suicidal or homicidal.  Advised patient to please let me know if she changes her mind and would like to explore other options to cope with this.

## 2020-06-03 NOTE — Progress Notes (Signed)
SUBJECTIVE:   CHIEF COMPLAINT / HPI: Medication refill, stomach pain, grief  Medication Refill  Left upper back pain Patient would like refill of her Gabapentin. States that she has had 2-weeks of itchy and burning sensation over her left scapula.  Had a history of similar symptoms last fall for which the gabapentin and capsaicin cream helped.  History of shingles in college, though does not remember where.  She has not used the gabapentin since last year but feels as though she is having another recurrence and would benefit from it again.  Also requests a refill for Flonase for seasonal allergies.  Stomach Pain  Patient notes intermittent generalized abdominal pain and bloating.  Bloating seems to be worse when she misses breakfast.  Feels that it could be her "subconscious".  Shares recent life stressors including the recent passing of her brother in June. Also tells me that her daughter recently had a baby 3 months ago that was also an unexpected surprise.  Has fairly regular bowel movements.  Denies constipation.  1 day of loose stools last week.  No bloody stools.  Had a colonoscopy in January of this year.  Denies reflux symptoms though states she is taking over-the-counter famotidine which seems to help.  Also walking 3 times a week which she feels is helping.  Grief Patient tells me that her brother passed 2 months ago.  Feels as though she is handling this well.  Has good support in her church and prayer partners.  Does not want counseling or medication at this time.  No SI/HI.  PERTINENT  PMH / PSH:  Past Medical History:  Diagnosis Date  . Allergy   . Anxiety   . 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 Carpal tunnel syndrome, bilateral   . Chest pain   . Heart murmur    age 6 said had heart murmur.  . Hyperlipidemia   . Hypertension   . Normal cardiac stress test    Myoview stress test  . OAB (overactive bladder) 09/01/2015  . Osteopenia 05/2018   T score -1.1  FRAX 2.5%/ 0.1%     OBJECTIVE:   BP 138/88   Pulse 64   Wt 93 kg   SpO2 98%   BMI 38.73 kg/m   Physical Exam Constitutional:      General: She is not in acute distress.    Appearance: Normal appearance.  HENT:     Head: Normocephalic.  Cardiovascular:     Rate and Rhythm: Normal rate and regular rhythm.     Heart sounds: Normal heart sounds.  Pulmonary:     Effort: Pulmonary effort is normal.     Breath sounds: Normal breath sounds.  Abdominal:     General: Bowel sounds are normal. There is no distension.     Palpations: Abdomen is soft. There is no mass.     Tenderness: There is no abdominal tenderness. There is no guarding or rebound.  Musculoskeletal:     Cervical back: Neck supple.  Skin:    General: Skin is warm and dry.  Neurological:     General: No focal deficit present.     Mental Status: She is alert.  Psychiatric:        Mood and Affect: Mood normal.        Behavior: Behavior normal.      ASSESSMENT/PLAN:   Neuropathic pain Have sent in a prescription for Gabapentin 100mg . Advised patient to take daily in  addition to Capsaicin cream since this seems to help.  Suspected that this is neuropathic in origin.  Likely related to prior history of shingles.  No rash or abnormalities noted on physical exam.  Hypercalcemia Elevated calcium of 11 in January of this year.  We will repeat blood work today to assess calcium and PTH levels.  Have sent in a referral for endocrinology.  Of note, patient has recently been switched off of HCTZ to Norvasc.  We will plan for close follow-up in 2 weeks to discuss results.  Subclinical hyperthyroidism History of subclinical hyperthyroidism.  Last TSH in 2020 was normal.  We will repeat TSH today.  Family history of thyroid disorders  Feeling grief Patient is still mourning the loss of her brother who passed 2 months ago.  Feels as though she is handling this well on her own and with the support of her church and prayer  partner.  Is not interested in any counseling or medication options at this time.  She is not suicidal or homicidal.  Advised patient to please let me know if she changes her mind and would like to explore other options to cope with this.  Abdominal pain Patient has been having about 3 months of intermittent generalized abdominal pain and bloating.  States she often forgets to eat breakfast which causes her to feel more bloated.  Believes this may also be related to recent life stressors.  She recently had a colonoscopy in January.  Benign abdominal examination.  Since this has been ongoing since the passing of her brother without any excessive diarrhea, constipation, hematochezia, weight loss/gain, I do believe this could be related to her recent life stressors.  Recommended digestive advantage intensive bowel support supplements to help with pain and bloating sensation.  Also discussed resources available to help with coping, she is not interested at this time but will let me know if she changes her mind.     [name], [place]

## 2020-06-03 NOTE — Patient Instructions (Addendum)
It was wonderful to see you today.  Please bring ALL of your medications with you to every visit.   Today we talked about:  1.  Your medication refill.  I have sent in a refill for your gabapentin.  Please be sure to take this every day.  I also sent in a refill for your Flonase.  If for some reason these medications are not at your pharmacy please contact me.  2.  I recommend a supplement called digestive advantage intensive bowel support.  This can be found over-the-counter.  It may help with occasional bloating and gas pains.  Continue to do your weekly walks!  3.  We talked about the recent passing of your brother.  I'm so sorry, again.  Please let me know if there is anything I can do to further help you.  I am happy to provide resources for grief counseling or discuss medication options if you feel that you need them.  4.  Today we did blood work to check your calcium and thyroid levels.  I will either call you or send you a message with the results.  I will also put in a referral for endocrinology.  Thank you for choosing [facility].   Please call [phone] with any questions about today's appointment.  Please be sure to schedule follow up for two weeks at the front  desk before you leave today.   Sharion Settler, DO PGY-1 Family Medicine    Hypercalcemia Hypercalcemia is when the level of calcium in a person's blood is above normal. The body needs calcium to make bones and keep them strong. Calcium also helps the muscles, nerves, brain, and heart work the way they should. Most of the calcium in the body is in the bones. There is also some calcium in the blood. Hypercalcemia can happen when calcium comes out of the bones, or when the kidneys are not able to remove calcium from the blood. Hypercalcemia can be mild or severe. What are the causes? There are many possible causes of hypercalcemia. Common causes of this condition include:  Hyperparathyroidism.  This is a condition in which the body produces too much parathyroid hormone. There are four parathyroid glands in your neck. These glands produce a chemical messenger (hormone) that helps the body absorb calcium from foods and helps your bones release calcium.  Certain kinds of cancer. Less common causes of hypercalcemia include:  Getting too much calcium or vitamin D from your diet.  Kidney failure.  Hyperthyroidism.  Severe dehydration.  Being on bed rest or being inactive for a long time.  Certain medicines.  Infections. What increases the risk? You are more likely to develop this condition if you:  Are female.  Are 54 years of age or older.  Have a family history of hypercalcemia. What are the signs or symptoms? Mild hypercalcemia that starts slowly may not cause symptoms. Severe, sudden hypercalcemia is more likely to cause symptoms, such as:  Being more thirsty than usual.  Needing to urinate more often than usual.  Abdominal pain.  Nausea and vomiting.  Constipation.  Muscle pain, twitching, or weakness.  Feeling very tired. How is this diagnosed?  Hypercalcemia is usually diagnosed with a blood test. You may also have tests to help determine what is causing this condition, such as imaging tests and more blood tests. How is this treated? Treatment for hypercalcemia depends on the cause. Treatment may include:  Receiving fluids through an IV.  Medicines that: ?  Keep calcium levels steady after receiving fluids (loop diuretics). ? Keep calcium in your bones (bisphosphonates). ? Lower the calcium level in your blood.  Surgery to remove overactive parathyroid glands.  A procedure that filters your blood to correct calcium levels (hemodialysis). Follow these instructions at home:   Take over-the-counter and prescription medicines only as told by your health care provider.  Follow instructions from your health care provider about eating or drinking  restrictions.  Drink enough fluid to keep your urine pale yellow.  Stay active. Weight-bearing exercise helps to keep calcium in your bones. Follow instructions from your health care provider about what type and level of exercise is safe for you.  Keep all follow-up visits as told by your health care provider. This is important. Contact a health care provider if you have:  A fever.  A heartbeat that is irregular or very fast.  Changes in mood, memory, or personality. Get help right away if you:  Have severe abdominal pain.  Have chest pain.  Have trouble breathing.  Become very confused and sleepy.  Lose consciousness. Summary  Hypercalcemia is when the level of calcium in a person's blood is above normal. The body needs calcium to make bones and keep them strong. Calcium also helps the muscles, nerves, brain, and heart work the way they should.  There are many possible causes of hypercalcemia, and treatment depends on the cause.  Take over-the-counter and prescription medicines only as told by your health care provider.  Follow instructions from your health care provider about eating or drinking restrictions. This information is not intended to replace advice given to you by your health care provider. Make sure you discuss any questions you have with your health care provider. Document Revised: 10/29/2018 Document Reviewed: 07/08/2018 Elsevier Patient Education  2020 [employer].

## 2020-06-04 LAB — BASIC METABOLIC PANEL
BUN/Creatinine Ratio: 11 — ABNORMAL LOW (ref 12–28)
BUN: 7 mg/dL — ABNORMAL LOW (ref 8–27)
CO2: 24 mmol/L (ref 20–29)
Calcium: 10.4 mg/dL — ABNORMAL HIGH (ref 8.7–10.3)
Chloride: 108 mmol/L — ABNORMAL HIGH (ref 96–106)
Creatinine, Ser: 0.62 mg/dL (ref 0.57–1.00)
GFR calc Af Amer: 111 mL/min/{1.73_m2} (ref >59)
GFR calc non Af Amer: 96 mL/min/{1.73_m2} (ref >59)
Glucose: 75 mg/dL (ref 65–99)
Potassium: 4.1 mmol/L (ref 3.5–5.2)
Sodium: 143 mmol/L (ref 134–144)

## 2020-06-04 LAB — PTH, INTACT AND CALCIUM: PTH: 49 pg/mL (ref 15–65)

## 2020-06-04 LAB — TSH: TSH: 0.381 u[IU]/mL — ABNORMAL LOW (ref 0.450–4.500)

## 2020-06-23 ENCOUNTER — Encounter: Payer: Self-pay | Admitting: Neurology

## 2020-07-06 ENCOUNTER — Encounter: Payer: Self-pay | Admitting: Family Medicine

## 2020-07-06 ENCOUNTER — Other Ambulatory Visit: Payer: Self-pay

## 2020-07-06 ENCOUNTER — Ambulatory Visit (INDEPENDENT_AMBULATORY_CARE_PROVIDER_SITE_OTHER): Payer: Medicare Other | Admitting: Family Medicine

## 2020-07-06 VITALS — BP 130/82 | HR 72 | Wt 207.6 lb

## 2020-07-06 DIAGNOSIS — Z23 Encounter for immunization: Secondary | ICD-10-CM

## 2020-07-06 DIAGNOSIS — E0590 Thyrotoxicosis, unspecified without thyrotoxic crisis or storm: Secondary | ICD-10-CM | POA: Diagnosis not present

## 2020-07-06 DIAGNOSIS — R7989 Other specified abnormal findings of blood chemistry: Secondary | ICD-10-CM | POA: Diagnosis not present

## 2020-07-06 NOTE — Assessment & Plan Note (Signed)
TSH 0.381 in August. Plan to check free T4 today. Patient with history of subclinical hyperthyroidism in 2016. No nodules or goiter seen or felt on examination today. Suspect this will be subclinical hyperthyroidism. No change in energy levels, appetite, bowel movements. Will arrange follow up for patient if T4 levels elevated to discuss medication options.

## 2020-07-06 NOTE — Assessment & Plan Note (Signed)
Patient received flu vaccination today 

## 2020-07-06 NOTE — Patient Instructions (Addendum)
It was wonderful to see you today! Please keep on walking!  Please bring ALL of your medications with you to every visit.   Today we talked about:  Your thyroid hormone levels. We checked your blood today to measure the amount of thyroid hormone in your blood. I will call you or send you a message with the results.   You also received your Flu shot today!  Thank you for choosing [facility].   Please call [phone] with any questions about today's appointment.  Please be sure to schedule follow up at the front  desk before you leave today.   Sharion Settler, DO PGY-1 Family Medicine    Thyroxine Test Why am I having this test? The thyroid is a gland in the lower front of the neck. It makes hormones that affect many body parts and systems, including the system that affects how quickly the body burns fuel for energy (metabolism). You may have a thyroxine test:  To help manage treatment for an underactive thyroid (hypothyroidism) or an overactive thyroid (hyperthyroidism).  To help diagnose hypothyroidism or hyperthyroidism. If you have possible symptoms of these conditions, you may have this test done. ? Symptoms of hypothyroidism include:  Fatigue.  Weight gain.  Dry skin.  Cold intolerance. ? Symptoms of hyperthyroidism include:  Tremors.  Weight loss.  Anxiety.  Heat intolerance.  If you are pregnant and have thyroid disease. You may have this test to make sure your hormone levels remain normal during pregnancy.  To help diagnose other conditions that affect thyroid function. Newborn babies may have this test done to screen for hypothyroidism that is present at birth (congenital). What is being tested? This test measures the amount of total thyroxine (T4) in the blood. T4 is the main hormone made by the thyroid. Some T4 is attached (bound) to proteins in the blood. Some remains free (free T4). Your health care provider may test you for total  T4, free T4, or both. What kind of sample is taken?     A blood sample is required for this test. It is usually collected by inserting a needle into a blood vessel. For newborns, a small amount of blood may be collected from the umbilical cord, or by using a small needle to prick the heel (heel stick). How do I prepare for this test? Follow instructions from your health care provider about changing or stopping your regular medicines. Many medicines can affect thyroid hormones, including birth control pills, estrogen, and aspirin. Tell a health care provider about:  All medicines you are taking, including vitamins, herbs, eye drops, creams, and over-the-counter medicines.  Any blood disorders you have.  Any surgeries you have had.  Any medical conditions you have.  Whether you are pregnant or may be pregnant.  Any recent illness or stress. How are the results reported? Your test results will be reported as a value that indicates how much T4 is in your blood. Your health care provider will compare your results to normal ranges that were established after testing a large group of people (reference ranges). Reference ranges may vary among labs and hospitals. Reference ranges for free T4 vary by age. Common reference ranges are:  45-40 days old: 2-6 ng/dL or 26-68 pmol/L (SI units).  2 weeks to 63 years old: 0.8-2 ng/dL or 10-26 pmol/L (SI units).  Adult: 0.8-2.8 ng/dL or 10-36 pmol/L (SI units). Reference ranges for total T4 vary by age and gender. Common reference ranges are:  62-14 days old: 11-22 mcg/dL.  37-60 weeks old: 10-16 mcg/dL.  1-12 months old: 8-16 mcg/dL.  69-14 years old: 7-15 mcg/dL.  61-62 years old: 6-13 mcg/dL.  38-4 years old: 5-12 mcg/dL.  Adult female: 4-12 mcg/dL.  Adult female: 5-12 mcg/dL.  Any adult older than 60 years: 5-11 mcg/dL.  Pregnant female: 9-14 mcg/dL. What do the results mean? Results that are within your reference range are considered  normal. This means that you have a normal amount of T4 in your blood. Results that are higher than your reference range mean that you have too much T4 in your blood. This may mean that:  You have hyperthyroidism.  You have a thyroid condition called Graves' disease.  You have other conditions that affect your thyroid function, such as thyroid cancer, thyroid goiter, or thyroiditis.  Your thyroid medicine dosage is too low. Results that are lower than your reference range mean that you have too little T4 in your blood. This may mean that:  You have hypothyroidism.  You have problems with your pituitary gland function.  You have a thyroid condition called Hashimoto's thyroiditis.  You have kidney failure.  Your thyroid medicine dosage is too high. You will need more tests to confirm a diagnosis. Talk with your health care provider about what your results mean. Questions to ask your health care provider Ask your health care provider, or the department that is doing the test:  When will my results be ready?  How will I get my results?  What are my treatment options?  What other tests do I need?  What are my next steps? Summary  The thyroid is a gland in the lower front of the neck. It makes hormones that affect many body parts and systems, including the system that affects how quickly your body burns fuel for energy (metabolism).  This test measures the amount of total thyroxine (T4) in your blood. T4 is the main hormone made by your thyroid.  Some T4 is attached (bound) to proteins in your blood. Some remains free (free T4). Your health care provider may test you for total T4, free T4, or both. This information is not intended to replace advice given to you by your health care provider. Make sure you discuss any questions you have with your health care provider. Document Revised: 12/31/2017 Document Reviewed: 07/03/2017 Elsevier Patient Education  [employer].

## 2020-07-06 NOTE — Progress Notes (Signed)
    SUBJECTIVE:   CHIEF COMPLAINT / HPI: Follow up TSH labs  Follow up TSH Patient presents today for a follow up on her TSH level from last visit. States she has been doing well. Feels her mind has been clear and attributes this to walking regularly. Also feels the walking has improved her abdominal pain. Has no complaints. Denies denies appetite changes, energy changes, diarrhea or constipation.  PERTINENT  PMH / PSH:  Past Medical History:  Diagnosis Date  . Allergy   . Anxiety   . 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 Carpal tunnel syndrome, bilateral   . Chest pain   . Heart murmur    age 39 said had heart murmur.  . Hyperlipidemia   . Hypertension   . Normal cardiac stress test    Myoview stress test  . OAB (overactive bladder) 09/01/2015  . Osteopenia 05/2018   T score -1.1 FRAX 2.5%/ 0.1%     OBJECTIVE:   BP 130/82   Pulse 72   Wt 207 lb 9.6 oz (94.2 kg)   SpO2 98%   BMI 39.23 kg/m   Physical Exam Constitutional:      General: She is not in acute distress.    Appearance: Normal appearance.  HENT:     Head: Normocephalic.  Neck:     Comments: Thyroid gland without palpable nodule Cardiovascular:     Rate and Rhythm: Normal rate and regular rhythm.     Pulses: Normal pulses.     Heart sounds: Normal heart sounds. No murmur heard.   Pulmonary:     Effort: Pulmonary effort is normal. No respiratory distress.     Breath sounds: Normal breath sounds. No stridor. No wheezing or rales.  Musculoskeletal:        General: Normal range of motion.     Cervical back: Neck supple.  Skin:    General: Skin is warm and dry.  Neurological:     Mental Status: She is alert.  Psychiatric:        Mood and Affect: Mood normal.      ASSESSMENT/PLAN:   Low serum thyroid stimulating hormone (TSH) TSH 0.381 in August. Plan to check free T4 today. Patient with history of subclinical hyperthyroidism in 2016. No nodules or goiter seen or felt on examination  today. Suspect this will be subclinical hyperthyroidism. No change in energy levels, appetite, bowel movements. Will arrange follow up for patient if T4 levels elevated to discuss medication options.   Flu vaccine need Patient received flu vaccination today.      Sharion Settler, [place]

## 2020-07-07 ENCOUNTER — Other Ambulatory Visit: Payer: Self-pay | Admitting: Family Medicine

## 2020-07-07 LAB — T4, FREE: Free T4: 0.92 ng/dL (ref 0.82–1.77)

## 2020-07-12 ENCOUNTER — Telehealth: Payer: Self-pay | Admitting: *Deleted

## 2020-07-12 NOTE — Telephone Encounter (Signed)
Pt left message on referral line stating that per her labs the endocrinology referral wasn't necessary.  She would like to get some imaging (xray/ultrasound) to see what this shows.  Patient states that there have been no changes in her symptoms.  Will check with provider before cancelling this referral for the endocrinologist.  [facility]

## 2020-07-12 NOTE — Telephone Encounter (Signed)
I had called the patient when her results came back and I felt that I had prematurely made the referral to endocrinology. I advised patient that she could cancel the appointment at that time as her calcium had come down and her thyroid levels are within normal range. I do not think she needs any further work up such as imaging as her lab work was stable. Thank you.

## 2020-07-13 NOTE — Telephone Encounter (Signed)
Will forward to team to update patient.  Newman Waren,CMA

## 2020-07-27 ENCOUNTER — Ambulatory Visit: Payer: Medicare Other | Admitting: Endocrinology

## 2020-07-28 ENCOUNTER — Other Ambulatory Visit: Payer: Self-pay | Admitting: Cardiovascular Disease

## 2020-07-28 NOTE — Telephone Encounter (Signed)
Rx request sent to pharmacy.  

## 2020-08-02 ENCOUNTER — Other Ambulatory Visit: Payer: Self-pay | Admitting: *Deleted

## 2020-08-02 NOTE — Telephone Encounter (Signed)
Also rx for hydrochlorothiazide 25mg  tabs. Not on med list, please advise. Jerzey Komperda Kennon Holter, CMA

## 2020-08-03 MED ORDER — METOPROLOL TARTRATE 50 MG PO TABS: 50.0000 mg | ORAL_TABLET | Freq: Two times a day (BID) | ORAL | 3 refills | Status: DC

## 2020-08-04 ENCOUNTER — Telehealth: Payer: Self-pay | Admitting: *Deleted

## 2020-08-04 NOTE — Telephone Encounter (Signed)
Rx request for hydrochlorothiazide tabs 25mg . Desiree Daise Kennon Holter, CMA

## 2020-08-09 ENCOUNTER — Other Ambulatory Visit: Payer: Self-pay | Admitting: *Deleted

## 2020-08-09 MED ORDER — GABAPENTIN 100 MG PO CAPS: 100.0000 mg | ORAL_CAPSULE | Freq: Every day | ORAL | 0 refills | Status: DC

## 2020-08-11 ENCOUNTER — Telehealth: Payer: Self-pay | Admitting: Family Medicine

## 2020-08-11 ENCOUNTER — Other Ambulatory Visit: Payer: Self-pay | Admitting: Family Medicine

## 2020-08-11 NOTE — Telephone Encounter (Signed)
Called patient to schedule AWV, if patient calls back please assist in scheduling this. Any questions please ask me. Thanks

## 2020-08-22 ENCOUNTER — Other Ambulatory Visit: Payer: Self-pay | Admitting: Family Medicine

## 2020-08-22 DIAGNOSIS — L858 Other specified epidermal thickening: Secondary | ICD-10-CM

## 2020-09-02 ENCOUNTER — Ambulatory Visit (INDEPENDENT_AMBULATORY_CARE_PROVIDER_SITE_OTHER): Payer: Medicare Other

## 2020-09-02 ENCOUNTER — Other Ambulatory Visit: Payer: Self-pay

## 2020-09-02 VITALS — BP 146/84 | HR 71 | Ht 62.0 in | Wt 205.0 lb

## 2020-09-02 DIAGNOSIS — Z0000 Encounter for general adult medical examination without abnormal findings: Secondary | ICD-10-CM | POA: Diagnosis not present

## 2020-09-02 NOTE — Patient Instructions (Signed)
You spoke to [name], [place]  for your annual wellness visit.  We discussed goals: Goals     Blood Pressure < 140/90     DIET - INCREASE WATER INTAKE      We also discussed recommended health maintenance. As discussed, you are up to date with everything!   Health Maintenance  Topic Date Due   PAP SMEAR-Modifier  05/28/2021   TETANUS/TDAP  09/19/2021   MAMMOGRAM  12/31/2021   COLONOSCOPY  10/30/2022   INFLUENZA VACCINE  Completed   COVID-19 Vaccine  Completed   Hepatitis C Screening  Completed   HIV Screening  Completed   Fill out the advance directive packet.  You will be due for your booster in April. Keep up the good work walking!  Health Maintenance, Female Adopting a healthy lifestyle and getting preventive care are important in promoting health and wellness. Ask your health care provider about:  The right schedule for you to have regular tests and exams.  Things you can do on your own to prevent diseases and keep yourself healthy. What should I know about diet, weight, and exercise? Eat a healthy diet   Eat a diet that includes plenty of vegetables, fruits, low-fat dairy products, and lean protein.  Do not eat a lot of foods that are high in solid fats, added sugars, or sodium. Maintain a healthy weight Body mass index (BMI) is used to identify weight problems. It estimates body fat based on height and weight. Your health care provider can help determine your BMI and help you achieve or maintain a healthy weight. Get regular exercise Get regular exercise. This is one of the most important things you can do for your health. Most adults should:  Exercise for at least 150 minutes each week. The exercise should increase your heart rate and make you sweat (moderate-intensity exercise).  Do strengthening exercises at least twice a week. This is in addition to the moderate-intensity exercise.  Spend less time sitting. Even light physical activity can be  beneficial. Watch cholesterol and blood lipids Have your blood tested for lipids and cholesterol at 63 years of age, then have this test every 5 years. Have your cholesterol levels checked more often if:  Your lipid or cholesterol levels are high.  You are older than 63 years of age.  You are at high risk for heart disease. What should I know about cancer screening? Depending on your health history and family history, you may need to have cancer screening at various ages. This may include screening for:  Breast cancer.  Cervical cancer.  Colorectal cancer.  Skin cancer.  Lung cancer. What should I know about heart disease, diabetes, and high blood pressure? Blood pressure and heart disease  High blood pressure causes heart disease and increases the risk of stroke. This is more likely to develop in people who have high blood pressure readings, are of African descent, or are overweight.  Have your blood pressure checked: ? Every 3-5 years if you are 57-33 years of age. ? Every year if you are 11 years old or older. Diabetes Have regular diabetes screenings. This checks your fasting blood sugar level. Have the screening done:  Once every three years after age 35 if you are at a normal weight and have a low risk for diabetes.  More often and at a younger age if you are overweight or have a high risk for diabetes. What should I know about preventing infection? Hepatitis B If you  have a higher risk for hepatitis B, you should be screened for this virus. Talk with your health care provider to find out if you are at risk for hepatitis B infection. Hepatitis C Testing is recommended for:  Everyone born from [birth year] through 1964.  Anyone with known risk factors for hepatitis C. Sexually transmitted infections (STIs)  Get screened for STIs, including gonorrhea and chlamydia, if: ? You are sexually active and are younger than 63 years of age. ? You are older than 63 years of age and  your health care provider tells you that you are at risk for this type of infection. ? Your sexual activity has changed since you were last screened, and you are at increased risk for chlamydia or gonorrhea. Ask your health care provider if you are at risk.  Ask your health care provider about whether you are at high risk for HIV. Your health care provider may recommend a prescription medicine to help prevent HIV infection. If you choose to take medicine to prevent HIV, you should first get tested for HIV. You should then be tested every 3 months for as long as you are taking the medicine. Pregnancy  If you are about to stop having your period (premenopausal) and you may become pregnant, seek counseling before you get pregnant.  Take 400 to 800 micrograms (mcg) of folic acid every day if you become pregnant.  Ask for birth control (contraception) if you want to prevent pregnancy. Osteoporosis and menopause Osteoporosis is a disease in which the bones lose minerals and strength with aging. This can result in bone fractures. If you are 61 years old or older, or if you are at risk for osteoporosis and fractures, ask your health care provider if you should:  Be screened for bone loss.  Take a calcium or vitamin D supplement to lower your risk of fractures.  Be given hormone replacement therapy (HRT) to treat symptoms of menopause. Follow these instructions at home: Lifestyle  Do not use any products that contain nicotine or tobacco, such as cigarettes, e-cigarettes, and chewing tobacco. If you need help quitting, ask your health care provider.  Do not use street drugs.  Do not share needles.  Ask your health care provider for help if you need support or information about quitting drugs. Alcohol use  Do not drink alcohol if: ? Your health care provider tells you not to drink. ? You are pregnant, may be pregnant, or are planning to become pregnant.  If you drink alcohol: ? Limit how  much you use to 0-1 drink a day. ? Limit intake if you are breastfeeding.  Be aware of how much alcohol is in your drink. In the [place] one drink equals one 12 oz bottle of beer (355 mL), one 5 oz glass of wine (148 mL), or one 1 oz glass of hard liquor (44 mL). General instructions  Schedule regular health, dental, and eye exams.  Stay current with your vaccines.  Tell your health care provider if: ? You often feel depressed. ? You have ever been abused or do not feel safe at home. Summary  Adopting a healthy lifestyle and getting preventive care are important in promoting health and wellness.  Follow your health care provider's instructions about healthy diet, exercising, and getting tested or screened for diseases.  Follow your health care provider's instructions on monitoring your cholesterol and blood pressure. This information is not intended to replace advice given to you by your health care provider. Make  sure you discuss any questions you have with your health care provider. Document Revised: 09/25/2018 Document Reviewed: 09/25/2018 Elsevier Patient Education  2020 [facility]'s number is [phone]. Please call with questions or concerns about what we discussed today.

## 2020-09-02 NOTE — Progress Notes (Addendum)
Subjective:   [name] is a 64 y.o. female who presents for Medicare Annual (Subsequent) preventive examination.  Review of Systems: Defer to PCP.  Cardiac Risk Factors include: hypertension  Objective:   Vitals: BP (!) 146/84   Pulse 71   Ht 5\' 2"  (1.575 m)   Wt 205 lb (93 kg)   SpO2 96%   BMI 37.49 kg/m   Body mass index is 37.49 kg/m.  Advanced Directives 09/02/2020 07/06/2020 06/03/2020 10/30/2019 08/13/2018 06/19/2018 05/23/2018  Does Patient Have a Medical Advance Directive? No No No No No No No  Does patient want to make changes to medical advance directive? Yes (MAU/Ambulatory/Procedural Areas - Information given) - - - - - -  Would patient like information on creating a medical advance directive? No - Patient declined No - Patient declined No - Patient declined No - Patient declined Yes (MAU/Ambulatory/Procedural Areas - Information given) No - Patient declined No - Patient declined   Tobacco Social History   Tobacco Use  Smoking Status Never Smoker  Smokeless Tobacco Never Used     Clinical Intake:  Pre-visit preparation completed: Yes  Pain Score: 0-No pain  How often do you need to have someone help you when you read instructions, pamphlets, or other written materials from your doctor or pharmacy?: 1 - Never What is the last grade level you completed in school?: College  Interpreter Needed?: No  Past Medical History:  Diagnosis Date  . Allergy   . Anxiety   . 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 Carpal tunnel syndrome, bilateral   . Chest pain   . Heart murmur    age 7 said had heart murmur.  . Hyperlipidemia   . Hypertension   . Normal cardiac stress test    Myoview stress test  . OAB (overactive bladder) 09/01/2015  . Osteopenia 05/2018   T score -1.1 FRAX 2.5%/ 0.1%   Past Surgical History:  Procedure Laterality Date  . COLONOSCOPY    . POLYPECTOMY    . 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 TOTAL HIP  ARTHROPLASTY Left 10/01/2013   Procedure: TOTAL HIP ARTHROPLASTY;  Surgeon: Kerin Salen, MD;  Location: [facility];  Service: Orthopedics;  Laterality: Left;   Family History  Problem Relation Age of Onset  . Hypertension Mother   . Diabetes Father   . Hyperlipidemia Brother   . Breast cancer Paternal Grandmother   . Heart attack Neg Hx   . Sudden death Neg Hx   . Colon cancer Neg Hx    Social History   Socioeconomic History  . Marital status: Divorced    Spouse name: Not on file  . Number of children: 3  . Years of education: Not on file  . Highest education level: Not on file  Occupational History  . 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 Smoking status: Never Smoker  . Smokeless tobacco: Never Used  Vaping Use  . Vaping Use: Never used  Substance and Sexual Activity  . Alcohol use: No    Alcohol/week: 0.0 standard drinks  . Drug use: No  . Sexual activity: Yes    Partners: Male    Birth control/protection: Post-menopausal  Other Topics Concern  . Not on file  Social History Narrative   Patient lives alone in  [place].    Patient has 3 children and has grandchildren.    Patient enjoys reading, spending time with her family, and very involved in her church.    Social Determinants of Health   Financial Resource Strain: Low Risk   . Difficulty of Paying Living Expenses: Not hard at all  Food Insecurity: No Food Insecurity  . Worried About Charity fundraiser in the Last Year: Never true  . Ran Out of Food in the Last Year: Never true  Transportation Needs: No Transportation Needs  . Lack of Transportation (Medical): No  . Lack of Transportation (Non-Medical): No  Physical Activity: Insufficiently Active  . Days of Exercise per Week: 3 days  . Minutes of Exercise per Session: 20 min  Stress: No Stress Concern Present  . Feeling of Stress : Only a  little  Social Connections: Moderately Integrated  . Frequency of Communication with Friends and Family: More than three times a week  . Frequency of Social Gatherings with Friends and Family: More than three times a week  . Attends Religious Services: More than 4 times per year  . Active Member of Clubs or Organizations: Yes  . Attends Archivist Meetings: More than 4 times per year  . Marital Status: Divorced   Outpatient Encounter Medications as of 09/02/2020  Medication Sig  . albuterol (PROVENTIL HFA) 108 (90 Base) MCG/ACT inhaler INHALE TWO PUFFS BY MOUTH INTO THE LUNGS EVERY 6 HOURS AS NEEDED FOR WHEEZING OR SHORTNESS OF BREATH  . amLODipine (NORVASC) 10 MG tablet Take 1 tablet (10 mg total) by mouth in the morning.  . capsaicin (ZOSTRIX) 0.025 % cream Apply topically 2 (two) times daily.  . cholecalciferol (VITAMIN D3) 25 MCG (1000 UNIT) tablet Take 1,000 Units by mouth daily.  . fluticasone (FLONASE) 50 MCG/ACT nasal spray USE ONE SPRAY(S) IN EACH NOSTRIL TWICE DAILY (Patient taking differently: Place 1 spray into both nostrils as needed. )  . fluticasone (FLONASE) 50 MCG/ACT nasal spray Place 1 spray into both nostrils as needed for allergies or rhinitis.  Marland Kitchen gabapentin (NEURONTIN) 100 MG capsule Take 1 capsule (100 mg total) by mouth daily.  Marland Kitchen loratadine (EQ ALLERGY RELIEF) 10 MG tablet Take 1 tablet (10 mg total) by mouth daily.  . metoprolol tartrate (LOPRESSOR) 50 MG tablet Take 1 tablet (50 mg total) by mouth 2 (two) times daily.  . rosuvastatin (CRESTOR) 5 MG tablet TAKE ONE TABLET BY MOUTH EVERY MONDAY AND FRIDAY.   No facility-administered encounter medications on file as of 09/02/2020.   Activities of Daily Living In your present state of health, do you have any difficulty performing the following activities: 09/02/2020  Hearing? N  Vision? N  Difficulty concentrating or making decisions? Y  Walking or climbing stairs? N  Dressing or bathing? N  Doing  errands, shopping? N  Preparing Food and eating ? N  Using the Toilet? N  In the past six months, have you accidently leaked urine? Y  Comment had a procedure done- no longer issues now  Do you have problems with loss of bowel control? N  Managing your Medications? N  Managing your Finances? N  Housekeeping or managing your Housekeeping? N  Some recent data might be hidden   Patient Care Team: Sharion Settler, DO as PCP - General (Family Medicine) Lorretta Harp, MD as PCP - Cardiology (Cardiology) Kathrynn Ducking, MD as Consulting Physician (Neurology)    Assessment:   This is a routine wellness examination for  [name].  Exercise Activities and Dietary recommendations Current Exercise Habits: Home exercise routine, Type of exercise: walking, Time (Minutes): 20, Frequency (Times/Week): 3, Weekly Exercise (Minutes/Week): 60  Goals    . Blood Pressure < 140/90    . DIET - INCREASE WATER INTAKE      Fall Risk Fall Risk  09/02/2020 07/06/2020 06/03/2020 10/30/2019 08/01/2019  Falls in the past year? 0 0 0 0 1  Number falls in past yr: 0 0 0 0 0  Injury with Fall? - 0 0 - 0   Is the patient's home free of loose throw rugs in walkways, pet beds, electrical cords, etc?   yes      Grab bars in the bathroom? yes      Handrails on the stairs?   yes      Adequate lighting?   yes  Patient rating of health (0-10) scale: 9  Depression Screen PHQ 2/9 Scores 09/02/2020 07/06/2020 06/03/2020 10/30/2019  PHQ - 2 Score 2 2 2 2   PHQ- 9 Score 6 6 8 5     Cognitive Function   Immunization History  Administered Date(s) Administered  . Influenza Split 08/09/2012  . Influenza Whole 11/09/2008, 11/30/2009, 09/19/2010  . Influenza,inj,Quad PF,6+ Mos 08/21/2014, 07/16/2015, 08/20/2017, 08/02/2018, 07/02/2019, 07/06/2020  . PFIZER SARS-COV-2 Vaccination 04/28/2020, 05/19/2020  . PPD Test 08/09/2012, 08/13/2012  . Td 10/16/2000  . Tdap 09/20/2011   Screening Tests Health Maintenance    Topic Date Due  . PAP SMEAR-Modifier  05/28/2021  . TETANUS/TDAP  09/19/2021  . MAMMOGRAM  12/31/2021  . COLONOSCOPY  10/30/2022  . INFLUENZA VACCINE  Completed  . COVID-19 Vaccine  Completed  . Hepatitis C Screening  Completed  . HIV Screening  Completed   Cancer Screenings: Lung: Low Dose CT Chest recommended if Age 26-80 years, 30 pack-year currently smoking OR have quit w/in 15years. Patient does not qualify. Breast:  Up to date on Mammogram? Yes   Up to date of Bone Density/Dexa? Yes Colorectal: UTD  Additional Screenings:  Hepatitis C Screening: Completed HIV Screening: Completed   Plan:  Fill out the advance directive packet.  You will be due for your booster in April. Keep up the good work walking!  I have personally reviewed and noted the following in the patient's chart:   . Medical and social history . Use of alcohol, tobacco or illicit drugs  . Current medications and supplements . Functional ability and status . Nutritional status . Physical activity . Advanced directives . List of other physicians . Hospitalizations, surgeries, and ER visits in previous 12 months . Vitals . Screenings to include cognitive, depression, and falls . Referrals and appointments  In addition, I have reviewed and discussed with patient certain preventive protocols, quality metrics, and best practice recommendations. A written personalized care plan for preventive services as well as general preventive health recommendations were provided to patient.  Dorna Bloom, CMA  09/02/2020     I have reviewed this visit and agree with the documentation.  Sharion Settler PGY-1 Family Medicine

## 2020-09-11 ENCOUNTER — Other Ambulatory Visit: Payer: Self-pay | Admitting: Family Medicine

## 2020-09-14 ENCOUNTER — Telehealth: Payer: Self-pay | Admitting: Cardiovascular Disease

## 2020-09-14 ENCOUNTER — Telehealth: Payer: Self-pay

## 2020-09-14 ENCOUNTER — Ambulatory Visit: Payer: Medicare Other | Admitting: Family Medicine

## 2020-09-14 NOTE — Progress Notes (Deleted)
    SUBJECTIVE:   CHIEF COMPLAINT / HPI:   Headache/dizziness: Patient is a pleasant 64 year old female that presents today to discuss dizziness.  Patient also states that she has been having a headache for the past***.  PERTINENT  PMH / PSH: ***  OBJECTIVE:   There were no vitals taken for this visit.   General: NAD, pleasant, able to participate in exam Cardiac: RRR, no murmurs. Respiratory: CTAB, normal effort Neuro: alert, no obvious focal deficits Psych: Normal affect and mood  ASSESSMENT/PLAN:   No problem-specific Assessment & Plan notes found for this encounter.     Lurline Del, [place]    This note was prepared using Dragon voice recognition software and may include unintentional dictation errors due to the inherent limitations of voice recognition software.

## 2020-09-14 NOTE — Telephone Encounter (Signed)
I am helping in triage today. Called to discuss below symptoms but patient did not answer. Left message on voicemail  to call back to discuss concerns. Also recommended that if patient is having a severe headache with associated dizziness, vision changes, slurred speech etc, she should be seen in the ED.  Darreld Mclean, PA-C 09/14/2020 10:40 AM

## 2020-09-14 NOTE — Progress Notes (Signed)
    SUBJECTIVE:   CHIEF COMPLAINT / HPI:   Stomach Pain: Patient is a pleasant 64 year old female that presents today to discuss stomach pain. Has been going on for a few months, comes and goes. She thinks it is related to her diet. Pain is generalized in her mid-abdomen. She has tried some digestive enzymes and is having regular bowel movements. She states that this has helped. She endorses some bloating when her stomach pain occurs. Mostly happens at night after her last meal of the day. She last had some abdominal discomfort after eating spicy shrimp last night. Denies fevers or diarrhea. She had a colonoscopy back in January.   PERTINENT  PMH / PSH: Recent colonoscopy in January  OBJECTIVE:   BP 128/74   Pulse 68   Ht 5\' 2"  (1.575 m)   Wt 206 lb (93.4 kg)   SpO2 98%   BMI 37.68 kg/m    General: NAD, pleasant, able to participate in exam Cardiac: RRR, no murmurs. Respiratory: CTAB, normal effort Abdomen: Bowel sounds present, no abdominal discomfort to palpation, nondistended. Negative Murphy sign Neuro: alert, no obvious focal deficits Psych: Normal affect and mood  ASSESSMENT/PLAN:   Abdominal pain Assessment: 64 year old female following up for abdominal pain. During her last appointment she was started on digestive enzymes and had a goal of trying to have a bowel movement daily to improve her symptoms. Patient states this has improved her symptoms but she had some abdominal pain in the evenings after eating certain foods. She states that her most recent 1 involved eating spicy shrimp. Denies any fevers or diarrhea. Patient had a recent colonoscopy in January. No abdominal pain on palpation today. With no diarrhea, no fevers, and a recent colonoscopy as well as a benign abdominal exam today I feel that her symptoms are most likely due to either diet or stress level. Patient does endorse that she has been under some stress recently. PHQ-9 score of 5 today. Plan: -Recommended  dietary changes trying to avoid spicy foods and try to minimize high-fat foods especially later in the evening as this is when most of the patient's symptoms occur -Recommended continue with good hydration and exercise -Recommended continuing to have a soft bowel movement daily as constipation can also lead to increased incidences of abdominal pain.     Lurline Del, [place]    This note was prepared using Dragon voice recognition software and may include unintentional dictation errors due to the inherent limitations of voice recognition software.

## 2020-09-14 NOTE — Telephone Encounter (Signed)
Patient calls nurse line reporting headache and dizziness over the last "few months." Patient reports she attributes this to changes in her blood pressure medications by her Cardiologist. Patient advised to contact them, however patient reports she wants to be seen today and she has been playing phone tag with them. Patient denies SOB, blurred vision, chest pain, or slurred speech. I scheduled patient for 1:30pm, however advised her to continue to contact her Cardiologist.

## 2020-09-14 NOTE — Telephone Encounter (Signed)
    STAT if patient feels like he/she is going to faint   1) Are you dizzy now? No  2) Do you feel faint or have you passed out? no  3) Do you have any other symptoms? Severe headache  4) Have you checked your HR and BP (record if available)? No   Pt said she's been feeling dizzy and severe headache. She said it comes and go for 2 months now, she didn't get a chance to check BP or HR but she is getting concerned and wanted to check in with Dr. Gwenlyn Found

## 2020-09-15 ENCOUNTER — Ambulatory Visit (INDEPENDENT_AMBULATORY_CARE_PROVIDER_SITE_OTHER): Payer: Medicare Other | Admitting: Family Medicine

## 2020-09-15 ENCOUNTER — Other Ambulatory Visit: Payer: Self-pay

## 2020-09-15 DIAGNOSIS — R1084 Generalized abdominal pain: Secondary | ICD-10-CM | POA: Diagnosis not present

## 2020-09-15 NOTE — Assessment & Plan Note (Signed)
Assessment: 64 year old female following up for abdominal pain. During her last appointment she was started on digestive enzymes and had a goal of trying to have a bowel movement daily to improve her symptoms. Patient states this has improved her symptoms but she had some abdominal pain in the evenings after eating certain foods. She states that her most recent 1 involved eating spicy shrimp. Denies any fevers or diarrhea. Patient had a recent colonoscopy in January. No abdominal pain on palpation today. With no diarrhea, no fevers, and a recent colonoscopy as well as a benign abdominal exam today I feel that her symptoms are most likely due to either diet or stress level. Patient does endorse that she has been under some stress recently. PHQ-9 score of 5 today. Plan: -Recommended dietary changes trying to avoid spicy foods and try to minimize high-fat foods especially later in the evening as this is when most of the patient's symptoms occur -Recommended continue with good hydration and exercise -Recommended continuing to have a soft bowel movement daily as constipation can also lead to increased incidences of abdominal pain.

## 2020-09-15 NOTE — Patient Instructions (Signed)
It was great to see you! Thank you for allowing me to participate in your care!  Our plans for today:  -I want you to try to minimize high-fat foods especially in your later meals of the day. Try to shift to eating fresh vegetables, lean meats, and some carbohydrates such as rice. Try to avoid overly spicy foods as this can also cause some abdominal bloating and abdominal upset. -Continue to work on exercise and hydration as these can also factor in and can help with your abdominal discomfort. -If you do not notice a difference in this over the next 4 weeks please make a follow-up appointment because this gives [place] more information the dietary changes did not improve your symptoms, however I think that they will.  Take care and seek immediate care sooner if you develop any concerns.   Dr. Lurline Del, DO [facility]   Healthy Eating Following a healthy eating pattern may help you to achieve and maintain a healthy body weight, reduce the risk of chronic disease, and live a long and productive life. It is important to follow a healthy eating pattern at an appropriate calorie level for your body. Your nutritional needs should be met primarily through food by choosing a variety of nutrient-rich foods. What are tips for following this plan? Reading food labels  Read labels and choose the following: ? Reduced or low sodium. ? Juices with 100% fruit juice. ? Foods with low saturated fats and high polyunsaturated and monounsaturated fats. ? Foods with whole grains, such as whole wheat, cracked wheat, brown rice, and wild rice. ? Whole grains that are fortified with folic acid. This is recommended for women who are pregnant or who want to become pregnant.  Read labels and avoid the following: ? Foods with a lot of added sugars. These include foods that contain brown sugar, corn sweetener, corn syrup, dextrose, fructose, glucose, high-fructose corn syrup, honey, invert sugar, lactose, malt  syrup, maltose, molasses, raw sugar, sucrose, trehalose, or turbinado sugar.  Do not eat more than the following amounts of added sugar per day:  6 teaspoons (25 g) for women.  9 teaspoons (38 g) for men. ? Foods that contain processed or refined starches and grains. ? Refined grain products, such as white flour, degermed cornmeal, white bread, and white rice. Shopping  Choose nutrient-rich snacks, such as vegetables, whole fruits, and nuts. Avoid high-calorie and high-sugar snacks, such as potato chips, fruit snacks, and candy.  Use oil-based dressings and spreads on foods instead of solid fats such as butter, stick margarine, or cream cheese.  Limit pre-made sauces, mixes, and "instant" products such as flavored rice, instant noodles, and ready-made pasta.  Try more plant-protein sources, such as tofu, tempeh, black beans, edamame, lentils, nuts, and seeds.  Explore eating plans such as the Mediterranean diet or vegetarian diet. Cooking  Use oil to saut or stir-fry foods instead of solid fats such as butter, stick margarine, or lard.  Try baking, boiling, grilling, or broiling instead of frying.  Remove the fatty part of meats before cooking.  Steam vegetables in water or broth. Meal planning   At meals, imagine dividing your plate into fourths: ? One-half of your plate is fruits and vegetables. ? One-fourth of your plate is whole grains. ? One-fourth of your plate is protein, especially lean meats, poultry, eggs, tofu, beans, or nuts.  Include low-fat dairy as part of your daily diet. Lifestyle  Choose healthy options in all settings, including home, work,  school, restaurants, or stores.  Prepare your food safely: ? Wash your hands after handling raw meats. ? Keep food preparation surfaces clean by regularly washing with hot, soapy water. ? Keep raw meats separate from ready-to-eat foods, such as fruits and vegetables. ? Cook seafood, meat, poultry, and eggs to the  recommended internal temperature. ? Store foods at safe temperatures. In general:  Keep cold foods at 53F (4.4C) or below.  Keep hot foods at 153F (60C) or above.  Keep your freezer at [facility] (-17.8C) or below.  Foods are no longer safe to eat when they have been between the temperatures of 40-153F (4.4-60C) for more than 2 hours. What foods should I eat? Fruits Aim to eat 2 cup-equivalents of fresh, canned (in natural juice), or frozen fruits each day. Examples of 1 cup-equivalent of fruit include 1 small apple, 8 large strawberries, 1 cup canned fruit,  cup dried fruit, or 1 cup 100% juice. Vegetables Aim to eat 2-3 cup-equivalents of fresh and frozen vegetables each day, including different varieties and colors. Examples of 1 cup-equivalent of vegetables include 2 medium carrots, 2 cups raw, leafy greens, 1 cup chopped vegetable (raw or cooked), or 1 medium baked potato. Grains Aim to eat 6 ounce-equivalents of whole grains each day. Examples of 1 ounce-equivalent of grains include 1 slice of bread, 1 cup ready-to-eat cereal, 3 cups popcorn, or  cup cooked rice, pasta, or cereal. Meats and other proteins Aim to eat 5-6 ounce-equivalents of protein each day. Examples of 1 ounce-equivalent of protein include 1 egg, 1/2 cup nuts or seeds, or 1 tablespoon (16 g) peanut butter. A cut of meat or fish that is the size of a deck of cards is about 3-4 ounce-equivalents.  Of the protein you eat each week, try to have at least 8 ounces come from seafood. This includes salmon, trout, herring, and anchovies. Dairy Aim to eat 3 cup-equivalents of fat-free or low-fat dairy each day. Examples of 1 cup-equivalent of dairy include 1 cup (240 mL) milk, 8 ounces (250 g) yogurt, 1 ounces (44 g) natural cheese, or 1 cup (240 mL) fortified soy milk. Fats and oils  Aim for about 5 teaspoons (21 g) per day. Choose monounsaturated fats, such as canola and olive oils, avocados, peanut butter, and most  nuts, or polyunsaturated fats, such as sunflower, corn, and soybean oils, walnuts, pine nuts, sesame seeds, sunflower seeds, and flaxseed. Beverages  Aim for six 8-oz glasses of water per day. Limit coffee to three to five 8-oz cups per day.  Limit caffeinated beverages that have added calories, such as soda and energy drinks.  Limit alcohol intake to no more than 1 drink a day for nonpregnant women and 2 drinks a day for men. One drink equals 12 oz of beer (355 mL), 5 oz of wine (148 mL), or 1 oz of hard liquor (44 mL). Seasoning and other foods  Avoid adding excess amounts of salt to your foods. Try flavoring foods with herbs and spices instead of salt.  Avoid adding sugar to foods.  Try using oil-based dressings, sauces, and spreads instead of solid fats. This information is based on general [place] nutrition guidelines. For more information, visit Build[employer].es. Exact amounts may vary based on your nutrition needs. Summary  A healthy eating plan may help you to maintain a healthy weight, reduce the risk of chronic diseases, and stay active throughout your life.  Plan your meals. Make sure you eat the right portions of a variety of  nutrient-rich foods.  Try baking, boiling, grilling, or broiling instead of frying.  Choose healthy options in all settings, including home, work, school, restaurants, or stores. This information is not intended to replace advice given to you by your health care provider. Make sure you discuss any questions you have with your health care provider. Document Revised: 01/14/2018 Document Reviewed: 01/14/2018 Elsevier Patient Education  [place].

## 2020-09-17 ENCOUNTER — Encounter: Payer: Self-pay | Admitting: Family Medicine

## 2020-09-17 ENCOUNTER — Ambulatory Visit (INDEPENDENT_AMBULATORY_CARE_PROVIDER_SITE_OTHER): Payer: Medicare Other | Admitting: Family Medicine

## 2020-09-17 ENCOUNTER — Other Ambulatory Visit: Payer: Self-pay

## 2020-09-17 VITALS — BP 132/88 | HR 85 | Wt 210.8 lb

## 2020-09-17 DIAGNOSIS — R413 Other amnesia: Secondary | ICD-10-CM

## 2020-09-17 NOTE — Progress Notes (Signed)
    SUBJECTIVE:   CHIEF COMPLAINT / HPI:   Ms Kachel is a 64 yo who presents for neuro referral. States her family and friends say she is acting different- sister on phone states she forgets a lot, can tell her something and 5 minutes later she forgets. Of note, patient had a fall about a year ago and they feel the memory loss began at that time and has recently worsened. Patient also called her friend Chrys Racer while in the office. Chrys Racer states she has been having difficulty with directions while in the car even though she grew up in the area and she seems to be more confused.  Also states she forgets conversations easily. States she is less sharp. Does endorse patient has a lot on her plate and increased stress. Had recent deaths in the family and declining health of family members could be contributing. Has been able to attend therapy and grief counseling which has helped  Denies any recent falls since last year, syncope, no vision changes, does occasionally get dizzy. Does have chronic headaches.    10/9/2 head CT: Negative for age non contrast appearance of the brain. No acute traumatic brain injury  PERTINENT  PMH / PSH:  Neuropathic pain, headaches, memory loss   OBJECTIVE:   BP 132/88   Pulse 85   Wt 210 lb 12.8 oz (95.6 kg)   SpO2 98%   BMI 38.56 kg/m    General: well appearing Respiratory: CTAB. Normal WOB GI: abdomen soft, non-distended, non-tender Skin: warm, dry. Well perfused  Neuro: AOx4. CN 2-12 in tact. No focal defecits  ASSESSMENT/PLAN:   No problem-specific Assessment & Plan notes found for this encounter.   Memory Loss  Referral to Neurology was previously placed and it looks like she has seen the Neurologist once before. Gave patient number to call to make appt and to let me know if a referral needs to be resubmitted  - f/u with Neuro per patient's request - Can consider doing cognitive test at next visit (did not have time this visit)   Shary Key, [place]

## 2020-09-17 NOTE — Patient Instructions (Signed)
It was great seeing you today! You were seen for concerns of memory loss and not feeling how you used to. You are requesting a referral to neurology and it looks like you have been seen by the office of Dr. Margette Fast. Please call their office to schedule an appointment: [phone]. If they need another referral we would be happy to submit one, just let us know.  Feel free to call with any questions or concerns at any time, at [phone].   Take care,  Dr. Shary Key [facility]

## 2020-09-20 NOTE — Telephone Encounter (Signed)
Patient had appointment with PCP on 12/1

## 2020-10-04 ENCOUNTER — Ambulatory Visit: Payer: Medicare Other | Admitting: Neurology

## 2020-10-12 ENCOUNTER — Encounter: Payer: Self-pay | Admitting: Neurology

## 2020-10-12 ENCOUNTER — Ambulatory Visit: Payer: Medicare Other | Admitting: Neurology

## 2020-10-12 ENCOUNTER — Telehealth: Payer: Self-pay | Admitting: Neurology

## 2020-10-12 VITALS — BP 122/72 | HR 70 | Ht 62.0 in | Wt 207.0 lb

## 2020-10-12 DIAGNOSIS — R413 Other amnesia: Secondary | ICD-10-CM

## 2020-10-12 DIAGNOSIS — Z87898 Personal history of other specified conditions: Secondary | ICD-10-CM

## 2020-10-12 NOTE — Progress Notes (Signed)
PATIENT: [name] DOB: 04-Oct-1956  REASON FOR VISIT: follow up HISTORY FROM: patient  HISTORY OF PRESENT ILLNESS: Today 10/12/20 [name] is a 64 year old female with history of syncopal event in October 2020, another brief event in April 2021, general reported feeling spacey at times.  Cardiac work-up has been unrevealing.  EEG in July 2021 was normal.  MRI of the brain has been ordered but has yet to be completed. Here today, no complaints.  Reviewed the previous history, saw her PCP few weeks ago, family expressed concerns that she was not acting right. At times has short-term memory loss, trouble recalling.  There was a time she was driving, had a friend with her, could not remember where she was going, but was able to talk it out with herself and figure it out.  This often happens, she talks things out when she can't remember (did this today with the date, remembering when Christmas was and counting forward).  She mostly lives alone, but her son is in and out.  Reportedly manages her household well, handles her own affairs.  She thinks her trouble with recall is normal, part of aging.  Thinks her children are overreacting.  Admits to some anxiety, worries about her children.  No falls.  No further blackout spells. At times just feels confused but can reason her way to understanding.  Here today for follow-up unaccompanied.  HISTORY 04/09/2020 Dr. Jannifer Franklin: [name] is a 64 year old right-handed black female with a history of a syncopal event that occurred around 25 July 2019.  The patient was having a conversation with someone and then suddenly blacked out without any warning, falling into a door and then down to the ground.  She was unconscious only for few seconds and then when she came to she continued her conversation as if nothing happened.  The patient has not had any recurrence of this event before or after.  She did have a brief event in April 2021 where she felt zoned out and  had to reorient herself.  She feels spacey at times, this has been an issue that has been going on for greater than a year.  She reports no definite memory problems, she denies problems with getting lost with driving, she is able to manage her medications and appointments and do her finances without difficulty.  She denies any fatigue or drowsiness during the day, she sleeps well at night.  She has no headaches, she denies any dizziness, palpitations of the heart, or any numbness or weakness of the face, arms, legs.  The patient denies any balance issues or difficulty controlling the bowels or the bladder.  She has undergone a cardiac work-up that did not show any cardiac source of the blackout.  She is referred to this office for further evaluation.   REVIEW OF SYSTEMS: Out of a complete 14 system review of symptoms, the patient complains only of the following symptoms, and all other reviewed systems are negative.  Memory loss  ALLERGIES: Allergies  Allergen Reactions   Hctz [Hydrochlorothiazide] Other (See Comments)    Stopped after syncopal spell- unclear if it was related   Hydrocodone     Mood disturbance   Simvastatin     REACTION: muscle aches, GI complaints    HOME MEDICATIONS: Outpatient Medications Prior to Visit  Medication Sig Dispense Refill   albuterol (PROVENTIL HFA) 108 (90 Base) MCG/ACT inhaler INHALE TWO PUFFS BY MOUTH INTO THE LUNGS EVERY 6 HOURS AS NEEDED FOR  WHEEZING OR SHORTNESS OF BREATH 18 g 2   amLODipine (NORVASC) 10 MG tablet Take 1 tablet (10 mg total) by mouth in the morning.     capsaicin (ZOSTRIX) 0.025 % cream Apply topically 2 (two) times daily. 60 g 0   cholecalciferol (VITAMIN D3) 25 MCG (1000 UNIT) tablet Take 1,000 Units by mouth daily.     fluticasone (FLONASE) 50 MCG/ACT nasal spray USE ONE SPRAY(S) IN EACH NOSTRIL TWICE DAILY (Patient taking differently: Place 1 spray into both nostrils as needed.) 16 g 6   fluticasone (FLONASE) 50 MCG/ACT  nasal spray Place 1 spray into both nostrils as needed for allergies or rhinitis. 1 g 3   gabapentin (NEURONTIN) 100 MG capsule Take 1 capsule by mouth once daily 30 capsule 0   loratadine (EQ ALLERGY RELIEF) 10 MG tablet Take 1 tablet (10 mg total) by mouth daily. 90 tablet 3   metoprolol tartrate (LOPRESSOR) 50 MG tablet Take 1 tablet (50 mg total) by mouth 2 (two) times daily. 180 tablet 3   rosuvastatin (CRESTOR) 5 MG tablet TAKE ONE TABLET BY MOUTH EVERY MONDAY AND FRIDAY. 10 tablet 0   No facility-administered medications prior to visit.    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   DR Mayer Camel   TOTAL HIP ARTHROPLASTY Left 10/01/2013   Procedure: TOTAL HIP ARTHROPLASTY;  Surgeon: Kerin Salen, MD;  Location: [facility];  Service: Orthopedics;  Laterality: Left;    FAMILY HISTORY: Family History  Problem Relation Age of Onset   Hypertension Mother    Diabetes Father    Hyperlipidemia Brother    Breast cancer Paternal Grandmother    Heart attack Neg Hx    Sudden death Neg Hx    Colon cancer Neg Hx     SOCIAL HISTORY: Social History   Socioeconomic History   Marital status: Divorced    Spouse name: Not on file   Number of children: 3   Years of education: Not on file   Highest education level: Not on file  Occupational History   Occupation: Heritage manager: [employer]    Comment: 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    Patient has 3 children and has grandchildren.    Patient enjoys reading, spending time with her family, and very  involved in her church.    Social Determinants of Health   Financial Resource Strain: Low Risk    Difficulty of Paying Living Expenses: Not hard at all  Food Insecurity: No Food Insecurity   Worried About Charity fundraiser in the Last Year: Never true   Will in the Last Year: Never true  Transportation Needs: No Transportation Needs   Lack of Transportation (Medical): No   Lack of Transportation (Non-Medical): No  Physical Activity: Insufficiently Active   Days of Exercise per Week: 3 days   Minutes of Exercise per Session: 20 min  Stress: No Stress Concern Present   Feeling of Stress : Only a little  Social Connections: Moderately Integrated   Frequency of Communication with Friends and Family: More than three times a week   Frequency of Social Gatherings with Friends and Family: More than three times a week   Attends Religious Services: More than 4 times per year   Active Member of [employer] or Organizations: Yes   Attends Music therapist: More than 4 times per year   Marital Status: Divorced  Human resources officer Violence: Not At Risk   Fear of Current or Ex-Partner: No   Emotionally Abused: No   Physically Abused: No   Sexually Abused: No   PHYSICAL EXAM  Vitals:   10/12/20 0743  BP: 122/72  Pulse: 70  Weight: 207 lb (93.9 kg)  Height: 5\' 2"  (1.575 m)   Body mass  index is 37.86 kg/m.  Generalized: Well developed, in no acute distress  MMSE - Mini Mental State Exam 10/12/2020  Orientation to time 5  Orientation to Place 5  Registration 3  Attention/ Calculation 2  Recall 3  Language- name 2 objects 2  Language- repeat 1  Language- follow 3 step command 3  Language- read & follow direction 1  Write a sentence 1  Copy design 1  Total score 27    Neurological examination  Mentation: Alert oriented to time, place, history taking. Follows all commands speech and language fluent Cranial nerve II-XII: Pupils were equal round reactive to light. Extraocular movements were full, visual field were full on confrontational test. Facial sensation and strength were normal. Head turning and shoulder shrug  were normal and symmetric. Motor: The motor testing reveals 5 over 5 strength of all 4 extremities. Good symmetric motor tone is noted throughout.  Sensory: Sensory testing is intact to soft touch on all 4 extremities. No evidence of extinction is noted.  Coordination: Cerebellar testing reveals good finger-nose-finger and heel-to-shin bilaterally.  Gait and station: Gait is normal. Tandem gait is slightly unsteady. Romberg is negative. No drift is seen.  Reflexes: Deep tendon reflexes are symmetric and normal bilaterally.   DIAGNOSTIC DATA (LABS, IMAGING, TESTING) - I reviewed patient records, labs, notes, testing and imaging myself where available.  Lab Results  Component Value Date   WBC 8.2 10/30/2019   HGB 12.5 10/30/2019   HCT 38.9 10/30/2019   MCV 85 10/30/2019   PLT 304 10/30/2019      Component Value Date/Time   NA 143 06/03/2020 1234   K 4.1 06/03/2020 1234   CL 108 (H) 06/03/2020 1234   CO2 24 06/03/2020 1234   GLUCOSE 75 06/03/2020 1234   GLUCOSE 86 05/03/2015 1559   BUN 7 (L) 06/03/2020 1234   CREATININE 0.62 06/03/2020 1234   CREATININE 0.75 05/03/2015 1559   CALCIUM 10.4 (H) 06/03/2020 1234   PROT 7.1 10/30/2019 1453  ALBUMIN 4.5 10/30/2019 1453   AST 13 10/30/2019 1453   ALT 13 10/30/2019 1453   ALKPHOS 147 (H) 10/30/2019 1453   BILITOT 0.2 10/30/2019 1453   GFRNONAA 96 06/03/2020 1234   GFRNONAA >89 01/15/2014 1509   GFRAA 111 06/03/2020 1234   GFRAA >89 01/15/2014 1509   Lab Results  Component Value Date   CHOL 228 (H) 09/08/2019   HDL 62 09/08/2019   LDLCALC 147 (H) 09/08/2019   LDLDIRECT 129 (H) 04/06/2009   TRIG 109 09/08/2019   CHOLHDL 3.7 09/08/2019   Lab Results  Component Value Date   HGBA1C 5.2 05/03/2015   No results found for: [id number] Lab Results  Component Value Date   TSH 0.381 (L) 06/03/2020   ASSESSMENT AND PLAN 64 y.o. year old female  has a past medical history of Allergy, Anxiety, Arthritis, Carpal tunnel syndrome, bilateral, Chest pain, Heart murmur, Hyperlipidemia, Hypertension, Normal cardiac stress test, OAB (overactive bladder) (09/01/2015), and Osteopenia (05/2018). here with:  1.  Episode of syncope 2.  Reports of confusion, memory loss -DD includes seizure vs memory loss vs anxiety? -Will get set up for MRI of the brain with and without contrast -EEG was normal, MMSE today was 27/30 -Check B12, RPR, sed rate for evaluation of memory loss -See her back in 3 months or sooner if needed, let me know if any further syncopal events occurred (none since October 2020) -Events of memory loss, feeling spacey seems to be since October 2020 -Was here alone today, so no collateral information was obtained  I spent 30 minutes of face-to-face and non-face-to-face time with patient.  This included previsit chart review, lab review, study review, order entry, electronic health record documentation, patient education.  Margie Ege, AGNP-C, DNP 10/12/2020, 8:13 AM [facility] [address] [place], [place] [address] [phone]

## 2020-10-12 NOTE — Patient Instructions (Signed)
Will get you set up for MRI of the brain  Check labs today  Let me know of any syncope spells  See you back in 3-4 months

## 2020-10-12 NOTE — Telephone Encounter (Signed)
Can you please check on MRI of the brain, ordered by Dr. Anne Hahn, need a new order? We still need to get this. Thanks

## 2020-10-13 ENCOUNTER — Encounter: Payer: Self-pay | Admitting: *Deleted

## 2020-10-13 LAB — VITAMIN B12: Vitamin B-12: 230 pg/mL — ABNORMAL LOW (ref 232–1245)

## 2020-10-13 LAB — SEDIMENTATION RATE: Sed Rate: 15 mm/hr (ref 0–40)

## 2020-10-13 LAB — RPR: RPR Ser Ql: NONREACTIVE

## 2020-10-13 LAB — CREATININE, SERUM
Creatinine, Ser: 0.77 mg/dL (ref 0.57–1.00)
GFR calc Af Amer: 95 mL/min/{1.73_m2} (ref >59)
GFR calc non Af Amer: 82 mL/min/{1.73_m2} (ref >59)

## 2020-10-13 NOTE — Progress Notes (Signed)
I have read the note, and I agree with the clinical assessment and plan.  Develle Sievers K Ardenia Stiner   

## 2020-10-14 ENCOUNTER — Other Ambulatory Visit: Payer: Self-pay | Admitting: Family Medicine

## 2020-10-14 DIAGNOSIS — M792 Neuralgia and neuritis, unspecified: Secondary | ICD-10-CM

## 2020-10-16 NOTE — Telephone Encounter (Signed)
Noted, [facility] has tried to reach out to the patient to schedule  04/20/20 2nd attempt-lmom on home and cell#/clc 04/12/20 1st attempt-lmom on home and cell#//epic order/clce.   No new order is needed. I will send it back to GI. They will reach out to the patient to schedule.

## 2020-10-19 NOTE — Progress Notes (Signed)
Virtual Visit via Telephone Note   This visit type was conducted due to national recommendations for restrictions regarding the COVID-19 Pandemic (e.g. social distancing) in an effort to limit this patient's exposure and mitigate transmission in our community.  Due to her co-morbid illnesses, this patient is at least at moderate risk for complications without adequate follow up.  This format is felt to be most appropriate for this patient at this time.  The patient did not have access to video technology/had technical difficulties with video requiring transitioning to audio format only (telephone).  All issues noted in this document were discussed and addressed.  No physical exam could be performed with this format.  Please refer to the patient's chart for her  consent to telehealth for [facility]. Virtual platform was offered given ongoing worsening Covid-19 pandemic.  Date:  10/25/2020   ID:  [name], DOB May 12, 1956, MRN [id number]  Patient Location: Home Provider Location: [facility]  PCP:  Sharion Settler, DO  Cardiologist:  Quay Burow, MD  Electrophysiologist:  None    Evaluation Performed:  Follow-Up Visit  Chief Complaint: follow-up for syncope  History of Present Illness:    Beverly MALLICOAT is a 65 y.o. female with a history of recurrent syncope no arrhythmias noted on Monitor, hypertension, and hyperlipidemia who is followed by Dr. Gwenlyn Found and presents today for routine follow-up.  Patient was initially seen by Dr. Gwenlyn Found in 2016 for evaluation of atypical chest pain. ETT was ordered and was positive with horizontal ST depression in inferior leads and leads V5-V6. Therefore, exercise Myoview was ordered for further evaluation and showed no evidence of ischemia. Her symptoms subsequently resolved.   She was not seen by Dr. Gwenlyn Found again until 08/2019 when she was seen for further evaluation of syncope with subsequent fall. Echo and 30-Day Event Monitor were  ordered for further evaluation. Echo showed LVEF of 50-55% with no significant valvular disease. Monitor showed sinus rhythm/sinus bradycardia/sinus tachycardia with occasional PVCs but no concerning arrhythmias. Of note, while wearing the monitor patient had another syncopal episode. When asked about this event at follow-up visit, patient could not remember any of the detail. At some point, she was told to stop HCTZ thinking she may of had an orthostatic event. At follow-up visit in 02/2020, she denied any recurrent syncope but did described vague events where she appeared to lose concentration and forgot where she was. She was referred to Neurology and seen by Dr. Jannifer Franklin. EEG and MRI were ordered. EEG was normal. Unfortunately, MRI has not been performed yet.  Patient was last seen by Kerin Ransom, PA-C, in 05/2020 at which time she reported occasional palpitations but no sustained tachycardia and no any additional syncopal episodes. She was noted to have a flat affect. Her daughter expressed concern about her mother's "short term memory issues." She was advised to follow-up with Neurology. She did see Neurology on 10/12/2020 and they will again try to set up brain MRI.  Patient presents today for follow-up via telephone visit. She reports doing well since last visit. No recurrent syncope. She notes some palpitations (feeling of heart racing) that she thinks is due to coffee. She has been trying to cut back and only drink 1/2 cup per day which she thinks is helping some. No lightheadedness, dizziness, or presyncope. No chest pain or shortness of breath. No abnormal bleeding in urine or stools. She thinks her memory issues are stable.   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   Hyperlipidemia    Hypertension    Normal cardiac  stress test    Myoview stress test   OAB (overactive bladder) 09/01/2015   Osteopenia 05/2018   T score -1.1 FRAX 2.5%/ 0.1%   Past Surgical History:  Procedure Laterality Date   COLONOSCOPY     POLYPECTOMY     TOTAL HIP ARTHROPLASTY Left 10/01/2013   DR Turner Daniels   TOTAL HIP ARTHROPLASTY Left 10/01/2013   Procedure: TOTAL HIP ARTHROPLASTY;  Surgeon: [name], MD;  Location: [facility] OR;  Service: Orthopedics;  Laterality: Left;     Current Meds  Medication Sig   albuterol (PROVENTIL HFA) 108 (90 Base) MCG/ACT inhaler INHALE TWO PUFFS BY MOUTH INTO THE LUNGS EVERY 6 HOURS AS NEEDED FOR WHEEZING OR SHORTNESS OF BREATH   amLODipine (NORVASC) 10 MG tablet Take 1 tablet (10 mg total) by mouth in the morning.   capsaicin (ZOSTRIX) 0.025 % cream Apply topically 2 (two) times daily.   cholecalciferol (VITAMIN D3) 25 MCG (1000 UNIT) tablet Take 1,000 Units by mouth daily.   fluticasone (FLONASE) 50 MCG/ACT nasal spray USE ONE SPRAY(S) IN EACH NOSTRIL TWICE DAILY (Patient taking differently: Place 1 spray into both nostrils as needed.)   fluticasone (FLONASE) 50 MCG/ACT nasal spray Place 1 spray into both nostrils as needed for allergies or rhinitis.   gabapentin (NEURONTIN) 100 MG capsule Take 1 capsule by mouth once daily   loratadine (EQ ALLERGY RELIEF) 10 MG tablet Take 1 tablet (10 mg total) by mouth daily.   metoprolol tartrate (LOPRESSOR) 50 MG tablet Take 1 tablet (50 mg total) by mouth 2 (two) times daily.   rosuvastatin (CRESTOR) 5 MG tablet TAKE ONE TABLET BY MOUTH EVERY MONDAY AND FRIDAY.     Allergies:   Hctz [hydrochlorothiazide], Hydrocodone, and Simvastatin   Social History   Tobacco Use   Smoking status: Never Smoker   Smokeless tobacco: Never Used  Vaping Use   Vaping Use: Never used  Substance Use Topics   Alcohol use: No    Alcohol/week: 0.0 standard drinks   Drug use: No     Family Hx: The patient's family history includes Breast cancer in her  paternal grandmother; Diabetes in her father; Hyperlipidemia in her brother; Hypertension in her mother. There is no history of Heart attack, Sudden death, or Colon cancer.  ROS:   Please see the history of present illness.    All other systems reviewed and are negative.   Prior CV studies:    The following studies were reviewed today:   Exercise Myoview 07/30/2015:  The left ventricular ejection fraction is normal (55-65%).  Nuclear stress EF: 57%.  ST segment depression was noted during stress in the II, III and aVF leads.  The study is normal.   Normal stress nuclear study with no ischemia or infarction; EF 57 with normal wall motion.  _______________  Echocardiogram 09/19/2019: Impressions: 1. Left ventricular ejection fraction, by visual estimation, is 55 to  60%. The left ventricle has normal function. There is mildly increased  left ventricular hypertrophy.  2. The average left ventricular global longitudinal strain is -18.0 %.  3. Global right ventricle has normal systolic function.The right  ventricular size is normal. No increase in right ventricular  wall  thickness.  4. Left atrial size was normal.  5. Right atrial size was normal.  6. The mitral valve is normal in structure. Trace mitral valve  regurgitation.  7. The tricuspid valve is normal in structure. Tricuspid valve  regurgitation is trivial.  8. The aortic valve is tricuspid. Aortic valve regurgitation is not  visualized. No evidence of aortic valve sclerosis or stenosis.  9. The pulmonic valve was not well visualized. Pulmonic valve  regurgitation is trivial.  10. Normal pulmonary artery systolic pressure.  11. The inferior vena cava is normal in size with greater than 50%  respiratory variability, suggesting right atrial pressure of 3 mmHg.  _______________  Event Monitor 09/24/2019 to 10/28/2019: 1: Sinus rhythm/sinus bradycardia/sinus tachycardia 2: Occasional PVCs  Labs/Other Tests and  Data Reviewed:    EKG: Most recent EKG from 02/17/2020 personally reviewed and demonstrates: Normal sinus rhythm, rate 76 bpm, with possible left atrial enlargement and LVH. Significant artifact in leads V4-V6 but no acute ST/T changes noted.  Recent Labs: 10/30/2019: ALT 13; Hemoglobin 12.5; Platelets 304 06/03/2020: BUN 7; Potassium 4.1; Sodium 143; TSH 0.381 10/12/2020: Creatinine, Ser 0.77   Recent Lipid Panel Lab Results  Component Value Date/Time   CHOL 228 (H) 09/08/2019 09:49 AM   TRIG 109 09/08/2019 09:49 AM   HDL 62 09/08/2019 09:49 AM   CHOLHDL 3.7 09/08/2019 09:49 AM   CHOLHDL 3.7 05/03/2015 03:59 PM   LDLCALC 147 (H) 09/08/2019 09:49 AM   LDLDIRECT 129 (H) 04/06/2009 08:38 PM    Wt Readings from Last 3 Encounters:  10/12/20 207 lb (93.9 kg)  09/17/20 210 lb 12.8 oz (95.6 kg)  09/15/20 206 lb (93.4 kg)     Objective:    Vital Signs:  Ht 5\' 2"  (1.575 m)    BMI 37.86 kg/m    Vital Signs Reviewed. General: No acute distress. Pulm: No labored breathing. No coughing during visit. No audible wheezing. Speaking in full sentences. Neuro: Alert and oriented. No slurred speech. Answers questions appropriately. Psych: Pleasant affect.   ASSESSMENT & PLAN:    History of Syncope - Patient has had 2 episodes of syncope. Last episode occurred in 09/2019. No recurrence since then.  - Echo showed normal LVEF with no significant valvular disease. - 30-Day Event Monitor showed no concerning arrhythmias.  - Patient was referred to Neurology. EEG was normal and MRI has been ordered.   Palpitations - Patient reports some heart racing which she feels is due to coffee. She has tried cutting back on this which has helped some. - Monitor in 09/2019 showed occasional PVCs but no concerning arrhythmias. - Continue Lopressor 50mg  twice daily. - Recommended decreasing caffeine intake. - If palpitations worsen, can consider increasing Lopressor.  Hypertension - BP 108/93 today. Usually  well controlled at home. - Continue Amlodipine 10mg  daily and Lopressor 50mg  twice daily.  - Patient to continue to monitor BP and will let us know if consistently above 130/80.  Hyperlipidemia - Last lipid panel from 08/2019: Total Cholesterol 228, Triglycerides 109, HDL 62, LDL 147.  - Continue Crestor 5mg  daily. - Will have patient come back in for fasting lipid panel and LFTS.   Memory Loss - Stable. - Being worked up by Neurology. MRI has been ordered.  COVID-19 Education: The signs and symptoms of COVID-19 were discussed with the patient and how to seek care for testing (follow up with PCP or arrange E-visit).  The importance of social distancing was discussed today.  Time:  Today, I have spent 12 minutes with the patient with telehealth technology discussing the above problems.     Medication Adjustments/Labs and Tests Ordered: Current medicines are reviewed at length with the patient today.  Concerns regarding medicines are outlined above.   Follow Up:  Follow-up with Dr. Gwenlyn Found in 6 months.  Signed, Darreld Mclean, PA-C  10/25/2020 9:38 AM    [facility]

## 2020-10-25 ENCOUNTER — Telehealth (INDEPENDENT_AMBULATORY_CARE_PROVIDER_SITE_OTHER): Payer: Medicare Other | Admitting: Student

## 2020-10-25 ENCOUNTER — Encounter: Payer: Self-pay | Admitting: Student

## 2020-10-25 VITALS — BP 108/93 | HR 73 | Ht 62.0 in

## 2020-10-25 DIAGNOSIS — Z87898 Personal history of other specified conditions: Secondary | ICD-10-CM

## 2020-10-25 DIAGNOSIS — R002 Palpitations: Secondary | ICD-10-CM

## 2020-10-25 DIAGNOSIS — R413 Other amnesia: Secondary | ICD-10-CM

## 2020-10-25 DIAGNOSIS — I10 Essential (primary) hypertension: Secondary | ICD-10-CM | POA: Diagnosis not present

## 2020-10-25 DIAGNOSIS — E785 Hyperlipidemia, unspecified: Secondary | ICD-10-CM

## 2020-10-25 NOTE — Patient Instructions (Addendum)
Medication Instructions:  The current medical regimen is effective;  continue present plan and medications.  *If you need a refill on your cardiac medications before your next appointment, please call your pharmacy*   Lab Work: LIPID/LIVER in a few weeks (come fasting, no lab appointment needed)  If you have labs (blood work) drawn today and your tests are completely normal, you will receive your results only by: Marland Kitchen MyChart Message (if you have MyChart) OR . A paper copy in the mail If you have any lab test that is abnormal or we need to change your treatment, we will call you to review the results.  Follow-Up: At [facility], you and your health needs are our priority.  As part of our continuing mission to provide you with exceptional heart care, we have created designated Provider Care Teams.  These Care Teams include your primary Cardiologist (physician) and Advanced Practice Providers (APPs -  Physician Assistants and Nurse Practitioners) who all work together to provide you with the care you need, when you need it.  We recommend signing up for the patient portal called "MyChart".  Sign up information is provided on this After Visit Summary.  MyChart is used to connect with patients for Virtual Visits (Telemedicine).  Patients are able to view lab/test results, encounter notes, upcoming appointments, etc.  Non-urgent messages can be sent to your provider as well.   To learn more about what you can do with MyChart, go to [email or web address].    Your next appointment:   6 month(s)  The format for your next appointment:   In Person  Provider:   Quay Burow, MD   Other Instructions Monitor BP at home- notify us if above 130/80 Decrease caffeine intake to see if it helps with palpitations.

## 2020-10-26 ENCOUNTER — Other Ambulatory Visit: Payer: Self-pay | Admitting: Cardiovascular Disease

## 2020-11-25 ENCOUNTER — Other Ambulatory Visit: Payer: Self-pay | Admitting: *Deleted

## 2020-11-25 MED ORDER — AMLODIPINE BESYLATE 10 MG PO TABS: 10.0000 mg | ORAL_TABLET | Freq: Every morning | ORAL | 0 refills | Status: DC

## 2020-12-07 ENCOUNTER — Other Ambulatory Visit: Payer: Self-pay | Admitting: Cardiovascular Disease

## 2020-12-15 ENCOUNTER — Other Ambulatory Visit: Payer: Self-pay | Admitting: *Deleted

## 2020-12-16 ENCOUNTER — Other Ambulatory Visit: Payer: Self-pay

## 2020-12-16 MED ORDER — AMLODIPINE BESYLATE 10 MG PO TABS: 10.0000 mg | ORAL_TABLET | Freq: Every morning | ORAL | 0 refills | Status: DC

## 2020-12-20 MED ORDER — AMLODIPINE BESYLATE 10 MG PO TABS: 10.0000 mg | ORAL_TABLET | Freq: Every morning | ORAL | 0 refills | Status: DC

## 2020-12-20 NOTE — Telephone Encounter (Signed)
Patient calls nurse line regarding rx refill. Per chart review, rx was set to "no print" and never received by pharmacy. Resent electronically.   Talbot Grumbling, RN

## 2020-12-20 NOTE — Addendum Note (Signed)
Addended by: Talbot Grumbling on: 12/20/2020 09:40 AM   Modules accepted: Orders

## 2020-12-23 ENCOUNTER — Ambulatory Visit (INDEPENDENT_AMBULATORY_CARE_PROVIDER_SITE_OTHER): Payer: Medicare Other | Admitting: Family Medicine

## 2020-12-23 ENCOUNTER — Other Ambulatory Visit: Payer: Self-pay

## 2020-12-23 ENCOUNTER — Encounter: Payer: Self-pay | Admitting: Family Medicine

## 2020-12-23 DIAGNOSIS — R1084 Generalized abdominal pain: Secondary | ICD-10-CM | POA: Diagnosis not present

## 2020-12-23 DIAGNOSIS — R413 Other amnesia: Secondary | ICD-10-CM

## 2020-12-23 NOTE — Assessment & Plan Note (Signed)
Patient with generalized abdominal pain that has been improving with supportive treatment with digestive pills.  No red flag symptoms.  Her colonoscopy is up-to-date.  She has no prior abdominal surgeries concerning for adhesions.  -Continue digestive pills for symptomatic relief -Return precautions discussed with patient -Encouraged exercise

## 2020-12-23 NOTE — Patient Instructions (Addendum)
It was wonderful to see you today!  Please bring ALL of your medications with you to every visit.   Please call [facility] [phone] to follow up on the MRI that was scheduled.   Keep on doing what you are doing as far as your abdominal pain, it seems that it is improving! I am glad to hear that. Please call and schedule an appointment if the pain worsens.   Thank you for choosing [facility].   Please call [phone] with any questions about today's appointment.  Please be sure to schedule follow up at the front  desk before you leave today.   Sharion Settler, DO PGY-1 Family Medicine

## 2020-12-23 NOTE — Assessment & Plan Note (Signed)
Has been evaluated by neurology.  On B12 supplementation due to recent lab work showing deficiency.  This has been ongoing, unsure if worsening.  Would recommend that she contact neurology to follow-up on MRI brain that was scheduled. -Follow-up on MRI brain -Follow-up with neurology

## 2020-12-23 NOTE — Progress Notes (Signed)
    SUBJECTIVE:   CHIEF COMPLAINT / HPI:   Abdominal Pain Patient presents today to discuss her abdominal pain.  She has been having this pain for a while but actually feels that it is getting better.  She has been using digestive supplements that have helped.  She feels that her bowel movements are much more regular.  She goes every other day.  Her bloating is also improved.  She has plans to pick up on her exercise.  Denies any nausea, vomiting, diarrhea, constipation.  No blood in her stools.  No previous abdominal surgeries. She is up-to-date on her colonoscopy.  Memory Issues  Patient states that she went to heart care instead of coming for this appointment because she got confused on which appointment she had.  She also notes that sometimes her friends mentioned that she is not as sharp as she used to be.  She was seen by neurology in December where they did some blood work that showed low B12.  She was recommended to start B12 supplementation at that time.  She states that she is taking this.  She is also scheduled for MRI of her brain.  She has not yet heard from the radiology department for scheduling.  PERTINENT  PMH / PSH:  Past Medical History:  Diagnosis Date  . Allergy   . Anxiety   . 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 Carpal tunnel syndrome, bilateral   . Chest pain   . Heart murmur    age 31 said had heart murmur.  . Hyperlipidemia   . Hypertension   . Normal cardiac stress test    Myoview stress test  . OAB (overactive bladder) 09/01/2015  . Osteopenia 05/2018   T score -1.1 FRAX 2.5%/ 0.1%     OBJECTIVE:   BP (!) 140/92   Pulse 81   Wt 209 lb (94.8 kg)   SpO2 97%   BMI 38.23 kg/m    General: NAD, pleasant, able to participate in exam Cardiac: RRR, systolic murmur. Respiratory: CTAB, normal effort, No wheezes, rales or rhonchi Abdomen: Bowel sounds present, nontender, nondistended, no rebound or guarding, no hepatosplenomegaly. Extremities: no  edema or cyanosis. Skin: warm and dry, no rashes noted Neuro: alert, no obvious focal deficits Psych: Normal affect and mood, slow to answer some questions  ASSESSMENT/PLAN:   Abdominal pain Patient with generalized abdominal pain that has been improving with supportive treatment with digestive pills.  No red flag symptoms.  Her colonoscopy is up-to-date.  She has no prior abdominal surgeries concerning for adhesions.  -Continue digestive pills for symptomatic relief -Return precautions discussed with patient -Encouraged exercise  Short-term memory loss Has been evaluated by neurology.  On B12 supplementation due to recent lab work showing deficiency.  This has been ongoing, unsure if worsening.  Would recommend that she contact neurology to follow-up on MRI brain that was scheduled. -Follow-up on MRI brain -Follow-up with neurology     Sharion Settler, [place]    This note was prepared using Dragon voice recognition software and may include unintentional dictation errors due to the inherent limitations of voice recognition software.

## 2020-12-27 ENCOUNTER — Telehealth: Payer: Self-pay | Admitting: Neurology

## 2020-12-27 NOTE — Telephone Encounter (Signed)
Patient left a voicemail on my phone wanted to schedule or r/s her MRI. I called her back but she didn't pick up I left her a voicemail informing her to give [facility] a call at [phone] and they will schedule it there because she has a medicare plan.

## 2021-01-03 ENCOUNTER — Other Ambulatory Visit: Payer: Self-pay

## 2021-01-06 ENCOUNTER — Other Ambulatory Visit: Payer: Self-pay | Admitting: Family Medicine

## 2021-01-06 DIAGNOSIS — M792 Neuralgia and neuritis, unspecified: Secondary | ICD-10-CM

## 2021-01-13 ENCOUNTER — Encounter: Payer: Self-pay | Admitting: Neurology

## 2021-01-13 ENCOUNTER — Ambulatory Visit: Payer: Medicare Other | Admitting: Neurology

## 2021-01-13 VITALS — BP 137/72 | HR 89 | Ht 62.0 in | Wt 209.0 lb

## 2021-01-13 DIAGNOSIS — R413 Other amnesia: Secondary | ICD-10-CM

## 2021-01-13 DIAGNOSIS — Z87898 Personal history of other specified conditions: Secondary | ICD-10-CM

## 2021-01-13 NOTE — Progress Notes (Signed)
I have read the note, and I agree with the clinical assessment and plan.  Moyses Pavey K Thatcher Doberstein   

## 2021-01-13 NOTE — Progress Notes (Signed)
PATIENT: [name] DOB: August 08, 1956  REASON FOR VISIT: follow up HISTORY FROM: patient  HISTORY OF PRESENT ILLNESS: Today 01/13/21 [name] is a 65 year old female with history of syncopal event in October 2020, another brief event in April 2021, general reported feeling spacey at times.  Cardiac work-up has been unrevealing, EEG in July 2021 was normal.  Scheduled for MRI of the brain 01/16/21.  Labs showed mildly low B12 230, took supplement but not anymore. MMSE 25/30 today. No more passing out spells. Her son lives with her, claims feels like herself. If asked a question on the spot, takes her some time to find the answer, has to think about it. Now tries to stay in the moment, meaning in the doctor's office today makes sure not to venture off with thoughts, otherwise would get her overwhelmed . Didn't remember having to do that. Her friend remarks she used to be sharper. Manges her own affairs, manages her household, does okay driving. Thinks all this started since she fell October 2020. Doesn't think depression, has always looked for the positive. There are no general events of confusion more than others, that may be seizures. She thinks she is getting better. Less people are saying things to her about her behaviors. PCP follows TSH. Her children are more trusting her more, she is here alone today.   Update 10/12/2020 SS: [name] is a 65 year old female with history of syncopal event in October 2020, another brief event in April 2021, general reported feeling spacey at times.  Cardiac work-up has been unrevealing.  EEG in July 2021 was normal.  MRI of the brain has been ordered but has yet to be completed. Here today, no complaints.  Reviewed the previous history, saw her PCP few weeks ago, family expressed concerns that she was not acting right. At times has short-term memory loss, trouble recalling.  There was a time she was driving, had a friend with her, could not remember where she  was going, but was able to talk it out with herself and figure it out.  This often happens, she talks things out when she can't remember (did this today with the date, remembering when Christmas was and counting forward).  She mostly lives alone, but her son is in and out.  Reportedly manages her household well, handles her own affairs.  She thinks her trouble with recall is normal, part of aging.  Thinks her children are overreacting.  Admits to some anxiety, worries about her children.  No falls.  No further blackout spells. At times just feels confused but can reason her way to understanding.  Here today for follow-up unaccompanied.  HISTORY 04/09/2020 Dr. Jannifer Mcclure: [name] is a 65 year old right-handed black female with a history of a syncopal event that occurred around 25 July 2019.  The patient was having a conversation with someone and then suddenly blacked out without any warning, falling into a door and then down to the ground.  She was unconscious only for few seconds and then when she came to she continued her conversation as if nothing happened.  The patient has not had any recurrence of this event before or after.  She did have a brief event in April 2021 where she felt zoned out and had to reorient herself.  She feels spacey at times, this has been an issue that has been going on for greater than a year.  She reports no definite memory problems, she denies problems with getting lost  with driving, she is able to manage her medications and appointments and do her finances without difficulty.  She denies any fatigue or drowsiness during the day, she sleeps well at night.  She has no headaches, she denies any dizziness, palpitations of the heart, or any numbness or weakness of the face, arms, legs.  The patient denies any balance issues or difficulty controlling the bowels or the bladder.  She has undergone a cardiac work-up that did not show any cardiac source of the blackout.  She is referred to  this office for further evaluation.   REVIEW OF SYSTEMS: Out of a complete 14 system review of symptoms, the patient complains only of the following symptoms, and all other reviewed systems are negative.  Memory loss  ALLERGIES: Allergies  Allergen Reactions  . Hctz [Hydrochlorothiazide] Other (See Comments)    Stopped after syncopal spell- unclear if it was related  . Hydrocodone     Mood disturbance  . Simvastatin     REACTION: muscle aches, GI complaints    HOME MEDICATIONS: Outpatient Medications Prior to Visit  Medication Sig Dispense Refill  . albuterol (PROVENTIL HFA) 108 (90 Base) MCG/ACT inhaler INHALE TWO PUFFS BY MOUTH INTO THE LUNGS EVERY 6 HOURS AS NEEDED FOR WHEEZING OR SHORTNESS OF BREATH 18 g 2  . amLODipine (NORVASC) 10 MG tablet Take 1 tablet (10 mg total) by mouth in the morning. 30 tablet 0  . capsaicin (ZOSTRIX) 0.025 % cream Apply topically 2 (two) times daily. 60 g 0  . cholecalciferol (VITAMIN D3) 25 MCG (1000 UNIT) tablet Take 1,000 Units by mouth daily.    . fluticasone (FLONASE) 50 MCG/ACT nasal spray USE ONE SPRAY(S) IN EACH NOSTRIL TWICE DAILY (Patient taking differently: Place 1 spray into both nostrils as needed.) 16 g 6  . fluticasone (FLONASE) 50 MCG/ACT nasal spray Place 1 spray into both nostrils as needed for allergies or rhinitis. 1 g 3  . gabapentin (NEURONTIN) 100 MG capsule Take 1 capsule by mouth once daily 30 capsule 0  . loratadine (EQ ALLERGY RELIEF) 10 MG tablet Take 1 tablet (10 mg total) by mouth daily. 90 tablet 3  . metoprolol tartrate (LOPRESSOR) 50 MG tablet Take 1 tablet (50 mg total) by mouth 2 (two) times daily. 180 tablet 3  . rosuvastatin (CRESTOR) 5 MG tablet TAKE ONE TABLET BY MOUTH EVERY MOUTH MONDAY AND FRIDAY 10 tablet 8   No facility-administered medications prior to visit.    PAST MEDICAL HISTORY: Past Medical History:  Diagnosis Date  . Allergy   . Anxiety   . 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 Carpal  tunnel syndrome, bilateral   . Chest pain   . Heart murmur    age 30 said had heart murmur.  . Hyperlipidemia   . Hypertension   . Normal cardiac stress test    Myoview stress test  . OAB (overactive bladder) 09/01/2015  . Osteopenia 05/2018   T score -1.1 FRAX 2.5%/ 0.1%    PAST SURGICAL HISTORY: Past Surgical History:  Procedure Laterality Date  . COLONOSCOPY    . POLYPECTOMY    . 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 TOTAL HIP ARTHROPLASTY Left 10/01/2013   Procedure: TOTAL HIP ARTHROPLASTY;  Surgeon: Kerin Salen, MD;  Location: [facility];  Service: Orthopedics;  Laterality: Left;    FAMILY HISTORY: Family History  Problem Relation Age of Onset  . Hypertension Mother   . Diabetes Father   .  Hyperlipidemia Brother   . Breast cancer Paternal Grandmother   . Heart attack Neg Hx   . Sudden death Neg Hx   . Colon cancer Neg Hx     SOCIAL HISTORY: Social History   Socioeconomic History  . Marital status: Divorced    Spouse name: Not on file  . Number of children: 3  . Years of education: Not on file  . Highest education level: Not on file  Occupational History  . 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 Smoking status: Never Smoker  . Smokeless tobacco: Never Used  Vaping Use  . Vaping Use: Never used  Substance and Sexual Activity  . Alcohol use: No    Alcohol/week: 0.0 standard drinks  . Drug use: No  . Sexual activity: Yes    Partners: Male    Birth control/protection: Post-menopausal  Other Topics Concern  . Not on file  Social History Narrative   Patient lives alone in [place].    Patient has 3 children and has grandchildren.    Patient enjoys reading, spending time with her family, and very involved in her church.    Social Determinants of Health   Financial Resource Strain: Low Risk   . Difficulty of Paying Living  Expenses: Not hard at all  Food Insecurity: No Food Insecurity  . Worried About Charity fundraiser in the Last Year: Never true  . Ran Out of Food in the Last Year: Never true  Transportation Needs: No Transportation Needs  . Lack of Transportation (Medical): No  . Lack of Transportation (Non-Medical): No  Physical Activity: Insufficiently Active  . Days of Exercise per Week: 3 days  . Minutes of Exercise per Session: 20 min  Stress: No Stress Concern Present  . Feeling of Stress : Only a little  Social Connections: Moderately Integrated  . Frequency of Communication with Friends and Family: More than three times a week  . Frequency of Social Gatherings with Friends and Family: More than three times a week  . Attends Religious Services: More than 4 times per year  . Active Member of Clubs or Organizations: Yes  . Attends Archivist Meetings: More than 4 times per year  . Marital Status: Divorced  Human resources officer Violence: Not At Risk  . Fear of Current or Ex-Partner: No  . Emotionally Abused: No  . Physically Abused: No  . Sexually Abused: No   PHYSICAL EXAM  Vitals:   01/13/21 0852  BP: 137/72  Pulse: 89  Weight: 209 lb (94.8 kg)  Height: 5\' 2"  (1.575 m)   Body mass index is 38.23 kg/m.  Generalized: Well developed, in no acute distress  MMSE - Mini Mental State Exam 01/13/2021 10/12/2020  Orientation to time 3 5  Orientation to Place 5 5  Registration 3 3  Attention/ Calculation 2 2  Recall 3 3  Language- name 2 objects 2 2  Language- repeat 1 1  Language- follow 3 step command 3 3  Language- read & follow direction 1 1  Write a sentence 1 1  Copy design 1 1  Total score 25 27    Neurological examination  Mentation: Alert oriented to time, place, history taking. Follows all commands speech and language fluent Cranial nerve II-XII: Pupils were equal round reactive to light. Extraocular movements were full, visual field were full on  confrontational  test. Facial sensation and strength were normal. Head turning and shoulder shrug  were normal and symmetric. Motor: The motor testing reveals 5 over 5 strength of all 4 extremities. Good symmetric motor tone is noted throughout.  Sensory: Sensory testing is intact to soft touch on all 4 extremities. No evidence of extinction is noted.  Coordination: Cerebellar testing reveals good finger-nose-finger and heel-to-shin bilaterally.  Gait and station: Gait is normal. Tandem gait is slightly unsteady. Romberg is negative. No drift is seen.  Reflexes: Deep tendon reflexes are symmetric and normal bilaterally.   DIAGNOSTIC DATA (LABS, IMAGING, TESTING) - I reviewed patient records, labs, notes, testing and imaging myself where available.  Lab Results  Component Value Date   WBC 8.2 10/30/2019   HGB 12.5 10/30/2019   HCT 38.9 10/30/2019   MCV 85 10/30/2019   PLT 304 10/30/2019      Component Value Date/Time   NA 143 06/03/2020 1234   K 4.1 06/03/2020 1234   CL 108 (H) 06/03/2020 1234   CO2 24 06/03/2020 1234   GLUCOSE 75 06/03/2020 1234   GLUCOSE 86 05/03/2015 1559   BUN 7 (L) 06/03/2020 1234   CREATININE 0.77 10/12/2020 0825   CREATININE 0.75 05/03/2015 1559   CALCIUM 10.4 (H) 06/03/2020 1234   PROT 7.1 10/30/2019 1453   ALBUMIN 4.5 10/30/2019 1453   AST 13 10/30/2019 1453   ALT 13 10/30/2019 1453   ALKPHOS 147 (H) 10/30/2019 1453   BILITOT 0.2 10/30/2019 1453   GFRNONAA 82 10/12/2020 0825   GFRNONAA >89 01/15/2014 1509   GFRAA 95 10/12/2020 0825   GFRAA >89 01/15/2014 1509   Lab Results  Component Value Date   CHOL 228 (H) 09/08/2019   HDL 62 09/08/2019   LDLCALC 147 (H) 09/08/2019   LDLDIRECT 129 (H) 04/06/2009   TRIG 109 09/08/2019   CHOLHDL 3.7 09/08/2019   Lab Results  Component Value Date   HGBA1C 5.2 05/03/2015   Lab Results  Component Value Date   VITAMINB12 230 (L) 10/12/2020   Lab Results  Component Value Date   TSH 0.381 (L) 06/03/2020   ASSESSMENT  AND PLAN 65 y.o. year old female  has a past medical history of Allergy, Anxiety, Arthritis, Carpal tunnel syndrome, bilateral, Chest pain, Heart murmur, Hyperlipidemia, Hypertension, Normal cardiac stress test, OAB (overactive bladder) (09/01/2015), and Osteopenia (05/2018). here with:  1.  Episode of syncope 2.  Reports of confusion, memory loss -DD includes seizure vs memory loss vs anxiety? -Having MRI brain done in a few days  -EEG was normal, MMSE today was 25/30 -In December 2021, B12 low 230; RPR negative, sed rate normal; started B12, but did not sustain it, will recheck today -No spells of more confusion/spacey than others that may suggest seizures -Last syncopal spell occurred in April 2021, was a mild spell, all of this started after initial syncopal spell in October 2020 -Is again here alone today, so no collateral information was obtained -Today, she appears appropriate, does well to recall details, when asked about history, she replays the steps in her mind (going to [facility] to buy B12) -Will follow-up in 6 months or sooner if needed, continue to follow over time, she is to let me know of any problems or concerns  I spent 30 minutes of face-to-face and non-face-to-face time with patient.  This included previsit chart review, lab review, study review, order entry, electronic health record documentation, patient education.  Butler Denmark, AGNP-C, DNP 01/13/2021, 8:59 AM [facility]  [address], [address], [place] [address] [phone]

## 2021-01-13 NOTE — Patient Instructions (Signed)
Having MRI soon  Check B12 today  Let me know of any confusion spells  See you back in 6 months

## 2021-01-14 LAB — VITAMIN B12: Vitamin B-12: 220 pg/mL — ABNORMAL LOW (ref 232–1245)

## 2021-01-16 ENCOUNTER — Ambulatory Visit
Admission: RE | Admit: 2021-01-16 | Discharge: 2021-01-16 | Disposition: A | Discharge status: Home / Self Care | Payer: Medicare Other | Source: Ambulatory Visit | Attending: Neurology | Admitting: Neurology

## 2021-01-16 ENCOUNTER — Other Ambulatory Visit: Payer: Self-pay

## 2021-01-16 DIAGNOSIS — R55 Syncope and collapse: Secondary | ICD-10-CM

## 2021-01-16 MED ORDER — GADOBENATE DIMEGLUMINE 529 MG/ML IV SOLN
15.0000 mL | Freq: Once | INTRAVENOUS | Status: AC | PRN
  Administered 2021-01-16: 15 mL via INTRAVENOUS

## 2021-01-17 ENCOUNTER — Telehealth: Payer: Self-pay | Admitting: Neurology

## 2021-01-17 ENCOUNTER — Telehealth: Payer: Self-pay | Admitting: Family Medicine

## 2021-01-17 ENCOUNTER — Other Ambulatory Visit: Payer: Self-pay | Admitting: Family Medicine

## 2021-01-17 DIAGNOSIS — E538 Deficiency of other specified B group vitamins: Secondary | ICD-10-CM

## 2021-01-17 MED ORDER — CYANOCOBALAMIN 1000 MCG/ML IJ SOLN
1000.0000 ug | Freq: Once | INTRAMUSCULAR | Status: AC
  Administered 2021-01-21: 1000 ug via INTRAMUSCULAR

## 2021-01-17 NOTE — Telephone Encounter (Signed)
Discussed continued low B12 supplementation with patient. Given that she has been unsuccessful with oral supplementation, will start B12 injections. Appointment made for first injection this Friday, 4/8.

## 2021-01-17 NOTE — Telephone Encounter (Signed)
Called pt. Relayed SS,NP message. She will wait to hear from PCP to get set up potentially for B12 injections. She will reach out to them if she does not hear in the next week or so. Confirmed she had MRI yesterday. Advised we will call back once we have those results, they are still pending. She verbalized understanding

## 2021-01-17 NOTE — Telephone Encounter (Signed)
B12 level is still low 220, tells me is no longer taking supplement, I think she told her PCP she was. Will route to PCP as well to see if they set up injections for her to do, may need eval if it doesn't improve. MRI was completed yesterday, but I don't have report yet. Thanks!

## 2021-01-17 NOTE — Progress Notes (Signed)
Discussed on phone with patient regarding low Vitamin B12 level again. Admits to not taking B12 supplementation because she often would forget to do this. Has not had any supplementation in about 6 months. Is being followed with Neurology for memory issues. She is amenable to having B12 injections done. Will do once a week injections (1057mcg Vit B12) x4 weeks and then space out to monthly and recheck level. MRI brain performed yesterday, results still pending. Patient scheduled for RN visit for first injection on 4/8 at 930AM.

## 2021-01-17 NOTE — Telephone Encounter (Signed)
Hi!  I have reached out to [facility] regarding B12 injections. She will come in this Friday for her first.   Thanks for helping to care for her! Dawson Bills

## 2021-01-19 ENCOUNTER — Other Ambulatory Visit: Payer: Self-pay | Admitting: Family Medicine

## 2021-01-19 ENCOUNTER — Telehealth: Payer: Self-pay | Admitting: Neurology

## 2021-01-19 DIAGNOSIS — Z8639 Personal history of other endocrine, nutritional and metabolic disease: Secondary | ICD-10-CM

## 2021-01-19 NOTE — Telephone Encounter (Signed)
I called the patient.  MRI of the brain shows very minimal white matter changes, the patient [name] that she is getting B12 injections currently, may wish to stay on injections versus taking oral tablets.  No recent blackouts reported.    MRI brain 01/17/21:  IMPRESSION: This MRI of the brain with and without contrast shows the following: 1.   Some scattered T2/FLAIR hyperintense foci in the subcortical and deep white matter of the cerebral hemispheres.  This is a nonspecific finding and most likely represents mild, age-appropriate, chronic microvascular ischemic change.  None of the foci appear to be acute. 2.   The pituitary gland is thinned within a slightly enlarged sella turcica consistent with a partially empty sella.  Although this is often incidental, consider idiopathic intracranial hypertension if papilledema is also observed. 3.    No acute findings.  Normal enhancement pattern.

## 2021-01-21 ENCOUNTER — Other Ambulatory Visit: Payer: Self-pay

## 2021-01-21 ENCOUNTER — Ambulatory Visit (INDEPENDENT_AMBULATORY_CARE_PROVIDER_SITE_OTHER): Payer: Medicare Other

## 2021-01-21 DIAGNOSIS — E538 Deficiency of other specified B group vitamins: Secondary | ICD-10-CM

## 2021-01-21 NOTE — Progress Notes (Signed)
Pt is here for a b12 injection today.    Date of last office visit that b12 was discussed 12/23/2020  First in series of four weekly b12 injections provided today.   Injection given in RD deltoid, pt tolerated well and scheduled for next Thursday 4/14, as office will be closed next Friday. Talbot Grumbling, RN

## 2021-01-22 ENCOUNTER — Other Ambulatory Visit: Payer: Self-pay | Admitting: Family Medicine

## 2021-01-27 ENCOUNTER — Ambulatory Visit (INDEPENDENT_AMBULATORY_CARE_PROVIDER_SITE_OTHER): Payer: Medicare Other

## 2021-01-27 ENCOUNTER — Other Ambulatory Visit: Payer: Self-pay

## 2021-01-27 DIAGNOSIS — E538 Deficiency of other specified B group vitamins: Secondary | ICD-10-CM | POA: Diagnosis not present

## 2021-01-27 MED ORDER — CYANOCOBALAMIN 1000 MCG/ML IJ SOLN
1000.0000 ug | Freq: Once | INTRAMUSCULAR | Status: AC
  Administered 2021-01-27: 1000 ug via INTRAMUSCULAR

## 2021-01-27 NOTE — Progress Notes (Signed)
Pt is here for a b12 injection today.    Date of last office visit that b12 was discussed 12/23/20  Last injection was 01/21/2021  Injection given in LD deltoid. Scheduled patient for next weekly injection on 4/22 at 9 am.   Talbot Grumbling, RN

## 2021-02-04 ENCOUNTER — Ambulatory Visit (INDEPENDENT_AMBULATORY_CARE_PROVIDER_SITE_OTHER): Payer: Medicare Other

## 2021-02-04 ENCOUNTER — Other Ambulatory Visit: Payer: Self-pay | Admitting: Family Medicine

## 2021-02-04 ENCOUNTER — Other Ambulatory Visit: Payer: Self-pay

## 2021-02-04 DIAGNOSIS — E538 Deficiency of other specified B group vitamins: Secondary | ICD-10-CM

## 2021-02-04 MED ORDER — CYANOCOBALAMIN 1000 MCG/ML IJ SOLN
1000.0000 ug | Freq: Once | INTRAMUSCULAR | Status: AC
  Administered 2021-02-04: 1000 ug via INTRAMUSCULAR

## 2021-02-04 NOTE — Progress Notes (Signed)
Pt is here for a b12 injection today.    Date of last office visit that b12 was discussed 12/23/2020  Last injection was 01/27/2021  Injection given in RD deltoid, pt tolerated well and scheduled for 4th weekly injection next Friday 4/29 at 9:30 am.   Talbot Grumbling, RN

## 2021-02-09 ENCOUNTER — Other Ambulatory Visit: Payer: Self-pay | Admitting: Family Medicine

## 2021-02-09 DIAGNOSIS — M792 Neuralgia and neuritis, unspecified: Secondary | ICD-10-CM

## 2021-02-11 ENCOUNTER — Other Ambulatory Visit: Payer: Self-pay

## 2021-02-11 ENCOUNTER — Ambulatory Visit (INDEPENDENT_AMBULATORY_CARE_PROVIDER_SITE_OTHER): Payer: Medicare Other

## 2021-02-11 DIAGNOSIS — E538 Deficiency of other specified B group vitamins: Secondary | ICD-10-CM

## 2021-02-11 MED ORDER — CYANOCOBALAMIN 1000 MCG/ML IJ SOLN
1000.0000 ug | Freq: Once | INTRAMUSCULAR | Status: AC
  Administered 2021-02-11: 1000 ug via INTRAMUSCULAR

## 2021-02-11 NOTE — Progress Notes (Signed)
Pt is here for a b12 injection today.    Date of last office visit that b12 was discussed 12/23/2020  Last injection was 02/04/2021  Injection given in LD deltoid, pt tolerated well. Patient to receive lab work today for repeat B12.   Talbot Grumbling, RN

## 2021-02-12 LAB — VITAMIN B12: Vitamin B-12: >2000 pg/mL — ABNORMAL HIGH (ref 232–1245)

## 2021-03-01 ENCOUNTER — Other Ambulatory Visit: Payer: Self-pay | Admitting: Family Medicine

## 2021-03-01 DIAGNOSIS — M792 Neuralgia and neuritis, unspecified: Secondary | ICD-10-CM

## 2021-04-06 ENCOUNTER — Ambulatory Visit (INDEPENDENT_AMBULATORY_CARE_PROVIDER_SITE_OTHER): Payer: Medicare Other | Admitting: Endocrinology

## 2021-04-06 ENCOUNTER — Encounter: Payer: Self-pay | Admitting: Endocrinology

## 2021-04-06 ENCOUNTER — Other Ambulatory Visit: Payer: Self-pay

## 2021-04-06 VITALS — BP 148/100 | HR 83 | Ht 62.0 in | Wt 204.8 lb

## 2021-04-06 DIAGNOSIS — E0590 Thyrotoxicosis, unspecified without thyrotoxic crisis or storm: Secondary | ICD-10-CM | POA: Diagnosis not present

## 2021-04-06 LAB — TSH: TSH: 0.77 u[IU]/mL (ref 0.35–4.50)

## 2021-04-06 LAB — HEPATIC FUNCTION PANEL
ALT: 13 U/L (ref 0–35)
AST: 16 U/L (ref 0–37)
Albumin: 4.1 g/dL (ref 3.5–5.2)
Alkaline Phosphatase: 131 U/L — ABNORMAL HIGH (ref 39–117)
Bilirubin, Direct: 0.1 mg/dL (ref 0.0–0.3)
Total Bilirubin: 0.4 mg/dL (ref 0.2–1.2)
Total Protein: 7.2 g/dL (ref 6.0–8.3)

## 2021-04-06 LAB — VITAMIN D 25 HYDROXY (VIT D DEFICIENCY, FRACTURES): VITD: 25.64 ng/mL — ABNORMAL LOW (ref 30.00–100.00)

## 2021-04-06 LAB — T4, FREE: Free T4: 0.85 ng/dL (ref 0.60–1.60)

## 2021-04-06 NOTE — Progress Notes (Signed)
Subjective:    Patient ID: [name], female    DOB: [birth year], 65 y.o.   MRN: [id number]  HPI Pt is referred by Ardelia Mems, for hypercalcemia.  Pt was noted to have hypercalcemia in 2020.  she has never had osteoporosis, urolithiasis, parathyroid probs, sarcoidosis, cancer, PUD, or pancreatitis.   Only bony fracture was a foot (many years ago).  she does not take vitamin-A supplements.  Pt has no h/o any of these: [place], or prolonged immobilization.  Pt denies taking antacids or Li++. She takes vitamin-D, 1000 units/d.  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   Hyperlipidemia    Hypertension    Normal cardiac stress test    Myoview stress test   OAB (overactive bladder) 09/01/2015   Osteopenia 05/2018   T score -1.1 FRAX 2.5%/ 0.1%    Past Surgical History:  Procedure Laterality Date   COLONOSCOPY     POLYPECTOMY     TOTAL HIP ARTHROPLASTY Left 10/01/2013   DR Mayer Camel   TOTAL HIP ARTHROPLASTY Left 10/01/2013   Procedure: TOTAL HIP ARTHROPLASTY;  Surgeon: Kerin Salen, MD;  Location: [facility];  Service: Orthopedics;  Laterality: Left;    Social History   Socioeconomic History   Marital status: Divorced    Spouse name: Not on file   Number of children: 3   Years of education: Not on file   Highest education level: Not on file  Occupational History   Occupation: Heritage manager: [employer]    Comment: 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    Patient has 3 children and has grandchildren.    Patient enjoys reading, spending time with her family, and very involved in her church.    Social Determinants of Health   Financial Resource Strain: Low Risk    Difficulty of Paying Living Expenses: Not hard at all  Food Insecurity: No Food Insecurity   Worried About Charity fundraiser in the Last Year: Never true   [place] in the Last Year: Never true  Transportation Needs: No Transportation Needs   Lack of Transportation (Medical): No   Lack of Transportation (Non-Medical): No  Physical Activity: Insufficiently Active   Days of Exercise per Week: 3 days   Minutes of Exercise per Session: 20 min  Stress: No Stress Concern Present   Feeling of Stress : Only a little  Social Connections: Moderately Integrated   Frequency of Communication with Friends and Family: More than three times a week   Frequency of Social Gatherings with Friends and Family: More than three times a week   Attends Religious Services: More than 4 times per year   Active Member of [employer]  or Organizations: Yes   Attends Music therapist: More than 4 times per year   Marital Status: Divorced  Human resources officer Violence: Not At Risk   Fear of Current or Ex-Partner: No   Emotionally Abused: No   Physically Abused: No   Sexually Abused: No    Current Outpatient Medications on File Prior to Visit  Medication Sig Dispense Refill   albuterol (PROVENTIL HFA) 108 (90 Base) MCG/ACT inhaler INHALE TWO PUFFS BY MOUTH INTO THE LUNGS EVERY 6 HOURS AS NEEDED FOR WHEEZING OR SHORTNESS OF BREATH 18 g 2   amLODipine (NORVASC) 10 MG tablet TAKE 1 TABLET BY MOUTH IN THE MORNING 30 tablet 3   capsaicin (ZOSTRIX) 0.025 % cream Apply topically 2 (two) times daily. 60 g 0   cholecalciferol (VITAMIN D3) 25 MCG (1000 UNIT) tablet Take 1,000 Units by mouth daily.     fluticasone (FLONASE)  50 MCG/ACT nasal spray USE ONE SPRAY(S) IN EACH NOSTRIL TWICE DAILY (Patient taking differently: Place 1 spray into both nostrils as needed.) 16 g 6   gabapentin (NEURONTIN) 100 MG capsule Take 1 capsule by mouth once daily 30 capsule 0   loratadine (EQ ALLERGY RELIEF) 10 MG tablet Take 1 tablet (10 mg total) by mouth daily. 90 tablet 3   metoprolol tartrate (LOPRESSOR) 50 MG tablet Take 1 tablet (50 mg total) by mouth 2 (two) times daily. 180 tablet 3   rosuvastatin (CRESTOR) 5 MG tablet TAKE ONE TABLET BY MOUTH EVERY MOUTH MONDAY AND FRIDAY 10 tablet 8   No current facility-administered medications on file prior to visit.    Allergies  Allergen Reactions   Hctz [Hydrochlorothiazide] Other (See Comments)    Stopped after syncopal spell- unclear if it was related   Hydrocodone     Mood disturbance   Simvastatin     REACTION: muscle aches, GI complaints    Family History  Problem Relation Age of Onset   Hypertension Mother    Diabetes Father    Hyperlipidemia Brother    Breast cancer Paternal Grandmother    Heart attack Neg Hx    Sudden death Neg Hx    Colon cancer Neg Hx    Hypercalcemia Neg Hx     BP (!) 148/100 (BP Location: Left Arm, Patient Position: Sitting, Cuff Size: Normal)   Pulse 83   Ht 5\' 2"  (1.575 m)   Wt 204 lb 12.8 oz (92.9 kg)   SpO2 99%   BMI 37.46 kg/m      Review of Systems Denies weight loss, hematuria, depression, numbness, and back pain.      Objective:   Physical Exam VS: see vs page GEN: no distress HEAD: head: no deformity eyes: no periorbital swelling, no proptosis external nose and ears are normal NECK: supple, thyroid is not enlarged CHEST WALL: no kyphosis LUNGS: clear to auscultation CV: reg rate and rhythm, no murmur.  MUSCULOSKELETAL: gait is normal and steady EXTEMITIES: no deformity.  Trace bilat leg edema NEURO:  readily moves all 4's.  sensation is intact to touch on all 4's SKIN:  Normal texture and temperature.  No rash  or suspicious lesion is visible.   NODES:  None palpable at the neck PSYCH: alert, well-oriented.  Does not appear anxious nor depressed.    Lab Results  Component Value Date   PTH 49 06/03/2020   PTH Comment 06/03/2020   CALCIUM 10.4 (H) 06/03/2020   CAION 5.9 (H) 07/25/2019   PHOS 2.6 (L) 07/25/2019  Lab Results  Component Value Date   TSH 0.381 (L) 06/03/2020   Lab Results  Component Value Date   ALT 13 10/30/2019   AST 13 10/30/2019   ALKPHOS 147 (H) 10/30/2019   BILITOT 0.2 10/30/2019   I have reviewed outside records, and summarized: Pt was noted to have elevated A1c, and referred here.  She stopped HCTZ in 2021     Assessment & Plan:  Hypercalcemia, new to me, uncertain etiology and prognosis   Patient Instructions  Blood tests are requested for you today.  We'll let you know about the results.

## 2021-04-06 NOTE — Patient Instructions (Signed)
Blood tests are requested for you today.  We'll let you know about the results.  

## 2021-04-09 LAB — ALKALINE PHOSPHATASE, ISOENZYMES
Alkaline Phosphatase: 159 IU/L — ABNORMAL HIGH (ref 44–121)
BONE FRACTION: 59 % (ref 14–68)
INTESTINAL FRAC.: 4 % (ref 0–18)
LIVER FRACTION: 37 % (ref 18–85)

## 2021-04-15 LAB — PTH-RELATED PEPTIDE: PTH-Related Protein (PTH-RP): 13 pg/mL (ref 11–20)

## 2021-04-15 LAB — PTH, INTACT AND CALCIUM
Calcium: 10.4 mg/dL (ref 8.6–10.4)
PTH: 73 pg/mL (ref 16–77)

## 2021-04-15 LAB — VITAMIN D 1,25 DIHYDROXY
Vitamin D 1, 25 (OH)2 Total: 116 pg/mL — ABNORMAL HIGH (ref 18–72)
Vitamin D2 1, 25 (OH)2: <8 pg/mL
Vitamin D3 1, 25 (OH)2: 116 pg/mL

## 2021-04-15 LAB — VITAMIN A: Vitamin A (Retinoic Acid): 38 ug/dL (ref 38–98)

## 2021-04-26 ENCOUNTER — Other Ambulatory Visit: Payer: Self-pay

## 2021-04-26 MED ORDER — AMLODIPINE BESYLATE 10 MG PO TABS: 10.0000 mg | ORAL_TABLET | Freq: Every morning | ORAL | 3 refills | Status: DC

## 2021-04-28 ENCOUNTER — Other Ambulatory Visit: Payer: Self-pay

## 2021-04-28 ENCOUNTER — Telehealth (INDEPENDENT_AMBULATORY_CARE_PROVIDER_SITE_OTHER): Payer: Medicare Other | Admitting: Family Medicine

## 2021-04-28 DIAGNOSIS — R0989 Other specified symptoms and signs involving the circulatory and respiratory systems: Secondary | ICD-10-CM | POA: Insufficient documentation

## 2021-04-28 DIAGNOSIS — J988 Other specified respiratory disorders: Secondary | ICD-10-CM | POA: Insufficient documentation

## 2021-04-28 NOTE — Assessment & Plan Note (Signed)
Broad differentials including influenza, COVID, pneumonia etc. Of note pt had Recommend pt's is seen in urgent care/ER for lab work, CXR and testing for influenza and COVID. Return precautions given to pt who expressed understanding.

## 2021-04-28 NOTE — Assessment & Plan Note (Deleted)
Broad differential including COVID, influenza, PNA. Pt had PNA last year in Oct.

## 2021-04-28 NOTE — Progress Notes (Signed)
[facility] Telemedicine Visit  Patient consented to have virtual visit and was identified by name and date of birth. Method of visit: Video was attempted  Encounter participants: Patient: [name] - located at home  Provider: Lattie Haw - located at home   Chief Complaint: flu symptoms   HPI:  Respiratory symptoms Pt reports she started feeling unwell 1 week ago. Feels like she is "catching a flu", myalgias, cough (productive-clear/yellow), chest pain when coughing, headache, chills and congestion. Denies dyspnea or dizziness. Has not taken temperature. Denies known covid exposure. Pt reports she has had 2 covid vaccines. Previously when pt felt similar to this she was prescribed prednisone for 21 days. Has not taken medication at home. Starting to feel worse. Denies smoking, no underlying lung disease. Lives with son.   ROS: per HPI  Pertinent PMHx: obesity, hyperparathyroidism, OA  Exam:  There were no vitals taken for this visit.  Respiratory: speaking in full sentences   Assessment/Plan:  Respiratory infection Broad differentials including influenza, COVID, pneumonia etc. Of note pt had Recommend pt's is seen in urgent care/ER for lab work, CXR and testing for influenza and COVID. Return precautions given to pt who expressed understanding.    Time spent during visit with patient: 20 minutes

## 2021-05-04 ENCOUNTER — Ambulatory Visit (INDEPENDENT_AMBULATORY_CARE_PROVIDER_SITE_OTHER): Payer: Medicare Other | Admitting: Family Medicine

## 2021-05-04 ENCOUNTER — Other Ambulatory Visit: Payer: Self-pay

## 2021-05-04 ENCOUNTER — Telehealth: Payer: Self-pay | Admitting: Family Medicine

## 2021-05-04 ENCOUNTER — Telehealth: Payer: Self-pay

## 2021-05-04 VITALS — BP 136/80 | HR 73 | Temp 98.9°F | Ht 62.0 in

## 2021-05-04 DIAGNOSIS — J988 Other specified respiratory disorders: Secondary | ICD-10-CM

## 2021-05-04 DIAGNOSIS — R059 Cough, unspecified: Secondary | ICD-10-CM

## 2021-05-04 DIAGNOSIS — B9789 Other viral agents as the cause of diseases classified elsewhere: Secondary | ICD-10-CM | POA: Diagnosis not present

## 2021-05-04 MED ORDER — BUDESONIDE 90 MCG/ACT IN AEPB: 1.0000 | INHALATION_SPRAY | Freq: Two times a day (BID) | RESPIRATORY_TRACT | 0 refills | Status: AC

## 2021-05-04 NOTE — Progress Notes (Signed)
   SUBJECTIVE:   CHIEF COMPLAINT / HPI:   Chief Complaint  Patient presents with   Nasal Congestion     [name] is a 65 y.o. female here for ongoing nasal congestion. She has been sick for the past 2 weeks. Had similar sx when prior to colonoscopy. Pt reports headaches, chest congestion, chills, fatigue, cough worse at night, throat clearing, nasal congestion. Took  Benadryl and it helped. No fever but feels like she has elevated temperature.    PERTINENT  PMH / PSH: reviewed and updated as appropriate   OBJECTIVE:   BP 136/80   Pulse 73   Temp 98.9 F (37.2 C) (Oral)   Ht 5\' 2"  (1.575 m)   SpO2 96%   BMI 37.46 kg/m    GEN: pleasant, well developed in no acute distress  HENT: moist mucus membranes, no oral lesions, no exudates, no erythema,  NECK: no cervical lymphadenopathy  CV: regular rate and rhythm RESP: no increased work of breathing, clear to ascultation bilaterally with no crackles, wheezes, or rhonchi  SKIN: warm, dry     ASSESSMENT/PLAN:   No problem-specific Assessment & Plan notes found for this encounter.   Viral Respiratory Illness with Cough History consistent with viral illness. Overall pt is well appearing, well hydrated, without respiratory distress. Discussed symptomatic treatment. COVID test pending. Pt to quarantine until test results, longer if needed.  - continue to monitor for fevers  - continue Tylenol/ Motrin as needed for discomfort - nasal saline to help with nasal congestion - Stressed hydration - Flovent sent to pharmacy  - Work note offered by pt declined  - Discussed ED precautions, understanding voiced   [name], DO PGY-3, [facility] 05/04/2021

## 2021-05-04 NOTE — Patient Instructions (Signed)
It was great seeing [name] today!  Be sure to quartine until your test returns tomorrow or longer if positive.  You likely has a common respiratory virus.    Recommend:  - Tylenol, or Ibuprofen for fever or irritability, if needed.  - Avoid over the counter cough medicine.   - Honey at bedtime, for cough - Humidifier in room at as needed / at bedtime  - Use saline spray throughout the day to help clear secretions.  - Increase fluid intake as it is important    If you have questions or concerns please do not hesitate to call at [phone].  Dr. [facility]

## 2021-05-04 NOTE — Telephone Encounter (Signed)
**  After Hours/ Emergency Line Call**  Received a call to report that [name] requesting a physician prescribe Flovent rather than her other inhaler to [facility] on [address]. She states that the current inhaler is not covered and was going to be $200.  I informed her that as I had not performed a physical exam on her that I would not be able to prescribe it at this time but that I would send a message to the physician that saw her today to determine if it was appropriate.  She denies shortness of breath or chest pains but she would like this sent by tomorrow if possible.  Will forward to ordering provider.  Lurline Del, DO PGY-3, [facility] 05/04/2021 5:16 PM

## 2021-05-04 NOTE — Telephone Encounter (Signed)
Patient calls nurse line requesting change from budesonide inhaler to flovent inhaler. Flovent is covered by [employer].   Please advise if change is appropriate. If so, please send in new rx to [facility] on [address].   Talbot Grumbling, RN

## 2021-05-05 LAB — NOVEL CORONAVIRUS, NAA: SARS-CoV-2, NAA: NOT DETECTED

## 2021-05-05 LAB — SARS-COV-2, NAA 2 DAY TAT

## 2021-05-06 ENCOUNTER — Other Ambulatory Visit: Payer: Self-pay | Admitting: Family Medicine

## 2021-05-06 MED ORDER — FLUTICASONE PROPIONATE HFA 110 MCG/ACT IN AERO: 1.0000 | INHALATION_SPRAY | Freq: Every day | RESPIRATORY_TRACT | 12 refills | Status: AC | PRN

## 2021-05-26 NOTE — Progress Notes (Signed)
Video connection was lost at (<50%) of the duration of the visit, at which time the remainder of the visit was completed via audio only.

## 2021-07-04 ENCOUNTER — Other Ambulatory Visit: Payer: Self-pay | Admitting: Family Medicine

## 2021-07-04 DIAGNOSIS — M792 Neuralgia and neuritis, unspecified: Secondary | ICD-10-CM

## 2021-07-26 ENCOUNTER — Ambulatory Visit: Payer: Medicare Other | Admitting: Neurology

## 2021-08-08 ENCOUNTER — Other Ambulatory Visit: Payer: Self-pay | Admitting: Family Medicine

## 2021-08-08 DIAGNOSIS — M792 Neuralgia and neuritis, unspecified: Secondary | ICD-10-CM

## 2021-08-25 ENCOUNTER — Other Ambulatory Visit: Payer: Self-pay

## 2021-08-25 ENCOUNTER — Ambulatory Visit (INDEPENDENT_AMBULATORY_CARE_PROVIDER_SITE_OTHER): Payer: Medicare Other

## 2021-08-25 ENCOUNTER — Ambulatory Visit (INDEPENDENT_AMBULATORY_CARE_PROVIDER_SITE_OTHER): Payer: Medicare Other | Admitting: Family Medicine

## 2021-08-25 ENCOUNTER — Encounter: Payer: Self-pay | Admitting: Family Medicine

## 2021-08-25 VITALS — BP 134/69 | HR 64 | Ht 62.0 in | Wt 205.6 lb

## 2021-08-25 DIAGNOSIS — Z23 Encounter for immunization: Secondary | ICD-10-CM | POA: Diagnosis not present

## 2021-08-25 DIAGNOSIS — R42 Dizziness and giddiness: Secondary | ICD-10-CM | POA: Diagnosis not present

## 2021-08-25 DIAGNOSIS — Z6837 Body mass index (BMI) 37.0-37.9, adult: Secondary | ICD-10-CM

## 2021-08-25 DIAGNOSIS — W19XXXA Unspecified fall, initial encounter: Secondary | ICD-10-CM

## 2021-08-25 DIAGNOSIS — R55 Syncope and collapse: Secondary | ICD-10-CM

## 2021-08-25 DIAGNOSIS — R413 Other amnesia: Secondary | ICD-10-CM

## 2021-08-25 NOTE — Patient Instructions (Signed)
I do not know what has been causing these dizziness spells with falls.  Since they all have occurred with you standing, I will ask you heart doctors if testing you for a postural hypotension with a tilt table test is appropriate.    We are checking your blood for anemia today.   We will get back in touch with you by MyChart with test results and my talk with your cardiologists.

## 2021-08-26 ENCOUNTER — Encounter: Payer: Self-pay | Admitting: Family Medicine

## 2021-08-26 DIAGNOSIS — W19XXXA Unspecified fall, initial encounter: Secondary | ICD-10-CM | POA: Insufficient documentation

## 2021-08-26 DIAGNOSIS — R42 Dizziness and giddiness: Secondary | ICD-10-CM | POA: Insufficient documentation

## 2021-08-26 LAB — CBC
Hematocrit: 36.3 % (ref 34.0–46.6)
Hemoglobin: 12.2 g/dL (ref 11.1–15.9)
MCH: 28 pg (ref 26.6–33.0)
MCHC: 33.6 g/dL (ref 31.5–35.7)
MCV: 83 fL (ref 79–97)
Platelets: 306 10*3/uL (ref 150–450)
RBC: 4.36 x10E6/uL (ref 3.77–5.28)
RDW: 11.6 % — ABNORMAL LOW (ref 11.7–15.4)
WBC: 6.8 10*3/uL (ref 3.4–10.8)

## 2021-08-26 NOTE — Assessment & Plan Note (Signed)
MMSE - Mini Mental State Exam 01/13/2021 10/12/2020  Orientation to time 3 5  Orientation to Place 5 5  Registration 3 3  Attention/ Calculation 2 2  Recall 3 3  Language- name 2 objects 2 2  Language- repeat 1 1  Language- follow 3 step command 3 3  Language- read & follow direction 1 1  Write a sentence 1 1  Copy design 1 1  Total score 25 27   Ms Paszkiewicz is able to perform all her iADLs and ADLs.   Her Memory decline is primarily age-related memory decline. It can be reassess with MoCA in a year or two

## 2021-08-26 NOTE — Progress Notes (Signed)
[name] is alone Sources of clinical information for visit is/are patient and past medical records. Nursing assessment for this office visit was reviewed with the patient for accuracy and revision.     Previous Report(s) Reviewed: radiology reports and neurology & cardiology consult notes  Depression screen [facility] 2/9 08/25/2021  Decreased Interest 0  Down, Depressed, Hopeless 0  PHQ - 2 Score 0  Altered sleeping 0  Tired, decreased energy 0  Change in appetite 0  Feeling bad or failure about yourself  0  Trouble concentrating 0  Moving slowly or fidgety/restless 0  Suicidal thoughts 0  PHQ-9 Score 0  Difficult doing work/chores -  Some recent data might be hidden   New Site Visit from 08/25/2021 in [facility] Office Visit from 05/04/2021 in [facility] Office Visit from 12/23/2020 in [facility]  Thoughts that you would be better off dead, or of hurting yourself in some way Not at all Not at all Not at all  PHQ-9 Total Score 0 1 0       Fall Risk  08/25/2021 12/23/2020 09/17/2020 09/02/2020 07/06/2020  Falls in the past year? 1 0 0 0 0  Number falls in past yr: 0 0 0 0 0  Injury with Fall? 0 0 0 - 0    PHQ9 SCORE ONLY 08/25/2021 05/04/2021 12/23/2020  PHQ-9 Total Score 0 1 0    Adult vaccines due  Topic Date Due   TETANUS/TDAP  09/19/2021    Health Maintenance Due  Topic Date Due   Zoster Vaccines- Shingrix (1 of 2) Never done   PAP SMEAR-Modifier  05/28/2021      History/P.E. limitations: none  Adult vaccines due  Topic Date Due   TETANUS/TDAP  09/19/2021   There are no preventive care reminders to display for this patient.  Health Maintenance Due  Topic Date Due   Zoster Vaccines- Shingrix (1 of 2) Never done   PAP SMEAR-Modifier  05/28/2021     Chief Complaint  Patient presents with   Memory Loss   Fall    CPT E&M Office Visit Time Before Visit; reviewing medical  records (e.g. recent visits, labs, studies): 10 minutes During Visit (F2F time): 20 minutes After Visit (discussion with family or HCP, prescribing, ordering, referring, calling result/recommendations or documenting on same day): 5 minutes Total Visit Time: 35 minutes  Level 2 Est: 10-19 min     New: 15-29 min Level 3 Est: 20-29 min     New: 30-44 min Level 4 Est: 30-39 min     New: 45-59 min Level 5 Est: 40-54 min     New: 60-74 min   > Level 5 - see prolonged service CPT E&M codes; 99XXX

## 2021-08-26 NOTE — Assessment & Plan Note (Addendum)
Fall Risk  08/25/2021 12/23/2020 09/17/2020 09/02/2020 07/06/2020  Falls in the past year? 1 0 0 0 0  Number falls in past yr: 0 0 0 0 0  Injury with Fall? 0 0 0 - 0    FALL Number of falls in last year: 1  Occurred yesterday Location: front yard on grass Activity prior to fall: Stepping to side away from a leaf blower.  With the step her head felt "spacey."  She thinks she sensed she was falling, then came out of the spacey feeling feeling cold as she was lying on the ground.  She fell forward. Based on a report from her husband, it sounds like she was  on the ground for less than a minute or two. She got up onher own. She did not injury herself. She did not seek care at that time.   She does not think she passed out, but she is unsure.  There was not vertigo sensation with the event.  No prodrome.  No post-ictal confusion.  No urinary incontinence.   She does not believe she tripped or slipped or lose her balance.  She denies illness at the time of the event.  No new medications or changes in medications. No new OTC medications.  No alcohol.   Had ETT with Dr Quay Burow in 07/1915 with hypotensive/blunted BP response. Subsequent Cardiac Nuclear Scan was low risk CAD.  On 07/24/19, she had a similar event at home where she passed out while talking to someone.  She struck head against a door.  There was no prodrome.  No post-ictal sxs, no incontinence. Head CT soon after the event was unremarkable.     09/08/19 Dr Adora Fridge (Card) evaluated her for this syncopal event.  His work-up showed a normal transthoracic echocardiogram, a normal 30 day loop telemetry including a patient report of a syncopal episode with no  episode of corresponding abnormal rhythm.   02/19/20 [facility] referred patient to Neurology  04/09/20 Dr Jannifer Franklin (Neuro) evaluation of syncopal events along with about a year of intermittent feeling of "head spaciness". No history of seizures. Brain MRi and EEG were  unremarkable.   10/12/20 Neuro (Nurse Practicioner) eval of memory difficulty complaint.  Patient continued to complain of intermittent  "spacey in the head" sensation.   Physical Exam  VS orthostatics within normal limits Cor: RRR, no AS murmur Neuro Gait: Full stride, no swing past midline, no sway with 180 turning, no significant antalgic stance phase  Stance: Normal sidebyside, semitandem, and tandem  balance Cerebellar testing: Rhomberg negative, F-N bil. normal Strength 5/5 UE/LE bil symmetric  Lab: CBC:    Component Value Date/Time   WBC 6.8 08/25/2021 1025   WBC 13.2 (H) 10/03/2013 0644   HGB 12.2 08/25/2021 1025   HCT 36.3 08/25/2021 1025   PLT 306 08/25/2021 1025   MCV 83 08/25/2021 1025   NEUTROABS 3.5 09/30/2013 1354   LYMPHSABS 4.0 09/30/2013 1354   MONOABS 0.5 09/30/2013 1354   EOSABS 0.3 09/30/2013 1354   BASOSABS 0.1 09/30/2013 1354      A/ Falls or collapse Vasovagal / Neurogenic syncope Dysrhythmia Drug / Medications: CCB, BB Panic attack Seizure/pseudoseizure, conversion Carotid sinus sensitivity  A/ The recurrence of these similar events is concerning to Ms Arscott.   The two causes I would be most interested in excluding are cardiogenic events/neurocardiogenic evnets and seizures.  P/ Referral to [facility] who have seen Ms Arrants before for a similar complaint.   Will  plan to refer back to neurology if cardiology opinion is that events are unlikely cardiac in origin.  Ms Palermo is already aware that she should not be driving with recurrence of these events.

## 2021-09-05 ENCOUNTER — Telehealth: Payer: Self-pay | Admitting: Family Medicine

## 2021-09-05 DIAGNOSIS — R42 Dizziness and giddiness: Secondary | ICD-10-CM

## 2021-09-05 DIAGNOSIS — R29818 Other symptoms and signs involving the nervous system: Secondary | ICD-10-CM

## 2021-09-05 NOTE — Telephone Encounter (Signed)
I called Ms [name] to recommend an evaluation of her transient events by cardiac electrophysiology specialty. Ms [name] agreed to the referral.  A referral was placed to Dr Virl Axe at [facility] for evaluation and treatment.

## 2021-10-12 ENCOUNTER — Ambulatory Visit: Payer: Medicare Other

## 2021-10-12 ENCOUNTER — Ambulatory Visit: Payer: Medicare Other | Admitting: Family Medicine

## 2021-10-14 ENCOUNTER — Other Ambulatory Visit: Payer: Self-pay

## 2021-10-14 ENCOUNTER — Ambulatory Visit (INDEPENDENT_AMBULATORY_CARE_PROVIDER_SITE_OTHER): Payer: Medicare Other | Admitting: Family Medicine

## 2021-10-14 VITALS — BP 143/84 | HR 84 | Ht 62.0 in | Wt 202.0 lb

## 2021-10-14 DIAGNOSIS — G44221 Chronic tension-type headache, intractable: Secondary | ICD-10-CM | POA: Diagnosis not present

## 2021-10-14 DIAGNOSIS — Z87898 Personal history of other specified conditions: Secondary | ICD-10-CM | POA: Diagnosis not present

## 2021-10-14 NOTE — Progress Notes (Addendum)
° ° °  SUBJECTIVE:   CHIEF COMPLAINT / HPI:   Patient presents today primarily for discussion of her headaches.  Headaches have been ongoing for over 2 years, occurring about 3 times a week and last a few hours.  She will occasionally take ibuprofen which will relieve the headache.  Headaches are not associated with nausea, vomiting.  Denies any recent head injuries.  Denies any recent falls since her last office visit 1 month ago. She states she had a revelation while in the waiting room and believes her headaches may be associated with the stress that she has been dealing with and not taking care of herself.  Last seen in office 1 month ago after a fall.  As she stepped away from a leaf blower, her head felt "spacey".  Do not think she passed out at that time.  No associated vertigo sensation, prodrome, postictal confusion, or urinary incontinence.  Syncopal episodes appears to be a recurrent issue.  See chart for further details.  Work-up in the past few years have included TTE, 30 Day Loop telemetry (which did capture a patient reported syncopal episode), brain MRI, EEG which have all been unremarkable.  She was referred to EP specialist, Dr. Caryl Comes at last visit and has an upcoming appointment next week on 1/5.  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  Her brother has since passed away but she is still spending a lot of time taking care of her significant other.  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  PERTINENT  PMH / PSH: HTN, postural dizziness with near syncope, obesity,  OBJECTIVE:   BP (!) 143/84    Pulse 84    Ht 5\' 2"  (1.575 m)    Wt 202 lb (91.6 kg)    SpO2 100%    BMI 36.95 kg/m   General: Alert, NAD CV: RRR, no murmurs Pulm: CTAB, no wheezes or rales Neuro: Alert and interactive, CN II  through XII intact, full strength in all extremities  Depression screen [facility] 2/9 10/14/2021  Decreased Interest 0  Down, Depressed, Hopeless 0  PHQ - 2 Score 0  Altered sleeping 0  Tired, decreased energy 0  Change in appetite 0  Feeling bad or failure about yourself  -  Trouble concentrating -  Moving slowly or fidgety/restless -  Suicidal thoughts -  PHQ-9 Score 0  Difficult doing work/chores -  Some recent data might be hidden     ASSESSMENT/PLAN:   Headaches Likely tension headaches.  Appear to be mild, well controlled with occasional ibuprofen.  Neuro exam unremarkable.  Possibly stress related, therapy resources given.  Patient will also work on self-care.   Reminded patient of appointment with Dr. Caryl Comes, EP specialist  Zola Button, [place]

## 2021-10-14 NOTE — Patient Instructions (Addendum)
It was nice seeing you today!  Please give [place] a call or go to the ED if you have any further episodes of passing out.  Make sure you keep your appointment with the heart doctor.  Please arrive at least 15 minutes prior to your scheduled appointments.  Stay well, Zola Button, MD [place] [phone]    Therapy and Counseling Resources Most providers on this list will take Medicaid. Patients with commercial insurance or Medicare should contact their insurance company to get a list of in network providers.  BestDay:Psychiatry and Counseling [address] [place], [place] [address] [place], [place], [place] [address]      [place] [address]  [place], [place] [address] [phone]  [place] [address], [place],  [address]       [phone]     [facility] (virtual only) [phone]  Jinny Blossom [facility] [address] [place], [place]  Family Solutions:  [place] [phone]  [facility]:  [address] [place] [phone]  [facility] (under & uninsured) [address] [place] [phone]    [email or web address]    [place] [address]  [place]    [phone]  [facility]     Phone:  [phone]     [place]  [place] [address]      [place], [place] [address] [place]: [place], [place], [place]   [place] ([place] therapist) [email or web address]  [place] [address]   [place] [address]    [phone]    [facility]   [address],  [place], [place]   [place]  [place]  [facility]  [address] [place], [place]        [phone]  Open Access/Walk In Clinic under & uninsured  [facility]  [address] [place], [place] Crisis [phone]  [facility] the [place],  ([place])   [place], [place]: [phone]) 8:30 - 12; 1 - 2:30  Family Service of the [place],  [place], [place]    ([phone]):8:30 - 12; 2 - 3PM  [facility] [employer],  [address],  [place]; [phone]):   Mon - Fri 8 AM - 5 PM  [facility] [employer]  MWF 12:30 to 3:00 or call to schedule an appointment  [phone]  Specific Provider options Psychology Today  [email or web address] click on find a therapist  enter your zip code left side and select or tailor a therapist for your specific need.   [facility] Provider Directory [email or web address]  (Medicaid)   Follow all drop down to find a provider  [place] or [email or web address] [address], [place], [place] Recovery support and educational   24- Hour Availability:   [facility]  [address] [place], [place] Crisis [phone]  Family  Service of the [employer] [place]  [phone]   [place]  [phone] (after hours)  Therapeutic Alternative/Mobile Crisis   [phone]  [place] National Suicide Hotline  [phone] [place])  Call 911 or go to emergency room  [facility]  [phone]);  [place] and [employer]  [phone]); [place], [place], [place], [place], [place],  [place], [place]

## 2021-10-18 ENCOUNTER — Other Ambulatory Visit: Payer: Self-pay | Admitting: Family Medicine

## 2021-10-18 NOTE — Progress Notes (Addendum)
° ° °  SUBJECTIVE:   CHIEF COMPLAINT / HPI: Anxiety/Depression  Anxiety/Depression PHQ-9 of 5 today. Did mark several days on question 9 however no active SI, just passive. No prior attempts or plans. Per chart review has previously been on sertraline, fluoxetine, and duloxetine. She says she overall feels like she does not feel like herself. She has been using "calm drops" but was interested in restarting sertraline which she has tried in the past. No specific trigger for her mood but ahs had some family member losses in the past. Still deciding on whether she is interested in therapy/counseling today but showed some interest.  HTN 170/101 in office today. Some headaches at night that she says are throbbing. Didn't take medications this morning and does not take BP at home but has cuff. Denies any headache right now, no shortness of breath or issues in office.   PERTINENT  PMH / PSH: morbid obesity, low TSH  OBJECTIVE:   BP (!) 170/101    Pulse 80    Ht 5\' 2"  (1.575 m)    Wt 204 lb (92.5 kg)    SpO2 99%    BMI 37.31 kg/m   General: NAD, awake, alert, responsive to questions Head: Normocephalic atraumatic Respiratory:  chest rises symmetrically,  no increased work of breathing Extremities: Moves upper and lower extremities freely, no edema in LE Neuro: No focal deficits  ASSESSMENT/PLAN:   Depressed mood PHQ-9 of 5 today. Did mark several days on question 9 however no active SI/plans -Sertraline 25 mg daily, f/u in 4 weeks for mood -Provided therapy/counseling/suicidality resources in [place]  Essential hypertension BP was 170/101 today. No headache or issues in office today but patient has not taken medications. -BP check daily with logs for next visit in 2 weeks, may need increase in BP medications if adhering -Return precautions discussed for HTN   Gerrit Heck, MD [place]

## 2021-10-18 NOTE — Patient Instructions (Signed)
It was great to see you! Thank you for allowing me to participate in your care!   I recommend that you always bring your medications to each appointment as this makes it easy to ensure we are on the correct medications and helps [place] not miss when refills are needed.  Our plans for today:  - I have sent in low dose zoloft for you to take daily, we will follow up on your mood after 4 weeks - Please take your blood pressure medications as prescribed. Please take your blood pressure daily and keep a log for a next visit in 2 weeks as we may need to adjust it. If having any headaches with vision changes, shortness of breaht or chest pain please go to the ED.   Therapy and Counseling Resources Most providers on this list will take Medicaid. Patients with commercial insurance or Medicare should contact their insurance company to get a list of in network providers.  [facility]:Psychiatry and Counseling [address] [facility] [place]. [place], [place] [address] [place], [place], [place] [address]      [place] [address]  [place], [place] [address] [phone]  [place] [address] [place]  [place], [place] [address]       [phone]     [facility] (virtual only) [phone]  [facility]inny Bl[facility] [address] [place], [place]  Family [facility]:  [place]. [place] [phone]  [facility]:  [place] [address] [place] [phone]  [facility] (under & uninsured) [address] [place] [phone]    [email or web address]    [place] [address]  [place]    [phone]  [facility] [place]     Phone:  [phone]     [facility]  [place] [address]      [place], [place] ext [place]: [place], [place], [place]   [place] ([place] therapist) [email or web address]  [place] [address]   [place] [address]    [phone]    [facility]   [address],  [place], [place]   [place]  [place]  [facility]  [address] [place], [place]        [phone]  Open Access/Walk In Clinic under & uninsured  [facility]  [address] [place], [place] Crisis [phone]  [facility] of the [place],  ([place])   [place], [place]: [phone]) 8:30 - 12; 1 - 2:30  [facility] of the [place],  [place], [place]    [phone]):8:30 - 12; 2 - 3PM  [facility] [employer],  [address]  [place]; [phone]):   Mon - Fri 8 AM - 5 PM  [facility] [place]  MWF 12:30 to 3:00 or call to schedule an appointment  [phone]  Specific Provider options Psychology Today  [email or web address] click on find a therapist  enter your zip code left side and select or tailor a therapist  for your specific need.   [facility] Provider Directory [email or web address]  (Medicaid)   Follow all drop down to find a provider  [place] or [email or web address] [address], [place], [place] Recovery support and educational   24- Hour Availability:   [facility]  [address] [place], [place] Crisis [phone]  [facility] of the [employer] [phone]  [place]  [phone]   [place]   [phone] (after hours)  Therapeutic Alternative/Mobile Crisis   [phone]  [place] National Suicide Hotline  [phone] Diamantina Monks)  Call 911 or go to emergency room  [facility]  [phone]);  [place] and [employer]  [phone]); [place], [place], [place], [place], [place], [place], [place]    Take care and seek immediate care sooner if you develop any concerns. Please remember to show up 15 minutes before your scheduled appointment time!  Gerrit Heck, MD [place]

## 2021-10-19 ENCOUNTER — Other Ambulatory Visit: Payer: Self-pay

## 2021-10-19 ENCOUNTER — Ambulatory Visit (INDEPENDENT_AMBULATORY_CARE_PROVIDER_SITE_OTHER): Payer: Medicare Other | Admitting: Student

## 2021-10-19 ENCOUNTER — Encounter: Payer: Self-pay | Admitting: Student

## 2021-10-19 VITALS — BP 170/101 | HR 80 | Ht 62.0 in | Wt 204.0 lb

## 2021-10-19 DIAGNOSIS — I10 Essential (primary) hypertension: Secondary | ICD-10-CM

## 2021-10-19 DIAGNOSIS — R4589 Other symptoms and signs involving emotional state: Secondary | ICD-10-CM | POA: Insufficient documentation

## 2021-10-19 MED ORDER — SERTRALINE HCL 25 MG PO TABS: 25.0000 mg | ORAL_TABLET | Freq: Every day | ORAL | 0 refills | Status: DC

## 2021-10-19 NOTE — Assessment & Plan Note (Signed)
PHQ-9 of 5 today. Did mark several days on question 9 however no active SI/plans -Sertraline 25 mg daily, f/u in 4 weeks for mood -Provided therapy/counseling/suicidality resources in [place]

## 2021-10-19 NOTE — Assessment & Plan Note (Signed)
BP was 170/101 today. No headache or issues in office today but patient has not taken medications. -BP check daily with logs for next visit in 2 weeks, may need increase in BP medications if adhering -Return precautions discussed for HTN

## 2021-10-20 ENCOUNTER — Ambulatory Visit: Payer: Medicare Other | Admitting: Internal Medicine

## 2021-10-20 ENCOUNTER — Encounter: Payer: Self-pay | Admitting: Internal Medicine

## 2021-10-20 VITALS — BP 160/96 | HR 91 | Ht 62.0 in | Wt 204.8 lb

## 2021-10-20 DIAGNOSIS — R42 Dizziness and giddiness: Secondary | ICD-10-CM

## 2021-10-20 DIAGNOSIS — R55 Syncope and collapse: Secondary | ICD-10-CM | POA: Diagnosis not present

## 2021-10-20 NOTE — Progress Notes (Signed)
ELECTROPHYSIOLOGY CONSULT NOTE  Patient ID: [name], MRN: [id number], DOB/AGE: [birth year] 65 y.o. Admit date: (Not on file) Date of Consult: 10/20/2021  Primary Physician: Sharion Settler, DO Primary Cardiologist: Leather Estis is a 66 y.o. female who is being seen today for the evaluation of recurrent syncope-like events at the request of Dr. Sherren Mocha McDiarmid MD.    HPI [name] is a 66 y.o. female is seen today with 2 complaints.  The first are intermittent  flutters for 5 years, and they last only a couple of seconds. Her flutters happen infrequently maybe once or twice a month.  Anxiety can make them more likely.  They are disconcerting more than they are problematic.    She also has had falls.  There have been 3.  They are not associated with a recognizable prodrome.  They are not associated with recovery symptoms nor with residual orthostatic intolerance.  On  1 occasion she was aware of and may be on more than one occasion she is aware of herself falling  On the second occasion she was wearing event recorder where sinus rhythm was documented throughout with a mild prolongation of the PP interval.   She says that she has trouble remembering certain things. She is unable to remember new information.   Some dyspnea on exertion but no chest pain.  No peripheral edema.  No orthopnea . When she takes groceries in she tries to bring all her groceries in before closing the door.   Her systolic pressure was 585 or 170 today.  She reports she does not smoke.      DATE TEST EF    10/16 Myoview   57%     12/20 Echo  55-60 % Mildly increased LVH.               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   Hyperlipidemia    Hypertension    Normal cardiac stress test    Myoview stress test   OAB (overactive bladder) 09/01/2015   Osteopenia 05/2018   T score -1.1 FRAX 2.5%/ 0.1%   Subclinical hyperthyroidism 05/08/2007   Qualifier: Diagnosis of  By: Jerline Pain MD, Anderson [place]        Surgical History:  Past Surgical History:  Procedure Laterality Date   COLONOSCOPY     POLYPECTOMY     TOTAL HIP ARTHROPLASTY Left 10/01/2013   DR Mayer Camel   TOTAL HIP ARTHROPLASTY Left 10/01/2013   Procedure: TOTAL HIP ARTHROPLASTY;  Surgeon: Kerin Salen, MD;  Location: [facility];  Service: Orthopedics;  Laterality: Left;     Home Meds: Current Meds  Medication Sig   albuterol (PROVENTIL HFA) 108 (90 Base) MCG/ACT inhaler INHALE TWO PUFFS BY MOUTH INTO THE LUNGS EVERY 6 HOURS AS NEEDED FOR WHEEZING OR SHORTNESS OF BREATH   amLODipine (NORVASC) 10 MG  tablet TAKE 1 TABLET BY MOUTH ONCE DAILY IN THE MORNING   Budesonide 90 MCG/ACT inhaler Inhale 1 puff into the lungs 2 (two) times daily.   capsaicin (ZOSTRIX) 0.025 % cream Apply topically 2 (two) times daily.   cholecalciferol (VITAMIN D3) 25 MCG (1000 UNIT) tablet Take 1,000 Units by mouth daily.   fluticasone (FLONASE) 50 MCG/ACT nasal spray USE ONE SPRAY(S) IN EACH NOSTRIL TWICE DAILY (Patient taking differently: Place 1 spray into both nostrils in the morning and at bedtime.)   fluticasone (FLOVENT HFA) 110 MCG/ACT inhaler Inhale 1 puff into the lungs daily as needed.   gabapentin (NEURONTIN) 100 MG capsule Take 1 capsule by mouth once daily   loratadine (EQ ALLERGY RELIEF) 10 MG tablet Take 1 tablet (10 mg total) by mouth daily.   metoprolol tartrate (LOPRESSOR) 50 MG tablet Take 1 tablet (50 mg total) by mouth 2 (two) times daily.   rosuvastatin (CRESTOR) 5 MG tablet TAKE ONE TABLET BY MOUTH EVERY MOUTH MONDAY AND  FRIDAY   sertraline (ZOLOFT) 25 MG tablet Take 1 tablet (25 mg total) by mouth daily.    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    Patient has 3 children and has grandchildren.    Patient enjoys reading, spending time with her family, and very involved in her church.    Social Determinants of Health   Financial Resource Strain: Not on file  Food Insecurity: Not on file  Transportation Needs: Not on file  Physical Activity: Not on file  Stress: Not on file  Social Connections: Not on file  Intimate Partner Violence: Not on file     Family History  Problem Relation Age of Onset   Hypertension Mother    Diabetes Father    Hyperlipidemia Brother    Breast cancer Paternal Grandmother    Heart attack Neg Hx    Sudden death Neg Hx    Colon cancer Neg Hx    Hypercalcemia Neg Hx      ROS:  Please see the history of present illness.     All other systems reviewed and negative.   Please see the history of present illness. (+)  Syncope (+) All other systems are reviewed and negative.     Physical Exam: Blood pressure (!) 160/96, pulse 91, height 5\' 2"  (1.575 m), weight 204 lb 12.8 oz (92.9 kg),  SpO2 96 %. General: Well developed, well nourished female in no acute distress. Head: Normocephalic, atraumatic, sclera non-icteric, no xanthomas, nares are without discharge. EENT: normal  Lymph Nodes:  none Neck: Negative for carotid bruits. JVD not elevated. Back:without scoliosis kyphosis Lungs: Clear bilaterally to auscultation without wheezes, rales, or rhonchi. Breathing is unlabored. Heart: RRR with S1 S2. No /6 systolic murmur . No rubs, or gallops appreciated. Abdomen: Soft, non-tender, non-distended with normoactive bowel sounds. No hepatomegaly. No rebound/guarding. No obvious abdominal masses. Msk:  Strength and tone appear normal for age. Extremities: No clubbing or cyanosis. No+ edema.  Distal pedal pulses are 2+ and equal bilaterally. Skin: Warm and Dry Neuro: Alert and oriented X 3. CN III-XII intact Grossly normal sensory and motor function . Psych:  Responds to questions appropriately with a normal affect.        10/20/2021 ECG:  sinus 91 15/08/34   Assessment and Plan: \\ Falls  Hypertension  Flutters  Sleep disordered breathing   The patient has had 3 falls.  I am not sure that the are syncope.  Normally syncope is defined by loss of consciousness which should be dissociated from a sense of falling which she notes was present.  It makes me wonder whether the mechanism is altogether different from cardiovascular syncope which is associate with loss of cerebral perfusion loss of cognitive function and then collapse.  Moreover, the lack of prodrome or recovery symptoms would suggest that a triggered neuro reflex was also not part of the system and we know from the event recorder that was done in primary arrhythmia.  I suggested that she follow-up with her primary care physician for further  elucidation perhaps from neurology.  Blood pressure is poorly controlled.  She has not tolerated hydrochlorothiazide in the past I wonder whether she might not be a candidate for spironolactone.  I will defer this to her follow-up visits with her primary care physician as we would not be following her going forward  She has sleep disordered breathing daytime somnolence and think she would benefit from a sleep study.  Will defer this also to primary care   F/u  prn    I,Tinashe Williams,acting as a scribe for Virl Axe, MD.,have documented all relevant documentation on the behalf of Virl Axe, MD,as directed by  Virl Axe, MD while in the presence of Virl Axe, MD.   I, Virl Axe, MD, have reviewed all documentation for this visit. The documentation on 10/20/21 for the exam, diagnosis, procedures, and orders are all accurate and complete.

## 2021-10-20 NOTE — Progress Notes (Signed)
error 

## 2021-10-20 NOTE — Patient Instructions (Addendum)

## 2021-10-25 ENCOUNTER — Other Ambulatory Visit: Payer: Self-pay | Admitting: Family Medicine

## 2021-10-25 DIAGNOSIS — M792 Neuralgia and neuritis, unspecified: Secondary | ICD-10-CM
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
## 2021-11-08 ENCOUNTER — Ambulatory Visit (INDEPENDENT_AMBULATORY_CARE_PROVIDER_SITE_OTHER): Payer: Medicare Other | Admitting: Family Medicine

## 2021-11-08 ENCOUNTER — Encounter: Payer: Self-pay | Admitting: Family Medicine

## 2021-11-08 ENCOUNTER — Other Ambulatory Visit: Payer: Self-pay

## 2021-11-08 VITALS — BP 135/88 | HR 72 | Ht 62.0 in | Wt 201.2 lb

## 2021-11-08 DIAGNOSIS — R0683 Snoring: Secondary | ICD-10-CM

## 2021-11-08 DIAGNOSIS — I10 Essential (primary) hypertension: Secondary | ICD-10-CM | POA: Diagnosis not present

## 2021-11-08 DIAGNOSIS — R413 Other amnesia: Secondary | ICD-10-CM

## 2021-11-08 NOTE — Patient Instructions (Addendum)
We are placing referral for our [place] clinic who also does memory evaluations.  This will give [place] an idea if there truly is any memory issue or not.  For your high blood pressure, headaches and concern for sleep apnea I think we do need to do a sleep study.  I placed this referral and they should reach out to you in the next couple weeks to get it set up.  I do not think we need to change blood pressure medications right now but if you have a future appointment with a high blood pressure I think we should start extra medications.  I also recommend you schedule follow-up appoint with your primary care physician in the next 1 to 2 months once the sleep study has been done.

## 2021-11-14 ENCOUNTER — Other Ambulatory Visit: Payer: Self-pay | Admitting: Family Medicine

## 2021-11-17 ENCOUNTER — Other Ambulatory Visit: Payer: Self-pay | Admitting: Family Medicine

## 2021-11-18 NOTE — Telephone Encounter (Signed)
Patient calls nurse line to report that she has found her amlodipine. Patient requests that we disregard refill request at this time.   Talbot Grumbling, RN

## 2021-11-25 ENCOUNTER — Other Ambulatory Visit: Payer: Self-pay | Admitting: Family Medicine

## 2021-11-25 DIAGNOSIS — M792 Neuralgia and neuritis, unspecified: Secondary | ICD-10-CM

## 2021-11-30 ENCOUNTER — Ambulatory Visit (INDEPENDENT_AMBULATORY_CARE_PROVIDER_SITE_OTHER): Payer: Medicare Other | Admitting: Family Medicine

## 2021-11-30 ENCOUNTER — Other Ambulatory Visit: Payer: Self-pay

## 2021-11-30 DIAGNOSIS — R7989 Other specified abnormal findings of blood chemistry: Secondary | ICD-10-CM

## 2021-11-30 DIAGNOSIS — R1900 Intra-abdominal and pelvic swelling, mass and lump, unspecified site: Secondary | ICD-10-CM

## 2021-11-30 DIAGNOSIS — R413 Other amnesia: Secondary | ICD-10-CM | POA: Diagnosis not present

## 2021-11-30 NOTE — Assessment & Plan Note (Signed)
This seems to be progressive and causing patient and family distress.  [facility] has no open appts so recommend follow up with PCP with a family member present.  Has not had CMET or TSH recently and has history of calcium disorder and fluctuating thyroid levels.   Suggested to patient and family to use a pill box for her medications

## 2021-11-30 NOTE — Assessment & Plan Note (Signed)
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  Recommend to patient and daughter to follow this over time but to let us know if any pain or worsening

## 2021-11-30 NOTE — Progress Notes (Signed)
° ° °  SUBJECTIVE:   CHIEF COMPLAINT / HPI:   Mass on R flank Felt it not sure how long ago.  Mildly tender.  No nausea and vomiting diarrhea or pain elsewhere  Memory Reported did not bring her medications - later in visit remembered she did have them.  Does not recall last time she took them  She feels her memory is causing her and her family some problems   While in room patient called her daughter [name] with whom I discussed the above and her recent referrals to [facility] and Sleep study.  Daughter requested all calls go to patients other daughter [name] [phone]  PERTINENT  PMH / PSH: hyperparathyroid, depression, hypertension, memory   OBJECTIVE:   BP (!) 156/92    Pulse 74    Wt 200 lb 6.4 oz (90.9 kg)    SpO2 100%    BMI 36.65 kg/m   Alert cooperative Does not know her medications.  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  [facility] has no open appts so recommend follow up with PCP with a family member present.  Has not had CMET or TSH recently and has history of calcium disorder and fluctuating thyroid levels.   Suggested to patient and family to use a pill box for her medications   Hypercalcemia Seems asymptomatic but will check calcium level.       Lind Covert, MD [place]

## 2021-11-30 NOTE — Assessment & Plan Note (Signed)
Seems asymptomatic but will check calcium level.

## 2021-11-30 NOTE — Patient Instructions (Addendum)
Good to see you today - Thank you for coming in  Things we discussed today:  Medications Should use a pill box and family should check it every day  They will call your daughter Seth Bake [phone] To schedule sleep study and geriatric clinic   Please always bring your medication bottles to every visit   Front Desk - Make her an appointment with Dr Nita Sells in 2-3 weeks  - have her fill out a form to give [place] permission to talk with her dtrs Steffanie Dunn and Seth Bake   Thanks!

## 2021-12-01 LAB — CMP14+EGFR
ALT: 28 IU/L (ref 0–32)
AST: 20 IU/L (ref 0–40)
Albumin/Globulin Ratio: 1.6 (ref 1.2–2.2)
Albumin: 4.2 g/dL (ref 3.8–4.8)
Alkaline Phosphatase: 141 IU/L — ABNORMAL HIGH (ref 44–121)
BUN/Creatinine Ratio: 8 — ABNORMAL LOW (ref 12–28)
BUN: 6 mg/dL — ABNORMAL LOW (ref 8–27)
Bilirubin Total: 0.2 mg/dL (ref 0.0–1.2)
CO2: 24 mmol/L (ref 20–29)
Calcium: 10.2 mg/dL (ref 8.7–10.3)
Chloride: 107 mmol/L — ABNORMAL HIGH (ref 96–106)
Creatinine, Ser: 0.8 mg/dL (ref 0.57–1.00)
Globulin, Total: 2.7 g/dL (ref 1.5–4.5)
Glucose: 84 mg/dL (ref 70–99)
Potassium: 4 mmol/L (ref 3.5–5.2)
Sodium: 144 mmol/L (ref 134–144)
Total Protein: 6.9 g/dL (ref 6.0–8.5)
eGFR: 82 mL/min/{1.73_m2} (ref >59)

## 2021-12-01 LAB — TSH: TSH: 0.28 u[IU]/mL — ABNORMAL LOW (ref 0.450–4.500)

## 2021-12-01 NOTE — Assessment & Plan Note (Signed)
Mildly low TSH.  Will see if can order T4 on current blood sample

## 2021-12-01 NOTE — Addendum Note (Signed)
Addended by: Talbert Cage L on: 12/01/2021 08:42 AM   Modules accepted: Orders

## 2021-12-02 LAB — SPECIMEN STATUS REPORT

## 2021-12-02 LAB — T4, FREE: Free T4: 1.03 ng/dL (ref 0.82–1.77)

## 2021-12-08 ENCOUNTER — Other Ambulatory Visit: Payer: Self-pay | Admitting: Cardiology

## 2021-12-10 ENCOUNTER — Other Ambulatory Visit: Payer: Self-pay | Admitting: Cardiology

## 2021-12-10 ENCOUNTER — Other Ambulatory Visit: Payer: Self-pay | Admitting: Family Medicine

## 2021-12-10 ENCOUNTER — Other Ambulatory Visit: Payer: Self-pay | Admitting: Student

## 2021-12-10 DIAGNOSIS — R4589 Other symptoms and signs involving emotional state: Secondary | ICD-10-CM

## 2021-12-10 DIAGNOSIS — M792 Neuralgia and neuritis, unspecified: Secondary | ICD-10-CM

## 2021-12-13 NOTE — Patient Instructions (Addendum)
It was wonderful to see you today. ? ?Today we talked about: ? ?-We did a little memory test today, which was actually pretty good!  ?-Your appointment with our geriatrics team is on April 27th at 37PM.  ?-Keep using the pill organizer! ?-You have been referred to have a sleep study done. It appears they were unable to contact your daughter Seth Bake- please call the number below for an appointment.  ?Address: [address], [place], [place] [address] ?Phone: [phone] ? ?Thank you for choosing [facility].  ? ?Please call [phone] with any questions about today's appointment. ? ?Please be sure to schedule follow up at the front  desk before you leave today.  ? ?Sharion Settler, DO ?PGY-2 Family Medicine   ?

## 2021-12-13 NOTE — Progress Notes (Signed)
? ? ?SUBJECTIVE:  ? ?CHIEF COMPLAINT / HPI:  ? ?Memory Concerns  Depressed Mood ?[name] is a 66 y.o. female who presents to the [facility] clinic for memory concerns. She was recently seen on 2/15 by Dr. Erin Hearing for similar concerns.  Blood work notable for persistently elevated alkaline phosphatase 141, TSH 0.280, free T4 within normal limits. She was also seen on 1/24 and reported similar concerns.  She was referred to our geriatrics clinic for more thorough evaluation. She is scheduled for evaluation on 4/27. Patient was previously referred to endocrinology for her persistent hypercalcemia.  She saw Dr. Loanne Drilling on 04/06/2021. Lab work performed at that time showed continued elevation in alkaline phosphatase. PTH normal and calcium was normal, Vitamin D was slightly low.   ? ?Recently, she also suffered from the loss of a loved one. She has had depressed mood and elevated PHQ-9 screening and restarted back on Sertraline 25 mg. She takes it daily (but only taking half a piece) and without adverse effects. "I feel like I'm at piece".  ? ?Returns with her son today. He reports not having any concerns regarding her memory.  She he feels that this is mostly age-related and as expected.  He feels that for 76 years old his mother is quite sharp and her capacity is great.  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  States that he would not be concerned unless she started to forget Bible verses.  She lives alone but son notes that his step dad comes to help occasionally. Since last visit she did buy a pill Chief Executive Officer. She denies doubling up on any doses, if there is ever a question about whether she took the medication or not, she just does not take it.  ? ?Hypertension ?Takes Norvasc 10 mg, Lopressor 50 mg BID. Has not tolerated HCTZ in the past 2/2 syncope and also has history of hypercalcemia for which she saw endocrinology. She has been referred for a sleep study- has not  yet been scheduled. She does have a home BP cuff, only checks BP intermittently.  ? ?PERTINENT  PMH / PSH:  ?Hyperparathyroidism, subclinical hyperthyroidism, HLD, HTN ? ?OBJECTIVE:  ? ?BP (!) 158/83   Pulse 73   Wt 200 lb 6.4 oz (90.9 kg)   SpO2 99%   BMI 36.65 kg/m?   ? ?General: NAD, pleasant, able to participate in exam ?Neuro: alert, no obvious focal deficits, answers questions appropriately though sometimes takes longer to answer ?Psych: Normal affect and mood, speech is slightly circumferential ? ?Depression screen [facility] 2/9 12/16/2021 11/08/2021 10/19/2021  ?Decreased Interest 0 1 1  ?Down, Depressed, Hopeless 0 1 1  ?PHQ - 2 Score 0 2 2  ?Altered sleeping 0 0 0  ?Tired, decreased energy 0 0 1  ?Change in appetite 0 1 1  ?Feeling bad or failure about yourself  0 0 0  ?Trouble concentrating 0 0 0  ?Moving slowly or fidgety/restless 0 0 0  ?Suicidal thoughts 0 0 1  ?PHQ-9 Score 0 3 5  ?Difficult doing work/chores - - Not difficult at all  ?Some recent data might be hidden  ? ? ? ? ?ASSESSMENT/PLAN:  ? 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 Patient is scheduled in geriatric clinic in April. At this point seems that she is safe at home and any memory issues are not putting her in danger. Will continue to monitor for now. Could consider  checking a free T3 at follow up given slightly abnormal TSH. She was previously worked up for her hypercalcemia by Endocrinologist and results were reassuring. ? ?Essential hypertension ?BP elevated today and at previous visit as well. Asymptomatic. Appears compliant on current regimen. ?-Add Losartan 25 mg  ?-Record home BP ?-Return in 2 weeks, BMP at that time ? ?Depressed mood ?Appears improved today. Only taking 12.5 mg of Sertraline. PHQ-9 today 0.  ?-Continue regimen ?-Monitor mood ?  ? ? ?Sharion Settler, DO ?[place]  ? ?

## 2021-12-16 ENCOUNTER — Encounter: Payer: Self-pay | Admitting: Family Medicine

## 2021-12-16 ENCOUNTER — Ambulatory Visit (INDEPENDENT_AMBULATORY_CARE_PROVIDER_SITE_OTHER): Payer: Medicare Other | Admitting: Family Medicine

## 2021-12-16 ENCOUNTER — Other Ambulatory Visit: Payer: Self-pay

## 2021-12-16 DIAGNOSIS — I10 Essential (primary) hypertension: Secondary | ICD-10-CM

## 2021-12-16 DIAGNOSIS — R413 Other amnesia: Secondary | ICD-10-CM | POA: Diagnosis not present

## 2021-12-16 DIAGNOSIS — R4589 Other symptoms and signs involving emotional state: Secondary | ICD-10-CM

## 2021-12-16 MED ORDER — LOSARTAN POTASSIUM 25 MG PO TABS: 25.0000 mg | ORAL_TABLET | Freq: Every day | ORAL | 3 refills | Status: DC

## 2021-12-16 NOTE — Assessment & Plan Note (Signed)
MoCA today 26/30, which is reassuring. Patient is scheduled in geriatric clinic in April. At this point seems that she is safe at home and any memory issues are not putting her in danger. Will continue to monitor for now. Could consider checking a free T3 at follow up given slightly abnormal TSH. She was previously worked up for her hypercalcemia by Endocrinologist and results were reassuring. ?

## 2021-12-16 NOTE — Assessment & Plan Note (Addendum)
BP elevated today and at previous visit as well. Asymptomatic. Appears compliant on current regimen. ?-Add Losartan 25 mg  ?-Record home BP ?-Return in 2 weeks, BMP at that time ?

## 2021-12-16 NOTE — Assessment & Plan Note (Signed)
Appears improved today. Only taking 12.5 mg of Sertraline. PHQ-9 today 0.  ?-Continue regimen ?-Monitor mood ?

## 2021-12-19 ENCOUNTER — Other Ambulatory Visit: Payer: Self-pay

## 2021-12-19 ENCOUNTER — Encounter: Payer: Self-pay | Admitting: Primary Care

## 2021-12-19 ENCOUNTER — Ambulatory Visit (INDEPENDENT_AMBULATORY_CARE_PROVIDER_SITE_OTHER): Payer: Medicare Other | Admitting: Primary Care

## 2021-12-19 VITALS — BP 134/76 | HR 76 | Temp 98.5°F | Ht 62.0 in | Wt 200.4 lb

## 2021-12-19 DIAGNOSIS — R0683 Snoring: Secondary | ICD-10-CM | POA: Diagnosis not present

## 2021-12-19 NOTE — Patient Instructions (Addendum)
Sleep apnea is defined as period of 10 seconds or longer when you stop breathing at night. This can happen multiple times a night. Dx sleep apnea is when this occurs more than 5 times an hour.  ?  ?Mild OSA 5-15 apneic events an hour ?Moderate OSA 15-30 apneic events an hour ?Severe OSA > 30 apneic events an hour ?  ?Untreated sleep apnea puts you at higher risk for cardiac arrhythmias, pulmonary HTN, stroke and diabetes ?  ?Treatment options include weight loss, side sleeping position, oral appliance, CPAP therapy or referral to ENT for possible surgical options  ?  ?Recommendations: ?Focus on side sleeping position ?Work on weight loss efforts  ?Do not drive if experiencing excessive daytime sleepiness of fatigue  ?  ?Orders: ?Home sleep study re: snoring (ordered) ?  ?Follow-up: ?4-6 week visit to review sleep study results and discuss treatment options further (virtual visit ok) ? ?

## 2021-12-19 NOTE — Progress Notes (Signed)
? ?'@Patient'$  ID: [name], female    DOB: Aug 03, 1956, 66 y.o.   MRN: [id number] ? ?Chief Complaint  ?Patient presents with  ? sleep consult   ?  Sleep concult. Never had a sleep study done before. Pt reports snoring, not feeling fully energized, etc  ? ? ?Referring provider: ?McDiarmid, Blane Ohara, MD ? ?HPI:  ?66 year old female, never smoked.  Past medical history significant for hypertension, thyromegaly, hyperparathyroidism, hyperlipidemia, postural dizziness, age-related memory impairment, depressed mood, obesity. ? ?12/19/2021 ?Presents today for sleep consult, referred by family medicine due to snoring. She has symptoms of loud snoring, interrupted sleep ,daytime somnolence and sleep disordered breathing. Recommended by cardiology to be evaluated for OSA d.t postural dizziness. She goes to bed around 8-9pm. It takes her on average 20 minutes to fall asleep. She wakes up 1-2 times a night. Starts her day between 7-8am. 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 She has never had a asleep study before or been on CPAP. No oxygen use. Epworth 8. Denies cataplexy, narcolepsy, sleep walking.  ? ? ?Sleep questionnaire ?Symptoms- Loud snoring, restless sleep, daytime sleepiness ?Prior sleep study- None ?Bedtime- 8-9pm ?Time to fall asleep- 20 mins  ?Nocturnal awakenings- 1-2 times  ?Out of bed/start of day- 6-7am  ?Weight changes- None  ?Do you operate heavy machinery- No ?Do you currently wear CPAP- No ?Do you current wear oxygen- No ?Epworth- 8 ? ? ?Allergies  ?Allergen Reactions  ? Hctz [Hydrochlorothiazide] Other (See Comments)  ?  Stopped after syncopal spell- unclear if it was related  ? Hydrocodone   ?  Mood disturbance  ? Simvastatin   ?  REACTION: muscle aches, GI complaints  ? ? ?Immunization History  ?Administered Date(s) Administered  ? Influenza Split 08/09/2012  ? Influenza Whole 11/09/2008, 11/30/2009, 09/19/2010  ? Influenza,inj,Quad PF,6+ Mos 08/21/2014, 07/16/2015, 08/20/2017, 08/02/2018, 07/02/2019,  07/06/2020, 08/25/2021  ? PFIZER(Purple Top)SARS-COV-2 Vaccination 04/28/2020, 05/19/2020  ? PPD Test 08/09/2012, 08/13/2012  ? Pension scheme manager 10yr & up 08/25/2021  ? Td 10/16/2000  ? Tdap 09/20/2011  ? ? ?Past Medical History:  ?Diagnosis Date  ? Allergy   ? Anxiety   ? Arthritis   ? 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 Atypical chest pain 05/03/2015  ? Low risk Myoview 2016, normal LVF by echo Dec 2020  ? Carpal tunnel syndrome, bilateral   ? Chest pain   ? Elevated PTHrP level 06/19/2018  ? Syncope  ? Feeling grief 12/30/2015  ? Heart murmur   ? age 89said had heart murmur.  ? Hyperlipidemia   ? Hypertension   ? Normal cardiac stress test   ? Myoview stress test  ? OAB (overactive bladder) 09/01/2015  ? Osteopenia 05/2018  ? T score -1.1 FRAX 2.5%/ 0.1%  ? Subclinical hyperthyroidism 05/08/2007  ? Qualifier: Diagnosis of  By: PJerline PainMD, JAnderson Malta   ? ? ?Tobacco History: ?Social History  ? ?Tobacco Use  ?Smoking Status Never  ?Smokeless Tobacco Never  ? ?Counseling given: Not Answered ? ? ?Outpatient Medications Prior to Visit  ?Medication Sig Dispense Refill  ? albuterol (PROVENTIL HFA) 108 (90 Base) MCG/ACT inhaler INHALE TWO PUFFS BY MOUTH INTO THE LUNGS EVERY 6 HOURS AS NEEDED FOR WHEEZING OR SHORTNESS OF BREATH 18 g 2  ? amLODipine (NORVASC) 10 MG tablet TAKE 1 TABLET BY MOUTH ONCE DAILY IN THE MORNING 90 tablet 0  ? Budesonide 90 MCG/ACT inhaler Inhale 1 puff into the lungs 2 (two) times daily. 1 each 0  ?  capsaicin (ZOSTRIX) 0.025 % cream Apply topically 2 (two) times daily. 60 g 0  ? cholecalciferol (VITAMIN D3) 25 MCG (1000 UNIT) tablet Take 1,000 Units by mouth daily.    ? fluticasone (FLONASE) 50 MCG/ACT nasal spray USE ONE SPRAY(S) IN EACH NOSTRIL TWICE DAILY (Patient taking differently: Place 1 spray into both nostrils in the morning and at bedtime.) 16 g 6  ? fluticasone (FLOVENT HFA) 110 MCG/ACT inhaler Inhale 1 puff into the lungs daily as needed. 1 each 12  ? [START ON  12/23/2021] gabapentin (NEURONTIN) 100 MG capsule Take 1 capsule by mouth once daily 30 capsule 0  ? loratadine (EQ ALLERGY RELIEF) 10 MG tablet Take 1 tablet (10 mg total) by mouth daily. 90 tablet 3  ? losartan (COZAAR) 25 MG tablet Take 1 tablet (25 mg total) by mouth at bedtime. 90 tablet 3  ? metoprolol tartrate (LOPRESSOR) 50 MG tablet Take 1 tablet by mouth twice daily 180 tablet 0  ? rosuvastatin (CRESTOR) 5 MG tablet TAKE ONE TABLET BY MOUTH EVERY MONDAY AND FRIDAY. 90 tablet 3  ? sertraline (ZOLOFT) 25 MG tablet Take 1 tablet by mouth once daily 30 tablet 0  ? ?No facility-administered medications prior to visit.  ? ? ?Review of Systems ? ?Review of Systems  ?Constitutional:  Positive for fatigue.  ?HENT: Negative.    ?Respiratory: Negative.    ?Psychiatric/Behavioral:  Positive for sleep disturbance.   ? ? ?Physical Exam ? ?BP 134/76 (BP Location: Left Arm, Patient Position: Sitting, Cuff Size: Normal)   Pulse 76   Temp 98.5 ?F (36.9 ?C) (Oral)   Ht '5\' 2"'$  (1.575 m)   Wt 200 lb 6.4 oz (90.9 kg)   SpO2 100%   BMI 36.65 kg/m?  ?Physical Exam ?Constitutional:   ?   Appearance: Normal appearance.  ?HENT:  ?   Head: Normocephalic and atraumatic.  ?   Mouth/Throat:  ?   Mouth: Mucous membranes are moist.  ?   Pharynx: Oropharynx is clear. Uvula midline.  ?   Tonsils: No tonsillar exudate or tonsillar abscesses. 3+ on the right. 3+ on the left.  ?Cardiovascular:  ?   Rate and Rhythm: Normal rate and regular rhythm.  ?Pulmonary:  ?   Effort: Pulmonary effort is normal.  ?   Breath sounds: Normal breath sounds.  ?Musculoskeletal:     ?   General: Normal range of motion.  ?Skin: ?   General: Skin is warm and dry.  ?Neurological:  ?   General: No focal deficit present.  ?   Mental Status: She is alert and oriented to person, place, and time. Mental status is at baseline.  ?Psychiatric:     ?   Mood and Affect: Mood normal.     ?   Behavior: Behavior normal.     ?   Thought Content: Thought content normal.     ?    Judgment: Judgment normal.  ?  ? ?Lab Results: ? ?CBC ?   ?Component Value Date/Time  ? WBC 6.8 08/25/2021 1025  ? WBC 13.2 (H) 10/03/2013 1572  ? RBC 4.36 08/25/2021 1025  ? RBC 4.10 10/03/2013 0644  ? HGB 12.2 08/25/2021 1025  ? HCT 36.3 08/25/2021 1025  ? PLT 306 08/25/2021 1025  ? MCV 83 08/25/2021 1025  ? MCH 28.0 08/25/2021 1025  ? MCH 27.1 10/03/2013 0644  ? MCHC 33.6 08/25/2021 1025  ? MCHC 32.1 10/03/2013 0644  ? RDW 11.6 (L) 08/25/2021 1025  ? LYMPHSABS 4.0  09/30/2013 1354  ? MONOABS 0.5 09/30/2013 1354  ? EOSABS 0.3 09/30/2013 1354  ? BASOSABS 0.1 09/30/2013 1354  ? ? ?BMET ?   ?Component Value Date/Time  ? NA 144 11/30/2021 1040  ? K 4.0 11/30/2021 1040  ? CL 107 (H) 11/30/2021 1040  ? CO2 24 11/30/2021 1040  ? GLUCOSE 84 11/30/2021 1040  ? GLUCOSE 86 05/03/2015 1559  ? BUN 6 (L) 11/30/2021 1040  ? CREATININE 0.80 11/30/2021 1040  ? CREATININE 0.75 05/03/2015 1559  ? CALCIUM 10.2 11/30/2021 1040  ? GFRNONAA 82 10/12/2020 0825  ? GFRNONAA >89 01/15/2014 1509  ? GFRAA 95 10/12/2020 0825  ? GFRAA >89 01/15/2014 1509  ? ? ?BNP ?No results found for: BNP ? ?ProBNP ?No results found for: PROBNP ? ?Imaging: ?No results found. ? ? ?Assessment & Plan:  ? ?Loud snoring ?- Patient has symptoms of loud snoring, interrupted sleep, daytime sleepiness. Referred by cardiology d.t postural dizziness and poorly controlled hypertension. Epworth 8. BMI 36.  Concern patient could have obstructive sleep apnea, needs home sleep study to evaluate. Discussed risk of untreated sleep apnea including cardiac arrhythmias, pulm HTN, stroke, DM. We briefly reviewed treatment options. She does have enlarged tonsils, may benefit from ENT eval if she has OSA. Encouraged patient to work on weight loss efforts and focus on side sleeping position/elevate head of bed. Advised against driving if experiencing excessive daytime sleepiness. Follow-up in 4-6 weeks to review sleep study results and discuss treatment options further.   ? ? ?Martyn Ehrich, NP ?12/19/2021 ? ?

## 2021-12-19 NOTE — Assessment & Plan Note (Addendum)
-   Patient has symptoms of loud snoring, interrupted sleep, daytime sleepiness. Referred by cardiology d.t postural dizziness and poorly controlled hypertension. Epworth 8. BMI 36.  Concern patient could have obstructive sleep apnea, needs home sleep study to evaluate. Discussed risk of untreated sleep apnea including cardiac arrhythmias, pulm HTN, stroke, DM. We briefly reviewed treatment options. She does have enlarged tonsils, may benefit from ENT eval if she has OSA. Encouraged patient to work on weight loss efforts and focus on side sleeping position/elevate head of bed. Advised against driving if experiencing excessive daytime sleepiness. Follow-up in 4-6 weeks to review sleep study results and discuss treatment options further.  ?

## 2021-12-20 NOTE — Progress Notes (Signed)
Reviewed and agree with assessment/plan. ? ? ?Talayla Doyel, MD ?[facility] ?12/20/2021, 8:55 AM ?Pager:  [phone] ? ?

## 2021-12-26 ENCOUNTER — Other Ambulatory Visit: Payer: Self-pay | Admitting: Family Medicine

## 2021-12-26 DIAGNOSIS — M792 Neuralgia and neuritis, unspecified: Secondary | ICD-10-CM

## 2021-12-26 NOTE — Progress Notes (Unsigned)
° ° °  SUBJECTIVE:   CHIEF COMPLAINT / HPI:   Blood Pressure F/U [name] is a 66 y.o. female who presents to the clinic today for follow up on her blood pressure. She was last seen in the clinic on 3/3 and had elevated BP to 158/83. Current medications include Norvasc 10 mg daily, Lopressor 50 mg BID, and Losartan 25 mg daily (added last visit). Reports good*** compliance. Home blood pressure readings range *** systolic and *** diastolic. Denies chest pain, shortness of breath, lower extremity edema, headaches, and vision changes.    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   Hyperlipidemia    Hypertension    Normal cardiac stress test    Myoview stress test   OAB (overactive bladder) 09/01/2015   Osteopenia 05/2018   T score -1.1 FRAX 2.5%/ 0.1%   Subclinical hyperthyroidism 05/08/2007   Qualifier: Diagnosis of  By: Jerline Pain MD, Anderson Malta       OBJECTIVE:   There were no vitals taken for this visit. ***  General: NAD, pleasant, able to participate in exam Cardiac: RRR, no murmurs. Respiratory: CTAB, normal effort, No wheezes, rales or rhonchi Extremities: no edema or cyanosis. Psych: Normal affect and mood  ASSESSMENT/PLAN:   No problem-specific Assessment & Plan notes found for this encounter.      [name], [name]

## 2021-12-26 NOTE — Patient Instructions (Signed)
It was wonderful to see you today.  Please bring ALL of your medications with you to every visit.   Today we talked about:  -Your blood pressure today was much improved today!   -Medication changes: None  -We are doing lab work today to check your electrolytes and kidney function. I will send you a MyChart message if you have MyChart. Otherwise, I will give you a call for abnormal results or send a letter if everything returned back normal. If you don't hear from me in 2 weeks, please call the office.   -You are overdue for a Pap smear. This is a screening test to check for signs of cervical cancer. Please schedule and appointment to return for this at your earliest convenience.    Thank you for choosing [facility].   Please call [phone] with any questions about today's appointment.  Please be sure to schedule follow up at the front  desk before you leave today.   Sabino Dick, DO PGY-2 Family Medicine

## 2021-12-29 ENCOUNTER — Other Ambulatory Visit: Payer: Self-pay

## 2021-12-29 ENCOUNTER — Ambulatory Visit (INDEPENDENT_AMBULATORY_CARE_PROVIDER_SITE_OTHER): Payer: Medicare Other | Admitting: Family Medicine

## 2021-12-29 VITALS — BP 124/65 | HR 65 | Ht 62.0 in | Wt 201.6 lb

## 2021-12-29 DIAGNOSIS — I10 Essential (primary) hypertension: Secondary | ICD-10-CM

## 2021-12-29 DIAGNOSIS — R413 Other amnesia: Secondary | ICD-10-CM | POA: Diagnosis not present

## 2021-12-29 NOTE — Assessment & Plan Note (Signed)
Daughter present today and still has concerns regarding her mother's memory.  Patient is scheduled in geriatrics clinic in April.  Discussed that MoCA done at last visit was not overly concerning but that this could be better evaluated in geriatrics clinic. ?

## 2021-12-29 NOTE — Assessment & Plan Note (Addendum)
BP much improved today. No signs of symptoms of hypotension, no syncope.  ?-BMP today ?-Continue current medications, no changes today ?-Discussed signs of hypotension; should hold anti-hypertensive and check BP if develops signs  ? ? ?

## 2021-12-30 LAB — BASIC METABOLIC PANEL
BUN/Creatinine Ratio: 9 — ABNORMAL LOW (ref 12–28)
BUN: 7 mg/dL — ABNORMAL LOW (ref 8–27)
CO2: 24 mmol/L (ref 20–29)
Calcium: 10.8 mg/dL — ABNORMAL HIGH (ref 8.7–10.3)
Chloride: 106 mmol/L (ref 96–106)
Creatinine, Ser: 0.78 mg/dL (ref 0.57–1.00)
Glucose: 92 mg/dL (ref 70–99)
Potassium: 4.1 mmol/L (ref 3.5–5.2)
Sodium: 141 mmol/L (ref 134–144)
eGFR: 85 mL/min/{1.73_m2} (ref >59)

## 2022-01-07 ENCOUNTER — Other Ambulatory Visit: Payer: Self-pay | Admitting: Family Medicine

## 2022-01-07 DIAGNOSIS — R4589 Other symptoms and signs involving emotional state: Secondary | ICD-10-CM

## 2022-01-14 NOTE — Patient Instructions (Addendum)
It was wonderful to see you today. ? ?Please bring ALL of your medications with you to every visit.  ? ?Today we talked about: ? ?-We performed a Pap smear today. I will let you know the results via MyChart. If normal, this will be your last Pap smear!  ?--We are checking for sexually transmitted infections including chlamydia, gonorrhea, trichomonas, HIV, syphilis and hepatitis B. I will let you know of the results via MyChart or telephone call. We are also checking for bacterial vaginosis and yeast, which are not sexually transmitted infections.  ?-You should abstain from sexual activity until we have the results. If your test is positive for a sexually transmitted infection, it is important that both you and your partner are both treated.  ?-It is always important to use barrier protection, such as condoms, to help prevent sexually transmitted infections.   ?-Your blood pressure was just mildly elevated today. I am okay with this reading for now- since you have passed out in the past and this was thought to maybe be related to lower pressures. No medication changes for now.  ? ? ?Thank you for choosing [place].  ? ?Please call [phone] with any questions about today's appointment. ? ?Please be sure to schedule follow up at the front  desk before you leave today.  ? ?Sharion Settler, DO ?PGY-2 Family Medicine   ?

## 2022-01-14 NOTE — Progress Notes (Signed)
? ? ?  SUBJECTIVE:  ? ?CHIEF COMPLAINT / HPI:  ? ?Pap Smear ?[name] is a 66 y.o. female who presents to the [facility] clinic today for Pap smear. Denies any vaginal bleeding. Does feel that she has some abnormal "kinda dusty" discharge. No abnormal odor. Amenable to testing for STI. Last Pap smear in 2018 showed NILM.  ? ?Hypertension ?Current medications include Norvasc 10 mg daily, Lopressor 50 mg BID, and Losartan 25 mg daily. Reports good medication compliance without adverse effects.  ? ?PERTINENT  PMH / PSH:  ?Past Medical History:  ?Diagnosis Date  ? Allergy   ? Anxiety   ? Arthritis   ? 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 Atypical chest pain 05/03/2015  ? Low risk Myoview 2016, normal LVF by echo Dec 2020  ? Carpal tunnel syndrome, bilateral   ? Chest pain   ? Elevated PTHrP level 06/19/2018  ? Syncope  ? Feeling grief 12/30/2015  ? Heart murmur   ? age 56 said had heart murmur.  ? Hyperlipidemia   ? Hypertension   ? Normal cardiac stress test   ? Myoview stress test  ? OAB (overactive bladder) 09/01/2015  ? Osteopenia 05/2018  ? T score -1.1 FRAX 2.5%/ 0.1%  ? Subclinical hyperthyroidism 05/08/2007  ? Qualifier: Diagnosis of  By: Jerline Pain MD, Anderson Malta    ? ? ? ?OBJECTIVE:  ? ?BP (!) 144/83   Pulse 73   Wt 201 lb 6.4 oz (91.4 kg)   SpO2 99%   BMI 36.84 kg/m?   ?Vitals:  ? 01/24/22 1357 01/24/22 1411  ?BP: (!) 144/91 (!) 144/83  ?Pulse: 73   ?SpO2: 99%   ? ? ?General: NAD, pleasant, able to participate in exam ?Respiratory: Normal respiratory effort ?GU: Normal appearance of labia majora and minora, without lesions. Vagina tissue pink, moist, without lesions or abrasions. Cervix normal appearance, non-friable, with only scant discharge from os.  ?Psych: Normal affect and mood ? ?GU exam chaperoned by Delray Alt CMA  ? ?ASSESSMENT/PLAN:  ? ?Pap smear for cervical cancer screening ?Overdue for Pap smear. Collected today with HPV co-testing. If normal, will likely be patients last Pap. No reported history of  abnormal Pap smears and last Pap in 2018 was normal. ? ?Vaginal discharge ?Reports some abnormal discharge. Would like to be tested for STI. Examination with only scant clear discharge from cervical os.  ?-Testing for chlamydia, gonorrhea, trichomonas ?-Wet prep negative ?-Opted for blood testing to assess for HIV, syphilis and Hepatitis B ?-F/u results and treat as indicated ? ? ?Essential hypertension ?Currently taking Norvasc 10 mg, Lopressor 50 mg twice daily, losartan 25 mg daily.  Blood pressure today was mildly elevated on both checks. Given her history of dizziness and syncope, thought to potentially be related to orthostasis, will avoid any additional agents at this time.  ?-Continue current BP medications ? ?  ? ? ?Sharion Settler, DO ?[place]  ? ?

## 2022-01-17 ENCOUNTER — Other Ambulatory Visit: Payer: Self-pay | Admitting: Cardiology

## 2022-01-24 ENCOUNTER — Other Ambulatory Visit: Payer: Self-pay

## 2022-01-24 ENCOUNTER — Encounter: Payer: Self-pay | Admitting: Family Medicine

## 2022-01-24 ENCOUNTER — Other Ambulatory Visit (HOSPITAL_COMMUNITY)
Admission: RE | Admit: 2022-01-24 | Discharge: 2022-01-24 | Disposition: A | Discharge status: Home / Self Care | Payer: Medicare Other | Source: Ambulatory Visit | Attending: Family Medicine | Admitting: Family Medicine

## 2022-01-24 ENCOUNTER — Ambulatory Visit (INDEPENDENT_AMBULATORY_CARE_PROVIDER_SITE_OTHER): Payer: Medicare Other | Admitting: Family Medicine

## 2022-01-24 VITALS — BP 144/83 | HR 73 | Wt 201.4 lb

## 2022-01-24 DIAGNOSIS — Z1151 Encounter for screening for human papillomavirus (HPV): Secondary | ICD-10-CM | POA: Insufficient documentation

## 2022-01-24 DIAGNOSIS — Z124 Encounter for screening for malignant neoplasm of cervix: Secondary | ICD-10-CM | POA: Diagnosis not present

## 2022-01-24 DIAGNOSIS — I10 Essential (primary) hypertension: Secondary | ICD-10-CM | POA: Diagnosis not present

## 2022-01-24 DIAGNOSIS — Z113 Encounter for screening for infections with a predominantly sexual mode of transmission: Secondary | ICD-10-CM

## 2022-01-24 DIAGNOSIS — N898 Other specified noninflammatory disorders of vagina: Secondary | ICD-10-CM

## 2022-01-24 DIAGNOSIS — Z01419 Encounter for gynecological examination (general) (routine) without abnormal findings: Secondary | ICD-10-CM | POA: Diagnosis present

## 2022-01-24 LAB — POCT WET PREP (WET MOUNT)
Clue Cells Wet Prep Whiff POC: NEGATIVE
Trichomonas Wet Prep HPF POC: ABSENT

## 2022-01-24 NOTE — Assessment & Plan Note (Addendum)
Reports some abnormal discharge. Would like to be tested for STI. Examination with only scant clear discharge from cervical os.  ?-Testing for chlamydia, gonorrhea, trichomonas ?-Wet prep negative ?-Opted for blood testing to assess for HIV, syphilis and Hepatitis B ?-F/u results and treat as indicated ? ?

## 2022-01-24 NOTE — Assessment & Plan Note (Signed)
Overdue for Pap smear. Collected today with HPV co-testing. If normal, will likely be patients last Pap. No reported history of abnormal Pap smears and last Pap in 2018 was normal. ?

## 2022-01-24 NOTE — Assessment & Plan Note (Signed)
Currently taking Norvasc 10 mg, Lopressor 50 mg twice daily, losartan 25 mg daily.  Blood pressure today was mildly elevated on both checks. Given her history of dizziness and syncope, thought to potentially be related to orthostasis, will avoid any additional agents at this time.  ?-Continue current BP medications ?

## 2022-01-25 LAB — CYTOLOGY - PAP
Chlamydia: NEGATIVE
Comment: NEGATIVE
Comment: NEGATIVE
Comment: NEGATIVE
Comment: NORMAL
Diagnosis: NEGATIVE
High risk HPV: NEGATIVE
Neisseria Gonorrhea: NEGATIVE
Trichomonas: NEGATIVE

## 2022-01-25 LAB — HIV ANTIBODY (ROUTINE TESTING W REFLEX): HIV Screen 4th Generation wRfx: NONREACTIVE

## 2022-01-25 LAB — RPR: RPR Ser Ql: NONREACTIVE

## 2022-01-25 LAB — HEPATITIS B SURFACE ANTIGEN: Hepatitis B Surface Ag: NEGATIVE

## 2022-02-09 ENCOUNTER — Ambulatory Visit (INDEPENDENT_AMBULATORY_CARE_PROVIDER_SITE_OTHER): Payer: Medicare Other | Admitting: Family Medicine

## 2022-02-09 DIAGNOSIS — R413 Other amnesia: Secondary | ICD-10-CM
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
## 2022-02-10 ENCOUNTER — Encounter: Payer: Self-pay | Admitting: Family Medicine

## 2022-02-10 NOTE — Progress Notes (Signed)
I have interviewed and examined the patient.  I have discussed the case with Dr. Janus Molder.   I agree with their documentation and management in their consultation note.   ? ?

## 2022-02-10 NOTE — Patient Instructions (Signed)

## 2022-02-10 NOTE — Assessment & Plan Note (Signed)
Established problem ?MoCA version 8.3 24/30 today, it was 25/30 using MoCA version 8.2 on 01/13/22.  ?Her MoCA in Dec. 2021 was 27/30 with a 6CIT a month before of zero  ? ?I repeated the evaluation for cognition and ADL functioning today with the conclusion similar to Dr Nita Sells, the patient has age-related cognitive decline. ? ?We discussed the lifestyle behaviors associated with slowing of memory decline in memory, including dietary, exercise and social activity.   ? ?It would be reasonable to periodically reassess [name]'s cognition and iADL functioning to see if she is showing signs of a progressive decline of a condition more serious than that of just age-related memory changes.  ?

## 2022-02-14 ENCOUNTER — Ambulatory Visit: Payer: Medicare Other

## 2022-02-14 DIAGNOSIS — R0683 Snoring: Secondary | ICD-10-CM

## 2022-02-15 ENCOUNTER — Encounter: Payer: Self-pay | Admitting: Primary Care

## 2022-02-15 ENCOUNTER — Ambulatory Visit: Payer: Medicare Other | Admitting: Family Medicine

## 2022-03-21 ENCOUNTER — Encounter: Payer: Self-pay | Admitting: *Deleted

## 2022-04-12 ENCOUNTER — Telehealth: Payer: Self-pay

## 2022-04-12 NOTE — Telephone Encounter (Signed)
Patient calls nurse line reporting abdominal pain.   Patient reports symptoms started today and described pain as "period pains." Patient reports a dull ache 3/10.  Patient denies any vaginal bleeding or spotting when she wipes. Patient denies any nausea vomiting or diarrhea.   Patient advised to take ibuprofen or tylenol to help with symptoms.   Patient offered an apt, however she would like to wait and see if symptoms resolve.   Patient advised to schedule an apt if any vaginal spotting or bleeding occurs.

## 2022-05-01 ENCOUNTER — Ambulatory Visit (INDEPENDENT_AMBULATORY_CARE_PROVIDER_SITE_OTHER): Payer: Medicare Other | Admitting: Student

## 2022-05-01 ENCOUNTER — Other Ambulatory Visit: Payer: Self-pay | Admitting: Family Medicine

## 2022-05-01 VITALS — BP 134/82 | HR 70 | Ht 62.0 in | Wt 201.0 lb

## 2022-05-01 DIAGNOSIS — B349 Viral infection, unspecified: Secondary | ICD-10-CM | POA: Insufficient documentation

## 2022-05-01 DIAGNOSIS — H6980 Other specified disorders of Eustachian tube, unspecified ear: Secondary | ICD-10-CM

## 2022-05-01 NOTE — Patient Instructions (Signed)
Ms. Wilhoite, I am sorry that you are feeling yucky.  I think that all of your symptoms are from a viral infection.  This should get better on its own.  I recommend that you focus on rest and to recovery at home.  The most important thing that you can do this to keep up your fluid intake.  You may drink what ever you would like, Gatorade, Pedialyte, water are all good choices.  If you notice that you are persistently having fevers for greater than 4 or 5 days, please come back to see [place].  Especially if you notice that your breathing is changing or getting worse, that would also be a good reason to come and see [place].  You may take ibuprofen and Tylenol as needed for fever or body aches. Pearla Dubonnet, MD

## 2022-05-01 NOTE — Assessment & Plan Note (Signed)
Patient presents with symptoms and clinical exam consistent with viral infection. Respiratory distress was not noted on exam. Patient remained clinically stabile at time of discharge. Supportive care without antibiotics is indicated at this time. Patient advised to have medical re-evaluation if symptoms worsen or persist, or if new symptoms develop, over the next 24-48 hours. Patient expressed understanding of these instructions.

## 2022-05-01 NOTE — Progress Notes (Signed)
    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 She is also endorsing some fatigue, feels like she is only "about 75%" herself. Denies any fever, sore throat, shortness of breath, rash, or mouth pain/lesions. Has been able to eat and drink without N/V/D.    OBJECTIVE:   BP 134/82   Pulse 70   Ht '5\' 2"'$  (1.575 m)   Wt 201 lb (91.2 kg)   SpO2 97%   BMI 36.76 kg/m   Physical Exam Vitals reviewed.  HENT:     Head:     Comments: MMM, mild pharyngeal erythema without exudate or tonsillar hypertrophy Crusted rhinorrhea in bilateral nares Cardiovascular:     Rate and Rhythm: Normal rate and regular rhythm.     Heart sounds: No murmur heard. Pulmonary:     Effort: Pulmonary effort is normal.     Breath sounds: Normal breath sounds. No wheezing, rhonchi or rales.  Neurological:     General: No focal deficit present.     Mental Status: She is alert.      ASSESSMENT/PLAN:   Viral syndrome Patient presents with symptoms and clinical exam consistent with viral infection. Respiratory distress was not noted on exam. Patient remained clinically stabile at time of discharge. Supportive care without antibiotics is indicated at this time. Patient advised to have medical re-evaluation if symptoms worsen or persist, or if new symptoms develop, over the next 24-48 hours. Patient expressed understanding of these instructions.     Pearla Dubonnet, MD [place]

## 2022-05-01 NOTE — Telephone Encounter (Signed)
Patient calls nurse line requesting refill on Flonase. Patient reports that she forgot to ask Dr. Joelyn Oms to send this in at appointment this morning.   Will forward to Dr. Joelyn Oms.   Talbot Grumbling, RN

## 2022-05-12 ENCOUNTER — Other Ambulatory Visit: Payer: Self-pay

## 2022-05-12 DIAGNOSIS — R4589 Other symptoms and signs involving emotional state: Secondary | ICD-10-CM

## 2022-05-12 DIAGNOSIS — M792 Neuralgia and neuritis, unspecified: Secondary | ICD-10-CM

## 2022-05-13 MED ORDER — AMLODIPINE BESYLATE 10 MG PO TABS: 10.0000 mg | ORAL_TABLET | Freq: Every morning | ORAL | 3 refills | Status: DC

## 2022-05-13 MED ORDER — SERTRALINE HCL 25 MG PO TABS: 25.0000 mg | ORAL_TABLET | Freq: Every day | ORAL | 11 refills | Status: DC

## 2022-05-13 MED ORDER — METOPROLOL TARTRATE 50 MG PO TABS: 50.0000 mg | ORAL_TABLET | Freq: Two times a day (BID) | ORAL | 0 refills | Status: DC

## 2022-05-13 MED ORDER — VITAMIN D 25 MCG (1000 UNIT) PO TABS: 1000.0000 [IU] | ORAL_TABLET | Freq: Every day | ORAL | 0 refills | Status: AC

## 2022-05-13 MED ORDER — LOSARTAN POTASSIUM 25 MG PO TABS: 25.0000 mg | ORAL_TABLET | Freq: Every day | ORAL | 3 refills | Status: DC

## 2022-05-13 MED ORDER — ROSUVASTATIN CALCIUM 5 MG PO TABS: ORAL_TABLET | ORAL | 1 refills | Status: DC

## 2022-05-13 MED ORDER — GABAPENTIN 100 MG PO CAPS: 100.0000 mg | ORAL_CAPSULE | Freq: Every day | ORAL | 3 refills | Status: DC

## 2022-05-16 ENCOUNTER — Other Ambulatory Visit: Payer: Self-pay

## 2022-05-16 DIAGNOSIS — H6980 Other specified disorders of Eustachian tube, unspecified ear: Secondary | ICD-10-CM

## 2022-05-16 MED ORDER — FLUTICASONE PROPIONATE 50 MCG/ACT NA SUSP: 1.0000 | Freq: Every day | NASAL | 0 refills | Status: DC

## 2022-07-15 ENCOUNTER — Other Ambulatory Visit: Payer: Self-pay | Admitting: Family Medicine

## 2022-07-26 ENCOUNTER — Other Ambulatory Visit: Payer: Self-pay | Admitting: Family Medicine

## 2022-07-31 ENCOUNTER — Other Ambulatory Visit: Payer: Self-pay | Admitting: Family Medicine

## 2022-07-31 DIAGNOSIS — M792 Neuralgia and neuritis, unspecified: Secondary | ICD-10-CM

## 2022-08-01 ENCOUNTER — Encounter: Payer: Self-pay | Admitting: Student

## 2022-08-01 ENCOUNTER — Ambulatory Visit (INDEPENDENT_AMBULATORY_CARE_PROVIDER_SITE_OTHER): Payer: Medicare Other | Admitting: Student

## 2022-08-01 VITALS — BP 110/70 | HR 73 | Wt 205.2 lb

## 2022-08-01 DIAGNOSIS — M1610 Unilateral primary osteoarthritis, unspecified hip: Secondary | ICD-10-CM

## 2022-08-01 DIAGNOSIS — Z23 Encounter for immunization: Secondary | ICD-10-CM

## 2022-08-01 DIAGNOSIS — M1612 Unilateral primary osteoarthritis, left hip: Secondary | ICD-10-CM

## 2022-08-01 DIAGNOSIS — R300 Dysuria: Secondary | ICD-10-CM

## 2022-08-01 DIAGNOSIS — R350 Frequency of micturition: Secondary | ICD-10-CM | POA: Diagnosis not present

## 2022-08-01 LAB — POCT URINALYSIS DIP (MANUAL ENTRY)
Bilirubin, UA: NEGATIVE
Blood, UA: NEGATIVE
Glucose, UA: NEGATIVE mg/dL
Ketones, POC UA: NEGATIVE mg/dL
Leukocytes, UA: NEGATIVE
Nitrite, UA: NEGATIVE
Protein Ur, POC: NEGATIVE mg/dL
Spec Grav, UA: >=1.03 — AB (ref 1.010–1.025)
Urobilinogen, UA: 0.2 E.U./dL
pH, UA: 5.5 (ref 5.0–8.0)

## 2022-08-01 NOTE — Assessment & Plan Note (Signed)
>>  ASSESSMENT AND PLAN FOR HIP ARTHRITIS WRITTEN ON 08/01/2022  4:30 PM BY SHITAREV, DIMITRY, MEDICAL STUDENT  The patient has chronic pain of her right hip in the setting of past left-sided hip replacement consistent with hip osteoarthritis.  The symptoms are interfering with her lifestyle, but are not unbearable. - Recommended over the counter naproxen twice daily for symptomatic management - Placed physical therapy referral - Ordered hip xray to evaluate progression of osteoarthritis and to assess next steps

## 2022-08-01 NOTE — Assessment & Plan Note (Signed)
The patient had acute onset of urinary frequency in the absence of other symptoms, which was mildly concerning for UTI.  Lack of fevers and associated symptoms is reassuring. - Obtained UA, which resulted negative during the visit; there is no need for treatment - Instructed patient to return if urination is still frequent and bothering her in 2 weeks

## 2022-08-01 NOTE — Assessment & Plan Note (Signed)
The patient has chronic pain of her right hip in the setting of past left-sided hip replacement consistent with hip osteoarthritis.  The symptoms are interfering with her lifestyle, but are not unbearable. - Recommended over the counter naproxen twice daily for symptomatic management - Placed physical therapy referral - Ordered hip xray to evaluate progression of osteoarthritis and to assess next steps

## 2022-08-01 NOTE — Patient Instructions (Addendum)
[name] it is such a joy to meet you today!  I am so sorry that you are having this hip pain.  It certainly sounds like osteoarthritis to me.  I think that a great way to treat this would be to get you plugged in with physical therapy to help with pain and strength and mobility.  You can also take Aleve (naproxen) or ibuprofen if you find that these are helpful in controlling your pain.  I would also like to get an x-ray of your hip to see how things are progressing since we know you have a history of osteoarthritis.  We are collecting a urine sample today to make sure that you do not have a UTI. Your UA was negative! No infection.   Beverly Dubonnet, MD

## 2022-08-01 NOTE — Progress Notes (Cosign Needed Addendum)
SUBJECTIVE:   CHIEF COMPLAINT / HPI:  [name] is a 66 y.o. female with a past medical history of HTN, left hip arthritis s/p total hip replacement, presenting to the clinic for right-sided pelvic pain and urinary frequency.  Right hip pain The patient has been experiencing progressive onset of left-sided pain, which she indicates is located in her inguinal crease and posterior buttock.  She describes an uncomfortable "pulling" sensation with walking.  She has a history of left total hip replacement in 2014 and notes that this pain feels similar to how her pain felt.  The pain is relieved by use of a heating pad and she has not taken any medications for the pain.  The symptoms are not positional and are not relieved by changing position.  Urinary frequency The patient reports that she has been having frequent urination for about 2 weeks with increased urge to urinate.  She denies itching, dysuria, hematuria, abnormal discharge, or fevers during that time.  She is currently sexually active 1-2 times per month in a monogamous relationship and does not use protection.   PERTINENT  PMH / PSH: Patient medications reviewed and reconciled this visit. Current Meds  Medication Sig   albuterol (PROVENTIL HFA) 108 (90 Base) MCG/ACT inhaler INHALE TWO PUFFS BY MOUTH INTO THE LUNGS EVERY 6 HOURS AS NEEDED FOR WHEEZING OR SHORTNESS OF BREATH   amLODipine (NORVASC) 10 MG tablet Take 1 tablet (10 mg total) by mouth every morning.   cholecalciferol (VITAMIN D3) 25 MCG (1000 UNIT) tablet Take 1 tablet (1,000 Units total) by mouth daily.   fluticasone (FLONASE) 50 MCG/ACT nasal spray Place 1 spray into both nostrils daily.   fluticasone (FLOVENT HFA) 110 MCG/ACT inhaler Inhale 1 puff into the lungs daily as needed.   gabapentin (NEURONTIN) 100 MG capsule TAKE 1 CAPSULE BY MOUTH DAILY   loratadine (EQ ALLERGY RELIEF) 10 MG tablet Take 1 tablet (10 mg total) by mouth daily.   losartan (COZAAR) 25 MG  tablet Take 1 tablet (25 mg total) by mouth at bedtime.   metoprolol tartrate (LOPRESSOR) 50 MG tablet TAKE 1 TABLET BY MOUTH TWICE  DAILY   rosuvastatin (CRESTOR) 5 MG tablet TAKE ONE TABLET BY MOUTH EVERY  MONDAY AND FRIDAY.   sertraline (ZOLOFT) 25 MG tablet Take 1 tablet (25 mg total) by mouth daily.   Past Surgical History:  Procedure Laterality Date   COLONOSCOPY     POLYPECTOMY     TOTAL HIP ARTHROPLASTY Left 10/01/2013   DR Mayer Camel   TOTAL HIP ARTHROPLASTY Left 10/01/2013   Procedure: TOTAL HIP ARTHROPLASTY;  Surgeon: Kerin Salen, MD;  Location: [facility];  Service: Orthopedics;  Laterality: Left;    Social History  The patient does not smoke, use illicit drugs, or drink. She lives alone and is currently sexually active in a monogamous relationship.   OBJECTIVE:   BP 110/70   Pulse 73   Wt 205 lb 3.2 oz (93.1 kg)   SpO2 96%   BMI 37.53 kg/m   General: Resting comfortably in chair, alert and at baseline. Cardiovascular: Regular rate and rhythm. Normal S1/S2. No murmurs, rubs, or gallops appreciated. 2+ radial pulses. Pulmonary: Clear bilaterally to ascultation. No increased WOB, no accessory muscle usage. No wheezes, rales, or crackles. Skin: Warm and dry. MSK: Pain with external rotation and flexion of right hip. No trochanteric or SI joint pain to palpation. Negative straight leg raise test. No gait antalgia. Extremities: No peripheral edema bilaterally.  Results for orders placed or performed in visit on 08/01/22 (from the past 24 hour(s))  POCT urinalysis dipstick     Status: Abnormal   Collection Time: 08/01/22  4:13 PM  Result Value Ref Range   Color, UA yellow yellow   Clarity, UA clear clear   Glucose, UA negative negative mg/dL   Bilirubin, UA negative negative   Ketones, POC UA negative negative mg/dL   Spec Grav, UA >=1.030 (A) 1.010 - 1.025   Blood, UA negative negative   pH, UA 5.5 5.0 - 8.0   Protein Ur, POC negative negative mg/dL   Urobilinogen, UA 0.2  0.2 or 1.0 E.U./dL   Nitrite, UA Negative Negative   Leukocytes, UA Negative Negative     ASSESSMENT/PLAN:   Hip arthritis The patient has chronic pain of her right hip in the setting of past left-sided hip replacement consistent with hip osteoarthritis.  The symptoms are interfering with her lifestyle, but are not unbearable. - Recommended over the counter naproxen twice daily for symptomatic management - Placed physical therapy referral - Ordered hip xray to evaluate progression of osteoarthritis and to assess next steps  Urinary frequency The patient had acute onset of urinary frequency in the absence of other symptoms, which was mildly concerning for UTI.  Lack of fevers and associated symptoms is reassuring. 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 Katy Fitch and reviewed the above note, making necessary revisions.  Pearla Dubonnet, MD 08/03/2022, 8:14 AM PGY-2, [facility]

## 2022-08-03 ENCOUNTER — Ambulatory Visit
Admission: RE | Admit: 2022-08-03 | Discharge: 2022-08-03 | Disposition: A | Discharge status: Home / Self Care | Payer: Medicare Other | Source: Ambulatory Visit | Attending: Family Medicine | Admitting: Family Medicine

## 2022-08-03 DIAGNOSIS — M1610 Unilateral primary osteoarthritis, unspecified hip: Secondary | ICD-10-CM

## 2022-08-03 DIAGNOSIS — M25551 Pain in right hip: Secondary | ICD-10-CM | POA: Diagnosis not present

## 2022-08-09 ENCOUNTER — Ambulatory Visit: Payer: Medicare Other

## 2022-08-09 NOTE — Therapy (Incomplete)
OUTPATIENT PHYSICAL THERAPY LOWER EXTREMITY EVALUATION   Patient Name: [name] MRN: [id number] DOB:November 26, 1955, 66 y.o., female Today's Date: 08/09/2022    Past Medical History:  Diagnosis Date   Allergy    Anxiety    Arthritis    degenerative in back and hips   Atypical chest pain 05/03/2015   Low risk Myoview 2016, normal LVF by echo Dec 2020   Carpal tunnel syndrome, bilateral    Chest pain    Elevated PTHrP level 06/19/2018   Syncope   Feeling grief 12/30/2015   Heart murmur    age 51 said had heart murmur.   Hyperlipidemia    Hyperparathyroidism ([place]) 07/25/2019   Hypertension    Normal cardiac stress test    Myoview stress test   OAB (overactive bladder) 09/01/2015   Osteopenia 05/2018   T score -1.1 FRAX 2.5%/ 0.1%   Subclinical hyperthyroidism 05/08/2007   Qualifier: Diagnosis of  By: Jerline Pain MD, Anderson Malta     Past Surgical History:  Procedure Laterality Date   COLONOSCOPY     POLYPECTOMY     TOTAL HIP ARTHROPLASTY Left 10/01/2013   DR Mayer Camel   TOTAL HIP ARTHROPLASTY Left 10/01/2013   Procedure: TOTAL HIP ARTHROPLASTY;  Surgeon: Kerin Salen, MD;  Location: [facility];  Service: Orthopedics;  Laterality: Left;   Patient Active Problem List   Diagnosis Date Noted   Urinary frequency 08/01/2022   Viral syndrome 05/01/2022   Pap smear for cervical cancer screening 01/24/2022   Loud snoring 12/19/2021   Flank mass 11/30/2021   Depressed mood 10/19/2021   Postural dizziness with near syncope 08/26/2021   BMI 37.0-37.9, adult 08/25/2021   Low serum thyroid stimulating hormone (TSH) 07/06/2020   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  Rationale for Evaluation and Treatment Rehabilitation  ONSET DATE: ***  SUBJECTIVE:   SUBJECTIVE STATEMENT: ***  PERTINENT HISTORY: HTN, Osteopenia, L THA PAIN:  Are you having pain?  Yes: NPRS scale: ***/10 Pain location: *** Pain description: *** Aggravating factors: *** Relieving factors: ***  PRECAUTIONS: None  WEIGHT BEARING RESTRICTIONS: No  FALLS:  Has patient fallen in last 6 months? {fallsyesno:27318}  LIVING ENVIRONMENT: Lives with: {OPRC lives with:25569::"lives with their family"} Lives in: {Lives in:25570} Stairs: {opstairs:27293} Has following equipment at home: {Assistive devices:23999}  OCCUPATION: ***  PLOF: {PLOF:24004}  PATIENT GOALS: ***   OBJECTIVE:   DIAGNOSTIC FINDINGS:  CLINICAL DATA:  History of hip osteoarthritis. Right hip pain for 4 months.   EXAM: DG HIP (WITH OR WITHOUT PELVIS) 2-3V RIGHT   COMPARISON:  10/01/2013   FINDINGS: Right hip is located without a fracture. Joint space narrowing along the superior aspect of the right hip joint. Evidence for spurring and subchondral cyst formation involving the superior right acetabulum. Right pelvic bone structures appear to be intact.   IMPRESSION: 1. No acute bone abnormality to the right hip. 2. Osteoarthritis along the superior aspect of the right hip joint.  PATIENT SURVEYS:  FOTO: ***% function; ***% predicted  COGNITION: Overall cognitive  status: {cognition:24006}     SENSATION: {sensation:27233}  EDEMA:  {edema:24020}  MUSCLE LENGTH: Hamstrings: Right *** deg; Left *** deg Marcello Moores test: Right *** deg; Left *** deg  POSTURE: {posture:25561}  PALPATION: ***  LOWER EXTREMITY ROM:  {AROM/PROM:27142} ROM Right eval Left eval  Hip flexion    Hip extension    Hip  abduction    Hip adduction    Hip internal rotation    Hip external rotation    Knee flexion    Knee extension    Ankle dorsiflexion    Ankle plantarflexion    Ankle inversion    Ankle eversion     (Blank rows = not tested)  LOWER EXTREMITY MMT:  MMT Right eval Left eval  Hip flexion    Hip extension    Hip abduction    Hip adduction    Hip internal rotation    Hip external rotation    Knee flexion    Knee extension    Ankle dorsiflexion    Ankle plantarflexion    Ankle inversion    Ankle eversion     (Blank rows = not tested)  LOWER EXTREMITY SPECIAL TESTS:  {LEspecialtests:26242}  FUNCTIONAL TESTS:  30 Second Sit to Stand:   GAIT: Distance walked: *** Assistive device utilized: {Assistive devices:23999} Level of assistance: {Levels of assistance:24026} Comments: ***   TODAY'S TREATMENT                                                                          DATE: ***  PATIENT EDUCATION:  Education details: eval findings, FOTO, HEP, POC Person educated: Patient Education method: Explanation, Demonstration, and Handouts Education comprehension: verbalized understanding and returned demonstration  HOME EXERCISE PROGRAM: ***  ASSESSMENT:  CLINICAL IMPRESSION: Patient is a *** y.o. *** who was seen today for physical therapy evaluation and treatment for ***.   OBJECTIVE IMPAIRMENTS: {opptimpairments:25111}.   ACTIVITY LIMITATIONS: {activitylimitations:27494}  PARTICIPATION LIMITATIONS: {participationrestrictions:25113}  PERSONAL FACTORS: {Personal factors:25162} are also affecting patient's functional outcome.   REHAB POTENTIAL: {rehabpotential:25112}  CLINICAL DECISION MAKING: {clinical decision making:25114}  EVALUATION COMPLEXITY: {Evaluation complexity:25115}   GOALS: Goals reviewed with patient? No  SHORT TERM GOALS: Target date: 08/30/2022  Pt will be compliant and knowledgeable with initial HEP for improved comfort and  carryover Baseline: initial HEP given  Goal status: INITIAL  2.  Pt will self report *** pain no greater than ***/10 for improved comfort and functional ability Baseline: ***/10 at worst Goal status: {GOALSTATUS:25110}   LONG TERM GOALS: Target date: 10/04/2022   Pt will improve FOTO function score to no less than ***% as proxy for functional improvement Baseline: ***% function Goal status: {GOALSTATUS:25110}   2.  Pt will self report *** pain no greater than ***/10 for improved comfort and functional ability Baseline: ***/10 at worst Goal status: {GOALSTATUS:25110}   3.  Pt will increase 30 Second Sit to Stand rep count to no less than *** reps for improved balance, strength, and functional mobility Baseline: *** reps  Goal status: {GOALSTATUS:25110}   4.  *** Baseline:  Goal status: {GOALSTATUS:25110}  5.  *** Baseline:  Goal status: {GOALSTATUS:25110}   PLAN:  PT FREQUENCY: {rehab frequency:25116}  PT DURATION: {rehab duration:25117}  PLANNED INTERVENTIONS: {  rehab planned interventions:25118::"Therapeutic exercises","Therapeutic activity","Neuromuscular re-education","Balance training","Gait training","Patient/Family education","Self Care","Joint mobilization"}  PLAN FOR NEXT SESSION: ***   Ward Chatters, PT 08/09/2022, 9:08 AM

## 2022-08-15 NOTE — Therapy (Deleted)
OUTPATIENT PHYSICAL THERAPY LOWER EXTREMITY EVALUATION   Patient Name: [name] MRN: [id number] DOB:07/24/1956, 66 y.o., female Today's Date: 08/15/2022    Past Medical History:  Diagnosis Date   Allergy    Anxiety    Arthritis    degenerative in back and hips   Atypical chest pain 05/03/2015   Low risk Myoview 2016, normal LVF by echo Dec 2020   Carpal tunnel syndrome, bilateral    Chest pain    Elevated PTHrP level 06/19/2018   Syncope   Feeling grief 12/30/2015   Heart murmur    age 70 said had heart murmur.   Hyperlipidemia    Hyperparathyroidism ([place]) 07/25/2019   Hypertension    Normal cardiac stress test    Myoview stress test   OAB (overactive bladder) 09/01/2015   Osteopenia 05/2018   T score -1.1 FRAX 2.5%/ 0.1%   Subclinical hyperthyroidism 05/08/2007   Qualifier: Diagnosis of  By: Jerline Pain MD, Anderson Malta     Past Surgical History:  Procedure Laterality Date   COLONOSCOPY     POLYPECTOMY     TOTAL HIP ARTHROPLASTY Left 10/01/2013   DR Mayer Camel   TOTAL HIP ARTHROPLASTY Left 10/01/2013   Procedure: TOTAL HIP ARTHROPLASTY;  Surgeon: Kerin Salen, MD;  Location: [facility];  Service: Orthopedics;  Laterality: Left;   Patient Active Problem List   Diagnosis Date Noted   Urinary frequency 08/01/2022   Viral syndrome 05/01/2022   Pap smear for cervical cancer screening 01/24/2022   Loud snoring 12/19/2021   Flank mass 11/30/2021   Depressed mood 10/19/2021   Postural dizziness with near syncope 08/26/2021   BMI 37.0-37.9, adult 08/25/2021   Low serum thyroid stimulating hormone (TSH) 07/06/2020   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 05/17/2020   History of syncope 07/25/2019   Hypercalcemia 07/25/2019   Neuropathic pain 06/19/2018   Other cervical disc degeneration, unspecified cervical region 06/19/2018   Hip arthritis 10/01/2013   Vaginal discharge 09/20/2011   THYROMEGALY 12/30/2009   Hyperlipidemia 11/09/2008   Essential hypertension 01/15/2007   Morbid obesity  ([place]) 12/13/2006    PCP: Sharion Settler, DO  REFERRING PROVIDER: Lind Covert, MD  REFERRING DIAG: M16.10 (ICD-10-CM) - Hip arthritis   THERAPY DIAG: R hip pain/OA  Rationale for Evaluation and Treatment: Rehabilitation  ONSET DATE: chronic  SUBJECTIVE:   SUBJECTIVE STATEMENT: Right hip pain The patient has been experiencing progressive onset of left-sided pain, which she indicates is located in her inguinal crease and posterior buttock.  She describes an uncomfortable "pulling" sensation with walking.  She has a history of left total hip replacement in 2014 and notes that this pain feels similar to how her pain felt.  The pain is relieved by use of a heating pad and she has not taken any medications for the pain.  The symptoms are not positional and are not relieved by changing position.    PERTINENT HISTORY: Hip arthritis The patient has chronic pain of her right hip in the setting of past left-sided hip replacement consistent with hip osteoarthritis.  The symptoms are interfering with her lifestyle, but are not unbearable. - Recommended over the counter naproxen twice daily for symptomatic management - Placed physical therapy referral - Ordered hip xray to evaluate progression of osteoarthritis and to assess next steps PAIN:  Are you having pain? {OPRCPAIN:27236}  PRECAUTIONS: None  WEIGHT BEARING RESTRICTIONS: No  FALLS:  Has patient fallen in last 6 months? No  LIVING ENVIRONMENT: Lives with: lives with their family  OCCUPATION: ***  PLOF: Independent  PATIENT GOALS: ***   OBJECTIVE:   DIAGNOSTIC FINDINGS: CLINICAL DATA:  History of hip osteoarthritis. Right hip pain for 4 months.   EXAM: DG HIP (WITH OR WITHOUT PELVIS) 2-3V RIGHT   COMPARISON:  10/01/2013   FINDINGS: Right hip is located without a fracture. Joint space narrowing along the superior aspect of the right hip joint. Evidence for spurring and subchondral cyst formation  involving the superior right acetabulum. Right pelvic bone structures appear to be intact.   IMPRESSION: 1. No acute bone abnormality to the right hip. 2. Osteoarthritis along the superior aspect of the right hip joint.     Electronically Signed   By: Markus Daft M.D.   On: 08/05/2022 08:48  PATIENT SURVEYS:  FOTO ***    MUSCLE LENGTH: Hamstrings: Right *** deg; Left *** deg Marcello Moores test: Right *** deg; Left *** deg  POSTURE: {posture:25561}  PALPATION: ***  LOWER EXTREMITY ROM:  {AROM/PROM:27142} ROM Right eval Left eval  Hip flexion    Hip extension    Hip abduction    Hip adduction    Hip internal rotation    Hip external rotation    Knee flexion    Knee extension    Ankle dorsiflexion    Ankle plantarflexion    Ankle inversion    Ankle eversion     (Blank rows = not tested)  LOWER EXTREMITY MMT:  MMT Right eval Left eval  Hip flexion    Hip extension    Hip abduction    Hip adduction    Hip internal rotation    Hip external rotation    Knee flexion    Knee extension    Ankle dorsiflexion    Ankle plantarflexion    Ankle inversion    Ankle eversion     (Blank rows = not tested)  LOWER EXTREMITY SPECIAL TESTS:  {LEspecialtests:26242}  FUNCTIONAL TESTS:  {Functional tests:24029}  GAIT: Distance walked: 68f x2 Assistive device utilized: None Level of assistance: Complete Independence Comments: ***   TODAY'S TREATMENT:                                                                                                                              DATE: ***    PATIENT EDUCATION:  Education details: Discussed eval findings, rehab rationale and POC and patient is in agreement  Person educated: Patient Education method: Explanation Education comprehension: verbalized understanding  HOME EXERCISE PROGRAM: ***  ASSESSMENT:  CLINICAL IMPRESSION: Patient is a *** y.o. *** who was seen today for physical therapy evaluation and treatment for  ***.   OBJECTIVE IMPAIRMENTS: {opptimpairments:25111}.   ACTIVITY LIMITATIONS: {activitylimitations:27494}  PERSONAL FACTORS: {Personal factors:25162} are also affecting patient's functional outcome.   REHAB POTENTIAL: {rehabpotential:25112}  CLINICAL DECISION MAKING: {clinical decision making:25114}  EVALUATION COMPLEXITY: {Evaluation complexity:25115}   GOALS: Goals reviewed with patient? {yes/no:20286}  SHORT TERM GOALS: Target date: {follow up:25551}  *** Baseline: Goal status: {GOALSTATUS:25110}  2.  ***  Baseline:  Goal status: {GOALSTATUS:25110}  3.  *** Baseline:  Goal status: {GOALSTATUS:25110}  4.  *** Baseline:  Goal status: {GOALSTATUS:25110}  5.  *** Baseline:  Goal status: {GOALSTATUS:25110}  6.  *** Baseline:  Goal status: {GOALSTATUS:25110}  LONG TERM GOALS: Target date: {follow up:25551}   *** Baseline:  Goal status: {GOALSTATUS:25110}  2.  *** Baseline:  Goal status: {GOALSTATUS:25110}  3.  *** Baseline:  Goal status: {GOALSTATUS:25110}  4.  *** Baseline:  Goal status: {GOALSTATUS:25110}  5.  *** Baseline:  Goal status: {GOALSTATUS:25110}  6.  *** Baseline:  Goal status: {GOALSTATUS:25110}   PLAN:  PT FREQUENCY: {rehab frequency:25116}  PT DURATION: {rehab duration:25117}  PLANNED INTERVENTIONS: {rehab planned interventions:25118::"Therapeutic exercises","Therapeutic activity","Neuromuscular re-education","Balance training","Gait training","Patient/Family education","Self Care","Joint mobilization"}  PLAN FOR NEXT SESSION: ***   Lanice Shirts, PT 08/15/2022, 4:43 PM

## 2022-08-16 ENCOUNTER — Ambulatory Visit: Payer: Medicare Other

## 2022-08-17 ENCOUNTER — Ambulatory Visit: Payer: Medicare Other | Attending: Family Medicine | Admitting: Physical Therapy

## 2022-08-17 ENCOUNTER — Other Ambulatory Visit: Payer: Self-pay

## 2022-08-17 ENCOUNTER — Encounter: Payer: Self-pay | Admitting: Physical Therapy

## 2022-08-17 DIAGNOSIS — M1610 Unilateral primary osteoarthritis, unspecified hip: Secondary | ICD-10-CM | POA: Insufficient documentation

## 2022-08-17 DIAGNOSIS — R2681 Unsteadiness on feet: Secondary | ICD-10-CM | POA: Insufficient documentation

## 2022-08-17 DIAGNOSIS — M6281 Muscle weakness (generalized): Secondary | ICD-10-CM | POA: Insufficient documentation

## 2022-08-17 DIAGNOSIS — M25551 Pain in right hip: Secondary | ICD-10-CM | POA: Insufficient documentation

## 2022-08-17 NOTE — Therapy (Signed)
OUTPATIENT PHYSICAL THERAPY LOWER EXTREMITY EVALUATION  Patient Name: [name] MRN: [id number] DOB:[birth year], 66 y.o., female Today's Date: 08/17/2022   PT End of Session - 08/17/22 0737     Visit Number 1    Number of Visits --   1-2x/week   Date for PT Re-Evaluation 10/12/22    Authorization Type [employer] MCR - FOTO    Progress Note Due on Visit 10    PT Start Time 0702    PT Stop Time 0742    PT Time Calculation (min) 40 min             Past Medical History:  Diagnosis Date   Allergy    Anxiety    Arthritis    degenerative in back and hips   Atypical chest pain 05/03/2015   Low risk Myoview 2016, normal LVF by echo Dec 2020   Carpal tunnel syndrome, bilateral    Chest pain    Elevated PTHrP level 06/19/2018   Syncope   Feeling grief 12/30/2015   Heart murmur    age 3 said had heart murmur.   Hyperlipidemia    Hyperparathyroidism ([place]) 07/25/2019   Hypertension    Normal cardiac stress test    Myoview stress test   OAB (overactive bladder) 09/01/2015   Osteopenia 05/2018   T score -1.1 FRAX 2.5%/ 0.1%   Subclinical hyperthyroidism 05/08/2007   Qualifier: Diagnosis of  By: Jerline Pain MD, Anderson Malta     Past Surgical History:  Procedure Laterality Date   COLONOSCOPY     POLYPECTOMY     TOTAL HIP ARTHROPLASTY Left 10/01/2013   DR Mayer Camel   TOTAL HIP ARTHROPLASTY Left 10/01/2013   Procedure: TOTAL HIP ARTHROPLASTY;  Surgeon: Kerin Salen, MD;  Location: [facility];  Service: Orthopedics;  Laterality: Left;   Patient Active Problem List   Diagnosis Date Noted   Urinary frequency 08/01/2022   Viral syndrome 05/01/2022   Pap smear for cervical cancer screening 01/24/2022   Loud snoring 12/19/2021   Flank mass 11/30/2021   Depressed mood 10/19/2021   Postural dizziness with near syncope 08/26/2021   BMI 37.0-37.9, adult 08/25/2021   Low serum thyroid stimulating hormone (TSH) 07/06/2020   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 05/17/2020   History of syncope 07/25/2019    Hypercalcemia 07/25/2019   Neuropathic pain 06/19/2018   Other cervical disc degeneration, unspecified cervical region 06/19/2018   Hip arthritis 10/01/2013   Vaginal discharge 09/20/2011   THYROMEGALY 12/30/2009   Hyperlipidemia 11/09/2008   Essential hypertension 01/15/2007   Morbid obesity ([place]) 12/13/2006    PCP: Sharion Settler, DO  REFERRING PROVIDER: Lind Covert, *  THERAPY DIAG:  Pain in right hip - Plan: PT plan of care cert/re-cert  Muscle weakness - Plan: PT plan of care cert/re-cert  Unsteadiness on feet - Plan: PT plan of care cert/re-cert  REFERRING DIAG: Hip arthritis [M16.10]   Rationale for Evaluation and Treatment:  Rehabilitation  SUBJECTIVE:  PERTINENT PAST HISTORY:  L THA        PRECAUTIONS: None  WEIGHT BEARING RESTRICTIONS No  FALLS:  Has patient fallen in last 6 months? No, Number of falls: 0  MOI/History of condition:  Onset date: 3 months  SUBJECTIVE STATEMENT  [name] is a 66 y.o. female who presents to clinic with chief complaint of intermittent R sided hip pain.  The pain happens multiple times a week but not necessarily every day.  The pain occurs most often when going from sitting to standing.  The pain is in the groin region but can radiate to the knee.  She feels this is the same type of pain that proceeded her L hip replacement.  From referring provider:   "The patient has been experiencing progressive onset of left-sided pain, which she indicates is located in her inguinal crease and posterior buttock.  She describes an uncomfortable "pulling" sensation with walking.  She has a history of left total hip replacement in 2014 and notes that this pain feels similar to how her pain felt.  The pain is relieved by use of a heating pad and she has not taken any medications for the pain.  The symptoms are not positional and are not relieved by changing position. "   Red flags:  denies bil numbness and tingling, BB  changes, and saddle anesthesia  Pain:  Are you having pain? Yes Pain location: R groin to posterior glute max with occasional radiation to R lateral knee NPRS scale:  current 0/10  average 8/10  Aggravating factors: sitting, rotation  NPRS, highest: 8/10 Relieving factors: rest  NPRS: best: 0/10 Pain description: intermittent, sharp, and aching Stage: Chronic Stability: staying the same 24 hour pattern: worse in morning and with activity   Occupation: sitting geriatric person  Assistive Device: none  Hand Dominance: na  Patient Goals/Specific Activities: reduce pain, avoid surgery   OBJECTIVE:   DIAGNOSTIC FINDINGS:  IMPRESSION: 1. No acute bone abnormality to the right hip. 2. Osteoarthritis along the superior aspect of the right hip joint.  SENSATION:  Light touch: Appears intact  PALPATION TTP R GT  MUSCLE LENGTH: Hamstrings: Right subtle restriction; Left subtle restriction ASLR: Right ASLR significantly reduced compared to PSLR ; Left ASLR = PSLR Thomas test: Right subtle restriction; Left no restriction  LE MMT:  MMT Right 08/17/2022 Left 08/17/2022  Hip flexion (L2, L3) 4* 5  Knee extension (L3) 4* 5  Knee flexion 4* 5  Hip abduction 3+* 4  Hip extension 3+* 4+  Hip external rotation    Hip internal rotation    Hip adduction    Ankle dorsiflexion (L4)    Ankle plantarflexion (S1)    Ankle inversion    Ankle eversion    Great Toe ext (L5)    Grossly     (Blank rows = not tested, score listed is out of 5 possible points.  N = WNL, D = diminished, C = clear for gross weakness with myotome testing, * = concordant pain with testing)  Hip OA Test cluster (3/5 items + LR of 5.2; 4/5 items + LR of >10)  (+) scour: positive Hip IR <25 degrees: positive Hip pain while squatting: positive Active hip flexion causes lateral hip pain: positive Active hip ext is painful: equivocal    LE ROM:  ROM Right 08/17/2022 Left 08/17/2022  Hip flexion     Hip extension    Hip abduction    Hip adduction    Hip internal rotation    Hip external rotation    Knee flexion    Knee extension    Ankle dorsiflexion    Ankle plantarflexion    Ankle inversion    Ankle eversion     (Blank rows = not tested, N = WNL, * = concordant pain with testing)  Functional Tests  Eval (08/17/2022)    30'' STS: 12x  UE used? N    SLS balance significantly more difficult on R vs L  LOWER EXTREMITY SPECIAL TESTS:  Slump (-) SLR (-)   PATIENT SURVEYS:  FOTO 47 -> 60   TODAY'S TREATMENT: Creating, reviewing, and completing below HEP   PATIENT EDUCATION:  POC, diagnosis, prognosis, HEP, and outcome measures.  Pt educated via explanation, demonstration, and handout (HEP).  Pt confirms understanding verbally.   HOME EXERCISE PROGRAM: Access Code: [email or web address] URL: [email or web address] Date: 08/17/2022 Prepared by: Shearon Balo  Exercises - Hooklying Isometric Clamshell  - 1 x daily - 7 x weekly - 3 sets - 10 reps - Posterior Pelvic Tilt with Adduction Using Pillow in Hooklying  - 2 x daily - 7 x weekly - 10 reps - 10 second hold - Supine Bridge  - 1 x daily - 7 x weekly - 3 sets - 10 reps  ASTERISK SIGNS   Asterisk Signs Eval (08/17/2022)       R hip ext limitation +       R hip abd 3+/5 w/ P!       SLS balance Sig worse R vs L                         ASSESSMENT:  CLINICAL IMPRESSION: Whittney is a 66 y.o. female who presents to clinic with signs and sxs consistent with R hip pain secondary to hip OA.  There is possibly some element of concurrent GTPS.  Ruling down nerve root irritation with (-) slump and SLR.    OBJECTIVE IMPAIRMENTS: Pain, R hip ROM, LE and hip strength, balance  ACTIVITY LIMITATIONS: squatting, lifting, work, housework  PERSONAL FACTORS: See medical history and pertinent history   REHAB POTENTIAL: Good  CLINICAL DECISION MAKING:  Stable/uncomplicated  EVALUATION COMPLEXITY: Low   GOALS:   SHORT TERM GOALS: Target date: 09/14/2022  Geovana will be >75% HEP compliant to improve carryover between sessions and facilitate independent management of condition  Evaluation (08/17/2022): ongoing Goal status: INITIAL   LONG TERM GOALS: Target date: 10/12/2022  Leatha will improve FOTO score to 60 as a proxy for functional improvement  Evaluation/Baseline (08/17/2022): 47 Goal status: INITIAL   2.  Panagiota will self report >/= 50% decrease in pain from evaluation   Evaluation/Baseline (08/17/2022): 8/10 max pain Goal status: INITIAL   3.  Markeisha will improve the following MMTs to >/= 4/5 to show improvement in strength:     Evaluation/Baseline (08/17/2022):   LE MMT:  MMT Right 08/17/2022 Left 08/17/2022  Hip flexion (L2, L3) 4* 5  Knee extension (L3) 4* 5  Knee flexion 4* 5  Hip abduction 3+* 4  Hip extension 3+* 4+  Hip external rotation    Hip internal rotation    Hip adduction    Ankle dorsiflexion (L4)    Ankle plantarflexion (S1)    Ankle inversion    Ankle eversion    Great Toe ext (L5)    Grossly     (Blank rows = not tested, score listed is out of 5 possible points.  N = WNL, D = diminished, C = clear for gross weakness with myotome testing, * = concordant pain with testing)  Goal status: INITIAL   4.  Kinberly will be able to stand for >30'' in R SLS stance, to show a significant improvement in balance in order to reduce fall risk   Evaluation/Baseline (08/17/2022): 10'' significant instability compared to L Goal status: INITIAL    PLAN: PT FREQUENCY: 1-2x/week  PT DURATION: 8 weeks (Ending 10/12/2022)  PLANNED INTERVENTIONS: Therapeutic exercises,  Aquatic therapy, Therapeutic activity, Neuro Muscular re-education, Gait training, Patient/Family education, Joint mobilization, Dry Needling, Electrical stimulation, Spinal mobilization and/or manipulation, Moist heat, Taping,  Vasopneumatic device, Ionotophoresis '4mg'$ /ml Dexamethasone, and Manual therapy  PLAN FOR NEXT SESSION: progressive hip/LE/core, balance   Shearon Balo PT, DPT 08/17/2022, 7:46 AM

## 2022-08-24 ENCOUNTER — Ambulatory Visit: Payer: Medicare Other | Admitting: Physical Therapy

## 2022-08-24 ENCOUNTER — Encounter: Payer: Self-pay | Admitting: Physical Therapy

## 2022-08-24 DIAGNOSIS — M25551 Pain in right hip: Secondary | ICD-10-CM

## 2022-08-24 DIAGNOSIS — R2681 Unsteadiness on feet: Secondary | ICD-10-CM | POA: Diagnosis not present

## 2022-08-24 DIAGNOSIS — M6281 Muscle weakness (generalized): Secondary | ICD-10-CM | POA: Diagnosis not present

## 2022-08-24 DIAGNOSIS — M1610 Unilateral primary osteoarthritis, unspecified hip: Secondary | ICD-10-CM | POA: Diagnosis not present

## 2022-08-24 NOTE — Therapy (Signed)
OUTPATIENT PHYSICAL THERAPY TREATMENT NOTE   Patient Name: [name] MRN: [id number] DOB:13-Mar-1956, 66 y.o., female Today's Date: 08/24/2022  PCP: Sharion Settler, DO   REFERRING PROVIDER: Lind Covert, *   PT End of Session - 08/24/22 0742     Visit Number 2    Number of Visits --   1-2x/week   Date for PT Re-Evaluation 10/12/22    Authorization Type [employer] MCR - FOTO    Progress Note Due on Visit 10    PT Start Time 0745    PT Stop Time 0827    PT Time Calculation (min) 42 min             Past Medical History:  Diagnosis Date   Allergy    Anxiety    Arthritis    degenerative in back and hips   Atypical chest pain 05/03/2015   Low risk Myoview 2016, normal LVF by echo Dec 2020   Carpal tunnel syndrome, bilateral    Chest pain    Elevated PTHrP level 06/19/2018   Syncope   Feeling grief 12/30/2015   Heart murmur    age 22 said had heart murmur.   Hyperlipidemia    Hyperparathyroidism ([place]) 07/25/2019   Hypertension    Normal cardiac stress test    Myoview stress test   OAB (overactive bladder) 09/01/2015   Osteopenia 05/2018   T score -1.1 FRAX 2.5%/ 0.1%   Subclinical hyperthyroidism 05/08/2007   Qualifier: Diagnosis of  By: Jerline Pain MD, Anderson Malta     Past Surgical History:  Procedure Laterality Date   COLONOSCOPY     POLYPECTOMY     TOTAL HIP ARTHROPLASTY Left 10/01/2013   DR Mayer Camel   TOTAL HIP ARTHROPLASTY Left 10/01/2013   Procedure: TOTAL HIP ARTHROPLASTY;  Surgeon: Kerin Salen, MD;  Location: [facility];  Service: Orthopedics;  Laterality: Left;   Patient Active Problem List   Diagnosis Date Noted   Urinary frequency 08/01/2022   Viral syndrome 05/01/2022   Pap smear for cervical cancer screening 01/24/2022   Loud snoring 12/19/2021   Flank mass 11/30/2021   Depressed mood 10/19/2021   Postural dizziness with near syncope 08/26/2021   BMI 37.0-37.9, adult 08/25/2021   Low serum thyroid stimulating hormone (TSH) 07/06/2020    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 05/17/2020   History of syncope 07/25/2019   Hypercalcemia 07/25/2019   Neuropathic pain 06/19/2018   Other cervical disc degeneration, unspecified cervical region 06/19/2018   Hip arthritis 10/01/2013   Vaginal discharge 09/20/2011   THYROMEGALY 12/30/2009   Hyperlipidemia 11/09/2008   Essential hypertension 01/15/2007   Morbid obesity ([place]) 12/13/2006    THERAPY DIAG:  Pain in right hip  Muscle weakness  Unsteadiness on feet   Rationale for Evaluation and Treatment Rehabilitation  REFERRING DIAG: Hip arthritis [M16.10]    PERTINENT HISTORY: L THA    PRECAUTIONS/RESTRICTIONS:   none  SUBJECTIVE:  Pt reports non-compliance with HEP.  Her pain is variable.   Pain:  Are you having pain? Yes Pain location: R groin to posterior glute max with occasional radiation to R lateral knee NPRS scale:  current 0/10  average 8/10  Aggravating factors: sitting, rotation Relieving factors: rest Pain description: intermittent, sharp, and aching Stage: Chronic Stability: staying the same 24 hour pattern: worse in morning and with activity   OBJECTIVE: (objective measures completed at initial evaluation unless otherwise dated)  DIAGNOSTIC FINDINGS:  IMPRESSION: 1. No acute bone abnormality to the right hip. 2. 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   SENSATION:          Light touch: Appears intact   PALPATION TTP R GT   MUSCLE LENGTH: Hamstrings: Right subtle restriction; Left subtle restriction ASLR: Right ASLR significantly reduced compared to PSLR ; Left ASLR = PSLR Thomas test: Right subtle restriction; Left no restriction   LE MMT:   MMT Right 08/17/2022 Left 08/17/2022  Hip flexion (L2, L3) 4* 5  Knee extension (L3) 4* 5  Knee flexion 4* 5  Hip abduction 3+* 4  Hip extension 3+* 4+  Hip external rotation      Hip internal rotation      Hip adduction      Ankle dorsiflexion (L4)      Ankle  plantarflexion (S1)      Ankle inversion      Ankle eversion      Great Toe ext (L5)      Grossly        (Blank rows = not tested, score listed is out of 5 possible points.  N = WNL, D = diminished, C = clear for gross weakness with myotome testing, * = concordant pain with testing)   Hip OA Test cluster (3/5 items + LR of 5.2; 4/5 items + LR of >10)   (+) scour: positive Hip IR <25 degrees: positive Hip pain while squatting: positive Active hip flexion causes lateral hip pain: positive Active hip ext is painful: equivocal       LE ROM:   ROM Right 08/17/2022 Left 08/17/2022  Hip flexion      Hip extension      Hip abduction      Hip adduction      Hip internal rotation      Hip external rotation      Knee flexion      Knee extension      Ankle dorsiflexion      Ankle plantarflexion      Ankle inversion      Ankle eversion        (Blank rows = not tested, N = WNL, * = concordant pain with testing)   Functional Tests   Eval (08/17/2022)      30'' STS: 12x  UE used? N      SLS balance significantly more difficult on R vs L                                                                                              LOWER EXTREMITY SPECIAL TESTS:  Slump (-) SLR (-)     PATIENT SURVEYS:  FOTO 47 -> 60         HOME EXERCISE PROGRAM: Access Code: [email or web address] URL: [email or web address] Date: 08/17/2022 Prepared by: Shearon Balo   Exercises - Hooklying Isometric Clamshell  - 1 x daily - 7 x weekly - 3 sets - 10 reps - Posterior Pelvic Tilt with Adduction Using Pillow in Hooklying  - 2 x daily - 7 x weekly - 10 reps - 10 second hold - Supine Bridge  - 1 x daily - 7 x weekly -  3 sets - 10 reps   ASTERISK SIGNS     Asterisk Signs Eval (08/17/2022)            R hip ext limitation +            R hip abd 3+/5 w/ P!            SLS balance Sig worse R vs L                                             TREATMENT  08/24/2022:  Therapeutic Exercise: - nu-step L5 16mwhile taking subjective and planning session with patient - Thomas stretch EOB - 1.5' - LTR - 20x - Black TB - clam - 3x10 - supine march with black TB - 3x10 - bridge with black TB for ER cue - 3x10 - hip adduction ball squeeze - 2x10 - 5'' - sit to stand - 3x10 - step up - 4'' step - 2x10 ea fwd and lat - mini squat with UE support - 3x10   ASSESSMENT:   CLINICAL IMPRESSION: MKennethiatolerated session well with no adverse reaction.  We concentrated on mat hip and core exercises to good effect.  She is a little hesitant with hip flexor stretch, but is able to complete with good form after explanation of benefit.   OBJECTIVE IMPAIRMENTS: Pain, R hip ROM, LE and hip strength, balance   ACTIVITY LIMITATIONS: squatting, lifting, work, housework   PERSONAL FACTORS: See medical history and pertinent history     REHAB POTENTIAL: Good   CLINICAL DECISION MAKING: Stable/uncomplicated   EVALUATION COMPLEXITY: Low     GOALS:     SHORT TERM GOALS: Target date: 09/14/2022   MFlorawill be >75% HEP compliant to improve carryover between sessions and facilitate independent management of condition   Evaluation (08/17/2022): ongoing Goal status: INITIAL     LONG TERM GOALS: Target date: 10/12/2022   MTeonawill improve FOTO score to 60 as a proxy for functional improvement   Evaluation/Baseline (08/17/2022): 47 Goal status: INITIAL     2.  MDaiwill self report >/= 50% decrease in pain from evaluation    Evaluation/Baseline (08/17/2022): 8/10 max pain Goal status: INITIAL     3.  MEnoliawill improve the following MMTs to >/= 4/5 to show improvement in strength:      Evaluation/Baseline (08/17/2022):    LE MMT:   MMT Right 08/17/2022 Left 08/17/2022  Hip flexion (L2, L3) 4* 5  Knee extension (L3) 4* 5  Knee flexion 4* 5  Hip abduction 3+* 4  Hip extension 3+* 4+  Hip external rotation      Hip internal rotation       Hip adduction      Ankle dorsiflexion (L4)      Ankle plantarflexion (S1)      Ankle inversion      Ankle eversion      Great Toe ext (L5)      Grossly        (Blank rows = not tested, score listed is out of 5 possible points.  N = WNL, D = diminished, C = clear for gross weakness with myotome testing, * = concordant pain with testing)   Goal status: INITIAL     4.  MJasiawill be able to stand for >30'' in R SLS stance, to show  a significant improvement in balance in order to reduce fall risk    Evaluation/Baseline (08/17/2022): 10'' significant instability compared to L Goal status: INITIAL       PLAN: PT FREQUENCY: 1-2x/week   PT DURATION: 8 weeks (Ending 10/12/2022)   PLANNED INTERVENTIONS: Therapeutic exercises, Aquatic therapy, Therapeutic activity, Neuro Muscular re-education, Gait training, Patient/Family education, Joint mobilization, Dry Needling, Electrical stimulation, Spinal mobilization and/or manipulation, Moist heat, Taping, Vasopneumatic device, Ionotophoresis '4mg'$ /ml Dexamethasone, and Manual therapy   PLAN FOR NEXT SESSION: progressive hip/LE/core, balance   Kevan Ny Chiefland Carmack PT 08/24/2022, 8:30 AM

## 2022-08-26 ENCOUNTER — Encounter: Payer: Self-pay | Admitting: Physical Therapy

## 2022-08-26 ENCOUNTER — Ambulatory Visit: Payer: Medicare Other | Admitting: Physical Therapy

## 2022-08-26 DIAGNOSIS — M1610 Unilateral primary osteoarthritis, unspecified hip: Secondary | ICD-10-CM | POA: Diagnosis not present

## 2022-08-26 DIAGNOSIS — R2681 Unsteadiness on feet: Secondary | ICD-10-CM

## 2022-08-26 DIAGNOSIS — M25551 Pain in right hip: Secondary | ICD-10-CM

## 2022-08-26 DIAGNOSIS — M6281 Muscle weakness (generalized): Secondary | ICD-10-CM

## 2022-08-26 NOTE — Therapy (Signed)
OUTPATIENT PHYSICAL THERAPY TREATMENT NOTE   Patient Name: [name] MRN: [id number] DOB:1956/02/14, 66 y.o., female Today's Date: 08/26/2022  PCP: Sharion Settler, DO   REFERRING PROVIDER: Lind Covert, *   PT End of Session - 08/26/22 1884     Visit Number 3    Number of Visits --   1-2x/week   Date for PT Re-Evaluation 10/12/22    Authorization Type [employer] MCR - FOTO    Progress Note Due on Visit 10    PT Start Time 0815    PT Stop Time 0856    PT Time Calculation (min) 41 min             Past Medical History:  Diagnosis Date   Allergy    Anxiety    Arthritis    degenerative in back and hips   Atypical chest pain 05/03/2015   Low risk Myoview 2016, normal LVF by echo Dec 2020   Carpal tunnel syndrome, bilateral    Chest pain    Elevated PTHrP level 06/19/2018   Syncope   Feeling grief 12/30/2015   Heart murmur    age 65 said had heart murmur.   Hyperlipidemia    Hyperparathyroidism ([place]) 07/25/2019   Hypertension    Normal cardiac stress test    Myoview stress test   OAB (overactive bladder) 09/01/2015   Osteopenia 05/2018   T score -1.1 FRAX 2.5%/ 0.1%   Subclinical hyperthyroidism 05/08/2007   Qualifier: Diagnosis of  By: Jerline Pain MD, Anderson Malta     Past Surgical History:  Procedure Laterality Date   COLONOSCOPY     POLYPECTOMY     TOTAL HIP ARTHROPLASTY Left 10/01/2013   DR Mayer Camel   TOTAL HIP ARTHROPLASTY Left 10/01/2013   Procedure: TOTAL HIP ARTHROPLASTY;  Surgeon: Kerin Salen, MD;  Location: [facility];  Service: Orthopedics;  Laterality: Left;   Patient Active Problem List   Diagnosis Date Noted   Urinary frequency 08/01/2022   Viral syndrome 05/01/2022   Pap smear for cervical cancer screening 01/24/2022   Loud snoring 12/19/2021   Flank mass 11/30/2021   Depressed mood 10/19/2021   Postural dizziness with near syncope 08/26/2021   BMI 37.0-37.9, adult 08/25/2021   Low serum thyroid stimulating hormone (TSH) 07/06/2020    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 05/17/2020   History of syncope 07/25/2019   Hypercalcemia 07/25/2019   Neuropathic pain 06/19/2018   Other cervical disc degeneration, unspecified cervical region 06/19/2018   Hip arthritis 10/01/2013   Vaginal discharge 09/20/2011   THYROMEGALY 12/30/2009   Hyperlipidemia 11/09/2008   Essential hypertension 01/15/2007   Morbid obesity ([place]) 12/13/2006    THERAPY DIAG:  Pain in right hip  Muscle weakness  Unsteadiness on feet   Rationale for Evaluation and Treatment Rehabilitation  REFERRING DIAG: Hip arthritis [M16.10]    PERTINENT HISTORY: L THA    PRECAUTIONS/RESTRICTIONS:   none  SUBJECTIVE:  Pt reports improved compliance with HEP.  She reports if she moves slow that it is bearable.   Pain:  Are you having pain? Yes Pain location: R groin to posterior glute max with occasional radiation to R lateral knee NPRS scale:  current 0/10  average 8/10  Aggravating factors: sitting, rotation Relieving factors: rest Pain description: intermittent, sharp, and aching Stage: Chronic Stability: staying the same 24 hour pattern: worse in morning and with activity   OBJECTIVE: (objective measures completed at initial evaluation unless otherwise dated)  DIAGNOSTIC FINDINGS:  IMPRESSION: 1. No acute bone abnormality to the  right hip. 2. Osteoarthritis along the superior aspect of the right hip joint.   SENSATION:          Light touch: Appears intact   PALPATION TTP R GT   MUSCLE LENGTH: Hamstrings: Right subtle restriction; Left subtle restriction ASLR: Right ASLR significantly reduced compared to PSLR ; Left ASLR = PSLR Thomas test: Right subtle restriction; Left no restriction   LE MMT:   MMT Right 08/17/2022 Left 08/17/2022  Hip flexion (L2, L3) 4* 5  Knee extension (L3) 4* 5  Knee flexion 4* 5  Hip abduction 3+* 4  Hip extension 3+* 4+  Hip external rotation      Hip internal rotation      Hip adduction      Ankle  dorsiflexion (L4)      Ankle plantarflexion (S1)      Ankle inversion      Ankle eversion      Great Toe ext (L5)      Grossly        (Blank rows = not tested, score listed is out of 5 possible points.  N = WNL, D = diminished, C = clear for gross weakness with myotome testing, * = concordant pain with testing)   Hip OA Test cluster (3/5 items + LR of 5.2; 4/5 items + LR of >10)   (+) scour: positive Hip IR <25 degrees: positive Hip pain while squatting: positive Active hip flexion causes lateral hip pain: positive Active hip ext is painful: equivocal       LE ROM:   ROM Right 08/17/2022 Left 08/17/2022  Hip flexion      Hip extension      Hip abduction      Hip adduction      Hip internal rotation      Hip external rotation      Knee flexion      Knee extension      Ankle dorsiflexion      Ankle plantarflexion      Ankle inversion      Ankle eversion        (Blank rows = not tested, N = WNL, * = concordant pain with testing)   Functional Tests   Eval (08/17/2022)      30'' STS: 12x  UE used? N      SLS balance significantly more difficult on R vs L                                                                                              LOWER EXTREMITY SPECIAL TESTS:  Slump (-) SLR (-)     PATIENT SURVEYS:  FOTO 47 -> 60         HOME EXERCISE PROGRAM: Access Code: [email or web address] URL: [email or web address] Date: 08/17/2022 Prepared by: Shearon Balo   Exercises - Hooklying Isometric Clamshell  - 1 x daily - 7 x weekly - 3 sets - 10 reps - Posterior Pelvic Tilt with Adduction Using Pillow in Hooklying  - 2 x daily - 7 x weekly - 10 reps - 10 second hold - Supine Bridge  -  1 x daily - 7 x weekly - 3 sets - 10 reps   ASTERISK SIGNS     Asterisk Signs Eval (08/17/2022) 11/11           R hip ext limitation + improving           R hip abd 3+/5 w/ P! 3+ w/ minimal pain           SLS balance Sig worse R vs L                                              TREATMENT 08/26/2022:  Therapeutic Exercise: - nu-step L5 83mwhile taking subjective and planning session with patient - Thomas stretch EOB - 1.5' - LTR 90-90 - 20x - S/L green TB clam - 3x10 ea - staggered bridge - 3x10 - hip adduction ball squeeze - 2x10 - 5'' - lateral stepping with GTB at ankles - 4 laps - leg press - 3x10 @ 75 - step up 6'' - 2x10 ea   ASSESSMENT:   CLINICAL IMPRESSION: MYarimatolerated session well with no adverse reaction.  We concentrated on hip extension and lateral hip strengthening.  Pt cued for form and pacing throughout.  Continue per POC.   OBJECTIVE IMPAIRMENTS: Pain, R hip ROM, LE and hip strength, balance   ACTIVITY LIMITATIONS: squatting, lifting, work, housework   PERSONAL FACTORS: See medical history and pertinent history     REHAB POTENTIAL: Good   CLINICAL DECISION MAKING: Stable/uncomplicated   EVALUATION COMPLEXITY: Low     GOALS:     SHORT TERM GOALS: Target date: 09/14/2022   MLivywill be >75% HEP compliant to improve carryover between sessions and facilitate independent management of condition   Evaluation (08/17/2022): ongoing Goal status: INITIAL     LONG TERM GOALS: Target date: 10/12/2022   MShaniecewill improve FOTO score to 60 as a proxy for functional improvement   Evaluation/Baseline (08/17/2022): 47 Goal status: INITIAL     2.  MMaryssawill self report >/= 50% decrease in pain from evaluation    Evaluation/Baseline (08/17/2022): 8/10 max pain Goal status: INITIAL     3.  MAngeliawill improve the following MMTs to >/= 4/5 to show improvement in strength:      Evaluation/Baseline (08/17/2022):    LE MMT:   MMT Right 08/17/2022 Left 08/17/2022  Hip flexion (L2, L3) 4* 5  Knee extension (L3) 4* 5  Knee flexion 4* 5  Hip abduction 3+* 4  Hip extension 3+* 4+  Hip external rotation      Hip internal rotation      Hip adduction      Ankle dorsiflexion (L4)      Ankle  plantarflexion (S1)      Ankle inversion      Ankle eversion      Great Toe ext (L5)      Grossly        (Blank rows = not tested, score listed is out of 5 possible points.  N = WNL, D = diminished, C = clear for gross weakness with myotome testing, * = concordant pain with testing)   Goal status: INITIAL     4.  MShelbewill be able to stand for >30'' in R SLS stance, to show a significant improvement in balance in order to reduce fall risk  Evaluation/Baseline (08/17/2022): 10'' significant instability compared to L Goal status: INITIAL       PLAN: PT FREQUENCY: 1-2x/week   PT DURATION: 8 weeks (Ending 10/12/2022)   PLANNED INTERVENTIONS: Therapeutic exercises, Aquatic therapy, Therapeutic activity, Neuro Muscular re-education, Gait training, Patient/Family education, Joint mobilization, Dry Needling, Electrical stimulation, Spinal mobilization and/or manipulation, Moist heat, Taping, Vasopneumatic device, Ionotophoresis '4mg'$ /ml Dexamethasone, and Manual therapy   PLAN FOR NEXT SESSION: progressive hip/LE/core, balance   Kevan Ny Maddison Kilner PT 08/26/2022, 8:58 AM

## 2022-08-31 ENCOUNTER — Encounter: Payer: Self-pay | Admitting: Physical Therapy

## 2022-08-31 ENCOUNTER — Ambulatory Visit: Payer: Medicare Other | Admitting: Physical Therapy

## 2022-08-31 DIAGNOSIS — M25551 Pain in right hip: Secondary | ICD-10-CM | POA: Diagnosis not present

## 2022-08-31 DIAGNOSIS — M6281 Muscle weakness (generalized): Secondary | ICD-10-CM | POA: Diagnosis not present

## 2022-08-31 DIAGNOSIS — R2681 Unsteadiness on feet: Secondary | ICD-10-CM

## 2022-08-31 DIAGNOSIS — M1610 Unilateral primary osteoarthritis, unspecified hip: Secondary | ICD-10-CM | POA: Diagnosis not present

## 2022-08-31 NOTE — Therapy (Signed)
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   Hyperlipidemia    Hyperparathyroidism ([place]) 07/25/2019   Hypertension    Normal cardiac stress test    Myoview stress test   OAB (overactive bladder) 09/01/2015   Osteopenia 05/2018   T score -1.1 FRAX 2.5%/ 0.1%   Subclinical hyperthyroidism 05/08/2007   Qualifier: Diagnosis of  By: Jerline Pain MD, Anderson Malta     Past Surgical History:  Procedure Laterality Date   COLONOSCOPY     POLYPECTOMY     TOTAL HIP ARTHROPLASTY Left 10/01/2013   DR Mayer Camel   TOTAL HIP ARTHROPLASTY Left 10/01/2013   Procedure: TOTAL HIP ARTHROPLASTY;  Surgeon: Kerin Salen, MD;  Location: [facility];  Service: Orthopedics;  Laterality: Left;   Patient Active Problem List   Diagnosis Date Noted   Urinary frequency 08/01/2022   Viral syndrome 05/01/2022   Pap smear for cervical cancer screening 01/24/2022   Loud snoring 12/19/2021   Flank mass 11/30/2021   Depressed mood 10/19/2021   Postural dizziness with near syncope 08/26/2021   BMI 37.0-37.9, adult 08/25/2021   Low serum thyroid stimulating hormone (TSH) 07/06/2020    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 05/17/2020   History of syncope 07/25/2019   Hypercalcemia 07/25/2019   Neuropathic pain 06/19/2018   Other cervical disc degeneration, unspecified cervical region 06/19/2018   Hip arthritis 10/01/2013   Vaginal discharge 09/20/2011   THYROMEGALY 12/30/2009   Hyperlipidemia 11/09/2008   Essential hypertension 01/15/2007   Morbid obesity ([place]) 12/13/2006    THERAPY DIAG:  Pain in right hip  Muscle weakness  Unsteadiness on feet   Rationale for Evaluation and Treatment Rehabilitation  REFERRING DIAG: Hip arthritis [M16.10]    PERTINENT HISTORY: L THA    PRECAUTIONS/RESTRICTIONS:   none  SUBJECTIVE:  Pt reports that she is seeing some improvement in her R hip pain.  Pain:  Are you having pain? Yes Pain location: R groin to posterior glute max with occasional radiation to R lateral knee NPRS scale:  current 0/10  average 8/10  Aggravating factors: sitting, rotation Relieving factors: rest Pain description: intermittent, sharp, and aching Stage: Chronic Stability: staying the same 24 hour pattern: worse in morning and with activity   OBJECTIVE: (objective measures completed at initial evaluation unless otherwise dated)  DIAGNOSTIC FINDINGS:  IMPRESSION: 1. No acute bone abnormality to the right hip. 2. 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   SENSATION:          Light touch: Appears intact   PALPATION TTP R GT   MUSCLE LENGTH: Hamstrings: Right subtle restriction; Left subtle restriction ASLR: Right ASLR significantly reduced compared to PSLR ; Left ASLR = PSLR Thomas test: Right subtle restriction; Left no restriction   LE MMT:   MMT Right 08/17/2022 Left 08/17/2022  Hip flexion (L2, L3) 4* 5  Knee extension (L3) 4* 5  Knee flexion 4* 5  Hip abduction 3+* 4  Hip extension 3+* 4+  Hip external rotation      Hip internal rotation      Hip adduction      Ankle dorsiflexion (L4)      Ankle  plantarflexion (S1)      Ankle inversion      Ankle eversion      Great Toe ext (L5)      Grossly        (Blank rows = not tested, score listed is out of 5 possible points.  N = WNL, D = diminished, C = clear for gross weakness with myotome testing, * = concordant pain with testing)   Hip OA Test cluster (3/5 items + LR of 5.2; 4/5 items + LR of >10)   (+) scour: positive Hip IR <25 degrees: positive Hip pain while squatting: positive Active hip flexion causes lateral hip pain: positive Active hip ext is painful: equivocal       LE ROM:   ROM Right 08/17/2022 Left 08/17/2022  Hip flexion      Hip extension      Hip abduction      Hip adduction      Hip internal rotation      Hip external rotation      Knee flexion      Knee extension      Ankle dorsiflexion      Ankle plantarflexion      Ankle inversion      Ankle eversion        (Blank rows = not tested, N = WNL, * = concordant pain with testing)   Functional Tests   Eval (08/17/2022)      30'' STS: 12x  UE used? N      SLS balance significantly more difficult on R vs L                                                                                              LOWER EXTREMITY SPECIAL TESTS:  Slump (-) SLR (-)     PATIENT SURVEYS:  FOTO 47 -> 60         HOME EXERCISE PROGRAM: Access Code: [email or web address] URL: [email or web address] Date: 08/17/2022 Prepared by: Shearon Balo   Exercises - Hooklying Isometric Clamshell  - 1 x daily - 7 x weekly - 3 sets - 10 reps - Posterior Pelvic Tilt with Adduction Using Pillow in Hooklying  - 2 x daily - 7 x weekly - 10 reps - 10 second hold - Supine Bridge  - 1 x daily - 7  x weekly - 3 sets - 10 reps   ASTERISK SIGNS     Asterisk Signs Eval (08/17/2022) 11/11  11/16         R hip ext limitation + improving           R hip abd 3+/5 w/ P! 3+ w/ minimal pain           SLS balance Sig worse R vs L   SLS                                           TREATMENT 08/31/2022:  Therapeutic Exercise: - nu-step L5 63mwhile taking subjective and planning session with patient - Thomas stretch EOB - 1.5' - S/L blue TB clam - 3x10 R - Fig 4 bridge - 2x10 - hip adduction pilates ring  - 2x10 - 5'' - lateral stepping with GTB at ankles - 5 laps - leg press - 3x10 @ 75 - step up 6'' - 10x fwd and lat   Neuromuscular re-ed: Semi tandem (100%) on foam - 441'   ASSESSMENT:   CLINICAL IMPRESSION: MBriannietolerated session well with no adverse reaction.  She is challenged significantly with tandem stance on foam; we will continue to work on this.  Her strength is improving and we were able to move to fig 4 bridge today.  Pt cued for form and pacing throughout.  Continue per POC.   OBJECTIVE IMPAIRMENTS: Pain, R hip ROM, LE and hip strength, balance   ACTIVITY LIMITATIONS: squatting, lifting, work, housework   PERSONAL FACTORS: See medical history and pertinent history     REHAB POTENTIAL: Good   CLINICAL DECISION MAKING: Stable/uncomplicated   EVALUATION COMPLEXITY: Low     GOALS:     SHORT TERM GOALS: Target date: 09/14/2022   MBraleewill be >75% HEP compliant to improve carryover between sessions and facilitate independent management of condition   Evaluation (08/17/2022): ongoing Goal status: INITIAL     LONG TERM GOALS: Target date: 10/12/2022   MAmyriahwill improve FOTO score to 60 as a proxy for functional improvement   Evaluation/Baseline (08/17/2022): 47 Goal status: INITIAL     2.  MRyiahwill self report >/= 50% decrease in pain from evaluation    Evaluation/Baseline (08/17/2022): 8/10 max pain Goal status: INITIAL     3.  MArwawill improve the following MMTs to >/= 4/5 to show improvement in strength:      Evaluation/Baseline (08/17/2022):    LE MMT:   MMT Right 08/17/2022 Left 08/17/2022  Hip flexion (L2, L3) 4* 5  Knee extension (L3) 4* 5  Knee flexion 4* 5  Hip abduction 3+* 4  Hip extension  3+* 4+  Hip external rotation      Hip internal rotation      Hip adduction      Ankle dorsiflexion (L4)      Ankle plantarflexion (S1)      Ankle inversion      Ankle eversion      Great Toe ext (L5)      Grossly        (Blank rows = not tested, score listed is out of 5 possible points.  N = WNL, D = diminished, C = clear for gross weakness with myotome testing, * = concordant pain with testing)   Goal status: INITIAL     4.  Crystall will be able to stand for >30'' in R SLS stance, to show a significant improvement in balance in order to reduce fall risk    Evaluation/Baseline (08/17/2022): 10'' significant instability compared to L Goal status: INITIAL       PLAN: PT FREQUENCY: 1-2x/week   PT DURATION: 8 weeks (Ending 10/12/2022)   PLANNED INTERVENTIONS: Therapeutic exercises, Aquatic therapy, Therapeutic activity, Neuro Muscular re-education, Gait training, Patient/Family education, Joint mobilization, Dry Needling, Electrical stimulation, Spinal mobilization and/or manipulation, Moist heat, Taping, Vasopneumatic device, Ionotophoresis '4mg'$ /ml Dexamethasone, and Manual therapy   PLAN FOR NEXT SESSION: progressive hip/LE/core, balance   Kevan Ny Brycelyn Gambino PT 08/31/2022, 9:19 AM

## 2022-09-02 ENCOUNTER — Ambulatory Visit: Payer: Medicare Other | Admitting: Physical Therapy

## 2022-09-02 ENCOUNTER — Encounter: Payer: Self-pay | Admitting: Physical Therapy

## 2022-09-02 DIAGNOSIS — R2681 Unsteadiness on feet: Secondary | ICD-10-CM | POA: Diagnosis not present

## 2022-09-02 DIAGNOSIS — M25551 Pain in right hip: Secondary | ICD-10-CM | POA: Diagnosis not present

## 2022-09-02 DIAGNOSIS — M6281 Muscle weakness (generalized): Secondary | ICD-10-CM | POA: Diagnosis not present

## 2022-09-02 DIAGNOSIS — M1610 Unilateral primary osteoarthritis, unspecified hip: Secondary | ICD-10-CM | POA: Diagnosis not present

## 2022-09-02 NOTE — Therapy (Addendum)
PHYSICAL THERAPY UNPLANNED DISCHARGE SUMMARY   Visits from Start of Care: 5  Current functional level related to goals / functional outcomes: Current status unknown   Remaining deficits: Current status unknown   Education / Equipment: Pt has not returned since visit listed below  Patient goals were not assessed. Patient is being discharged due to not returning since the last visit.  (the below note was addended to include the above D/C summary on 10/04/22)  OUTPATIENT PHYSICAL THERAPY TREATMENT NOTE   Patient Name: [name] MRN: [id number] DOB:[birth year], 66 y.o., female Today's Date: 09/02/2022  PCP: Sharion Settler, DO   REFERRING PROVIDER: Lind Covert, *   PT End of Session - 09/02/22 0815     Visit Number 5    Number of Visits --   1-2x/week   Date for PT Re-Evaluation 10/12/22    Authorization Type U[employer] MCR - FOTO    Progress Note Due on Visit 10    PT Start Time 0815    PT Stop Time 0856    PT Time Calculation (min) 41 min             Past Medical History:  Diagnosis Date   Allergy    Anxiety    Arthritis    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   Hyperlipidemia    Hyperparathyroidism ([place]) 07/25/2019   Hypertension    Normal cardiac stress test    Myoview stress test   OAB (overactive bladder) 09/01/2015   Osteopenia 05/2018   T score -1.1 FRAX 2.5%/ 0.1%   Subclinical hyperthyroidism 05/08/2007   Qualifier: Diagnosis of  By: Jerline Pain MD, Anderson Malta     Past Surgical History:  Procedure Laterality Date   COLONOSCOPY     POLYPECTOMY     TOTAL HIP ARTHROPLASTY Left 10/01/2013   DR Mayer Camel   TOTAL HIP ARTHROPLASTY Left 10/01/2013   Procedure: TOTAL HIP ARTHROPLASTY;  Surgeon: Kerin Salen, MD;  Location: [facility];  Service: Orthopedics;  Laterality: Left;   Patient Active Problem List   Diagnosis Date Noted   Urinary frequency 08/01/2022   Viral syndrome 05/01/2022   Pap smear for cervical cancer screening 01/24/2022   Loud snoring 12/19/2021   Flank mass 11/30/2021   Depressed mood 10/19/2021   Postural dizziness with near syncope 08/26/2021   BMI 37.0-37.9, adult 08/25/2021   Low serum thyroid stimulating hormone (TSH) 07/06/2020   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 05/17/2020   History of syncope 07/25/2019   Hypercalcemia 07/25/2019   Neuropathic pain 06/19/2018   Other cervical disc degeneration, unspecified cervical region 06/19/2018   Hip arthritis 10/01/2013   Vaginal discharge 09/20/2011   THYROMEGALY 12/30/2009   Hyperlipidemia 11/09/2008   Essential hypertension 01/15/2007   Morbid obesity ([place]) 12/13/2006    THERAPY DIAG:  Pain in right hip  Muscle weakness  Unsteadiness on feet   Rationale for Evaluation and Treatment Rehabilitation  REFERRING DIAG: Hip arthritis [M16.10]    PERTINENT HISTORY: L THA    PRECAUTIONS/RESTRICTIONS:   none  SUBJECTIVE:  Pt reports that she continues to have some hip pain in the morning, but overall her hip pain is improving.  Pain:  Are you having pain? Yes Pain location: R groin to posterior glute max with occasional radiation to R lateral knee NPRS scale:  current 0/10  average 6/10  Aggravating factors: sitting, rotation Relieving factors: rest Pain description: intermittent, sharp, and aching Stage: Chronic 24 hour pattern: worse in morning and with activity   OBJECTIVE: (objective measures completed at initial evaluation unless otherwise dated)  DIAGNOSTIC FINDINGS:  IMPRESSION: 1. No acute bone abnormality to the right hip. 2. Osteoarthritis along the superior aspect of the right hip joint.   SENSATION:          Light touch: Appears intact   PALPATION TTP R GT   MUSCLE  LENGTH: Hamstrings: Right subtle restriction; Left subtle restriction ASLR: Right ASLR significantly reduced compared to PSLR ; Left ASLR = PSLR Thomas test: Right subtle restriction; Left no restriction   LE MMT:   MMT Right 08/17/2022 Left 08/17/2022  Hip flexion (L2, L3) 4* 5  Knee extension (L3) 4* 5  Knee flexion 4* 5  Hip abduction 3+* 4  Hip extension 3+* 4+  Hip external rotation      Hip internal rotation      Hip adduction      Ankle dorsiflexion (L4)      Ankle plantarflexion (S1)      Ankle inversion      Ankle eversion      Great Toe ext (L5)      Grossly        (Blank rows = not tested, score listed is out of 5 possible points.  N = WNL, D = diminished, C = clear for gross weakness with myotome testing, * = concordant pain with testing)   Hip OA Test cluster (3/5 items + LR of 5.2; 4/5 items + LR of >10)   (+) scour: positive Hip IR <25 degrees: positive Hip pain while squatting: positive Active hip flexion causes lateral hip pain: positive Active hip ext is painful: equivocal       LE ROM:   ROM Right 08/17/2022 Left 08/17/2022  Hip flexion      Hip extension      Hip abduction      Hip adduction      Hip internal rotation      Hip external rotation      Knee flexion      Knee extension      Ankle dorsiflexion      Ankle plantarflexion      Ankle inversion      Ankle eversion        (Blank rows = not tested, N = WNL, * = concordant pain with testing)   Functional Tests   Eval (08/17/2022)      30'' STS: 12x  UE used? N      SLS balance significantly more difficult on R vs L                                                                                              LOWER EXTREMITY SPECIAL TESTS:  Slump (-) SLR (-)     PATIENT SURVEYS:  FOTO 47 ->  [place]: Access Code: [email or web address] URL: [email or web address] Date: 08/17/2022 Prepared by: Shearon Balo   Exercises - Hooklying  Isometric Clamshell  - 1 x daily - 7 x weekly - 3 sets - 10 reps - Posterior Pelvic Tilt with Adduction Using Pillow in Hooklying  - 2 x daily - 7 x weekly - 10 reps - 10 second hold - Supine Bridge  - 1 x daily - 7 x weekly - 3 sets - 10 reps   ASTERISK SIGNS     Asterisk Signs Eval (08/17/2022) 11/11  11/16   11/18      R hip ext limitation + improving           R hip abd 3+/5 w/ P! 3+ w/ minimal pain     3+ No pain      SLS balance Sig worse R vs L   SLS                                          TREATMENT 09/02/2022:  Therapeutic Exercise: - nu-step L5 60mwhile taking subjective and planning session with patient - Thomas stretch EOB - 1.5' - S/L blue TB clam - 3x10 R - S/L hip abd - 3x10 ea - S/L plank from knees - 10x ea - Fig 4 bridge - 2x15 - hip adduction pilates ring  - 2x10 - 5'' - leg press - 3x10 @ 85#  Neuromuscular re-ed: SLS bouts of about 20''   ASSESSMENT:   CLINICAL IMPRESSION: MKeshondatolerated session well with no adverse reaction.  We continue to progress hip strengthening with concentration on hip ext and abd.  Continue per POC.   OBJECTIVE IMPAIRMENTS: Pain, R hip ROM, LE and hip strength, balance   ACTIVITY LIMITATIONS: squatting, lifting, work, housework   PERSONAL FACTORS: See medical history and pertinent history     REHAB POTENTIAL: Good   CLINICAL DECISION MAKING: Stable/uncomplicated   EVALUATION COMPLEXITY: Low     GOALS:     SHORT TERM GOALS: Target date: 09/14/2022   MSyrenawill be >75% HEP compliant to improve carryover between sessions and facilitate independent management of condition   Evaluation (08/17/2022): ongoing Goal status: INITIAL     LONG TERM GOALS: Target date: 10/12/2022   MStellahwill improve FOTO score to 60 as a proxy for functional improvement   Evaluation/Baseline (08/17/2022): 47 Goal status: INITIAL     2.  MKushiwill self report >/= 50% decrease in pain from evaluation    Evaluation/Baseline  (08/17/2022): 8/10 max pain Goal status: INITIAL     3.  MLameshawill improve the following MMTs to >/= 4/5 to show improvement in strength:      Evaluation/Baseline (08/17/2022):    LE MMT:   MMT Right 08/17/2022 Left 08/17/2022  Hip flexion (L2, L3) 4* 5  Knee extension (L3) 4* 5  Knee flexion 4* 5  Hip abduction 3+* 4  Hip extension 3+* 4+  Hip external rotation      Hip internal rotation      Hip adduction      Ankle dorsiflexion (L4)      Ankle plantarflexion (S1)      Ankle inversion      Ankle eversion      Great Toe ext (L5)      Grossly        (  Blank rows = not tested, score listed is out of 5 possible points.  N = WNL, D = diminished, C = clear for gross weakness with myotome testing, * = concordant pain with testing)   Goal status: INITIAL     4.  Shelene will be able to stand for >30'' in R SLS stance, to show a significant improvement in balance in order to reduce fall risk    Evaluation/Baseline (08/17/2022): 10'' significant instability compared to L Goal status: INITIAL       PLAN: PT FREQUENCY: 1-2x/week   PT DURATION: 8 weeks (Ending 10/12/2022)   PLANNED INTERVENTIONS: Therapeutic exercises, Aquatic therapy, Therapeutic activity, Neuro Muscular re-education, Gait training, Patient/Family education, Joint mobilization, Dry Needling, Electrical stimulation, Spinal mobilization and/or manipulation, Moist heat, Taping, Vasopneumatic device, Ionotophoresis '4mg'$ /ml Dexamethasone, and Manual therapy   PLAN FOR NEXT SESSION: progressive hip/LE/core, balance   Kevan Ny Xzaiver Vayda PT 09/02/2022, 8:58 AM

## 2022-09-04 ENCOUNTER — Telehealth: Payer: Self-pay | Admitting: Physical Therapy

## 2022-09-04 ENCOUNTER — Ambulatory Visit: Payer: Medicare Other | Admitting: Physical Therapy

## 2022-09-04 NOTE — Telephone Encounter (Signed)
Called and informed patient of missed visit and provided reminder of next appt and attendance policy - left VM.

## 2022-09-13 ENCOUNTER — Other Ambulatory Visit: Payer: Self-pay

## 2022-09-13 ENCOUNTER — Emergency Department (HOSPITAL_BASED_OUTPATIENT_CLINIC_OR_DEPARTMENT_OTHER): Payer: Medicare Other

## 2022-09-13 ENCOUNTER — Emergency Department (HOSPITAL_BASED_OUTPATIENT_CLINIC_OR_DEPARTMENT_OTHER)
Admission: EM | Admit: 2022-09-13 | Discharge: 2022-09-13 | Disposition: A | Discharge status: Home / Self Care | Payer: Medicare Other | Attending: Emergency Medicine | Admitting: Emergency Medicine

## 2022-09-13 ENCOUNTER — Encounter (HOSPITAL_BASED_OUTPATIENT_CLINIC_OR_DEPARTMENT_OTHER): Payer: Self-pay | Admitting: Emergency Medicine

## 2022-09-13 DIAGNOSIS — Z79899 Other long term (current) drug therapy: Secondary | ICD-10-CM | POA: Diagnosis not present

## 2022-09-13 DIAGNOSIS — S01511A Laceration without foreign body of lip, initial encounter: Secondary | ICD-10-CM | POA: Insufficient documentation

## 2022-09-13 DIAGNOSIS — S01512A Laceration without foreign body of oral cavity, initial encounter: Secondary | ICD-10-CM

## 2022-09-13 DIAGNOSIS — S60229A Contusion of unspecified hand, initial encounter: Secondary | ICD-10-CM

## 2022-09-13 DIAGNOSIS — W108XXA Fall (on) (from) other stairs and steps, initial encounter: Secondary | ICD-10-CM | POA: Insufficient documentation

## 2022-09-13 DIAGNOSIS — W19XXXA Unspecified fall, initial encounter: Secondary | ICD-10-CM

## 2022-09-13 DIAGNOSIS — S0993XA Unspecified injury of face, initial encounter: Secondary | ICD-10-CM | POA: Diagnosis present

## 2022-09-13 DIAGNOSIS — S60222A Contusion of left hand, initial encounter: Secondary | ICD-10-CM | POA: Insufficient documentation

## 2022-09-13 DIAGNOSIS — S60212A Contusion of left wrist, initial encounter: Secondary | ICD-10-CM | POA: Diagnosis not present

## 2022-09-13 DIAGNOSIS — S60221A Contusion of right hand, initial encounter: Secondary | ICD-10-CM | POA: Diagnosis not present

## 2022-09-13 MED ORDER — ACETAMINOPHEN 325 MG PO TABS
650.0000 mg | ORAL_TABLET | Freq: Once | ORAL | Status: AC
  Administered 2022-09-13: 650 mg via ORAL
  Filled 2022-09-13: qty 2

## 2022-09-13 NOTE — ED Triage Notes (Signed)
Fall down back steps (2 steps) Lac on upper lip and bruising soreness of hands. Denies LOC, gcs 15 7/10 pain for HA. Took tylenol PM prior to arrival. Happened around 4pm Takes ASA daily

## 2022-09-13 NOTE — Discharge Instructions (Signed)
Get help right away if: You have severe pain. Your hand or fingers become numb. Your hand or fingers turn pale, blue, or cold. You cannot move your hand or wrist. Your hand is warm to the touch.

## 2022-09-13 NOTE — ED Notes (Signed)
Pt sitting in bed with family bedside. Pt asking about provider assessment. Updated that PA is signed up for patient and should be seeing patient shortly. Pt declines further needs.

## 2022-09-13 NOTE — ED Provider Notes (Signed)
[facility] EMERGENCY DEPT Provider Note   CSN: [id number] Arrival date & time: 09/13/22  1755     History  Chief Complaint  Patient presents with   Lytle Michaels    [name] is a 66 y.o. female who presents to the ED with mechanical fall. Patient fell down her stairs after missing a step.  She fell with both hands outstretched and hit her mouth on the ground. Her teeth are intact. She suffered a small laceration to the inside of the upper lip. She did not lose consciousness. She did not have any numbness tingling or weakness of the upper extremities.        Fall       Home Medications Prior to Admission medications   Medication Sig Start Date End Date Taking? Authorizing Provider  albuterol (PROVENTIL HFA) 108 (90 Base) MCG/ACT inhaler INHALE TWO PUFFS BY MOUTH INTO THE LUNGS EVERY 6 HOURS AS NEEDED FOR WHEEZING OR SHORTNESS OF BREATH 12/16/19   Caroline More, DO  amLODipine (NORVASC) 10 MG tablet Take 1 tablet (10 mg total) by mouth every morning. 05/13/22   Sharion Settler, DO  Budesonide 90 MCG/ACT inhaler Inhale 1 puff into the lungs 2 (two) times daily. 05/04/21   Brimage, Ronnette Juniper, DO  capsaicin (ZOSTRIX) 0.025 % cream Apply topically 2 (two) times daily. 05/23/18   Steve Rattler, DO  cholecalciferol (VITAMIN D3) 25 MCG (1000 UNIT) tablet Take 1 tablet (1,000 Units total) by mouth daily. 05/13/22   Sharion Settler, DO  fluticasone (FLONASE) 50 MCG/ACT nasal spray Place 1 spray into both nostrils daily. 05/16/22   Sharion Settler, DO  fluticasone (FLOVENT HFA) 110 MCG/ACT inhaler Inhale 1 puff into the lungs daily as needed. 05/06/21   [name], DO  gabapentin (NEURONTIN) 100 MG capsule TAKE 1 CAPSULE BY MOUTH DAILY 07/31/22   Sharion Settler, DO  loratadine (EQ ALLERGY RELIEF) 10 MG tablet Take 1 tablet (10 mg total) by mouth daily. 10/10/18   Shirley, [place], DO  losartan (COZAAR) 25 MG tablet Take 1 tablet (25 mg total) by mouth at  bedtime. 05/13/22   Sharion Settler, DO  metoprolol tartrate (LOPRESSOR) 50 MG tablet TAKE 1 TABLET BY MOUTH TWICE  DAILY 07/17/22   Sharion Settler, DO  rosuvastatin (CRESTOR) 5 MG tablet TAKE ONE TABLET BY MOUTH EVERY  MONDAY AND FRIDAY. 07/27/22   Sharion Settler, DO  sertraline (ZOLOFT) 25 MG tablet Take 1 tablet (25 mg total) by mouth daily. 05/13/22   Sharion Settler, DO      Allergies    Hctz [hydrochlorothiazide], Hydrocodone, and Simvastatin    Review of Systems   Review of Systems  Physical Exam Updated Vital Signs BP (!) 168/99   Pulse 70   Temp (!) 97.2 F (36.2 C)   Resp 17   SpO2 97%  Physical Exam Vitals and nursing note reviewed.  Constitutional:      General: She is not in acute distress.    Appearance: She is well-developed. She is not diaphoretic.  HENT:     Head: Normocephalic and atraumatic.     Right Ear: External ear normal.     Left Ear: External ear normal.     Nose: Nose normal.     Mouth/Throat:     Mouth: Mucous membranes are moist.     Comments: Teeth intact. 0.5 cm buccal laceration upper lip, does not require repair Eyes:     General: No scleral icterus.    Conjunctiva/sclera: Conjunctivae normal.  Cardiovascular:  Rate and Rhythm: Normal rate and regular rhythm.     Heart sounds: Normal heart sounds. No murmur heard.    No friction rub. No gallop.  Pulmonary:     Effort: Pulmonary effort is normal. No respiratory distress.     Breath sounds: Normal breath sounds.  Abdominal:     General: Bowel sounds are normal. There is no distension.     Palpations: Abdomen is soft. There is no mass.     Tenderness: There is no abdominal tenderness. There is no guarding.  Musculoskeletal:     Cervical back: Normal range of motion.     Comments: BL hand bruising without abrasion Full ROM of hands and fingers. TTP in the Left wrist. No swelling. No scaphoid tenderness  Skin:    General: Skin is warm and dry.  Neurological:      Mental Status: She is alert and oriented to person, place, and time.  Psychiatric:        Behavior: Behavior normal.     ED Results / Procedures / Treatments   Labs (all labs ordered are listed, but only abnormal results are displayed) Labs Reviewed - No data to display  EKG None  Radiology DG Hand Complete Right  Result Date: 09/13/2022 CLINICAL DATA:  Pain and bruising after fall EXAM: RIGHT HAND - COMPLETE 3+ VIEW COMPARISON:  None Available. FINDINGS: There is no evidence of fracture or dislocation. Chronic posttraumatic deformity about the fifth metacarpal. There is no evidence of arthropathy or other focal bone abnormality. Soft tissues are unremarkable. IMPRESSION: No acute fracture or dislocation. Electronically Signed   By: Placido Sou M.D.   On: 09/13/2022 22:10   DG Wrist Complete Left  Result Date: 09/13/2022 CLINICAL DATA:  Bruising and hands after fall EXAM: LEFT WRIST - COMPLETE 3+ VIEW; LEFT HAND - COMPLETE 3+ VIEW COMPARISON:  None available FINDINGS: There is no evidence of fracture or dislocation. There is no evidence of arthropathy or other focal bone abnormality. Soft tissue swelling about the wrist. IMPRESSION: No acute fracture or dislocation. Electronically Signed   By: Placido Sou M.D.   On: 09/13/2022 22:09   DG Hand Complete Left  Result Date: 09/13/2022 CLINICAL DATA:  Bruising and hands after fall EXAM: LEFT WRIST - COMPLETE 3+ VIEW; LEFT HAND - COMPLETE 3+ VIEW COMPARISON:  None available FINDINGS: There is no evidence of fracture or dislocation. There is no evidence of arthropathy or other focal bone abnormality. Soft tissue swelling about the wrist. IMPRESSION: No acute fracture or dislocation. Electronically Signed   By: Placido Sou M.D.   On: 09/13/2022 22:09    Procedures Procedures    Medications Ordered in ED Medications  acetaminophen (TYLENOL) tablet 650 mg (650 mg Oral Given 09/13/22 2113)    ED Course/ Medical Decision  Making/ A&P                           Medical Decision Making Patient here with fall.  She denies syncope. Her images are negative.  Lac does not need to be repaired.  Patient appears appropriate for dc  Amount and/or Complexity of Data Reviewed Radiology: ordered and independent interpretation performed.  Risk OTC drugs.           Final Clinical Impression(s) / ED Diagnoses Final diagnoses:  Fall, initial encounter  Laceration of mouth, initial encounter  Contusion of hand, unspecified laterality, initial encounter    Rx / DC Orders ED Discharge  Orders     None         Margarita Mail, PA-C 09/14/22 2109    Elgie Congo, MD 09/15/22 [phone]

## 2022-09-14 ENCOUNTER — Telehealth: Payer: Self-pay

## 2022-09-14 ENCOUNTER — Ambulatory Visit: Payer: Medicare Other | Admitting: Physical Therapy

## 2022-09-14 ENCOUNTER — Ambulatory Visit (INDEPENDENT_AMBULATORY_CARE_PROVIDER_SITE_OTHER): Payer: Medicare Other | Admitting: Family Medicine

## 2022-09-14 VITALS — BP 138/82 | HR 91 | Ht 62.0 in | Wt 201.6 lb

## 2022-09-14 DIAGNOSIS — I10 Essential (primary) hypertension: Secondary | ICD-10-CM

## 2022-09-14 DIAGNOSIS — W108XXD Fall (on) (from) other stairs and steps, subsequent encounter: Secondary | ICD-10-CM | POA: Diagnosis not present

## 2022-09-14 NOTE — Patient Outreach (Signed)
  Care Coordination TOC Note Transition Care Management Unsuccessful Follow-up Telephone Call  Date of discharge and from where:  [name] 09/13/22  Attempts:  1st Attempt  Reason for unsuccessful TCM follow-up call:  No answer/busy- Voicemail not identified.  Johnney Killian, RN, BSN, CCM Care Management Coordinator [facility] Phone: [phone]: [phone]

## 2022-09-14 NOTE — Patient Instructions (Addendum)
It was wonderful to see you today.  Please bring ALL of your medications with you to every visit.   Today we talked about:  Stay hydrated. If your symptoms recur it would certainly be worth investigating further. I don't think your medications are causing this. I don't think at this time there is any more work up to be done since you feel back to normal. Im glad that you are feeling better and that your x-rays were normal.  Thank you for coming to your visit as scheduled. We have had a large "no-show" problem lately, and this significantly limits our ability to see and care for patients. As a friendly reminder- if you cannot make your appointment please call to cancel. We do have a no show policy for those who do not cancel within 24 hours. Our policy is that if you miss or fail to cancel an appointment within 24 hours, 3 times in a 42-monthperiod, you may be dismissed from our clinic.   Thank you for choosing [facility]   Please call [phone] any questions about today's appointment.  Please be sure to schedule follow up at the front  desk before you leave today.   ASharion Settler DO PGY-3 Family Medicine

## 2022-09-14 NOTE — Patient Outreach (Signed)
Opened in error

## 2022-09-14 NOTE — Telephone Encounter (Signed)
Patient calls nurse line reporting a fall yesterday.   She reports she fell down her back steps. She reports a laceration on her lip and soreness on her hands. Denies LOC.  She reports she went to the ED yesterday, however reports a long wait to see provider.   Patient scheduled for this afternoon for evaluation.

## 2022-09-14 NOTE — Assessment & Plan Note (Signed)
Initially elevated BP, improved on recheck. No orthostasis with standing BP check. Will leave medications as is at this time to avoid possible hypotension given patients hx of recurrent falls.

## 2022-09-14 NOTE — Assessment & Plan Note (Addendum)
Patient is following up from recent fall down steps. Upon further discussion she thinks that she missed one step and this caused her to fall down two steps. She fell and used both hands to break the fall. This makes me less concerned for acute cardiac pathologies causing fall. No LOC and she feels back to her baseline at this time. She does have abrasion to nose and small lip laceration that should heal well. -Symptomatic tx with Tylenol as needed for pain -Push fluids, encourage regular meal intakes -Return for any palpitations, CP, additional falls, dizziness

## 2022-09-14 NOTE — Progress Notes (Signed)
    SUBJECTIVE:   CHIEF COMPLAINT / HPI:   [name] is a 66 y.o. female who presents to the [facility] clinic today to discuss the following concerns:   ED Follow up: Fall Patient reports falling down her back steps yesterday. She did present to the ED and had negative hand x-rays.  She reports that she felt dizzy for a split second. She thinks that she missed the last two steps. She fell forward and has a slight nose abrasion and lip laceration. She has been taking Tylenol for her pain. No LOC, vision changes, c-spine tenderness. She does endorse a slight headache. She is not on anti-coagulation. She feels back to her baseline today.   She has been evaluated by Cardiology in the past (last seen 10/2021) for her postural dizziness. It was thought that her symptoms were not cardiac in etiology. She has worn an event recorder in the past which documented SR with mild prolongation of PP interval with one previous episode.   PERTINENT  PMH / PSH: Postural dizziness with near-syncope, HTN, hypercalcemia  OBJECTIVE:   BP 138/82 (BP Location: Right Arm, Patient Position: Standing)   Pulse 91   Ht '5\' 2"'$  (1.575 m)   Wt 201 lb 9.6 oz (91.4 kg)   SpO2 97%   BMI 36.87 kg/m   Vitals:   09/14/22 1434 09/14/22 1519  BP: (!) 148/76 138/82 (standing)  Pulse: 91   SpO2: 97%    General: NAD, pleasant, able to participate in exam Cardiac: RRR, no murmurs. Respiratory: CTAB, normal effort, No wheezes, rales or rhonchi Skin: warm and dry, no rashes noted. Small nose abrasion and upper lip laceration and swelling Neuro: alert, speech is clear, normal strength, mildly antalgic gait, no focal weakness Psych: Normal affect and mood       ASSESSMENT/PLAN:   Essential hypertension Initially elevated BP, improved on recheck. No orthostasis with standing BP check. Will leave medications as is at this time to avoid possible hypotension given patients hx of recurrent falls.   Fall (on) (from) other  stairs and steps, subsequent encounter Patient is following up from recent fall down steps. Upon further discussion she thinks that she missed one step and this caused her to fall down two steps. She fell and used both hands to break the fall. This makes me less concerned for acute cardiac pathologies causing fall. No LOC and she feels back to her baseline at this time. She does have abrasion to nose and small lip laceration that should heal well. -Symptomatic tx with Tylenol as needed for pain -Push fluids, encourage regular meal intakes -Return for any palpitations, CP, additional falls, dizziness    Sharion Settler, [place]

## 2022-09-18 ENCOUNTER — Telehealth: Payer: Self-pay | Admitting: *Deleted

## 2022-09-18 ENCOUNTER — Ambulatory Visit (INDEPENDENT_AMBULATORY_CARE_PROVIDER_SITE_OTHER): Payer: Medicare Other

## 2022-09-18 DIAGNOSIS — W108XXD Fall (on) (from) other stairs and steps, subsequent encounter: Secondary | ICD-10-CM

## 2022-09-18 DIAGNOSIS — Z23 Encounter for immunization: Secondary | ICD-10-CM

## 2022-09-18 NOTE — Telephone Encounter (Signed)
        Patient  visited [facility] on 09/13/2022  for treatment    Telephone encounter attempt :  1st  A HIPAA compliant voice message was left requesting a return call.  Instructed patient to call back at [phone]. [place] [phone] [address] , [place] [address] Email : [email or web address]

## 2022-09-19 NOTE — Progress Notes (Signed)
Patient presents to nurse clinic for Tdap vaccination. Patient had recent fall with lip laceration and was not up to date on Tdap. Received verbal order from Dr. Nita Sells to administer.   Administered in RD, site unremarkable, tolerated injection well.   Talbot Grumbling, RN

## 2022-10-04 ENCOUNTER — Telehealth: Payer: Self-pay

## 2022-10-04 NOTE — Telephone Encounter (Signed)
        Patient  visited [facility] on 09/13/2022  for fall, initial encounter, laceration.   Telephone encounter attempt :  1st  A HIPAA compliant voice message was left requesting a return call.  Instructed patient to call back at [phone].   [facility] Resource Care Guide   [email or web address]  ?? [phone]   Website: [email or web address]  [email or web address]

## 2022-11-01 ENCOUNTER — Encounter: Payer: Self-pay | Admitting: Gastroenterology

## 2022-11-16 ENCOUNTER — Encounter: Payer: Self-pay | Admitting: Family Medicine

## 2022-11-17 ENCOUNTER — Other Ambulatory Visit: Payer: Self-pay | Admitting: Family Medicine

## 2022-11-17 DIAGNOSIS — R413 Other amnesia: Secondary | ICD-10-CM

## 2022-11-17 NOTE — Progress Notes (Signed)
Placing referral for Neurology per daughters request for concerns regarding [name]'s progressive memory loss.

## 2022-11-28 ENCOUNTER — Ambulatory Visit: Payer: 59 | Admitting: Family Medicine

## 2022-11-28 NOTE — Patient Instructions (Incomplete)
It was wonderful to see you today.  Please bring ALL of your medications with you to every visit.   Today we talked about:  **  Thank you for coming to your visit as scheduled. We have had a large "no-show" problem lately, and this significantly limits our ability to see and care for patients. As a friendly reminder- if you cannot make your appointment please call to cancel. We do have a no show policy for those who do not cancel within 24 hours. Our policy is that if you miss or fail to cancel an appointment within 24 hours, 3 times in a 6-month period, you may be dismissed from our clinic.   Thank you for choosing [facility].   Please call [phone] with any questions about today's appointment.  Please be sure to schedule follow up at the front  desk before you leave today.   Keiondra Brookover, DO PGY-3 Family Medicine   

## 2022-11-28 NOTE — Progress Notes (Deleted)
    SUBJECTIVE:   CHIEF COMPLAINT / HPI:   [name] is a 67 y.o. female who presents to the [facility] clinic today to discuss the following concerns:   Right Hip Pain   PERTINENT  PMH / PSH: HTN, HLD, depressed mood, concerns re: memory   OBJECTIVE:   There were no vitals taken for this visit.   General: NAD, pleasant, able to participate in exam Cardiac: RRR, no murmurs. Respiratory: CTAB, normal effort, No wheezes, rales or rhonchi Abdomen: Bowel sounds present, nontender, nondistended, no hepatosplenomegaly. Extremities: no edema or cyanosis. Skin: warm and dry, no rashes noted Neuro: alert, no obvious focal deficits Psych: Normal affect and mood  ASSESSMENT/PLAN:   No problem-specific Assessment & Plan notes found for this encounter.     Sharion Settler, [place]

## 2022-11-30 ENCOUNTER — Encounter: Payer: Self-pay | Admitting: Gastroenterology

## 2023-01-04 ENCOUNTER — Encounter: Payer: 59 | Admitting: Gastroenterology

## 2023-01-08 DIAGNOSIS — R413 Other amnesia: Secondary | ICD-10-CM | POA: Diagnosis not present

## 2023-01-23 ENCOUNTER — Other Ambulatory Visit: Payer: Self-pay | Admitting: Family Medicine

## 2023-01-25 DIAGNOSIS — R413 Other amnesia: Secondary | ICD-10-CM | POA: Diagnosis not present

## 2023-01-26 DIAGNOSIS — G319 Degenerative disease of nervous system, unspecified: Secondary | ICD-10-CM | POA: Diagnosis not present

## 2023-01-26 DIAGNOSIS — R413 Other amnesia: Secondary | ICD-10-CM | POA: Diagnosis not present

## 2023-02-08 ENCOUNTER — Other Ambulatory Visit: Payer: Self-pay | Admitting: Family Medicine

## 2023-02-08 DIAGNOSIS — R4589 Other symptoms and signs involving emotional state: Secondary | ICD-10-CM

## 2023-02-09 DIAGNOSIS — R413 Other amnesia: Secondary | ICD-10-CM | POA: Diagnosis not present

## 2023-02-14 ENCOUNTER — Other Ambulatory Visit: Payer: Self-pay | Admitting: Family Medicine

## 2023-02-21 ENCOUNTER — Telehealth: Payer: Self-pay | Admitting: Family Medicine

## 2023-02-21 NOTE — Telephone Encounter (Signed)
Called patient to schedule Medicare Annual Wellness Visit (AWV). Left message for patient to call back and schedule Medicare Annual Wellness Visit (AWV).  Last date of AWV: 09/02/2020   Please schedule an AWVS appointment at any time with [facility] VISIT.  If any questions, please contact me at [phone].    Thank you,  [facility] Support [facility] Direct dial  [phone]

## 2023-03-06 ENCOUNTER — Other Ambulatory Visit: Payer: Self-pay | Admitting: Family Medicine

## 2023-03-21 ENCOUNTER — Ambulatory Visit: Payer: 59 | Admitting: Family Medicine

## 2023-03-22 ENCOUNTER — Other Ambulatory Visit: Payer: Self-pay

## 2023-03-22 ENCOUNTER — Encounter: Payer: Self-pay | Admitting: Family Medicine

## 2023-03-22 ENCOUNTER — Ambulatory Visit (INDEPENDENT_AMBULATORY_CARE_PROVIDER_SITE_OTHER): Payer: 59 | Admitting: Family Medicine

## 2023-03-22 VITALS — BP 136/76 | HR 63 | Ht 62.0 in | Wt 198.6 lb

## 2023-03-22 DIAGNOSIS — F4321 Adjustment disorder with depressed mood: Secondary | ICD-10-CM | POA: Diagnosis not present

## 2023-03-22 DIAGNOSIS — M1610 Unilateral primary osteoarthritis, unspecified hip: Secondary | ICD-10-CM | POA: Diagnosis not present

## 2023-03-22 MED ORDER — MELOXICAM 15 MG PO TABS: 15.0000 mg | ORAL_TABLET | Freq: Every day | ORAL | 0 refills | Status: DC

## 2023-03-22 NOTE — Assessment & Plan Note (Signed)
Grieving the loss of her husband 1 month ago. 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 No active SI. Encouraged her to seek grief counseling through [facility] (# provided in AVS). Crisis resources provided in AVS as well. Follow up in 1 month to check-in.

## 2023-03-22 NOTE — Patient Instructions (Addendum)
It was great to meet you!  For your hip pain: I sent a prescription anti-inflammatory/pain medication called meloxicam to your pharmacy.  Take it once daily x 10 days.  Do not use ibuprofen, Motrin, Advil, Aleve, or naproxen while taking this.  If you need something additional for pain you may use Tylenol.  I have also placed a referral to the orthopedic specialist.  Someone will call you for an appointment but it may take several weeks.  In the meantime I would like you to complete an additional round of physical therapy.  I have placed a referral for this as well.  For grief counseling, I highly recommend you reach out to [facility] hospice for their grief support and resources.  [phone] is their phone number.  Follow-up in 1 month to check in regarding your mood.  If you are ever in crisis, see below for 24/7 availability [facility]  [address] [place], [place] [phone] Crisis [phone]  Suicide hotline 988  Take care, Dr. Anner Crete

## 2023-03-22 NOTE — Assessment & Plan Note (Signed)
>>  ASSESSMENT AND PLAN FOR HIP ARTHRITIS WRITTEN ON 03/22/2023  8:00 PM BY MALVINA ELLEN, MD  Patient with known right hip OA based on prior x-rays. Worsening. Now interfering with daily function. Completed PT without significant improvement. -Meloxicam  15mg  daily x10 days, then as needed -Referral to ortho (Dr Liam did her L hip arthroplasty in 2014) -Referral for additional round of PT in the meantime

## 2023-03-22 NOTE — Assessment & Plan Note (Signed)
Patient with known right hip OA based on prior x-rays. Worsening. Now interfering with daily function. Completed PT without significant improvement. -Meloxicam 15mg  daily x10 days, then as needed -Referral to ortho (Dr Turner Daniels did her L hip arthroplasty in 2014) -Referral for additional round of PT in the meantime

## 2023-03-22 NOTE — Progress Notes (Signed)
    SUBJECTIVE:   CHIEF COMPLAINT / HPI:   Right Hip Pain Has been gradually worsening over several months. Past few days seems to be especially bad. No trauma or injury that she's aware of. She describes sharp pain in her anterior hip. Pain is intermittent. Worse when moving. Reports she had a left hip replacement several years ago (2014) and this feels similar to how her left hip felt prior to the surgery. She did physical therapy ~8 months ago without significant improvement.   Grief PHQ-9 elevated today. When patient was asked about this, she reports her husband died last month. Erasmo Score was a few weeks ago. She feels generally sad. Trying to take things one step at a time. Some thoughts she'd rather not be here but no active SI, no intent or plan. Husband was receiving hospice services through [facility]- she plans to reach out to them for grief counseling.  PERTINENT  PMH / PSH: HTN  OBJECTIVE:   BP 136/76   Pulse 63   Ht 5\' 2"  (1.575 m)   Wt 198 lb 9.6 oz (90.1 kg)   SpO2 100%   BMI 36.32 kg/m   General: NAD, pleasant, able to participate in exam Respiratory: No respiratory distress Skin: warm and dry, no rashes noted Psych: appropriately groomed, normal eye contact, normal affect and mood, normal speech Neuro: grossly intact MSK: right hip-- inspection is unremarkable without deformity, there is an area of tenderness to deep palpation in right anterior hip, no tenderness over greater trochanter, ROM intact but painful especially with flexion. +FABER, pain with log roll. Antalgic gait. Knee exam unremarkable Back: nontender, FROM  ASSESSMENT/PLAN:   Hip arthritis Patient with known right hip OA based on prior x-rays. Worsening. Now interfering with daily function. Completed PT without significant improvement. -Meloxicam 15mg  daily x10 days, then as needed -Referral to ortho (Dr Turner Daniels did her L hip arthroplasty in 2014) -Referral for additional round of PT in the  meantime  Grief reaction Grieving the loss of her husband 1 month ago. 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 No active SI. Encouraged her to seek grief counseling through [facility] (# provided in AVS). Crisis resources provided in AVS as well. Follow up in 1 month to check-in.     Maury Dus, MD [facility]

## 2023-04-05 DIAGNOSIS — M1611 Unilateral primary osteoarthritis, right hip: Secondary | ICD-10-CM | POA: Diagnosis not present

## 2023-04-12 DIAGNOSIS — M1611 Unilateral primary osteoarthritis, right hip: Secondary | ICD-10-CM | POA: Diagnosis not present

## 2023-04-13 DIAGNOSIS — M1611 Unilateral primary osteoarthritis, right hip: Secondary | ICD-10-CM | POA: Diagnosis not present

## 2023-04-16 ENCOUNTER — Other Ambulatory Visit: Payer: Self-pay | Admitting: Family Medicine

## 2023-04-16 ENCOUNTER — Ambulatory Visit (INDEPENDENT_AMBULATORY_CARE_PROVIDER_SITE_OTHER): Payer: 59 | Admitting: Family Medicine

## 2023-04-16 ENCOUNTER — Encounter: Payer: Self-pay | Admitting: Family Medicine

## 2023-04-16 VITALS — BP 132/78 | HR 75 | Wt 197.0 lb

## 2023-04-16 DIAGNOSIS — N6321 Unspecified lump in the left breast, upper outer quadrant: Secondary | ICD-10-CM

## 2023-04-16 DIAGNOSIS — H6990 Unspecified Eustachian tube disorder, unspecified ear: Secondary | ICD-10-CM

## 2023-04-16 DIAGNOSIS — F4321 Adjustment disorder with depressed mood: Secondary | ICD-10-CM | POA: Diagnosis not present

## 2023-04-16 NOTE — Patient Instructions (Signed)
It was great to see you!  Our plans for today:  - We ordered a mammogram to take a closer look at your breast lump. They will call you to schedule or you can call [phone].   Take care and seek immediate care sooner if you develop any concerns.   Dr. Linwood Dibbles

## 2023-04-16 NOTE — Assessment & Plan Note (Signed)
Continues to maintain adequate coping skills and support system. Pursuing grief counseling through hospice. Offered support.

## 2023-04-16 NOTE — Progress Notes (Signed)
    SUBJECTIVE:   CHIEF COMPLAINT / HPI:   BREAST LUMP - noticing lump in L outer breast for the past 2 months. Sometimes tender to touch - denies redness, swelling, trauma, nipple discharge - is postmenopausal - last mammogram normal 12/2019 - paternal grandmother with breast cancer in 45s.  Grief - significant other passed away 03-06-23 from heart and kidney failure. Was caretaker. 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  - is pursuing grief counseling through hospice. - was prior chaplain.  OBJECTIVE:   BP 132/78   Pulse 75   Wt 197 lb (89.4 kg)   SpO2 98%   BMI 36.03 kg/m   Gen: well appearing, in NAD Breasts: symmetric fibrous changes in both upper outer quadrants, right breast normal without mass, skin or nipple changes or axillary nodes, L breast with nonmobile ~2cm lump in outer breast.   ASSESSMENT/PLAN:   Breast lump Will obtain diagnostic mammogram.   Grief reaction Continues to maintain adequate coping skills and support system. Pursuing grief counseling through hospice. Offered support.    Caro Laroche, DO

## 2023-04-17 DIAGNOSIS — M1611 Unilateral primary osteoarthritis, right hip: Secondary | ICD-10-CM | POA: Diagnosis not present

## 2023-04-20 DIAGNOSIS — M1611 Unilateral primary osteoarthritis, right hip: Secondary | ICD-10-CM | POA: Diagnosis not present

## 2023-04-23 DIAGNOSIS — M1611 Unilateral primary osteoarthritis, right hip: Secondary | ICD-10-CM | POA: Diagnosis not present

## 2023-04-24 ENCOUNTER — Ambulatory Visit: Payer: Self-pay | Admitting: Student

## 2023-04-24 NOTE — Progress Notes (Deleted)
    SUBJECTIVE:   CHIEF COMPLAINT / HPI: Grief f/u  ***  PERTINENT  PMH / PSH: ***  OBJECTIVE:   There were no vitals taken for this visit.  ***  ASSESSMENT/PLAN:   No problem-specific Assessment & Plan notes found for this encounter.     Levin Erp, MD [facility]

## 2023-05-03 ENCOUNTER — Ambulatory Visit (INDEPENDENT_AMBULATORY_CARE_PROVIDER_SITE_OTHER): Payer: 59 | Admitting: Student

## 2023-05-03 ENCOUNTER — Other Ambulatory Visit: Payer: Self-pay

## 2023-05-03 VITALS — BP 157/99 | HR 73 | Ht 62.0 in | Wt 200.0 lb

## 2023-05-03 DIAGNOSIS — M1610 Unilateral primary osteoarthritis, unspecified hip: Secondary | ICD-10-CM | POA: Diagnosis not present

## 2023-05-03 DIAGNOSIS — I10 Essential (primary) hypertension: Secondary | ICD-10-CM | POA: Diagnosis not present

## 2023-05-03 NOTE — Patient Instructions (Addendum)
It was great to see you! Thank you for allowing me to participate in your care!    Our plans for today:  - Call to schedule appointment: [facility] Address: [address] [place], [place] [address] Phone: [phone] -Continue meloxicam as needed  -You can also take Tylenol 1000 mg every 8 hours as needed.  -Your blood pressure is elevated which may be due to pain.  Please return when your pain is better controlled for follow-up visit for blood pressure    Take care and seek immediate care sooner if you develop any concerns.   Dr. Erick Alley, DO [facility]

## 2023-05-03 NOTE — Progress Notes (Unsigned)
    SUBJECTIVE:   CHIEF COMPLAINT / HPI:   R hip pain Present for several months and worsening.  Describes pain located at anterior hip and radiates down anterior and lateral thigh R hip xray 08/03/22 with evidence of OA Hx of L hip replacement for OA Last seen at [facility] on 03/22/2023 for the same pain and was diagnosed with arthritis. Was referred to PT, has had about 3-4 session but this seems to have worsened pain.  She was also referred to ortho but did not receive call for apt.  Was prescribed meloxicam which helped a little. She still has some to take as needed.  She does feel that the meloxicam is controlling her pain well enough at this time until she can get in with Ortho.  HTN Did take BP meds today. No chest pain, SOB, changes in vision. Thinks BP in elevated in setting of hip pain.   PERTINENT  PMH / PSH: Knee OA, HTN, neuropathic pain, obesity  OBJECTIVE:   BP (!) 157/99   Pulse 73   Ht 5\' 2"  (1.575 m)   Wt 200 lb (90.7 kg)   SpO2 100%   BMI 36.58 kg/m    General: NAD, pleasant, able to participate in exam Cardiac: Well-perfused Respiratory: Breathing comfortably on room air MSK: TTP of anterior R hip and lateral and anterior thigh. 5/5 muscle strength of BLEs. Pain with movement of R hip in all planes of motion, faber test positive for R hip Neuro: alert, no obvious focal deficits Psych: Normal affect and mood  ASSESSMENT/PLAN:   Hip arthritis Plan per AVS: - Call to schedule appointment: [facility] Address: [address] [place], [place] [address] Phone: [phone] -Continue meloxicam as needed  -You can also take Tylenol 1000 mg every 8 hours as needed.   Essential hypertension -Continue current regimen -Discussed checking BP at home (has cuff) and bringing recordings to next visit when pain is better controlled      Dr. Erick Alley, DO [place] [facility]

## 2023-05-05 DIAGNOSIS — M1611 Unilateral primary osteoarthritis, right hip: Secondary | ICD-10-CM | POA: Insufficient documentation

## 2023-05-05 NOTE — Assessment & Plan Note (Signed)
Plan per AVS: - Call to schedule appointment: [facility] Address: [address] [place], [place] [address] Phone: [phone] -Continue meloxicam as needed  -You can also take Tylenol 1000 mg every 8 hours as needed.

## 2023-05-05 NOTE — Assessment & Plan Note (Signed)
-  Continue current regimen -Discussed checking BP at home (has cuff) and bringing recordings to next visit when pain is better controlled

## 2023-05-05 NOTE — Assessment & Plan Note (Signed)
>>  ASSESSMENT AND PLAN FOR HIP ARTHRITIS WRITTEN ON 05/05/2023  8:36 AM BY [name], DO  Plan per AVS: - Call to schedule appointment: [facility] Address: [address] [place], [place] [address] Phone: [phone] -Continue meloxicam  as needed  -You can also take Tylenol  1000 mg every 8 hours as needed.

## 2023-05-13 NOTE — Progress Notes (Deleted)
Subjective:   [name] is a 67 y.o. female who presents for Medicare Annual (Subsequent) preventive examination.  Visit Complete: {VISITMETHOD:[phone]}  Patient Medicare AWV questionnaire was completed by the patient on ***; I have confirmed that all information answered by patient is correct and no changes since this date.  Review of Systems    ***   Vital Signs: {telehealth vitals:30100}     Objective:    There were no vitals filed for this visit. There is no height or weight on file to calculate BMI.     05/03/2023   11:29 AM 04/16/2023   10:56 AM 09/14/2022    2:24 PM 09/13/2022    6:12 PM 08/17/2022    7:06 AM 08/01/2022    3:26 PM 05/01/2022   10:08 AM  Advanced Directives  Does Patient Have a Medical Advance Directive? No No No No No No No  Would patient like information on creating a medical advance directive? No - Patient declined No - Patient declined No - Patient declined No - Patient declined No - Patient declined No - Patient declined No - Patient declined    Current Medications (verified) Outpatient Encounter Medications as of 05/14/2023  Medication Sig   albuterol (PROVENTIL HFA) 108 (90 Base) MCG/ACT inhaler INHALE TWO PUFFS BY MOUTH INTO THE LUNGS EVERY 6 HOURS AS NEEDED FOR WHEEZING OR SHORTNESS OF BREATH   amLODipine (NORVASC) 10 MG tablet TAKE 1 TABLET BY MOUTH EVERY  MORNING   Budesonide 90 MCG/ACT inhaler Inhale 1 puff into the lungs 2 (two) times daily.   capsaicin (ZOSTRIX) 0.025 % cream Apply topically 2 (two) times daily.   cholecalciferol (VITAMIN D3) 25 MCG (1000 UNIT) tablet Take 1 tablet (1,000 Units total) by mouth daily.   fluticasone (FLONASE) 50 MCG/ACT nasal spray USE 1 SPRAY IN BOTH NOSTRILS  DAILY   fluticasone (FLOVENT HFA) 110 MCG/ACT inhaler Inhale 1 puff into the lungs daily as needed.   gabapentin (NEURONTIN) 100 MG capsule TAKE 1 CAPSULE BY MOUTH DAILY   loratadine (EQ ALLERGY RELIEF) 10 MG tablet Take 1 tablet (10 mg  total) by mouth daily.   losartan (COZAAR) 25 MG tablet TAKE 1 TABLET BY MOUTH AT  BEDTIME   meloxicam (MOBIC) 15 MG tablet Take 1 tablet (15 mg total) by mouth daily.   metoprolol tartrate (LOPRESSOR) 50 MG tablet TAKE 1 TABLET BY MOUTH TWICE  DAILY   rosuvastatin (CRESTOR) 5 MG tablet TAKE ONE TABLET BY MOUTH EVERY  MONDAY AND FRIDAY.   sertraline (ZOLOFT) 25 MG tablet TAKE 1 TABLET BY MOUTH DAILY (Patient not taking: Reported on 05/03/2023)   No facility-administered encounter medications on file as of 05/14/2023.    Allergies (verified) Hctz [hydrochlorothiazide], Hydrocodone, and Simvastatin   History: Past Medical History:  Diagnosis Date   Allergy    Anxiety    Arthritis    degenerative in back and hips   Atypical chest pain 05/03/2015   Low risk Myoview 2016, normal LVF by echo Dec 2020   Carpal tunnel syndrome, bilateral    Chest pain    Elevated PTHrP level 06/19/2018   Syncope   Feeling grief 12/30/2015   Heart murmur    age 40 said had heart murmur.   Hyperlipidemia    Hyperparathyroidism (HCC) 07/25/2019   Hypertension    Normal cardiac stress test    Myoview stress test   OAB (overactive bladder) 09/01/2015   Osteopenia 05/2018   T score -1.1 FRAX 2.5%/ 0.1%  Subclinical hyperthyroidism 05/08/2007   Qualifier: Diagnosis of  By: Jimmey Ralph MD, Victorino Dike     Past Surgical History:  Procedure Laterality Date   COLONOSCOPY     POLYPECTOMY     TOTAL HIP ARTHROPLASTY Left 10/01/2013   DR Turner Daniels   TOTAL HIP ARTHROPLASTY Left 10/01/2013   Procedure: TOTAL HIP ARTHROPLASTY;  Surgeon: [name], MD;  Location: [facility] OR;  Service: Orthopedics;  Laterality: Left;   Family History  Problem Relation Age of Onset   Hypertension Mother    Diabetes Father    Hyperlipidemia Brother    Breast cancer Paternal Grandmother    Heart attack Neg Hx    Sudden death Neg Hx    Colon cancer Neg Hx    Hypercalcemia Neg Hx    Social History   Socioeconomic History   Marital status:  Divorced    Spouse name: Not on file   Number of children: 3   Years of education: 18   Highest education level: Manufacturing engineer (e.g., MA, MS, MEng, MEd, MSW, MBA)  Occupational History   Occupation: Control and instrumentation engineer: [employer]    Comment: pt is an Event organiser for the parishioner's service at [employer]   Tobacco Use   Smoking status: Never    Passive exposure: Never   Smokeless tobacco: Never  Vaping Use   Vaping status: Never Used  Substance and Sexual Activity   Alcohol use: No    Alcohol/week: 0.0 standard drinks of alcohol   Drug use: No   Sexual activity: Yes    Partners: Male    Birth control/protection: Post-menopausal  Other Topics Concern   Not on file  Social History Narrative   Patient lives alone in [place].    Patient has 3 children and has grandchildren.    Patient enjoys reading, spending time with her family, and very involved in her church.    Social Determinants of Health   Financial Resource Strain: Patient Declined (01/08/2023)   Received from [facility]   Overall Financial Resource Strain (CARDIA)    Difficulty of Paying Living Expenses: Patient declined  Food Insecurity: Patient Declined (01/08/2023)   Received from [facility], [facility]   Hunger Vital Sign    Worried About Running Out of Food in the Last Year: Patient declined    Ran Out of Food in the Last Year: Patient declined  Transportation Needs: Patient Declined (01/08/2023)   Received from [facility], [facility]   [facility] - Transportation    Lack of Transportation (Medical): Patient declined    Lack of Transportation (Non-Medical): Patient declined  Physical Activity: Unknown (01/07/2023)   Received from [facility], [facility]   Exercise Vital Sign    Days of Exercise per Week: 0 days    Minutes of Exercise per Session: Not on file  Stress: No Stress Concern Present (09/02/2020)   [employer] of Occupational Health -  Occupational Stress Questionnaire    Feeling of Stress : Only a little  Social Connections: Moderately Integrated (01/07/2023)   Received from [facility], [facility]   Social Network    How would you rate your social network (family, work, friends)?: Adequate participation with social networks    Tobacco Counseling Counseling given: Not Answered   Clinical Intake:                        Activities of Daily Living     No  data to display          Patient Care Team: Erick Alley, DO as PCP - General (Family Medicine) Runell Gess, MD as PCP - Cardiology (Cardiology) York Spaniel, MD (Inactive) as Consulting Physician (Neurology)  Indicate any recent Medical Services you may have received from other than [facility] providers in the past year (date may be approximate).     Assessment:   This is a routine wellness examination for [name].  Hearing/Vision screen No results found.  Dietary issues and exercise activities discussed:     Goals Addressed   None    Depression Screen    05/03/2023   11:29 AM 04/16/2023   10:57 AM 03/22/2023   10:25 AM 09/14/2022    2:36 PM 08/01/2022    4:20 PM 05/01/2022   10:08 AM 02/09/2022    2:06 PM  PHQ 2/9 Scores  PHQ - 2 Score 4 6 3 3  0 0 0  PHQ- 9 Score 15 24 10 5  0  2    Fall Risk    05/03/2023   11:29 AM 04/16/2023   10:57 AM 03/22/2023   10:25 AM 09/14/2022    2:24 PM 05/01/2022   10:08 AM  Fall Risk   Falls in the past year? 0 0 0 1 0  Number falls in past yr: 0 0 0 0   Injury with Fall? 0 0 0    Risk for fall due to :  Impaired balance/gait     Follow up  Falls prevention discussed       MEDICARE RISK AT HOME:   TIMED UP AND GO:  Was the test performed?  No    Cognitive Function:    01/13/2021    8:54 AM 10/12/2020    8:17 AM  MMSE - Mini Mental State Exam  Orientation to time 3 5  Orientation to Place 5 5  Registration 3 3  Attention/ Calculation 2 2  Recall 3 3  Language- name 2  objects 2 2  Language- repeat 1 1  Language- follow 3 step command 3 3  Language- read & follow direction 1 1  Write a sentence 1 1  Copy design 1 1  Total score 25 27      02/10/2022   12:32 PM  Montreal Cognitive Assessment   Visuospatial/ Executive (0/5) 5  Naming (0/3) 3  Attention: Read list of digits (0/2) 2  Attention: Read list of letters (0/1) 1  Attention: Serial 7 subtraction starting at 100 (0/3) 3  Language: Repeat phrase (0/2) 1  Language : Fluency (0/1) 1  Abstraction (0/2) 2  Delayed Recall (0/5) 0  Orientation (0/6) 6  Total 24  Adjusted Score (based on education) 24      09/02/2020   10:39 AM  6CIT Screen  What Year? 0 points  What month? 0 points  What time? 0 points  Count back from 20 0 points  Months in reverse 0 points  Repeat phrase 0 points  Total Score 0 points    Immunizations Immunization History  Administered Date(s) Administered   Fluad Quad(high Dose 65+) 08/01/2022   Influenza Split 08/09/2012   Influenza Whole 11/09/2008, 11/30/2009, 09/19/2010   Influenza,inj,Quad PF,6+ Mos 08/21/2014, 07/16/2015, 08/20/2017, 08/02/2018, 07/02/2019, 07/06/2020, 08/25/2021   PFIZER(Purple Top)SARS-COV-2 Vaccination 04/28/2020, 05/19/2020   PPD Test 08/09/2012, 08/13/2012   Pfizer Covid-19 Vaccine Bivalent Booster 84yrs & up 08/25/2021   Td 10/16/2000   Tdap 09/20/2011, 09/18/2022    TDAP status: Up  to date  {Pneumococcal vaccine status:2101807}  Covid-19 vaccine status: Information provided on how to obtain vaccines.   Qualifies for Shingles Vaccine? Yes   Zostavax completed No   Shingrix Completed?: No.    Education has been provided regarding the importance of this vaccine. Patient has been advised to call insurance company to determine out of pocket expense if they have not yet received this vaccine. Advised may also receive vaccine at local pharmacy or Health Dept. Verbalized acceptance and understanding.  Screening Tests Health  Maintenance  Topic Date Due   Zoster Vaccines- Shingrix (1 of 2) Never done   Medicare Annual Wellness (AWV)  09/02/2021   MAMMOGRAM  12/31/2021   Pneumonia Vaccine 80+ Years old (1 of 1 - PCV) Never done   COVID-19 Vaccine (4 - 2023-24 season) 06/16/2022   Colonoscopy  10/30/2022   INFLUENZA VACCINE  05/17/2023   DTaP/Tdap/Td (4 - Td or Tdap) 09/18/2032   DEXA SCAN  Completed   Hepatitis C Screening  Completed   HPV VACCINES  Aged Out    Health Maintenance  Health Maintenance Due  Topic Date Due   Zoster Vaccines- Shingrix (1 of 2) Never done   Medicare Annual Wellness (AWV)  09/02/2021   MAMMOGRAM  12/31/2021   Pneumonia Vaccine 27+ Years old (1 of 1 - PCV) Never done   COVID-19 Vaccine (4 - 2023-24 season) 06/16/2022   Colonoscopy  10/30/2022    {Colorectal cancer screening:2101809}  Mammogram status: Ordered 04/16/23. Pt provided with contact info and advised to call to schedule appt.   Bone Density status: Completed 06/12/18. Results reflect: Bone density results: OSTEOPENIA. Repeat every 2 years.  Lung Cancer Screening: (Low Dose CT Chest recommended if Age 5-80 years, 20 pack-year currently smoking OR have quit w/in 15years.) does not qualify.   Lung Cancer Screening Referral: n/a  Additional Screening:  Hepatitis C Screening: does qualify; Completed 05/23/18  Vision Screening: Recommended annual ophthalmology exams for early detection of glaucoma and other disorders of the eye.   Dental Screening: Recommended annual dental exams for proper oral hygiene  Community Resource Referral / Chronic Care Management: CRR required this visit?  {YES/NO:21197}  CCM required this visit?  {CCM Required [phone]}     Plan:     I have personally reviewed and noted the following in the patient's chart:   Medical and social history Use of alcohol, tobacco or illicit drugs  Current medications and supplements including opioid prescriptions. {Opioid  Prescriptions:[phone]} Functional ability and status Nutritional status Physical activity Advanced directives List of other physicians Hospitalizations, surgeries, and ER visits in previous 12 months Vitals Screenings to include cognitive, depression, and falls Referrals and appointments  In addition, I have reviewed and discussed with patient certain preventive protocols, quality metrics, and best practice recommendations. A written personalized care plan for preventive services as well as general preventive health recommendations were provided to patient.     Kandis Fantasia [place], [place]   1/61/0960   After Visit Summary: {CHL AMB AWV After Visit [phone]}  Nurse Notes: ***

## 2023-05-14 ENCOUNTER — Other Ambulatory Visit: Payer: Self-pay | Admitting: *Deleted

## 2023-05-14 MED ORDER — ALBUTEROL SULFATE HFA 108 (90 BASE) MCG/ACT IN AERS: INHALATION_SPRAY | RESPIRATORY_TRACT | 2 refills | Status: DC

## 2023-05-28 NOTE — Progress Notes (Addendum)
    SUBJECTIVE:   CHIEF COMPLAINT / HPI:   HTN Last seen on 05/03/23. BP was elevate din setting of severe pain d/t hip OA. Currently taking amlodipine 10 mg, losartan 25 mg and lopressor 50 mg daily but has not taken them today because she like to take them with breakfast and hasn't eaten yet.  No chest pain, shortness of breath, changes in vision.  Grief  Husband passed away on 09-Mar-2023. Has been living with her daughter but considering going back to her home soon. Has been seeing a grief counselor with hospice service. PHQ-9 score of 6 with # 9 0.  Has been feeling slower with thinking and doing things in general, having a difficult time with motivation. Questions what she should be doing now with her life. Some days are better than others. Feels gloomy today. Denies thoughts of self harm. Would like therapy resources. She used to take Zoloft, cannot remember why she stopped taking it. Would like to try it again.  Finds it helpful to spend time with family and reading.    PERTINENT  PMH / PSH: Depression, HTN  OBJECTIVE:   BP (!) 155/78   Pulse 75   Ht 5\' 2"  (1.575 m)   Wt 200 lb (90.7 kg)   SpO2 97%   BMI 36.58 kg/m    General: NAD, pleasant, able to participate in exam Cardiac: RRR Respiratory: Breathing comfortably on room air Neuro: alert, no obvious focal deficits Psych: Slow to speak, mood and affect appropriate for situation  ASSESSMENT/PLAN:   Depressed mood In setting of husband passing away a few months ago.  Has a good support system with family.  No concern for self-harm. -Provided therapeutic listening -Continue reading and spending time with family -Provided information for therapy resources including 24-hour availability -Would like to start Zoloft today which was prescribed -Return in 1 month for follow-up  Essential hypertension Uncontrolled however did not take medications today.  -Continue current regimen -Return in 1 month for follow-up, take  medication before appointment -Bring home blood pressure readings to next appointment   Plan to discuss health maintenance at next visit if appropriate   Dr. Erick Alley, DO [facility]

## 2023-05-29 ENCOUNTER — Ambulatory Visit (INDEPENDENT_AMBULATORY_CARE_PROVIDER_SITE_OTHER): Payer: 59 | Admitting: Student

## 2023-05-29 ENCOUNTER — Encounter: Payer: Self-pay | Admitting: Student

## 2023-05-29 VITALS — BP 155/78 | HR 75 | Ht 62.0 in | Wt 200.0 lb

## 2023-05-29 DIAGNOSIS — I10 Essential (primary) hypertension: Secondary | ICD-10-CM

## 2023-05-29 DIAGNOSIS — R4589 Other symptoms and signs involving emotional state: Secondary | ICD-10-CM | POA: Diagnosis not present

## 2023-05-29 MED ORDER — SERTRALINE HCL 25 MG PO TABS: 25.0000 mg | ORAL_TABLET | Freq: Every day | ORAL | 1 refills | Status: DC

## 2023-05-29 NOTE — Assessment & Plan Note (Signed)
In setting of husband passing away a few months ago.  Has a good support system with family.  No concern for self-harm. -Provided therapeutic listening -Continue reading and spending time with family -Provided information for therapy resources including 24-hour availability -Would like to start Zoloft today which was prescribed -Return in 1 month for follow-up

## 2023-05-29 NOTE — Patient Instructions (Addendum)
It was great to see you! Thank you for allowing me to participate in your care!  I recommend that you always bring your medications to each appointment as this makes it easy to ensure you are on the correct medications and helps [place] not miss when refills are needed.  Our plans for today:  - Call to schedule appointment: [facility] Address: [address] [place], [place] [address] Phone: [phone] - Go [email or web address] to help find therapy resources in your area  - Zoloft prescription has been sent to your pharmacy  - Return in 1 month for follow up -Check blood pressure daily and bring to next apt. Make sure you take you blood pressure medications before the next appointment   Take care and seek immediate care sooner if you develop any concerns.   Dr. Erick Alley, DO [facility]  24- Hour Availability:   [facility]  [address] [place], [place] [phone] Crisis [phone]  [facility] [phone]  [place] [facility]  [phone]   [facility]  [phone] (after hours)  Therapeutic Alternative/Mobile Crisis   [phone]  [place] National Suicide Hotline  [phone] Len Childs)  Call 911 or go to emergency room  [facility]  [phone]);  [place] and [employer]  [phone]); [place], [place], [place], [place], [place], [place], [place]

## 2023-05-29 NOTE — Assessment & Plan Note (Signed)
Uncontrolled however did not take medications today.  -Continue current regimen -Return in 1 month for follow-up, take medication before appointment -Bring home blood pressure readings to next appointment

## 2023-06-01 ENCOUNTER — Ambulatory Visit (INDEPENDENT_AMBULATORY_CARE_PROVIDER_SITE_OTHER): Payer: 59

## 2023-06-01 ENCOUNTER — Other Ambulatory Visit: Payer: Self-pay

## 2023-06-01 VITALS — Ht 62.0 in | Wt 200.0 lb

## 2023-06-01 DIAGNOSIS — Z0000 Encounter for general adult medical examination without abnormal findings: Secondary | ICD-10-CM | POA: Diagnosis not present

## 2023-06-01 MED ORDER — ALBUTEROL SULFATE HFA 108 (90 BASE) MCG/ACT IN AERS: INHALATION_SPRAY | RESPIRATORY_TRACT | 2 refills | Status: DC

## 2023-06-01 NOTE — Patient Instructions (Signed)
[name] , Thank you for taking time to come for your Medicare Wellness Visit. I appreciate your ongoing commitment to your health goals. Please review the following plan we discussed and let me know if I can assist you in the future.   Referrals/Orders/Follow-Ups/Clinician Recommendations: Aim for 30 minutes of exercise or brisk walking, 6-8 glasses of water, and 5 servings of fruits and vegetables each day.   This is a list of the screening recommended for you and due dates:  Health Maintenance  Topic Date Due   Zoster (Shingles) Vaccine (1 of 2) Never done   Mammogram  12/31/2021   Pneumonia Vaccine (1 of 1 - PCV) Never done   COVID-19 Vaccine (4 - 2023-24 season) 06/16/2022   Colon Cancer Screening  10/30/2022   Flu Shot  05/17/2023   Medicare Annual Wellness Visit  05/31/2024   DTaP/Tdap/Td vaccine (4 - Td or Tdap) 09/18/2032   DEXA scan (bone density measurement)  Completed   Hepatitis C Screening  Completed   HPV Vaccine  Aged Out    Advanced directives: (ACP Link)Information on Advanced Care Planning can be found at [facility] Advance Health Care Directives Advance Health Care Directives ([email or web address]   Next Medicare Annual Wellness Visit scheduled for next year: Yes  Preventive Care 65 Years and Older, Female Preventive care refers to lifestyle choices and visits with your health care provider that can promote health and wellness. What does preventive care include? A yearly physical exam. This is also called an annual well check. Dental exams once or twice a year. Routine eye exams. Ask your health care provider how often you should have your eyes checked. Personal lifestyle choices, including: Daily care of your teeth and gums. Regular physical activity. Eating a healthy diet. Avoiding tobacco and drug use. Limiting alcohol use. Practicing safe sex. Taking low-dose aspirin every day. Taking vitamin and mineral supplements as recommended by your  health care provider. What happens during an annual well check? The services and screenings done by your health care provider during your annual well check will depend on your age, overall health, lifestyle risk factors, and family history of disease. Counseling  Your health care provider may ask you questions about your: Alcohol use. Tobacco use. Drug use. Emotional well-being. Home and relationship well-being. Sexual activity. Eating habits. History of falls. Memory and ability to understand (cognition). Work and work Astronomer. Reproductive health. Screening  You may have the following tests or measurements: Height, weight, and BMI. Blood pressure. Lipid and cholesterol levels. These may be checked every 5 years, or more frequently if you are over 31 years old. Skin check. Lung cancer screening. You may have this screening every year starting at age 39 if you have a 30-pack-year history of smoking and currently smoke or have quit within the past 15 years. Fecal occult blood test (FOBT) of the stool. You may have this test every year starting at age 46. Flexible sigmoidoscopy or colonoscopy. You may have a sigmoidoscopy every 5 years or a colonoscopy every 10 years starting at age 35. Hepatitis C blood test. Hepatitis B blood test. Sexually transmitted disease (STD) testing. Diabetes screening. This is done by checking your blood sugar (glucose) after you have not eaten for a while (fasting). You may have this done every 1-3 years. Bone density scan. This is done to screen for osteoporosis. You may have this done starting at age 22. Mammogram. This may be done every 1-2 years. Talk to your health  care provider about how often you should have regular mammograms. Talk with your health care provider about your test results, treatment options, and if necessary, the need for more tests. Vaccines  Your health care provider may recommend certain vaccines, such as: Influenza vaccine.  This is recommended every year. Tetanus, diphtheria, and acellular pertussis (Tdap, Td) vaccine. You may need a Td booster every 10 years. Zoster vaccine. You may need this after age 76. Pneumococcal 13-valent conjugate (PCV13) vaccine. One dose is recommended after age 16. Pneumococcal polysaccharide (PPSV23) vaccine. One dose is recommended after age 97. Talk to your health care provider about which screenings and vaccines you need and how often you need them. This information is not intended to replace advice given to you by your health care provider. Make sure you discuss any questions you have with your health care provider. Document Released: 10/29/2015 Document Revised: 06/21/2016 Document Reviewed: 08/03/2015 Elsevier Interactive Patient Education  2017 [employer].  Fall Prevention in the Home Falls can cause injuries. They can happen to people of all ages. There are many things you can do to make your home safe and to help prevent falls. What can I do on the outside of my home? Regularly fix the edges of walkways and driveways and fix any cracks. Remove anything that might make you trip as you walk through a door, such as a raised step or threshold. Trim any bushes or trees on the path to your home. Use bright outdoor lighting. Clear any walking paths of anything that might make someone trip, such as rocks or tools. Regularly check to see if handrails are loose or broken. Make sure that both sides of any steps have handrails. Any raised decks and porches should have guardrails on the edges. Have any leaves, snow, or ice cleared regularly. Use sand or salt on walking paths during winter. Clean up any spills in your garage right away. This includes oil or grease spills. What can I do in the bathroom? Use night lights. Install grab bars by the toilet and in the tub and shower. Do not use towel bars as grab bars. Use non-skid mats or decals in the tub or shower. If you need to sit  down in the shower, use a plastic, non-slip stool. Keep the floor dry. Clean up any water that spills on the floor as soon as it happens. Remove soap buildup in the tub or shower regularly. Attach bath mats securely with double-sided non-slip rug tape. Do not have throw rugs and other things on the floor that can make you trip. What can I do in the bedroom? Use night lights. Make sure that you have a light by your bed that is easy to reach. Do not use any sheets or blankets that are too big for your bed. They should not hang down onto the floor. Have a firm chair that has side arms. You can use this for support while you get dressed. Do not have throw rugs and other things on the floor that can make you trip. What can I do in the kitchen? Clean up any spills right away. Avoid walking on wet floors. Keep items that you use a lot in easy-to-reach places. If you need to reach something above you, use a strong step stool that has a grab bar. Keep electrical cords out of the way. Do not use floor polish or wax that makes floors slippery. If you must use wax, use non-skid floor wax. Do not have throw  rugs and other things on the floor that can make you trip. What can I do with my stairs? Do not leave any items on the stairs. Make sure that there are handrails on both sides of the stairs and use them. Fix handrails that are broken or loose. Make sure that handrails are as long as the stairways. Check any carpeting to make sure that it is firmly attached to the stairs. Fix any carpet that is loose or worn. Avoid having throw rugs at the top or bottom of the stairs. If you do have throw rugs, attach them to the floor with carpet tape. Make sure that you have a light switch at the top of the stairs and the bottom of the stairs. If you do not have them, ask someone to add them for you. What else can I do to help prevent falls? Wear shoes that: Do not have high heels. Have rubber bottoms. Are  comfortable and fit you well. Are closed at the toe. Do not wear sandals. If you use a stepladder: Make sure that it is fully opened. Do not climb a closed stepladder. Make sure that both sides of the stepladder are locked into place. Ask someone to hold it for you, if possible. Clearly mark and make sure that you can see: Any grab bars or handrails. First and last steps. Where the edge of each step is. Use tools that help you move around (mobility aids) if they are needed. These include: Canes. Walkers. Scooters. Crutches. Turn on the lights when you go into a dark area. Replace any light bulbs as soon as they burn out. Set up your furniture so you have a clear path. Avoid moving your furniture around. If any of your floors are uneven, fix them. If there are any pets around you, be aware of where they are. Review your medicines with your doctor. Some medicines can make you feel dizzy. This can increase your chance of falling. Ask your doctor what other things that you can do to help prevent falls. This information is not intended to replace advice given to you by your health care provider. Make sure you discuss any questions you have with your health care provider. Document Released: 07/29/2009 Document Revised: 03/09/2016 Document Reviewed: 11/06/2014 Elsevier Interactive Patient Education  2017 [employer].

## 2023-06-01 NOTE — Progress Notes (Signed)
Subjective:   [name] is a 67 y.o. female who presents for Medicare Annual (Subsequent) preventive examination.  Visit Complete: Virtual  I connected with  Lollie Marrow on 06/01/23 by a audio enabled telemedicine application and verified that I am speaking with the correct person using two identifiers.  Patient Location: Home  Provider Location: Home Office  I discussed the limitations of evaluation and management by telemedicine. The patient expressed understanding and agreed to proceed.  Vital Signs: Because this visit was a virtual/telehealth visit, some criteria may be missing or patient reported. Any vitals not documented were not able to be obtained and vitals that have been documented are patient reported.   Review of Systems     Cardiac Risk Factors include: advanced age (>35men, >64 women);hypertension;dyslipidemia     Objective:    Today's Vitals   06/01/23 1026  Weight: 200 lb (90.7 kg)  Height: 5\' 2"  (1.575 m)   Body mass index is 36.58 kg/m.     06/01/2023    1:23 PM 05/03/2023   11:29 AM 04/16/2023   10:56 AM 09/14/2022    2:24 PM 09/13/2022    6:12 PM 08/17/2022    7:06 AM 08/01/2022    3:26 PM  Advanced Directives  Does Patient Have a Medical Advance Directive? No No No No No No No  Would patient like information on creating a medical advance directive? Yes (MAU/Ambulatory/Procedural Areas - Information given) No - Patient declined No - Patient declined No - Patient declined No - Patient declined No - Patient declined No - Patient declined    Current Medications (verified) Outpatient Encounter Medications as of 06/01/2023  Medication Sig   albuterol (PROVENTIL HFA) 108 (90 Base) MCG/ACT inhaler INHALE TWO PUFFS BY MOUTH INTO THE LUNGS EVERY 6 HOURS AS NEEDED FOR WHEEZING OR SHORTNESS OF BREATH   amLODipine (NORVASC) 10 MG tablet TAKE 1 TABLET BY MOUTH EVERY  MORNING   Budesonide 90 MCG/ACT inhaler Inhale 1 puff into the lungs 2 (two) times  daily.   capsaicin (ZOSTRIX) 0.025 % cream Apply topically 2 (two) times daily.   cholecalciferol (VITAMIN D3) 25 MCG (1000 UNIT) tablet Take 1 tablet (1,000 Units total) by mouth daily.   fluticasone (FLONASE) 50 MCG/ACT nasal spray USE 1 SPRAY IN BOTH NOSTRILS  DAILY   fluticasone (FLOVENT HFA) 110 MCG/ACT inhaler Inhale 1 puff into the lungs daily as needed.   gabapentin (NEURONTIN) 100 MG capsule TAKE 1 CAPSULE BY MOUTH DAILY   loratadine (EQ ALLERGY RELIEF) 10 MG tablet Take 1 tablet (10 mg total) by mouth daily.   losartan (COZAAR) 25 MG tablet TAKE 1 TABLET BY MOUTH AT  BEDTIME   meloxicam (MOBIC) 15 MG tablet Take 1 tablet (15 mg total) by mouth daily.   metoprolol tartrate (LOPRESSOR) 50 MG tablet TAKE 1 TABLET BY MOUTH TWICE  DAILY   rosuvastatin (CRESTOR) 5 MG tablet TAKE ONE TABLET BY MOUTH EVERY  MONDAY AND FRIDAY.   sertraline (ZOLOFT) 25 MG tablet Take 1 tablet (25 mg total) by mouth daily.   No facility-administered encounter medications on file as of 06/01/2023.    Allergies (verified) Hctz [hydrochlorothiazide], Hydrocodone, and Simvastatin   History: Past Medical History:  Diagnosis Date   Allergy    Anxiety    Arthritis    degenerative in back and hips   Atypical chest pain 05/03/2015   Low risk Myoview 2016, normal LVF by echo Dec 2020   Carpal tunnel syndrome, bilateral  Chest pain    Elevated PTHrP level 06/19/2018   Syncope   Feeling grief 12/30/2015   Heart murmur    age 89 said had heart murmur.   Hyperlipidemia    Hyperparathyroidism (HCC) 07/25/2019   Hypertension    Normal cardiac stress test    Myoview stress test   OAB (overactive bladder) 09/01/2015   Osteopenia 05/2018   T score -1.1 FRAX 2.5%/ 0.1%   Subclinical hyperthyroidism 05/08/2007   Qualifier: Diagnosis of  By: Jimmey Ralph MD, Victorino Dike     Past Surgical History:  Procedure Laterality Date   COLONOSCOPY     POLYPECTOMY     TOTAL HIP ARTHROPLASTY Left 10/01/2013   DR Turner Daniels   TOTAL HIP  ARTHROPLASTY Left 10/01/2013   Procedure: TOTAL HIP ARTHROPLASTY;  Surgeon: [name], MD;  Location: [facility] OR;  Service: Orthopedics;  Laterality: Left;   Family History  Problem Relation Age of Onset   Hypertension Mother    Diabetes Father    Hyperlipidemia Brother    Breast cancer Paternal Grandmother    Heart attack Neg Hx    Sudden death Neg Hx    Colon cancer Neg Hx    Hypercalcemia Neg Hx    Social History   Socioeconomic History   Marital status: Divorced    Spouse name: Not on file   Number of children: 3   Years of education: 18   Highest education level: Manufacturing engineer (e.g., MA, MS, MEng, MEd, MSW, MBA)  Occupational History   Occupation: Control and instrumentation engineer: [employer]    Comment: pt is an Event organiser for the parishioner's service at [employer]   Tobacco Use   Smoking status: Never    Passive exposure: Never   Smokeless tobacco: Never  Vaping Use   Vaping status: Never Used  Substance and Sexual Activity   Alcohol use: No    Alcohol/week: 0.0 standard drinks of alcohol   Drug use: No   Sexual activity: Yes    Partners: Male    Birth control/protection: Post-menopausal  Other Topics Concern   Not on file  Social History Narrative   Patient lives alone in [place].    Patient has 3 children and has grandchildren.    Patient enjoys reading, spending time with her family, and very involved in her church.    Social Determinants of Health   Financial Resource Strain: Patient Declined (01/08/2023)   Received from [facility]   Overall Financial Resource Strain (CARDIA)    Difficulty of Paying Living Expenses: Patient declined  Food Insecurity: No Food Insecurity (06/01/2023)   Hunger Vital Sign    Worried About Running Out of Food in the Last Year: Never true    Ran Out of Food in the Last Year: Never true  Transportation Needs: No Transportation Needs (06/01/2023)   PRAPARE - Administrator, Civil Service  (Medical): No    Lack of Transportation (Non-Medical): No  Physical Activity: Insufficiently Active (06/01/2023)   Exercise Vital Sign    Days of Exercise per Week: 3 days    Minutes of Exercise per Session: 30 min  Stress: No Stress Concern Present (06/01/2023)   [employer] of Occupational Health - Occupational Stress Questionnaire    Feeling of Stress : Not at all  Social Connections: Moderately Integrated (06/01/2023)   Social Connection and Isolation Panel [NHANES]    Frequency of Communication with Friends and Family: More than three times a week  Frequency of Social Gatherings with Friends and Family: Three times a week    Attends Religious Services: More than 4 times per year    Active Member of Clubs or Organizations: Yes    Attends Banker Meetings: 1 to 4 times per year    Marital Status: Widowed    Tobacco Counseling Counseling given: Not Answered   Clinical Intake:  Pre-visit preparation completed: Yes  Pain : No/denies pain     Diabetes: No  How often do you need to have someone help you when you read instructions, pamphlets, or other written materials from your doctor or pharmacy?: 1 - Never  Interpreter Needed?: No  Information entered by :: Kandis Fantasia LPN   Activities of Daily Living    06/01/2023    1:22 PM  In your present state of health, do you have any difficulty performing the following activities:  Hearing? 0  Vision? 0  Difficulty concentrating or making decisions? 0  Walking or climbing stairs? 0  Dressing or bathing? 0  Doing errands, shopping? 0  Preparing Food and eating ? N  Using the Toilet? N  In the past six months, have you accidently leaked urine? N  Do you have problems with loss of bowel control? N  Managing your Medications? N  Managing your Finances? N  Housekeeping or managing your Housekeeping? N    Patient Care Team: Erick Alley, DO as PCP - General (Family Medicine) Runell Gess, MD as  PCP - Cardiology (Cardiology) York Spaniel, MD (Inactive) as Consulting Physician (Neurology)  Indicate any recent Medical Services you may have received from other than [facility] providers in the past year (date may be approximate).     Assessment:   This is a routine wellness examination for [name].  Hearing/Vision screen Hearing Screening - Comments:: Denies hearing difficulties   Vision Screening - Comments:: No vision problems; will schedule routine eye exam soon    Dietary issues and exercise activities discussed:     Goals Addressed             This Visit's Progress    Remain active and independent        Depression Screen    06/01/2023    1:21 PM 05/29/2023   10:52 AM 05/03/2023   11:29 AM 04/16/2023   10:57 AM 03/22/2023   10:25 AM 09/14/2022    2:36 PM 08/01/2022    4:20 PM  PHQ 2/9 Scores  PHQ - 2 Score 2 2 4 6 3 3  0  PHQ- 9 Score 6 6 15 24 10 5  0    Fall Risk    06/01/2023    1:22 PM 05/03/2023   11:29 AM 04/16/2023   10:57 AM 03/22/2023   10:25 AM 09/14/2022    2:24 PM  Fall Risk   Falls in the past year? 0 0 0 0 1  Number falls in past yr: 0 0 0 0 0  Injury with Fall? 0 0 0 0   Risk for fall due to : No Fall Risks  Impaired balance/gait    Follow up Falls prevention discussed;Education provided;Falls evaluation completed  Falls prevention discussed      MEDICARE RISK AT HOME:  Medicare Risk at Home - 06/01/23 1322     Any stairs in or around the home? No    If so, are there any without handrails? No    Home free of loose throw rugs in walkways, pet beds, electrical cords, etc?  Yes    Adequate lighting in your home to reduce risk of falls? Yes    Life alert? No    Use of a cane, walker or w/c? No    Grab bars in the bathroom? Yes    Shower chair or bench in shower? No    Elevated toilet seat or a handicapped toilet? No             TIMED UP AND GO:  Was the test performed?  No    Cognitive Function:    01/13/2021    8:54 AM 10/12/2020     8:17 AM  MMSE - Mini Mental State Exam  Orientation to time 3 5  Orientation to Place 5 5  Registration 3 3  Attention/ Calculation 2 2  Recall 3 3  Language- name 2 objects 2 2  Language- repeat 1 1  Language- follow 3 step command 3 3  Language- read & follow direction 1 1  Write a sentence 1 1  Copy design 1 1  Total score 25 27      02/10/2022   12:32 PM  Montreal Cognitive Assessment   Visuospatial/ Executive (0/5) 5  Naming (0/3) 3  Attention: Read list of digits (0/2) 2  Attention: Read list of letters (0/1) 1  Attention: Serial 7 subtraction starting at 100 (0/3) 3  Language: Repeat phrase (0/2) 1  Language : Fluency (0/1) 1  Abstraction (0/2) 2  Delayed Recall (0/5) 0  Orientation (0/6) 6  Total 24  Adjusted Score (based on education) 24      06/01/2023    1:23 PM 09/02/2020   10:39 AM  6CIT Screen  What Year? 0 points 0 points  What month? 0 points 0 points  What time? 0 points 0 points  Count back from 20 0 points 0 points  Months in reverse 0 points 0 points  Repeat phrase 0 points 0 points  Total Score 0 points 0 points    Immunizations Immunization History  Administered Date(s) Administered   Fluad Quad(high Dose 65+) 08/01/2022   Influenza Split 08/09/2012   Influenza Whole 11/09/2008, 11/30/2009, 09/19/2010   Influenza,inj,Quad PF,6+ Mos 08/21/2014, 07/16/2015, 08/20/2017, 08/02/2018, 07/02/2019, 07/06/2020, 08/25/2021   PFIZER(Purple Top)SARS-COV-2 Vaccination 04/28/2020, 05/19/2020   PPD Test 08/09/2012, 08/13/2012   Pfizer Covid-19 Vaccine Bivalent Booster 91yrs & up 08/25/2021   Td 10/16/2000   Tdap 09/20/2011, 09/18/2022    TDAP status: Up to date  Flu Vaccine status: Due, Education has been provided regarding the importance of this vaccine. Advised may receive this vaccine at local pharmacy or Health Dept. Aware to provide a copy of the vaccination record if obtained from local pharmacy or Health Dept. Verbalized acceptance and  understanding.  Pneumococcal vaccine status: Due, Education has been provided regarding the importance of this vaccine. Advised may receive this vaccine at local pharmacy or Health Dept. Aware to provide a copy of the vaccination record if obtained from local pharmacy or Health Dept. Verbalized acceptance and understanding.  Covid-19 vaccine status: Information provided on how to obtain vaccines.   Qualifies for Shingles Vaccine? Yes   Zostavax completed No   Shingrix Completed?: No.    Education has been provided regarding the importance of this vaccine. Patient has been advised to call insurance company to determine out of pocket expense if they have not yet received this vaccine. Advised may also receive vaccine at local pharmacy or Health Dept. Verbalized acceptance and understanding.  Screening Tests Health Maintenance  Topic  Date Due   Zoster Vaccines- Shingrix (1 of 2) Never done   MAMMOGRAM  12/31/2021   Pneumonia Vaccine 36+ Years old (1 of 1 - PCV) Never done   COVID-19 Vaccine (4 - 2023-24 season) 06/16/2022   Colonoscopy  10/30/2022   INFLUENZA VACCINE  05/17/2023   Medicare Annual Wellness (AWV)  05/31/2024   DTaP/Tdap/Td (4 - Td or Tdap) 09/18/2032   DEXA SCAN  Completed   Hepatitis C Screening  Completed   HPV VACCINES  Aged Out    Health Maintenance  Health Maintenance Due  Topic Date Due   Zoster Vaccines- Shingrix (1 of 2) Never done   MAMMOGRAM  12/31/2021   Pneumonia Vaccine 12+ Years old (1 of 1 - PCV) Never done   COVID-19 Vaccine (4 - 2023-24 season) 06/16/2022   Colonoscopy  10/30/2022   INFLUENZA VACCINE  05/17/2023    Colorectal cancer screening: Type of screening: Colonoscopy. Completed 10/31/19. Repeat every 3 years  Mammogram status: Ordered 04/16/23. Pt provided with contact info and advised to call to schedule appt.   Bone Density status: Completed 87/27/19. Results reflect: Bone density results: NORMAL. Repeat every 5 years.  Lung Cancer  Screening: (Low Dose CT Chest recommended if Age 51-80 years, 20 pack-year currently smoking OR have quit w/in 15years.) does not qualify.   Lung Cancer Screening Referral: n/a  Additional Screening:  Hepatitis C Screening: does qualify; Completed 05/23/18  Vision Screening: Recommended annual ophthalmology exams for early detection of glaucoma and other disorders of the eye. Is the patient up to date with their annual eye exam?  No  Who is the provider or what is the name of the office in which the patient attends annual eye exams? none If pt is not established with a provider, would they like to be referred to a provider to establish care? No .   Dental Screening: Recommended annual dental exams for proper oral hygiene  Community Resource Referral / Chronic Care Management: CRR required this visit?  No   CCM required this visit?  No     Plan:     I have personally reviewed and noted the following in the patient's chart:   Medical and social history Use of alcohol, tobacco or illicit drugs  Current medications and supplements including opioid prescriptions. Patient is not currently taking opioid prescriptions. Functional ability and status Nutritional status Physical activity Advanced directives List of other physicians Hospitalizations, surgeries, and ER visits in previous 12 months Vitals Screenings to include cognitive, depression, and falls Referrals and appointments  In addition, I have reviewed and discussed with patient certain preventive protocols, quality metrics, and best practice recommendations. A written personalized care plan for preventive services as well as general preventive health recommendations were provided to patient.     Kandis Fantasia [place], [place]   1/61/0960   After Visit Summary: (MyChart) Due to this being a telephonic visit, the after visit summary with patients personalized plan was offered to patient via MyChart   Nurse Notes: No  concerns at this time

## 2023-06-05 ENCOUNTER — Other Ambulatory Visit: Payer: Self-pay

## 2023-06-05 MED ORDER — ROSUVASTATIN CALCIUM 5 MG PO TABS: ORAL_TABLET | ORAL | 4 refills | Status: DC

## 2023-06-08 ENCOUNTER — Ambulatory Visit (INDEPENDENT_AMBULATORY_CARE_PROVIDER_SITE_OTHER): Payer: 59 | Admitting: Student

## 2023-06-08 ENCOUNTER — Other Ambulatory Visit: Payer: Self-pay | Admitting: Family Medicine

## 2023-06-08 ENCOUNTER — Encounter: Payer: Self-pay | Admitting: Student

## 2023-06-08 VITALS — BP 120/78 | HR 94 | Ht 62.0 in | Wt 201.0 lb

## 2023-06-08 DIAGNOSIS — M1610 Unilateral primary osteoarthritis, unspecified hip: Secondary | ICD-10-CM

## 2023-06-08 MED ORDER — MELOXICAM 15 MG PO TABS: 15.0000 mg | ORAL_TABLET | Freq: Every day | ORAL | 0 refills | Status: DC

## 2023-06-08 NOTE — Assessment & Plan Note (Signed)
Seems that the patient has tried all conservative treatment measures excluding steroid injections of the right hip.  We discussed that she could try this with orthopedics when she sees them at next visit.  I would recommend that she trial a steroid injection although I am unsure of the benefit of this.  After that, I would recommend that she see Ortho for discussion of surgical intervention of the right hip.  Continue with conservative treatment measures until seeing Ortho with over-the-counter analgesics, stretching, ice/heat.

## 2023-06-08 NOTE — Progress Notes (Signed)
Patient called after-hours line regarding meloxicam refill.  She says that she called the clinic however it was routed to inpatient after-hours pager.  Unsure why it was routed to inpatient after-hours pager at 2:35 PM on Friday afternoon.  However patient said that her right hip has been hurting.  She has seen Dr. Yetta Barre for this in the past.  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  Has had a left hip replacement in the past.  Dr. Yetta Barre had recommended patient seeing Ortho at that time patient has not called them.  I refilled meloxicam and informed patient of number to call her orthopedic provider that was in Dr. Barnett Applebaum note.  Patient said that she would call the orthopedic office.  [name]

## 2023-06-08 NOTE — Patient Instructions (Addendum)
It was great to see you today! Thank you for choosing [facility] for your primary care.  Today we addressed: [facility] Address: [address] [place], [place] [address] Phone: [phone]  Continue with Meloxicam and Tylenol for the pain as well as lidocaine patches   If you haven't already, sign up for My Chart to have easy access to your labs results, and communication with your primary care physician. I recommend that you always bring your medications to each appointment as this makes it easy to ensure you are on the correct medications and helps [place] not miss refills when you need them. Call the clinic at [phone] if your symptoms worsen or you have any concerns. Return in about 3 months (around 09/08/2023). Please arrive 15 minutes before your appointment to ensure smooth check in process.  We appreciate your efforts in making this happen.  Thank you for allowing me to participate in your care, Alfredo Martinez, MD 06/08/2023, 4:15 PM PGY-3, [facility]

## 2023-06-08 NOTE — Assessment & Plan Note (Signed)
>>  ASSESSMENT AND PLAN FOR HIP ARTHRITIS WRITTEN ON 06/08/2023  4:21 PM BY MAXWELL, ALLEE, MD  Seems that the patient has tried all conservative treatment measures excluding steroid injections of the right hip.  We discussed that she could try this with orthopedics when she sees them at next visit.  I would recommend that she trial a steroid injection although I am unsure of the benefit of this.  After that, I would recommend that she see Ortho for discussion of surgical intervention of the right hip.  Continue with conservative treatment measures until seeing Ortho with over-the-counter analgesics, stretching, ice/heat.

## 2023-06-08 NOTE — Progress Notes (Deleted)
  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   PERTINENT  PMH / PSH: ***   Patient Care Team: Erick Alley, DO as PCP - General (Family Medicine) Runell Gess, MD as PCP - Cardiology (Cardiology) York Spaniel, MD (Inactive) as Consulting Physician (Neurology) OBJECTIVE:  BP 120/78   Pulse 94   Ht 5\' 2"  (1.575 m)   Wt 201 lb (91.2 kg)   SpO2 98%   BMI 36.76 kg/m  Physical Exam   ASSESSMENT/PLAN:  There are no diagnoses linked to this encounter. No follow-ups on file. Beverly Martinez, MD 06/08/2023, 4:06 PM PGY-***, [facility] {    This will disappear when note is signed, click to select method of visit    :1}

## 2023-06-08 NOTE — Progress Notes (Signed)
  SUBJECTIVE:   CHIEF COMPLAINT / HPI:   Right Hip Pain: -- Onset a few months ago -- Tried PT to no avail  -- Tried Tylenol, meloxicam, lidocaine patches, stretches, ice -Patient has limited range of motion in that hip - X-ray of the hip showed superior aspect with osteoarthritis - Has a history of left hip replacement - Has yet to see orthopedics or receive a steroid injection  PERTINENT  PMH / PSH:   Reviewed  Patient Care Team: Erick Alley, DO as PCP - General (Family Medicine) Runell Gess, MD as PCP - Cardiology (Cardiology) York Spaniel, MD (Inactive) as Consulting Physician (Neurology) OBJECTIVE:  BP 120/78   Pulse 94   Ht 5\' 2"  (1.575 m)   Wt 201 lb (91.2 kg)   SpO2 98%   BMI 36.76 kg/m  Physical Exam  General: NAD, pleasant, able to participate in exam Card: RRR Respiratory: No respiratory distress Skin: warm and dry, no rashes noted Psych: Normal affect and mood Right Hip:  Limited exam given inaccessible bed/equipment malfunction Limited external rotation of right hip Normal flexion of right knee Tenderness to palpation over the trochanter ASSESSMENT/PLAN:  Hip arthritis Assessment & Plan: Seems that the patient has tried all conservative treatment measures excluding steroid injections of the right hip.  We discussed that she could try this with orthopedics when she sees them at next visit.  I would recommend that she trial a steroid injection although I am unsure of the benefit of this.  After that, I would recommend that she see Ortho for discussion of surgical intervention of the right hip.  Continue with conservative treatment measures until seeing Ortho with over-the-counter analgesics, stretching, ice/heat.    Return in about 3 months (around 09/08/2023). Alfredo Martinez, MD 06/08/2023, 4:21 PM PGY-3, [facility]

## 2023-06-14 ENCOUNTER — Encounter: Payer: Self-pay | Admitting: Student

## 2023-06-19 DIAGNOSIS — M160 Bilateral primary osteoarthritis of hip: Secondary | ICD-10-CM | POA: Diagnosis not present

## 2023-06-25 ENCOUNTER — Other Ambulatory Visit: Payer: Self-pay | Admitting: *Deleted

## 2023-06-25 MED ORDER — ROSUVASTATIN CALCIUM 5 MG PO TABS: ORAL_TABLET | ORAL | 4 refills | Status: DC

## 2023-07-03 ENCOUNTER — Ambulatory Visit: Payer: 59 | Admitting: Student

## 2023-07-03 ENCOUNTER — Telehealth: Payer: Self-pay | Admitting: Student

## 2023-07-03 NOTE — Telephone Encounter (Signed)
Attempted to call patient to discuss surgical optimization form from [facility]. LMV asking pt to call back. I would prefer her to schedule tele visit or in person visit to further discuss. Letter sent.

## 2023-07-13 ENCOUNTER — Other Ambulatory Visit: Payer: Self-pay | Admitting: Student

## 2023-07-13 DIAGNOSIS — R413 Other amnesia: Secondary | ICD-10-CM | POA: Diagnosis not present

## 2023-07-13 DIAGNOSIS — R4589 Other symptoms and signs involving emotional state: Secondary | ICD-10-CM

## 2023-07-14 ENCOUNTER — Encounter: Payer: Self-pay | Admitting: Student

## 2023-07-24 ENCOUNTER — Encounter: Payer: Self-pay | Admitting: Student

## 2023-07-24 ENCOUNTER — Ambulatory Visit (INDEPENDENT_AMBULATORY_CARE_PROVIDER_SITE_OTHER): Payer: 59 | Admitting: Student

## 2023-07-24 VITALS — BP 176/98 | HR 73 | Ht <= 58 in | Wt 199.0 lb

## 2023-07-24 DIAGNOSIS — Z23 Encounter for immunization: Secondary | ICD-10-CM | POA: Diagnosis not present

## 2023-07-24 DIAGNOSIS — R413 Other amnesia: Secondary | ICD-10-CM

## 2023-07-24 DIAGNOSIS — M1611 Unilateral primary osteoarthritis, right hip: Secondary | ICD-10-CM | POA: Diagnosis not present

## 2023-07-24 DIAGNOSIS — I10 Essential (primary) hypertension: Secondary | ICD-10-CM | POA: Diagnosis not present

## 2023-07-24 DIAGNOSIS — R4589 Other symptoms and signs involving emotional state: Secondary | ICD-10-CM

## 2023-07-24 NOTE — Progress Notes (Unsigned)
SUBJECTIVE:   CHIEF COMPLAINT / HPI:   Patient is accompanied by her daughter today.  Depression and memory issues PHQ-9 positive for #9. States she feels like a burden to her family because they have to help her with so much. Has no current plans to harm herslef. Says she does love life and wants to stay alive for her grand children and for herself. Does see a grief counselor as her husband died this past year which has contributed to her depression.  She is currently on Zoloft 25 mg, does not want to increase the dosage at this time.   Currently needing help managing money d/t memory issues per daughter.  Daughter states pt has had to repeat herself more often over past 2 years.   Right hip OA Patient is currently planning to have right hip replacement.  I did receive surgical clearance form from surgeon.  Daughter and patient would like more information about recovery process, PT after surgery, etc.  There is some concern about patient being able to take care of herself/family taking care of her after the surgery especially as she is moving soon.  HTN Patient is prescribed amlodipine 10 mg, losartan 25 mg, metoprolol 50 mg twice daily.  She cannot state her medications or what they are for today but says she has a list of her medications at home she uses when taking her medications every day.  PERTINENT  PMH / PSH: hip OA, HTN, age-related memory loss  OBJECTIVE:   BP (!) 176/98   Pulse 73   Ht 4\' 10"  (1.473 m)   Wt 199 lb (90.3 kg)   SpO2 100%   BMI 41.59 kg/m    General: NAD, pleasant, able to participate in exam Cardiac: RRR, no murmurs. Respiratory: CTAB, normal effort, No wheezes, rales or rhonchi Extremities: no edema BLEs Skin: warm and dry Neuro: alert, no obvious focal deficits Psych: Normal affect and mood  ASSESSMENT/PLAN:   Osteoarthritis of right hip Patient daughter plan to make appointment with Ortho surgeon in near future to further discuss what to  expect after surgery before scheduling the procedure.  I did advise she should hold Mobic the day before and day of the procedure and hold losartan the day of the procedure.  However, I am going to hold off on signing the surgical clearance form until patient returns to go over all medications and further discuss memory issues.  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 Patient was seen last year in geri clinic with [facility] 24/30.  Had brain MR done in 2022 showing chronic age-related microvascular ischemic changes.  She was recently seen by neurology with [facility].  I plan to review their notes before next visit.  Patient and daughter agree to return in about 2 weeks to further discuss and to especially go over medications with both daughter and patient.  Can consider getting patient scheduled again in [place] clinic.  Essential hypertension Uncontrolled but cannot confirm medication adherence.  Patient plans to take her medications, check her blood pressure daily and bring BP readings and medications to follow-up visit in 2 weeks. Daughter agrees to help with this if needed.   Depressed mood Currently has a good support system and SI is passive with no current plans.  Has protective factors and states she does find joy in life. -Continue Zoloft 25 mg, patient declines increasing dosage at this time -Patient encouraged to continue grief counseling -Patient advised to continue spending time with family and doing hobbies that  she enjoys -will CTM  Flu and COVID vaccines administered today   Next visit should be 40 minute visit to allow adequate time to discuss all issues.   Dr. Erick Alley, DO [place] [facility]

## 2023-07-24 NOTE — Patient Instructions (Addendum)
It was great to see you! Thank you for allowing me to participate in your care!  I recommend that you always bring your medications to each appointment as this makes it easy to ensure you are on the correct medications and helps [place] not miss when refills are needed.  Our plans for today:  - Hold losartan the day of the surgery - Hold Mobic (meloxicam) the day prior to and day of the surgery  - I will look into neurology recommendations - talk to surgeon about recovery time and process -Return in 2 weeks for follow up   We are checking some labs today, I will call you if they are abnormal will send you a MyChart message or a letter if they are normal.  If you do not hear about your labs in the next 2 weeks please let us know.  Take care and seek immediate care sooner if you develop any concerns.   Dr. Erick Alley, DO [facility]    Therapy and Counseling Resources Most providers on this list will take Medicaid. Patients with commercial insurance or Medicare should contact their insurance company to get a list of in network providers.  [facility] (spanish speaking therapist available)(habla espanol)(take medicare and medicaid)  [address], [place], [place] [address], [email or web address] [phone]  [facility]iat[facility] Counseling [address] [place], [place] [address] [phone]  [facility]   [address], Suite [place], [place] [address]      [phone]  [facility] (spanish available) [address]  [place], [place] [address] [phone]  [facility] (take [facility] and medicare) [address], [address]  [place], [place] [address]       [phone]     [facility] (virtual only) [phone]  [facility]ovita [facility]ussma[facility] [address], [place], [place] [phone]  [facility]:  [address] [place] [phone]  [facility]:  [address] [place] [place] [phone]  [facility] (under & uninsured) [address]   [place] [phone]    [email or web address]    [facility] [address]  [place]    [phone]  [facility] -[address]     Phone:  [phone]     [facility]  [address]  [phone]   [facility] [address]      [place], [place] [phone] ext [phone]  [facility]: [address] [place], [place], [place]  [phone]   [facility] (Spanish therapist) [email or web address]  [address] [place]  [address]   [place] [address]    [phone]    [facility]   [address],  [place], [place]  [phone]   [facility]  [address] [place]  [phone]  [facility]  [address] [place], [place]        [phone]  Open Access/Walk In Clinic under & uninsured  [facility]  [address] [place], [place] [phone] Crisis [phone]  [facility] [address],  (Spanish)   [address], [place]: [phone]) 8:30 - 12; 1 - 2:30  [facility] [employer],  [address] [place], [place]    [phone]):8:30 - 12; 2 - 3PM  [facility] [employer],  [address],  [place]; [phone]):   Mon - Fri 8 AM - 5 PM  [facility] [address] [place]  MWF 12:30 to 3:00 or call to schedule an appointment  [phone]  Specific Provider options Psychology Today  [email or web address].psychologytoday.com/us click on find a therapist  enter your zip code left side and select or tailor a therapist for your specific need.   [facility] Provider Directory [email or web address]  (Medicaid)   Follow all drop down to find a  provider  Social Support program [facility] [phone] or PhotoSolver.pl [address], [place], [place] Recovery support and educational   24- Hour Availability:   [facility]  [address] [place], [place] [phone] Crisis [phone]  [facility] [phone]  [facility]  [phone]   [facility]  [phone] (after hours)  Therapeutic Alternative/Mobile Crisis   [phone]  [place] National Suicide Hotline  [phone] Len Childs)  Call 911 or go to emergency room  [facility]  [phone]);  [place] and [employer]  [phone]); [place], [place], [employer], [place], [place], [place], [place]

## 2023-07-25 ENCOUNTER — Encounter: Payer: Self-pay | Admitting: Student

## 2023-07-25 NOTE — Assessment & Plan Note (Signed)
Patient daughter plan to make appointment with Ortho surgeon in near future to further discuss what to expect after surgery before scheduling the procedure.  I did advise she should hold Mobic the day before and day of the procedure and hold losartan the day of the procedure.  However, I am going to hold off on signing the surgical clearance form until patient returns to go over all medications and further discuss memory issues.

## 2023-07-25 NOTE — Assessment & Plan Note (Signed)
Uncontrolled but cannot confirm medication adherence.  Patient plans to take her medications, check her blood pressure daily and bring BP readings and medications to follow-up visit in 2 weeks. Daughter agrees to help with this if needed.

## 2023-07-25 NOTE — Assessment & Plan Note (Addendum)
Patient was seen last year in geri clinic with [facility] 24/30.  Had brain MR done in 2022 showing chronic age-related microvascular ischemic changes.  She was recently seen by neurology with [facility].  I plan to review their notes before next visit.  Patient and daughter agree to return in about 2 weeks to further discuss and to especially go over medications with both daughter and patient.  Can consider getting patient scheduled again in [place] clinic.

## 2023-07-25 NOTE — Assessment & Plan Note (Signed)
Currently has a good support system and SI is passive with no current plans.  Has protective factors and states she does find joy in life. -Continue Zoloft 25 mg, patient declines increasing dosage at this time -Patient encouraged to continue grief counseling -Patient advised to continue spending time with family and doing hobbies that she enjoys -will CTM

## 2023-07-26 ENCOUNTER — Telehealth: Payer: Self-pay

## 2023-07-26 NOTE — Telephone Encounter (Signed)
-----   Message from Caro Laroche sent at 07/26/2023  3:44 PM EDT ----- Orders signed. ----- Message ----- From: Penni Bombard, CMA Sent: 07/26/2023  10:39 AM EDT To: Caro Laroche, DO  I was able to schedule for 08/07/23 at [facility] at 1:10. I was informed that the doctor needs to sign the order again. Please send message back when done. Tried to call patient and her daughter to inform them but no answer. Will try again later. Penni Bombard CMA ----- Message ----- From: Sunday Spillers, CMA Sent: 07/25/2023   8:51 AM EDT To: [facility]  Referral has been sent to the Breast Center. Ok to schedule. Melvenia Beam ----- Message ----- From: Caro Laroche, DO Sent: 07/22/2023   2:13 PM EDT To: Sunday Spillers, CMA; Erick Alley, DO; #  If still having symptoms (breast lump) and unsure if she completed diagnostic mm elsewhere, she should get mammogram done. ----- Message ----- From: Pamelia Hoit, CMA Sent: 07/19/2023  12:21 PM EDT To: Sunday Spillers, CMA; Caro Laroche, DO  Spoke with pt and she doesn't remember. She was just diagnosed with dementia. Maree Erie, CMA ----- Message ----- From: Caro Laroche, DO Sent: 07/17/2023   2:29 PM EDT To: Sunday Spillers, CMA; Erick Alley, DO; #  Yes, good catch Melvenia Beam! Red team - can you call patient to see if she has had mammogram done outside of [facility]? Thanks all!! ----- Message ----- From: Sunday Spillers, CMA Sent: 07/17/2023  11:36 AM EDT To: Caro Laroche, DO; Erick Alley, DO  HI!  There is an open referral from 04/16/2023 for a diagnostic MM. Do either of you know if this was done outside of [facility]?  Communication tab shows [facility]  was noted.  Should team call pt to ask if MM has been done?  Please advise, Clemencia Course

## 2023-07-26 NOTE — Telephone Encounter (Signed)
Called patient again to inform her of an upcoming [place] appointment with [facility]. LVM with details giving her the date, time, and name of place. Instructed to call the office back if she needs to reschedule or has questions. Penni Bombard CMA

## 2023-08-07 ENCOUNTER — Other Ambulatory Visit: Payer: 59

## 2023-08-08 ENCOUNTER — Other Ambulatory Visit: Payer: Self-pay | Admitting: Family Medicine

## 2023-08-08 DIAGNOSIS — M1610 Unilateral primary osteoarthritis, unspecified hip: Secondary | ICD-10-CM

## 2023-08-08 DIAGNOSIS — R413 Other amnesia: Secondary | ICD-10-CM | POA: Diagnosis not present

## 2023-08-21 ENCOUNTER — Telehealth: Payer: Self-pay

## 2023-08-21 NOTE — Telephone Encounter (Signed)
Patient calls nurse line requesting an apt for tomorrow morning.   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 She reports she has been having feelings of "anxiousness" associated with feeling dizzy.  She denies feelings of shortness of breath, chest pains, headaches or vision changes. No syncope episodes.   She reports she has not been staying as well hydrated as she should.   Patient scheduled for tomorrow morning Irving Burton.   She reports she is going into work this evening.   Hydration encouraged.  Strict ED precautions given.

## 2023-08-22 ENCOUNTER — Ambulatory Visit (HOSPITAL_COMMUNITY)
Admission: RE | Admit: 2023-08-22 | Discharge: 2023-08-22 | Disposition: A | Discharge status: Home / Self Care | Payer: 59 | Source: Ambulatory Visit | Attending: Family Medicine | Admitting: Family Medicine

## 2023-08-22 ENCOUNTER — Ambulatory Visit: Payer: 59 | Admitting: Student

## 2023-08-22 VITALS — BP 134/80 | HR 77 | Ht 62.0 in | Wt 195.4 lb

## 2023-08-22 DIAGNOSIS — F419 Anxiety disorder, unspecified: Secondary | ICD-10-CM | POA: Insufficient documentation

## 2023-08-22 DIAGNOSIS — R4589 Other symptoms and signs involving emotional state: Secondary | ICD-10-CM | POA: Diagnosis not present

## 2023-08-22 MED ORDER — SERTRALINE HCL 50 MG PO TABS: 25.0000 mg | ORAL_TABLET | Freq: Every day | ORAL | 0 refills | Status: DC

## 2023-08-22 NOTE — Patient Instructions (Signed)
I am increasing your dose of Zoloft to 50 mg daily. This is potentially a temporary increase while you are dealing with the stress of your job and your husbands death. You can take 2 pills of the 25 mg strength tablets at home before you pick up your new prescription. Once you pick up the new prescription, only take one 50 mg pill per day.   Please schedule an appointment in 4 weeks to follow up on how you are doing.

## 2023-08-22 NOTE — Progress Notes (Signed)
    SUBJECTIVE:   CHIEF COMPLAINT / HPI:   [name] is a 67 y.o. female  presenting for episode of dizziness and near syncope.  Event occurred after she got off her third shift either Monday or Tuesday.  It only occurred 1 time and has not recurred since.  She denies loss of consciousness or fall.  She reports the event only lasted a few seconds and did not have any associated chest pain or shortness of breath.  She has never had seizure history or family seizure history.  Denies bowel or bladder incontinence, postictal state.  She is still struggling with dealing with the death of her husband.  She has a history of depression/anxiety and is currently on 25 mg of Zoloft and has been seeing a grief counselor which she reports has helped.  She thinks that her dizzy/near syncope spell was related to stress of her job and grief.  PERTINENT  PMH / PSH: Reviewed and updated   OBJECTIVE:   BP 134/80   Pulse 77   Ht 5\' 2"  (1.575 m)   Wt 195 lb 6.4 oz (88.6 kg)   SpO2 100%   BMI 35.74 kg/m   Well-appearing, no acute distress Cardio: Regular rate, regular rhythm, no murmurs on exam. Pulm: Clear, no wheezing, no crackles. No increased work of breathing Abdominal: bowel sounds present, soft, non-tender, non-distended Extremities: no peripheral edema  Psych:  Cognition and judgment appear intact. Alert, communicative  and cooperative with normal attention span and concentration. No apparent delusions, illusions, hallucinations   Neuro: CN II: PERRL CN III, IV,VI: EOMI CV V: Normal sensation in V1, V2, V3 CVII: Symmetric smile and brow raise CN VIII: Normal hearing CN IX,X: Symmetric palate raise  CN XI: 5/5 shoulder shrug CN XII: Symmetric tongue protrusion  UE and LE strength 5/5 Normal sensation in UE and LE bilaterally      07/24/2023    4:19 PM 06/08/2023    3:48 PM 06/01/2023    1:21 PM  PHQ9 SCORE ONLY  PHQ-9 Total Score 9 8 6       ASSESSMENT/PLAN:   No  problem-specific Assessment & Plan notes found for this encounter.   Near syncope: EKG obtained in the office which shows NSR, normal axis without deviation, no ST elevations.  Similar when compared with prior EKG on file.  Low suspicion for cardiac involvement with normal EKG no chest pain or shortness of breath associated with the event.  Low suspicion for seizure without symptomatic history.  Most likely this is related to stress, grief, change in schedule.  Depression: Discussed options with patient and through shared decision making decided to increase her Zoloft to 50 mg daily.  Patient will take 2 pills of her 25 mg tablets at home and then pick up new prescription of 50 mg.  She will need to schedule a follow-up appointment in 4 weeks with PCP.  Glendale Chard, DO [place] [facility]

## 2023-08-24 ENCOUNTER — Other Ambulatory Visit: Payer: Self-pay | Admitting: *Deleted

## 2023-08-24 DIAGNOSIS — M792 Neuralgia and neuritis, unspecified: Secondary | ICD-10-CM

## 2023-08-24 MED ORDER — GABAPENTIN 100 MG PO CAPS: 100.0000 mg | ORAL_CAPSULE | Freq: Every day | ORAL | 0 refills | Status: DC

## 2023-08-29 ENCOUNTER — Other Ambulatory Visit: Payer: Self-pay | Admitting: Student

## 2023-09-02 ENCOUNTER — Other Ambulatory Visit: Payer: Self-pay | Admitting: Student

## 2023-09-02 DIAGNOSIS — M1610 Unilateral primary osteoarthritis, unspecified hip: Secondary | ICD-10-CM

## 2023-09-03 ENCOUNTER — Encounter: Payer: Self-pay | Admitting: Student

## 2023-09-11 ENCOUNTER — Ambulatory Visit: Payer: 59 | Admitting: Student

## 2023-09-19 ENCOUNTER — Ambulatory Visit
Admission: RE | Admit: 2023-09-19 | Discharge: 2023-09-19 | Disposition: A | Discharge status: Home / Self Care | Payer: 59 | Source: Ambulatory Visit | Attending: Family Medicine | Admitting: Family Medicine

## 2023-09-19 ENCOUNTER — Encounter: Payer: Self-pay | Admitting: Family Medicine

## 2023-09-19 ENCOUNTER — Other Ambulatory Visit (HOSPITAL_COMMUNITY): Payer: Self-pay | Admitting: Diagnostic Radiology

## 2023-09-19 ENCOUNTER — Other Ambulatory Visit: Payer: Self-pay | Admitting: Family Medicine

## 2023-09-19 DIAGNOSIS — R599 Enlarged lymph nodes, unspecified: Secondary | ICD-10-CM

## 2023-09-19 DIAGNOSIS — N6321 Unspecified lump in the left breast, upper outer quadrant: Secondary | ICD-10-CM

## 2023-09-19 DIAGNOSIS — C50912 Malignant neoplasm of unspecified site of left female breast: Secondary | ICD-10-CM | POA: Diagnosis not present

## 2023-09-19 DIAGNOSIS — R590 Localized enlarged lymph nodes: Secondary | ICD-10-CM | POA: Diagnosis not present

## 2023-09-19 DIAGNOSIS — C773 Secondary and unspecified malignant neoplasm of axilla and upper limb lymph nodes: Secondary | ICD-10-CM | POA: Diagnosis not present

## 2023-09-19 DIAGNOSIS — D0512 Intraductal carcinoma in situ of left breast: Secondary | ICD-10-CM | POA: Diagnosis not present

## 2023-09-19 HISTORY — PX: BREAST BIOPSY: SHX20

## 2023-09-20 ENCOUNTER — Encounter: Payer: Self-pay | Admitting: Family Medicine

## 2023-09-20 ENCOUNTER — Ambulatory Visit: Payer: Self-pay | Admitting: Family Medicine

## 2023-09-20 LAB — SURGICAL PATHOLOGY

## 2023-09-21 ENCOUNTER — Ambulatory Visit (INDEPENDENT_AMBULATORY_CARE_PROVIDER_SITE_OTHER): Payer: 59 | Admitting: Student

## 2023-09-21 ENCOUNTER — Encounter: Payer: Self-pay | Admitting: Student

## 2023-09-21 VITALS — BP 164/98 | HR 69 | Ht 62.0 in | Wt 197.0 lb

## 2023-09-21 DIAGNOSIS — I10 Essential (primary) hypertension: Secondary | ICD-10-CM

## 2023-09-21 DIAGNOSIS — R4589 Other symptoms and signs involving emotional state: Secondary | ICD-10-CM

## 2023-09-21 DIAGNOSIS — R413 Other amnesia: Secondary | ICD-10-CM | POA: Diagnosis not present

## 2023-09-21 DIAGNOSIS — C50919 Malignant neoplasm of unspecified site of unspecified female breast: Secondary | ICD-10-CM | POA: Diagnosis not present

## 2023-09-21 DIAGNOSIS — F419 Anxiety disorder, unspecified: Secondary | ICD-10-CM

## 2023-09-21 MED ORDER — SERTRALINE HCL 50 MG PO TABS: 50.0000 mg | ORAL_TABLET | Freq: Every day | ORAL | 1 refills | Status: DC

## 2023-09-21 NOTE — Progress Notes (Unsigned)
    SUBJECTIVE:   CHIEF COMPLAINT / HPI:   Breast cancer I received a message from RN at the breast center regarding recent biopsy results showing grade 2 invasive ductal carcinoma.  RN has been unable to reach patient or family to discuss the results and get her scheduled for follow-up. I discussed results with patient today.  Daughters had already seen the result in MyChart. Paternal grandmother had breast cancer.   Memory issues  goals of care Last seen by [facility] neurology on 08/08/2023.  At that time any mental status score 26/30.  They had no concerns for safety issues.  They were considering Aricept but did not think patient needed it at that time.  Since then, daughters have been more concerned about memory issues/ability to manage finances and medications and patient and daughters have elected to start Aricept which she is currently taking and tolerating well. Today, daughter Sue Lush states she would like her mother to move in with her and her husband so that they can help care for her especially with new cancer diagnosis and as patient's husband passed away within the last year. Patient has expressed in the past and during this visit that she sometimes feels like a burden to her family and does not want her daughters taking care of her.  HTN She is unsure what medications she is currently taking but she is prescribed losartan, metoprolol, and amlodipine.  States she feels well today.   PERTINENT  PMH / PSH: age related memory disorder, HTN, severe hip OA, obesity  OBJECTIVE:   BP (!) 164/98   Pulse 69   Ht 5\' 2"  (1.575 m)   Wt 197 lb (89.4 kg)   SpO2 99%   BMI 36.03 kg/m    General: NAD, pleasant, able to participate in exam Cardiac: RRR, no murmurs. Respiratory: CTAB, normal effort, No wheezes, rales or rhonchi Abdomen: Bowel sounds present, nontender, nondistended, no hepatosplenomegaly. Extremities: no edema or cyanosis. Skin: warm and dry, no rashes  noted Neuro: alert, no obvious focal deficits Psych: Normal affect and mood  ASSESSMENT/PLAN:   Depressed mood Has a great support system with family and her church.  Given new diagnosis of breast cancer, would like to go up on Zoloft to 50 mg daily.  Essential hypertension Uncontrolled but unclear if she has been taking her medications.  Daughter brought medications at this visit, she does not need refills at this time. -Continue amlodipine 10 mg daily, losartan 25 mg at bedtime, metoprolol 50 mg twice daily -Return in 2 weeks for follow-up  Infiltrating ductal carcinoma of breast (HCC) I replied back to RN at breast center that family and patient agree to surgery referral as advised. I provided her with daughter Andrea's phone number.  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 Following with neurology.  On Aricept.  Discussed with patient that it is a normal cycle for parents to care for children followed by children caring for parents.  It is apparent that patient does not know her medications.  Daughter Sue Lush plans to fill pillboxes from here on out.  Encouraged daughters and patient to go to upcoming appointment to further discuss treatment options for breast cancer which may help make decisions regarding where she should live.   Plan to follow-up in about 2 weeks to recheck blood pressure, plans for breast cancer treatment and GOC  Dr. Erick Alley, DO [place] [facility]

## 2023-09-21 NOTE — Patient Instructions (Addendum)
It was great to see you! Thank you for allowing me to participate in your care!  I recommend that you always bring your medications to each appointment as this makes it easy to ensure you are on the correct medications and helps [place] not miss when refills are needed.  Our plans for today:  - you should receive a phone call to schedule an appointment to discuss new breast cancer diagnosis. If you don't receive a call within 1 week, let me know - Continue current medications - return in 2-4 weeks, bring inhalers    Take care and seek immediate care sooner if you develop any concerns.   Dr. Erick Alley, DO [facility]

## 2023-09-22 DIAGNOSIS — C50919 Malignant neoplasm of unspecified site of unspecified female breast: Secondary | ICD-10-CM | POA: Insufficient documentation

## 2023-09-22 DIAGNOSIS — F419 Anxiety disorder, unspecified: Secondary | ICD-10-CM | POA: Insufficient documentation

## 2023-09-22 NOTE — Assessment & Plan Note (Signed)
Has a great support system with family and her church.  Given new diagnosis of breast cancer, would like to go up on Zoloft to 50 mg daily.

## 2023-09-22 NOTE — Assessment & Plan Note (Signed)
Following with neurology.  On Aricept.  Discussed with patient that it is a normal cycle for parents to care for children followed by children caring for parents.  It is apparent that patient does not know her medications.  Daughter Sue Lush plans to fill pillboxes from here on out.  Encouraged daughters and patient to go to upcoming appointment to further discuss treatment options for breast cancer which may help make decisions regarding where she should live.

## 2023-09-22 NOTE — Assessment & Plan Note (Signed)
I replied back to RN at breast center that family and patient agree to surgery referral as advised. I provided her with daughter Andrea's phone number.

## 2023-09-22 NOTE — Assessment & Plan Note (Signed)
Uncontrolled but unclear if she has been taking her medications.  Daughter brought medications at this visit, she does not need refills at this time. -Continue amlodipine 10 mg daily, losartan 25 mg at bedtime, metoprolol 50 mg twice daily -Return in 2 weeks for follow-up

## 2023-09-24 ENCOUNTER — Telehealth: Payer: Self-pay | Admitting: Student

## 2023-09-24 NOTE — Telephone Encounter (Signed)
To avoid confusion, here is the latest staff message from RN at breast center to me:  Good morning Dr. Yetta Barre,   Ms. Febles is scheduled to see Dr. Carman Ching on Friday 12/13 @ 9:50 AM and I have spoken with her daughter Sue Lush.   He will arrange referrals to [facility] for medical & radiation oncology.   Thank you,   Rene Kocher

## 2023-09-27 ENCOUNTER — Telehealth: Payer: Self-pay | Admitting: Hematology

## 2023-09-27 NOTE — Telephone Encounter (Signed)
Scheduled appointments per referral. Patient is aware of the appointment time and date as well as the address. Patient was informed to arrive 10-15 minutes prior with updated insurance information. All questions were answered.

## 2023-09-28 ENCOUNTER — Inpatient Hospital Stay: Payer: 59 | Attending: Hematology | Admitting: Hematology

## 2023-09-28 ENCOUNTER — Encounter: Payer: Self-pay | Admitting: Hematology

## 2023-09-28 ENCOUNTER — Other Ambulatory Visit: Payer: Self-pay | Admitting: Surgery

## 2023-09-28 ENCOUNTER — Inpatient Hospital Stay: Payer: 59

## 2023-09-28 VITALS — BP 167/81 | HR 67 | Temp 98.6°F | Resp 17 | Ht 62.0 in | Wt 196.6 lb

## 2023-09-28 DIAGNOSIS — F0393 Unspecified dementia, unspecified severity, with mood disturbance: Secondary | ICD-10-CM | POA: Insufficient documentation

## 2023-09-28 DIAGNOSIS — Z79899 Other long term (current) drug therapy: Secondary | ICD-10-CM | POA: Diagnosis not present

## 2023-09-28 DIAGNOSIS — C773 Secondary and unspecified malignant neoplasm of axilla and upper limb lymph nodes: Secondary | ICD-10-CM | POA: Insufficient documentation

## 2023-09-28 DIAGNOSIS — C50412 Malignant neoplasm of upper-outer quadrant of left female breast: Secondary | ICD-10-CM | POA: Diagnosis not present

## 2023-09-28 DIAGNOSIS — C50912 Malignant neoplasm of unspecified site of left female breast: Secondary | ICD-10-CM

## 2023-09-28 DIAGNOSIS — M8580 Other specified disorders of bone density and structure, unspecified site: Secondary | ICD-10-CM | POA: Insufficient documentation

## 2023-09-28 DIAGNOSIS — I10 Essential (primary) hypertension: Secondary | ICD-10-CM | POA: Diagnosis not present

## 2023-09-28 DIAGNOSIS — Z170 Estrogen receptor positive status [ER+]: Secondary | ICD-10-CM | POA: Insufficient documentation

## 2023-09-28 DIAGNOSIS — F32A Depression, unspecified: Secondary | ICD-10-CM | POA: Diagnosis not present

## 2023-09-28 NOTE — Progress Notes (Signed)
[facility]   [phone] Fax:([phone]) [phone]   Clinic New Consult Note   Patient Care Team: Erick Alley, DO as PCP - General (Family Medicine) Runell Gess, MD as PCP - Cardiology (Cardiology) York Spaniel, MD (Inactive) as Consulting Physician (Neurology) 09/28/2023  CHIEF COMPLAINTS/PURPOSE OF CONSULTATION:  Newly diagnosed left breast cancer   REFERRING PHYSICIAN: Breast center   Discussed the use of AI scribe software for clinical note transcription with the patient, who gave verbal consent to proceed.  History of Present Illness   A 67 year old patient with a history of high blood pressure, arthritis, parathyroidism, osteopenia, and memory loss presents for a new consult for a recent diagnosis of breast cancer.  She presents to the clinic with her daughter.    The patient first noticed a lump in her left breast during the summer. The lump was not painful but felt different.  Her previous last mammogram was in 2021.  A mammogram and ultrasound were ordered by her PCP but patient never made it until December 4th 2024, which showed a 3.0 x 2.4 x 2.5 cm mass at the 2 o'clock position 15 cm from nipple of left breast, and multiple enlarged lymph nodes with largest one 1 cm.  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  The patient has a family history of breast cancer in paternal grandmother. The patient is postmenopausal and has three children, all of whom were breastfed. The patient is currently on multiple medications including amlodipine, Aricept, gabapentin, losartan, meloxicam, metoprolol, cholesterol medicine, and Zoloft.  Patient and her family has noticed memory loss for the past few years, she was evaluated by neurologist Dr. Eugenie Norrie and was started on medicine.  She is still able to  do her ADLs, and most normal activities such as shopping, but her daughter has started taking over her medication management.  She also has depression since she lost her husband earlier this year.      MEDICAL HISTORY:  Past Medical History:  Diagnosis Date   Allergy    Anxiety    Arthritis    degenerative in back and hips   Atypical chest pain 05/03/2015   Low risk Myoview 2016, normal LVF by echo Dec 2020   Carpal tunnel syndrome, bilateral    Chest pain    Depression    Elevated PTHrP level 06/19/2018   Syncope   Feeling grief 12/30/2015   Heart murmur    age 64 said had heart murmur.   Hyperlipidemia    Hyperparathyroidism (HCC) 07/25/2019   Hypertension    Normal cardiac stress test    Myoview stress test   OAB (overactive bladder) 09/01/2015   Osteopenia 05/2018   T score -1.1 FRAX 2.5%/ 0.1%   Subclinical hyperthyroidism 05/08/2007   Qualifier: Diagnosis of  By: Jimmey Ralph MD, Victorino Dike      SURGICAL HISTORY: Past Surgical History:  Procedure Laterality Date   BREAST BIOPSY Left 09/19/2023   x's 2   BREAST BIOPSY Left 09/19/2023   [place] LT BREAST BX W LOC DEV 1ST LESION IMG BX SPEC US GUIDE 09/19/2023 [facility]   COLONOSCOPY     POLYPECTOMY     TOTAL HIP ARTHROPLASTY Left 10/01/2013   DR ROWAN   TOTAL HIP ARTHROPLASTY Left 10/01/2013   Procedure: TOTAL HIP ARTHROPLASTY;  Surgeon: [name], MD;  Location:  [facility] OR;  Service: Orthopedics;  Laterality: Left;    SOCIAL HISTORY: Social History   Socioeconomic History   Marital status: Divorced    Spouse name: Not on file   Number of children: 3   Years of education: 31   Highest education level: Master's degree (e.g., MA, MS, MEng, MEd, MSW, MBA)  Occupational History   Occupation: Control and instrumentation engineer: [employer]    Comment: pt is an Event organiser for the parishioner's service at [employer]   Tobacco Use   Smoking status: Never    Passive exposure: Never   Smokeless tobacco: Never   Vaping Use   Vaping status: Never Used  Substance and Sexual Activity   Alcohol use: No    Alcohol/week: 0.0 standard drinks of alcohol   Drug use: No   Sexual activity: Yes    Partners: Male    Birth control/protection: Post-menopausal  Other Topics Concern   Not on file  Social History Narrative   Patient lives alone in [place].    Patient has 3 children and has grandchildren.    Patient enjoys reading, spending time with her family, and very involved in her church.    Social Drivers of Health   Financial Resource Strain: Patient Declined (01/08/2023)   [facility]   Overall Financial Resource Strain (CARDIA)    Difficulty of Paying Living Expenses: Patient declined  Food Insecurity: No Food Insecurity (06/01/2023)   Hunger Vital Sign    Worried About Running Out of Food in the Last Year: Never true    Ran Out of Food in the Last Year: Never true  Transportation Needs: No Transportation Needs (06/01/2023)   PRAPARE - Administrator, Civil Service (Medical): No    Lack of Transportation (Non-Medical): No  Physical Activity: Insufficiently Active (06/01/2023)   Exercise Vital Sign    Days of Exercise per Week: 3 days    Minutes of Exercise per Session: 30 min  Stress: No Stress Concern Present (06/01/2023)   [employer] of Occupational Health - Occupational Stress Questionnaire    Feeling of Stress : Not at all  Social Connections: Moderately Integrated (06/01/2023)   Social Connection and Isolation Panel [NHANES]    Frequency of Communication with Friends and Family: More than three times a week    Frequency of Social Gatherings with Friends and Family: Three times a week    Attends Religious Services: More than 4 times per year    Active Member of Clubs or Organizations: Yes    Attends Banker Meetings: 1 to 4 times per year    Marital Status: Widowed  Intimate Partner Violence: Not At Risk (06/01/2023)    Humiliation, Afraid, Rape, and Kick questionnaire    Fear of Current or Ex-Partner: No    Emotionally Abused: No    Physically Abused: No    Sexually Abused: No    FAMILY HISTORY: Family History  Problem Relation Age of Onset   Hypertension Mother    Diabetes Father    Hyperlipidemia Brother    Breast cancer Paternal Grandmother 1 - 55   Heart attack Neg Hx    Sudden death Neg Hx    Colon cancer Neg Hx    Hypercalcemia Neg Hx     ALLERGIES:  is allergic to hctz [hydrochlorothiazide], hydrocodone, and simvastatin.  MEDICATIONS:  Current Outpatient Medications  Medication Sig Dispense Refill   albuterol (VENTOLIN HFA) 108 (90 Base) MCG/ACT  inhaler USE 2 INHALATIONS BY MOUTH EVERY 6 HOURS AS NEEDED FOR WHEEZING  OR SHORTNESS OF BREATH 26.8 g 2   amLODipine (NORVASC) 10 MG tablet TAKE 1 TABLET BY MOUTH EVERY  MORNING 100 tablet 2   Budesonide 90 MCG/ACT inhaler Inhale 1 puff into the lungs 2 (two) times daily. 1 each 0   capsaicin (ZOSTRIX) 0.025 % cream Apply topically 2 (two) times daily. 60 g 0   cholecalciferol (VITAMIN D3) 25 MCG (1000 UNIT) tablet Take 1 tablet (1,000 Units total) by mouth daily. 7 tablet 0   donepezil (ARICEPT ODT) 10 MG disintegrating tablet Take 10 mg by mouth at bedtime.     fluticasone (FLONASE) 50 MCG/ACT nasal spray USE 1 SPRAY IN BOTH NOSTRILS  DAILY 16 g 0   fluticasone (FLOVENT HFA) 110 MCG/ACT inhaler Inhale 1 puff into the lungs daily as needed. 1 each 12   gabapentin (NEURONTIN) 100 MG capsule Take 1 capsule (100 mg total) by mouth daily. 90 capsule 0   loratadine (EQ ALLERGY RELIEF) 10 MG tablet Take 1 tablet (10 mg total) by mouth daily. (Patient not taking: Reported on 09/21/2023) 90 tablet 3   losartan (COZAAR) 25 MG tablet TAKE 1 TABLET BY MOUTH AT  BEDTIME 30 tablet 11   meloxicam (MOBIC) 15 MG tablet Take 1 tablet by mouth once daily 30 tablet 0   metoprolol tartrate (LOPRESSOR) 50 MG tablet TAKE 1 TABLET BY MOUTH TWICE  DAILY 200 tablet 2    rosuvastatin (CRESTOR) 5 MG tablet Take one tablet by mouth every Monday and Friday. 20 tablet 4   sertraline (ZOLOFT) 50 MG tablet Take 1 tablet (50 mg total) by mouth daily. 90 tablet 1   No current facility-administered medications for this visit.    REVIEW OF SYSTEMS:   Constitutional: Denies fevers, chills or abnormal night sweats Eyes: Denies blurriness of vision, double vision or watery eyes Ears, nose, mouth, throat, and face: Denies mucositis or sore throat Respiratory: Denies cough, dyspnea or wheezes Cardiovascular: Denies palpitation, chest discomfort or lower extremity swelling Gastrointestinal:  Denies nausea, heartburn or change in bowel habits Skin: Denies abnormal skin rashes Lymphatics: Denies new lymphadenopathy or easy bruising Neurological:Denies numbness, tingling or new weaknesses Behavioral/Psych: Mood is stable, no new changes  All other systems were reviewed with the patient and are negative.  PHYSICAL EXAMINATION: ECOG PERFORMANCE STATUS: 0 - Asymptomatic  Vitals:   09/28/23 0804 09/28/23 0807  BP: (!) 164/84 (!) 167/81  Pulse: 67   Resp: 17   Temp: 98.6 F (37 C)   SpO2: 100%    Filed Weights   09/28/23 0804  Weight: 196 lb 9.6 oz (89.2 kg)    GENERAL:alert, no distress and comfortable SKIN: skin color, texture, turgor are normal, no rashes or significant lesions EYES: normal, conjunctiva are pink and non-injected, sclera clear OROPHARYNX:no exudate, no erythema and lips, buccal mucosa, and tongue normal  NECK: supple, thyroid normal size, non-tender, without nodularity LYMPH:  no palpable lymphadenopathy in the cervical, axillary or inguinal LUNGS: clear to auscultation and percussion with normal breathing effort HEART: regular rate & rhythm and no murmurs and no lower extremity edema ABDOMEN:abdomen soft, non-tender and normal bowel sounds Musculoskeletal:no cyanosis of digits and no clubbing  PSYCH: alert & oriented x 3 with fluent  speech NEURO: no focal motor/sensory deficits BREAST: Left breast mass palpable, approximately 2-3 cm in size, located at the 2:00 position about 15cm from nipple, non-tender. Left axillary lymph nodes palpable, at least one approximately  2 cm in size, movable.  Right breast and axilla were negative.     LABORATORY DATA:  I have reviewed the data as listed    Latest Ref Rng & Units 08/25/2021   10:25 AM 10/30/2019    2:53 PM 07/25/2019    2:13 PM  CBC  WBC 3.4 - 10.8 x10E3/uL 6.8  8.2  7.0   Hemoglobin 11.1 - 15.9 g/dL 47.8  29.5  62.1   Hematocrit 34.0 - 46.6 % 36.3  38.9  38.5   Platelets 150 - 450 x10E3/uL 306  304  298       Latest Ref Rng & Units 12/29/2021    9:53 AM 11/30/2021   10:40 AM 04/06/2021    8:06 AM  CMP  Glucose 70 - 99 mg/dL 92  84    BUN 8 - 27 mg/dL 7  6    Creatinine 3.08 - 1.00 mg/dL 6.57  8.46    Sodium 962 - 144 mmol/L 141  144    Potassium 3.5 - 5.2 mmol/L 4.1  4.0    Chloride 96 - 106 mmol/L 106  107    CO2 20 - 29 mmol/L 24  24    Calcium 8.7 - 10.3 mg/dL 95.2  84.1  32.4   Total Protein 6.0 - 8.5 g/dL  6.9  7.2   Total Bilirubin 0.0 - 1.2 mg/dL  0.2  0.4   Alkaline Phos 44 - 121 IU/L  141  159    131   AST 0 - 40 IU/L  20  16   ALT 0 - 32 IU/L  28  13      RADIOGRAPHIC STUDIES: I have personally reviewed the radiological images as listed and agreed with the findings in the report. [place] LT BREAST BX W LOC DEV 1ST LESION IMG BX SPEC US GUIDE Addendum Date: 09/25/2023 ADDENDUM REPORT: 09/25/2023 09:20 ADDENDUM: Pathology revealed GRADE II INVASIVE DUCTAL CARCINOMA, LYMPHOVASCULAR INVASION: IDENTIFIED of the LEFT breast mass, 2 o'clock, (coil clip). This was found to be concordant by Dr. Norva Pavlov. Pathology revealed GRADE II INVASIVE DUCTAL CARCINOMA, NO LYMPHOID TISSUE IDENTIFIED of the LEFT axilla, (hydromark clip). This was found to be concordant by Dr. Norva Pavlov. Pathology results were discussed with the patient and her daughter, Elesha Wydra by Dr. Erick Alley, DO,  [facility] at a scheduled appointment on Friday September 21, 2023. I contacted the patient's daughter Salisa Banahan on September 24, 2023 to provide my contact information and answer questions. Surgical consultation has been arranged with Dr. Carman Ching at [facility] on September 28, 2023. Pathology results reported by Rene Kocher, RN on 09/24/2023. Electronically Signed   By: Norva Pavlov M.D.   On: 09/25/2023 09:20   Result Date: 09/25/2023 CLINICAL DATA:  Patient presents for ultrasound-guided core biopsy of LEFT breast mass and LEFT axillary mass EXAM: [place] AXILLARY CORE BIOPSY LEFT ULTRASOUND GUIDED LEFT BREAST CORE NEEDLE BIOPSY COMPARISON:  Previous exam(s). PROCEDURE: I met with the patient and we discussed the procedure of ultrasound-guided biopsy, including benefits and alternatives. We discussed the high likelihood of a successful procedure. We discussed the risks of the procedure, including infection, bleeding, tissue injury, clip migration, and inadequate sampling. Informed written consent was given. The usual time-out protocol was performed immediately prior to the procedure. Prior to biopsy, targeted ultrasound is performed of the breast and axilla. Within the axilla, the largest discrete mass is 1.6 x 1.5 x 1.5 centimeters. Margins are  indistinct, and no discrete hilum is identified. Considerations include a second primary or a completely replaced metastatic lymph node. This mass is targeted for biopsy. In addition to this mass, 3 axillary lymph nodes with cortical thickening are identified. Site 1: LEFT breast 2 o'clock. Lesion quadrant: UPPER OUTER QUADRANT, coil clip Using sterile technique and 1% lidocaine as local anesthetic, under direct ultrasound visualization, a 14 gauge spring-loaded device was used to perform biopsy of mass in the 2 o'clock location of the LEFT breast using a LATERAL to MEDIAL approach. At the  conclusion of the procedure coil tissue marker clip was deployed into the biopsy cavity. Site 2: LEFT axilla.  HydroMARK Using sterile technique and lidocaine and lidocaine with epinephrine as local anesthetic, under direct ultrasound visualization, a 14 gauge spring-loaded device was used to perform biopsy of largest mass in the LOWER LEFT axilla using a LATERAL approach. At the conclusion of the procedure [facility] tissue marker clip was deployed into the biopsy cavity. Follow-up 2-view mammogram was performed and dictated separately. IMPRESSION: Ultrasound guided biopsy of LEFT breast mass and LEFT axillary mass. No apparent complications. Electronically Signed: By: Norva Pavlov M.D. On: 09/19/2023 15:57   [place] AXILLARY NODE CORE BIOPSY LEFT Addendum Date: 09/25/2023 ADDENDUM REPORT: 09/25/2023 09:20 ADDENDUM: Pathology revealed GRADE II INVASIVE DUCTAL CARCINOMA, LYMPHOVASCULAR INVASION: IDENTIFIED of the LEFT breast mass, 2 o'clock, (coil clip). This was found to be concordant by Dr. Norva Pavlov. Pathology revealed GRADE II INVASIVE DUCTAL CARCINOMA, NO LYMPHOID TISSUE IDENTIFIED of the LEFT axilla, (hydromark clip). This was found to be concordant by Dr. Norva Pavlov. Pathology results were discussed with the patient and her daughter, Alla Brandenberger by Dr. Erick Alley, DO, [facility] at a scheduled appointment on Friday September 21, 2023. I contacted the patient's daughter Ahsha Haran on September 24, 2023 to provide my contact information and answer questions. Surgical consultation has been arranged with Dr. Carman Ching at [facility] on September 28, 2023. Pathology results reported by Rene Kocher, RN on 09/24/2023. Electronically Signed   By: Norva Pavlov M.D.   On: 09/25/2023 09:20   Result Date: 09/25/2023 CLINICAL DATA:  Patient presents for ultrasound-guided core biopsy of LEFT breast mass and LEFT axillary mass EXAM: [place] AXILLARY CORE BIOPSY LEFT  ULTRASOUND GUIDED LEFT BREAST CORE NEEDLE BIOPSY COMPARISON:  Previous exam(s). PROCEDURE: I met with the patient and we discussed the procedure of ultrasound-guided biopsy, including benefits and alternatives. We discussed the high likelihood of a successful procedure. We discussed the risks of the procedure, including infection, bleeding, tissue injury, clip migration, and inadequate sampling. Informed written consent was given. The usual time-out protocol was performed immediately prior to the procedure. Prior to biopsy, targeted ultrasound is performed of the breast and axilla. Within the axilla, the largest discrete mass is 1.6 x 1.5 x 1.5 centimeters. Margins are indistinct, and no discrete hilum is identified. Considerations include a second primary or a completely replaced metastatic lymph node. This mass is targeted for biopsy. In addition to this mass, 3 axillary lymph nodes with cortical thickening are identified. Site 1: LEFT breast 2 o'clock. Lesion quadrant: UPPER OUTER QUADRANT, coil clip Using sterile technique and 1% lidocaine as local anesthetic, under direct ultrasound visualization, a 14 gauge spring-loaded device was used to perform biopsy of mass in the 2 o'clock location of the LEFT breast using a LATERAL to MEDIAL approach. At the conclusion of the procedure coil tissue marker clip was deployed into the biopsy cavity.  Site 2: LEFT axilla.  HydroMARK Using sterile technique and lidocaine and lidocaine with epinephrine as local anesthetic, under direct ultrasound visualization, a 14 gauge spring-loaded device was used to perform biopsy of largest mass in the LOWER LEFT axilla using a LATERAL approach. At the conclusion of the procedure [facility] tissue marker clip was deployed into the biopsy cavity. Follow-up 2-view mammogram was performed and dictated separately. IMPRESSION: Ultrasound guided biopsy of LEFT breast mass and LEFT axillary mass. No apparent complications. Electronically Signed:  By: Norva Pavlov M.D. On: 09/19/2023 15:57   MM CLIP PLACEMENT LEFT Result Date: 09/19/2023 CLINICAL DATA:  Status post ultrasound-guided core biopsy of LEFT breast mass and axillary mass. EXAM: 3D DIAGNOSTIC LEFT MAMMOGRAM POST ULTRASOUND BIOPSY x2 COMPARISON:  Previous exam(s). FINDINGS: 3D Mammographic images were obtained following ultrasound guided biopsy of mass in the 2 o'clock location of the LEFT breast and placement of a coil shaped clip. The biopsy marking clip is in expected position at the site of biopsy. Following biopsy of LEFT axillary mass, a HydroMARK was placed and is identified in the expected location. IMPRESSION: Tissue marker clips are in the expected locations after biopsy. Final Assessment: Post Procedure Mammograms for Marker Placement Electronically Signed   By: Norva Pavlov M.D.   On: 09/19/2023 16:07   MM 3D DIAGNOSTIC MAMMOGRAM BILATERAL BREAST Result Date: 09/19/2023 CLINICAL DATA:  67 year old female presenting with a palpable area of concern in the left breast, felt for the past few months. She is also due for annual screening. EXAM: DIGITAL DIAGNOSTIC BILATERAL MAMMOGRAM WITH TOMOSYNTHESIS AND CAD; ULTRASOUND LEFT BREAST LIMITED TECHNIQUE: Bilateral digital diagnostic mammography and breast tomosynthesis was performed. The images were evaluated with computer-aided detection. ; Targeted ultrasound examination of the left breast was performed. COMPARISON:  Previous exam(s). ACR Breast Density Category b: There are scattered areas of fibroglandular density. FINDINGS: MAMMOGRAM: Subjacent to the palpable marker in the left breast, there is a 2.7 cm spiculated mass in the upper-outer quadrant posterior depth (spot MLO image 56/76). There are also slightly enlarged left axillary lymph nodes compared to the right axilla. No additional suspicious mass, calcification, or other findings are identified in bilateral breasts. ULTRASOUND: On physical exam, there is firm immobile  mass in the left breast 2 o'clock position. This corresponds with the patient's area of concern. Targeted left axillary ultrasound in the area of palpable concern was performed. At 2 o'clock 15 cm from the nipple, there is an irregular hypoechoic mass that measures 3.0 x 2.4 x 2.5 cm. There is mild internal vascularity. This corresponds with the spiculated mass seen on mammogram and is highly concerning for malignancy. Targeted left axillary ultrasound demonstrates multiple enlarged lymph nodes. The largest lymph node measures up to 10 mm in cortical thickness. IMPRESSION: 1. LEFT breast 3.0 cm palpable mass in the 2 o'clock position is highly concerning for malignancy. 2. LEFT axillary lymphadenopathy, raising concern for metastatic disease. 3. No evidence of malignancy in the RIGHT breast. RECOMMENDATION: 1. Left breast ultrasound-guided biopsy (1 site). 2. Left axillary ultrasound-guided lymph node biopsy (1 site). I have discussed the findings and recommendations with the patient. The biopsy procedure was discussed with the patient and questions were answered. Patient expressed their understanding of the biopsy recommendation. Patient will be scheduled for biopsy at her earliest convenience by the schedulers. Ordering provider will be notified. If applicable, a reminder letter will be sent to the patient regarding the next appointment. BI-RADS CATEGORY  5: Highly suggestive of malignancy. Electronically Signed  By: Jacob Moores M.D.   On: 09/19/2023 12:00   [place] LIMITED ULTRASOUND INCLUDING AXILLA LEFT BREAST  Result Date: 09/19/2023 CLINICAL DATA:  67 year old female presenting with a palpable area of concern in the left breast, felt for the past few months. She is also due for annual screening. EXAM: DIGITAL DIAGNOSTIC BILATERAL MAMMOGRAM WITH TOMOSYNTHESIS AND CAD; ULTRASOUND LEFT BREAST LIMITED TECHNIQUE: Bilateral digital diagnostic mammography and breast tomosynthesis was performed. The images were  evaluated with computer-aided detection. ; Targeted ultrasound examination of the left breast was performed. COMPARISON:  Previous exam(s). ACR Breast Density Category b: There are scattered areas of fibroglandular density. FINDINGS: MAMMOGRAM: Subjacent to the palpable marker in the left breast, there is a 2.7 cm spiculated mass in the upper-outer quadrant posterior depth (spot MLO image 56/76). There are also slightly enlarged left axillary lymph nodes compared to the right axilla. No additional suspicious mass, calcification, or other findings are identified in bilateral breasts. ULTRASOUND: On physical exam, there is firm immobile mass in the left breast 2 o'clock position. This corresponds with the patient's area of concern. Targeted left axillary ultrasound in the area of palpable concern was performed. At 2 o'clock 15 cm from the nipple, there is an irregular hypoechoic mass that measures 3.0 x 2.4 x 2.5 cm. There is mild internal vascularity. This corresponds with the spiculated mass seen on mammogram and is highly concerning for malignancy. Targeted left axillary ultrasound demonstrates multiple enlarged lymph nodes. The largest lymph node measures up to 10 mm in cortical thickness. IMPRESSION: 1. LEFT breast 3.0 cm palpable mass in the 2 o'clock position is highly concerning for malignancy. 2. LEFT axillary lymphadenopathy, raising concern for metastatic disease. 3. No evidence of malignancy in the RIGHT breast. RECOMMENDATION: 1. Left breast ultrasound-guided biopsy (1 site). 2. Left axillary ultrasound-guided lymph node biopsy (1 site). I have discussed the findings and recommendations with the patient. The biopsy procedure was discussed with the patient and questions were answered. Patient expressed their understanding of the biopsy recommendation. Patient will be scheduled for biopsy at her earliest convenience by the schedulers. Ordering provider will be notified. If applicable, a reminder letter will  be sent to the patient regarding the next appointment. BI-RADS CATEGORY  5: Highly suggestive of malignancy. Electronically Signed   By: Jacob Moores M.D.   On: 09/19/2023 12:00    ASSESSMENT & PLAN:  67 year old female with a palpable left breast mass    Malignant neoplasm of upper-outer quadrant of left breast, invasive adenocarcinoma, cT2N1M0, stage IIA, ER+/PR+/HER2-, G2 Newly diagnosed invasive ductal carcinoma, 3.0 cm, upper outer quadrant, high grade (grade 2), ER/PR positive, HER2 negative, with metastasis to at least one lymph node, but ultrasound showed multiple abnormal axillary lymph nodes. Family history of breast cancer (paternal grandmother). Discussed risks of metastasis, benefits of surgery, and potential need for chemotherapy based on lymph node involvement. Patient prefers to avoid chemotherapy due to existing memory issues. -She has appointment with breast surgeon Dr. Vedia Coffer, later today. -We discussed the indication for neoadjuvant or adjuvant chemotherapy, if she has more than 3 positive lymph nodes, orl we can order Oncotype Dx if she has 1-3 positive lymph nodes on surgical sample and RS>25 -Due to her dementia, I am concerned about chemotherapy induced cognitively declining and her quality of life. - Discuss anti-estrogen therapy post-surgery, she is open to it.  She would likely also benefit from adjuvant CDK4/6 inhibitor - Schedule whole body scan if >4 lymph nodes are positive -will review the  treatment plan with Dr. Magnus Ivan  Dementia Memory loss for approximately three years, worsened after husband's death in 2023-03-19. Managed by neurologist and she is on Aricept. Daughter manages medications.  Depression Managed with Zoloft, recently increased by primary care physician.  Hypertension Chronic condition managed with medication.  Osteopenia Chronic condition.  Plan -She is scheduled to see surgeon Dr. Magnus Ivan -Likely upfront surgery, if she has 1-3 positive  lymph nodes on surgical sample, will order Oncotype DX.  If she has 4 or more positive lymph nodes, I will see her back after surgery to finalized her decision on chemotherapy     No orders of the defined types were placed in this encounter.   All questions were answered. The patient knows to call the clinic with any problems, questions or concerns. I spent 40 minutes counseling the patient face to face. The total time spent in the appointment was 50 minutes and more than 50% was on counseling.     Malachy Mood, MD 09/28/2023 1:04 PM

## 2023-10-01 ENCOUNTER — Encounter: Payer: Self-pay | Admitting: *Deleted

## 2023-10-01 ENCOUNTER — Other Ambulatory Visit: Payer: Self-pay | Admitting: Surgery

## 2023-10-01 ENCOUNTER — Encounter (HOSPITAL_BASED_OUTPATIENT_CLINIC_OR_DEPARTMENT_OTHER): Payer: Self-pay | Admitting: Surgery

## 2023-10-01 ENCOUNTER — Telehealth: Payer: Self-pay | Admitting: *Deleted

## 2023-10-01 ENCOUNTER — Other Ambulatory Visit: Payer: Self-pay

## 2023-10-01 DIAGNOSIS — C50912 Malignant neoplasm of unspecified site of left female breast: Secondary | ICD-10-CM

## 2023-10-01 DIAGNOSIS — C50412 Malignant neoplasm of upper-outer quadrant of left female breast: Secondary | ICD-10-CM

## 2023-10-01 MED ORDER — ENSURE PRE-SURGERY PO LIQD: 296.0000 mL | Freq: Once | ORAL | Status: DC

## 2023-10-01 MED ORDER — CHLORHEXIDINE GLUCONATE CLOTH 2 % EX PADS: 6.0000 | MEDICATED_PAD | Freq: Once | CUTANEOUS | Status: DC

## 2023-10-01 NOTE — Progress Notes (Signed)

## 2023-10-01 NOTE — Telephone Encounter (Signed)
Spoke to pt regarding navigation resources and provided contact information. Denies questions or concerns regarding dx or treatment care plan. Encourage pt to call with needs. Received verbal understanding. 

## 2023-10-01 NOTE — Progress Notes (Signed)
   10/01/23 1109  Pre-op Phone Call  Surgery Date Verified 10/04/23  Arrival Time Verified 0845  Surgery Location Verified [facility]   Medical History Reviewed Yes  Is the patient taking a GLP-1 receptor agonist? No  Does the patient have diabetes? No diagnosis of diabetes  Do you have a history of heart problems? No  Does patient have other implanted devices? No  Patient Teaching Enhanced Recovery;Pre / Post Procedure  Patient educated about smoking cessation 24 hours prior to surgery. N/A Non-Smoker  Patient verbalizes understanding of bowel prep? N/A  THA/TKA patients only:  By your surgery date, will you have been taking narcotics for 90 days or greater? No  Med Rec Completed Yes  Take the Following Meds the Morning of Surgery metoprolol, norvasc and zolfoft  Recent  Lab Work, EKG, CXR? No  NPO (Including gum & candy) After midnight  Allowed clear liquids Water;Black Coffee Only (no creamer, milk or cream including half and half);Gatorade  (diabetics please choose diet or no sugar options)  Patient instructed to stop clear liquids including Carb loading drink at: 0745  Stop Solids, Milk, Candy, and Gum STARTING AT MIDNIGHT  Did patient view EMMI videos? No  Responsible adult to drive and be with you for 24 hours? Yes  Name & Phone Number for Ride/Caregiver daughter Altamease Oiler, money, nail polish or make-up.  No lotions, powders, perfumes. No shaving  48 hrs. prior to surgery. Yes  Contacts, Dentures & Glasses Will Have to be Removed Before OR. Yes  Please bring your ID and Insurance Card the morning of your surgery. (Surgery Centers Only) Yes  Bring any papers or x-rays with you that your surgeon gave you. Yes  Instructed to contact the location of procedure/ provider if they or anyone in their household develops symptoms or tests positive for COVID-19, has close contact with someone who tests positive for COVID, or has known exposure to any contagious illness. Yes  Call this  number the morning of surgery  with any problems that may cancel your surgery. [phone]  Covid-19 Assessment  Have you had a positive COVID-19 test within the previous 90 days? No  COVID Testing Guidance Proceed with the additional questions.  Patient's surgery required a COVID-19 test (cardiothoracic, complex ENT, and bronchoscopies/ EBUS) No  Have you been unmasked and in close contact with anyone with COVID-19 or COVID-19 symptoms within the past 10 days? No  Do you or anyone in your household currently have any COVID-19 symptoms? No

## 2023-10-02 ENCOUNTER — Telehealth: Payer: Self-pay | Admitting: Radiation Oncology

## 2023-10-02 NOTE — Telephone Encounter (Signed)
Unable to leave message on patient's number. Left message for patient's daughter to call back to schedule consult per 12/16 referral.

## 2023-10-03 ENCOUNTER — Ambulatory Visit
Admission: RE | Admit: 2023-10-03 | Discharge: 2023-10-03 | Disposition: A | Discharge status: Home / Self Care | Payer: 59 | Source: Ambulatory Visit | Attending: Surgery | Admitting: Surgery

## 2023-10-03 ENCOUNTER — Other Ambulatory Visit: Payer: Self-pay | Admitting: Surgery

## 2023-10-03 DIAGNOSIS — C773 Secondary and unspecified malignant neoplasm of axilla and upper limb lymph nodes: Secondary | ICD-10-CM | POA: Diagnosis not present

## 2023-10-03 DIAGNOSIS — C50912 Malignant neoplasm of unspecified site of left female breast: Secondary | ICD-10-CM

## 2023-10-03 DIAGNOSIS — C50412 Malignant neoplasm of upper-outer quadrant of left female breast: Secondary | ICD-10-CM | POA: Diagnosis not present

## 2023-10-03 HISTORY — PX: BREAST BIOPSY: SHX20

## 2023-10-03 NOTE — H&P (Signed)
REFERRING PHYSICIAN: Stacie Acres,* PROVIDER: Wayne Both, MD MRN: [id number] DOB: 11/27/1955  Subjective   Chief Complaint: New Consultation (LFT BREAST)  History of Present Illness: [name] is a 67 y.o. female who is seen today as an office consultation for evaluation of New Consultation (LFT BREAST)  This is a 67 year old female who is referred here for a newly diagnosed invasive left breast cancer. This is actually found on examination in July and then follow-up films confirmed the 3 cm x 2.4 cm x 2.5 cm mass at the 2 o'clock position of the left breast toward the axilla. Imaging also showed at least greater than 3 lymph nodes enlarged in the axilla. She underwent a biopsy of the mass showing an invasive ductal carcinoma which was 100% ER and PR positive and HER2 negative. One of the lymph nodes was also biopsy showing invasive ductal carcinoma. She is otherwise without complaints. She denies nipple discharge. She has had no previous history of breast issues or breast cancer. There is a family history of her paternal grandmother with breast cancer. She has no cardiopulmonary issues  Review of Systems: A complete review of systems was obtained from the patient. I have reviewed this information and discussed as appropriate with the patient. See HPI as well for other ROS.  ROS   Medical History: Past Medical History:  Diagnosis Date  Anxiety  Arthritis  History of cancer  Hypertension   There is no problem list on file for this patient.  Past Surgical History:  Procedure Laterality Date  JOINT REPLACEMENT    Allergies  Allergen Reactions  Hydrochlorothiazide (Bulk) Other (See Comments)  SYNCOPE  Hydrocodone Other (See Comments)  MOOD DISTURBANCE  Simvastatin Other (See Comments)  MUSCLE ACHES   No current outpatient medications on file prior to visit.   No current facility-administered medications on file prior to visit.   Family History   Problem Relation Age of Onset  High blood pressure (Hypertension) Mother  Breast cancer Paternal Grandmother    Social History   Tobacco Use  Smoking Status Never  Smokeless Tobacco Never    Social History   Socioeconomic History  Marital status: Divorced  Tobacco Use  Smoking status: Never  Smokeless tobacco: Never  Vaping Use  Vaping status: Never Used  Substance and Sexual Activity  Alcohol use: Not Currently  Drug use: Never   Social Drivers of Health   Financial Resource Strain: Low Risk (09/02/2020)  Received from [facility]  Overall Financial Resource Strain (CARDIA)  Difficulty of Paying Living Expenses: Not hard at all  Food Insecurity: No Food Insecurity (06/01/2023)  Received from [facility]  Hunger Vital Sign  Worried About Running Out of Food in the Last Year: Never true  Ran Out of Food in the Last Year: Never true  Transportation Needs: No Transportation Needs (06/01/2023)  Received from [facility] - Transportation  Lack of Transportation (Medical): No  Lack of Transportation (Non-Medical): No  Physical Activity: Insufficiently Active (06/01/2023)  Received from [facility]  Exercise Vital Sign  Days of Exercise per Week: 3 days  Minutes of Exercise per Session: 30 min  Stress: No Stress Concern Present (06/01/2023)  Received from [facility] - Occupational Stress Questionnaire  Feeling of Stress : Not at all  Social Connections: Moderately Integrated (06/01/2023)  Received from [facility]  Social Connection and Isolation Panel [NHANES]  Frequency of Communication with Friends and Family: More than three times  a week  Frequency of Social Gatherings with Friends and Family: Three times a week  Attends Religious Services: More than 4 times per year  Active Member of Clubs or Organizations: Yes  Attends Banker Meetings: 1 to 4 times per year  Marital Status: Widowed   Objective:    Vitals:  09/28/23 0947  BP: (!) 147/85  Pulse: 64  Temp: 36.7 C (98.1 F)  SpO2: 98%  Weight: 88.8 kg (195 lb 12.8 oz)  Height: 157.5 cm (5\' 2" )  PainSc: 0-No pain   Body mass index is 35.81 kg/m.  Physical Exam   She appears well on exam  There is a 3 cm mass in the upper outer quadrant of her left breast tissue. It is minimally mobile. There are multiple enlarged lymph nodes in her axilla on physical examination. The nipple areolar complex is normal. 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 We have also discussed her in our multidisciplinary breast cancer conference. At that conference we discussed proceeding with neoadjuvant chemotherapy given the amount of involved lymph nodes based on imaging. Apparently this morning medical oncology discussed possibly proceeding with surgery first. I discussed the reasons for neoadjuvant chemotherapy. I also discussed surgery to remove the breast which would be with a lumpectomy and targeted lymph node dissection. Regardless, she would still need a Port-A-Cath for chemotherapy. I discussed Port-A-Cath insertion. We discussed the risks which include but is not limited to bleeding, infection, injury to surrounding structures, injury to the blood vessels, pneumothorax, the need for further procedures, cardiopulmonary issues, etc. Tentatively, they will schedule Port-A-Cath insertion and we will determine over the weekend if that is still the course they would like to proceed with neoadjuvant chemotherapy for surgery upfront on the breast.  Complex medical decision making  Addendum: after a discussion with Dr. Mosetta Putt and again with the family, we have decided to hold a port  placement and proceed instead with a left lumpectomy and targeted left axillary lymph node dissection given the patient's medical issues. I discussed this again with the patient and her daughter who agree with the plans.  She still may require a port eventually.  All the risks of surgery were again discussed

## 2023-10-04 ENCOUNTER — Ambulatory Visit (HOSPITAL_BASED_OUTPATIENT_CLINIC_OR_DEPARTMENT_OTHER)
Admission: RE | Admit: 2023-10-04 | Discharge: 2023-10-04 | Disposition: A | Discharge status: Home / Self Care | Payer: 59 | Attending: Surgery | Admitting: Surgery

## 2023-10-04 ENCOUNTER — Encounter (HOSPITAL_BASED_OUTPATIENT_CLINIC_OR_DEPARTMENT_OTHER)
Admission: RE | Disposition: A | Discharge status: Home / Self Care | Payer: Self-pay | Source: Home / Self Care | Attending: Surgery

## 2023-10-04 ENCOUNTER — Inpatient Hospital Stay
Admission: RE | Admit: 2023-10-04 | Discharge: 2023-10-04 | Discharge status: Home / Self Care | Payer: 59 | Source: Ambulatory Visit | Attending: Surgery | Admitting: Surgery

## 2023-10-04 ENCOUNTER — Ambulatory Visit (HOSPITAL_BASED_OUTPATIENT_CLINIC_OR_DEPARTMENT_OTHER): Payer: 59 | Admitting: Anesthesiology

## 2023-10-04 ENCOUNTER — Other Ambulatory Visit: Payer: Self-pay

## 2023-10-04 ENCOUNTER — Encounter (HOSPITAL_BASED_OUTPATIENT_CLINIC_OR_DEPARTMENT_OTHER): Payer: Self-pay | Admitting: Surgery

## 2023-10-04 DIAGNOSIS — Z79899 Other long term (current) drug therapy: Secondary | ICD-10-CM | POA: Diagnosis not present

## 2023-10-04 DIAGNOSIS — F0390 Unspecified dementia without behavioral disturbance: Secondary | ICD-10-CM | POA: Diagnosis not present

## 2023-10-04 DIAGNOSIS — Z803 Family history of malignant neoplasm of breast: Secondary | ICD-10-CM | POA: Insufficient documentation

## 2023-10-04 DIAGNOSIS — E785 Hyperlipidemia, unspecified: Secondary | ICD-10-CM | POA: Diagnosis not present

## 2023-10-04 DIAGNOSIS — I10 Essential (primary) hypertension: Secondary | ICD-10-CM | POA: Insufficient documentation

## 2023-10-04 DIAGNOSIS — C50912 Malignant neoplasm of unspecified site of left female breast: Secondary | ICD-10-CM | POA: Diagnosis not present

## 2023-10-04 DIAGNOSIS — C50412 Malignant neoplasm of upper-outer quadrant of left female breast: Secondary | ICD-10-CM | POA: Diagnosis not present

## 2023-10-04 DIAGNOSIS — Z1721 Progesterone receptor positive status: Secondary | ICD-10-CM | POA: Diagnosis not present

## 2023-10-04 DIAGNOSIS — Z1732 Human epidermal growth factor receptor 2 negative status: Secondary | ICD-10-CM | POA: Diagnosis not present

## 2023-10-04 DIAGNOSIS — C50919 Malignant neoplasm of unspecified site of unspecified female breast: Secondary | ICD-10-CM

## 2023-10-04 DIAGNOSIS — Z170 Estrogen receptor positive status [ER+]: Secondary | ICD-10-CM | POA: Insufficient documentation

## 2023-10-04 DIAGNOSIS — G8918 Other acute postprocedural pain: Secondary | ICD-10-CM | POA: Diagnosis not present

## 2023-10-04 DIAGNOSIS — C773 Secondary and unspecified malignant neoplasm of axilla and upper limb lymph nodes: Secondary | ICD-10-CM | POA: Diagnosis not present

## 2023-10-04 HISTORY — DX: Malignant neoplasm of unspecified site of unspecified female breast: C50.919

## 2023-10-04 HISTORY — PX: BREAST LUMPECTOMY WITH RADIOACTIVE SEED AND AXILLARY LYMPH NODE DISSECTION: SHX6656

## 2023-10-04 HISTORY — DX: Unspecified asthma, uncomplicated: J45.909

## 2023-10-04 SURGERY — BREAST LUMPECTOMY WITH RADIOACTIVE SEED AND AXILLARY LYMPH NODE DISSECTION
Anesthesia: Regional | Site: Breast | Laterality: Left

## 2023-10-04 MED ORDER — CEFAZOLIN SODIUM-DEXTROSE 2-4 GM/100ML-% IV SOLN
2.0000 g | INTRAVENOUS | Status: AC
  Administered 2023-10-04: 2 g via INTRAVENOUS

## 2023-10-04 MED ORDER — DEXAMETHASONE SODIUM PHOSPHATE 10 MG/ML IJ SOLN
INTRAMUSCULAR | Status: AC
  Filled 2023-10-04: qty 1

## 2023-10-04 MED ORDER — FENTANYL CITRATE (PF) 100 MCG/2ML IJ SOLN
INTRAMUSCULAR | Status: AC
  Filled 2023-10-04: qty 2

## 2023-10-04 MED ORDER — FENTANYL CITRATE (PF) 100 MCG/2ML IJ SOLN
25.0000 ug | INTRAMUSCULAR | Status: DC | PRN
  Administered 2023-10-04 (×4): 25 ug via INTRAVENOUS

## 2023-10-04 MED ORDER — ACETAMINOPHEN 500 MG PO TABS
1000.0000 mg | ORAL_TABLET | ORAL | Status: AC
Start: 2023-10-04 — End: 2023-10-04
  Administered 2023-10-04: 1000 mg via ORAL

## 2023-10-04 MED ORDER — MIDAZOLAM HCL 2 MG/2ML IJ SOLN
INTRAMUSCULAR | Status: AC
  Filled 2023-10-04: qty 2

## 2023-10-04 MED ORDER — KETOROLAC TROMETHAMINE 15 MG/ML IJ SOLN
INTRAMUSCULAR | Status: AC
  Filled 2023-10-04: qty 1

## 2023-10-04 MED ORDER — LIDOCAINE 2% (20 MG/ML) 5 ML SYRINGE
INTRAMUSCULAR | Status: AC
  Filled 2023-10-04: qty 5

## 2023-10-04 MED ORDER — TRAMADOL HCL 50 MG PO TABS: 50.0000 mg | ORAL_TABLET | Freq: Four times a day (QID) | ORAL | 0 refills | Status: DC | PRN

## 2023-10-04 MED ORDER — LACTATED RINGERS IV SOLN: INTRAVENOUS | Status: DC

## 2023-10-04 MED ORDER — ONDANSETRON HCL 4 MG/2ML IJ SOLN
INTRAMUSCULAR | Status: AC
  Filled 2023-10-04: qty 2

## 2023-10-04 MED ORDER — OXYCODONE HCL 5 MG PO TABS
ORAL_TABLET | ORAL | Status: AC
  Filled 2023-10-04: qty 1

## 2023-10-04 MED ORDER — ONDANSETRON HCL 4 MG/2ML IJ SOLN
INTRAMUSCULAR | Status: AC
Start: 2023-10-04 — End: ?
  Filled 2023-10-04: qty 2

## 2023-10-04 MED ORDER — MIDAZOLAM HCL 2 MG/2ML IJ SOLN
2.0000 mg | Freq: Once | INTRAMUSCULAR | Status: AC
  Administered 2023-10-04: 2 mg via INTRAVENOUS

## 2023-10-04 MED ORDER — 0.9 % SODIUM CHLORIDE (POUR BTL) OPTIME
TOPICAL | Status: DC | PRN
  Administered 2023-10-04: 400 mL

## 2023-10-04 MED ORDER — EPHEDRINE 5 MG/ML INJ
INTRAVENOUS | Status: AC
  Filled 2023-10-04: qty 5

## 2023-10-04 MED ORDER — ACETAMINOPHEN 500 MG PO TABS
ORAL_TABLET | ORAL | Status: AC
Start: 2023-10-04 — End: ?
  Filled 2023-10-04: qty 2

## 2023-10-04 MED ORDER — BUPIVACAINE-EPINEPHRINE (PF) 0.5% -1:200000 IJ SOLN
INTRAMUSCULAR | Status: AC
  Filled 2023-10-04: qty 30

## 2023-10-04 MED ORDER — ROPIVACAINE HCL 5 MG/ML IJ SOLN
INTRAMUSCULAR | Status: DC | PRN
  Administered 2023-10-04: 30 mL via PERINEURAL

## 2023-10-04 MED ORDER — DEXAMETHASONE SODIUM PHOSPHATE 4 MG/ML IJ SOLN
INTRAMUSCULAR | Status: DC | PRN
  Administered 2023-10-04: 5 mg via INTRAVENOUS

## 2023-10-04 MED ORDER — ONDANSETRON HCL 4 MG/2ML IJ SOLN
4.0000 mg | Freq: Once | INTRAMUSCULAR | Status: AC
  Administered 2023-10-04: 4 mg via INTRAVENOUS

## 2023-10-04 MED ORDER — FENTANYL CITRATE (PF) 100 MCG/2ML IJ SOLN: 100.0000 ug | Freq: Once | INTRAMUSCULAR | Status: DC

## 2023-10-04 MED ORDER — KETOROLAC TROMETHAMINE 15 MG/ML IJ SOLN
15.0000 mg | Freq: Once | INTRAMUSCULAR | Status: AC | PRN
  Administered 2023-10-04: 15 mg via INTRAVENOUS

## 2023-10-04 MED ORDER — SUCCINYLCHOLINE CHLORIDE 200 MG/10ML IV SOSY
PREFILLED_SYRINGE | INTRAVENOUS | Status: AC
Start: 2023-10-04 — End: ?
  Filled 2023-10-04: qty 10

## 2023-10-04 MED ORDER — PHENYLEPHRINE 80 MCG/ML (10ML) SYRINGE FOR IV PUSH (FOR BLOOD PRESSURE SUPPORT)
PREFILLED_SYRINGE | INTRAVENOUS | Status: AC
Start: 2023-10-04 — End: ?
  Filled 2023-10-04: qty 10

## 2023-10-04 MED ORDER — EPHEDRINE SULFATE (PRESSORS) 50 MG/ML IJ SOLN
INTRAMUSCULAR | Status: DC | PRN
  Administered 2023-10-04: 10 mg via INTRAVENOUS

## 2023-10-04 MED ORDER — ONDANSETRON HCL 4 MG/2ML IJ SOLN: 4.0000 mg | Freq: Once | INTRAMUSCULAR | Status: DC | PRN

## 2023-10-04 MED ORDER — LIDOCAINE HCL (CARDIAC) PF 100 MG/5ML IV SOSY
PREFILLED_SYRINGE | INTRAVENOUS | Status: DC | PRN
  Administered 2023-10-04: 40 mg via INTRAVENOUS

## 2023-10-04 MED ORDER — ATROPINE SULFATE 0.4 MG/ML IV SOLN
INTRAVENOUS | Status: AC
  Filled 2023-10-04: qty 1

## 2023-10-04 MED ORDER — BUPIVACAINE-EPINEPHRINE 0.5% -1:200000 IJ SOLN
INTRAMUSCULAR | Status: DC | PRN
  Administered 2023-10-04: 20 mL

## 2023-10-04 MED ORDER — PROPOFOL 10 MG/ML IV BOLUS
INTRAVENOUS | Status: DC | PRN
  Administered 2023-10-04: 150 mg via INTRAVENOUS

## 2023-10-04 MED ORDER — CEFAZOLIN SODIUM-DEXTROSE 2-4 GM/100ML-% IV SOLN
INTRAVENOUS | Status: AC
  Filled 2023-10-04: qty 100

## 2023-10-04 MED ORDER — FENTANYL CITRATE (PF) 100 MCG/2ML IJ SOLN
INTRAMUSCULAR | Status: DC | PRN
  Administered 2023-10-04 (×2): 25 ug via INTRAVENOUS

## 2023-10-04 MED ORDER — AMISULPRIDE (ANTIEMETIC) 5 MG/2ML IV SOLN: 10.0000 mg | Freq: Once | INTRAVENOUS | Status: DC | PRN

## 2023-10-04 MED ORDER — OXYCODONE HCL 5 MG PO TABS
5.0000 mg | ORAL_TABLET | Freq: Once | ORAL | Status: AC
  Administered 2023-10-04: 5 mg via ORAL

## 2023-10-04 SURGICAL SUPPLY — 38 items
APPLIER CLIP 9.375 MED OPEN (MISCELLANEOUS) ×1
BINDER BREAST XXLRG (GAUZE/BANDAGES/DRESSINGS) IMPLANT
BLADE SURG 15 STRL LF DISP TIS (BLADE) ×2 IMPLANT
CANISTER SUCT 1200ML W/VALVE (MISCELLANEOUS) IMPLANT
CHLORAPREP W/TINT 26 (MISCELLANEOUS) ×2 IMPLANT
CLIP APPLIE 9.375 MED OPEN (MISCELLANEOUS) ×2 IMPLANT
CLIP TI WIDE RED SMALL 6 (CLIP) IMPLANT
COVER BACK TABLE 60X90IN (DRAPES) ×2 IMPLANT
COVER MAYO STAND STRL (DRAPES) ×2 IMPLANT
COVER PROBE CYLINDRICAL 5X96 (MISCELLANEOUS) ×2 IMPLANT
DERMABOND ADVANCED .7 DNX12 (GAUZE/BANDAGES/DRESSINGS) ×2 IMPLANT
DRAPE LAPAROSCOPIC ABDOMINAL (DRAPES) ×2 IMPLANT
DRAPE UTILITY XL STRL (DRAPES) ×2 IMPLANT
ELECT REM PT RETURN 9FT ADLT (ELECTROSURGICAL) ×1
ELECTRODE REM PT RTRN 9FT ADLT (ELECTROSURGICAL) ×2 IMPLANT
GAUZE SPONGE 4X4 12PLY STRL LF (GAUZE/BANDAGES/DRESSINGS) IMPLANT
GLOVE SURG SIGNA 7.5 PF LTX (GLOVE) ×2 IMPLANT
GOWN STRL REUS W/ TWL LRG LVL3 (GOWN DISPOSABLE) ×2 IMPLANT
GOWN STRL REUS W/ TWL XL LVL3 (GOWN DISPOSABLE) ×2 IMPLANT
KIT MARKER MARGIN INK (KITS) ×2 IMPLANT
NDL HYPO 25X1 1.5 SAFETY (NEEDLE) ×2 IMPLANT
NDL SAFETY ECLIPSE 18X1.5 (NEEDLE) ×2 IMPLANT
NEEDLE HYPO 25X1 1.5 SAFETY (NEEDLE) ×1
NS IRRIG 1000ML POUR BTL (IV SOLUTION) ×2 IMPLANT
PACK BASIN DAY SURGERY FS (CUSTOM PROCEDURE TRAY) ×2 IMPLANT
PENCIL SMOKE EVACUATOR (MISCELLANEOUS) ×2 IMPLANT
SLEEVE SCD COMPRESS KNEE MED (STOCKING) ×2 IMPLANT
SPIKE FLUID TRANSFER (MISCELLANEOUS) IMPLANT
SPONGE T-LAP 4X18 ~~LOC~~+RFID (SPONGE) ×2 IMPLANT
SUT MNCRL AB 4-0 PS2 18 (SUTURE) ×2 IMPLANT
SUT SILK 2 0 SH (SUTURE) IMPLANT
SUT VIC AB 3-0 SH 27X BRD (SUTURE) ×2 IMPLANT
SYR CONTROL 10ML LL (SYRINGE) ×2 IMPLANT
TOWEL GREEN STERILE FF (TOWEL DISPOSABLE) ×2 IMPLANT
TRACER MAGTRACE VIAL (MISCELLANEOUS) IMPLANT
TRAY FAXITRON CT DISP (TRAY / TRAY PROCEDURE) ×2 IMPLANT
TUBE CONNECTING 20X1/4 (TUBING) IMPLANT
YANKAUER SUCT BULB TIP NO VENT (SUCTIONS) IMPLANT

## 2023-10-04 NOTE — Transfer of Care (Signed)
Immediate Anesthesia Transfer of Care Note  Patient: [name]  Procedure(s) Performed: LEFT BREAST LUMPECTOMY AND TARGETED LYMPH NODE DISSECTION (Left: Breast)  Patient Location: PACU  Anesthesia Type:General  Level of Consciousness: sedated  Airway & Oxygen Therapy: Patient Spontanous Breathing and Patient connected to face mask oxygen  Post-op Assessment: Report given to RN and Post -op Vital signs reviewed and stable  Post vital signs: Reviewed and stable  Last Vitals:  Vitals Value Taken Time  BP 129/73 10/04/23 1111  Temp    Pulse 85 10/04/23 1113  Resp 14 10/04/23 1113  SpO2 99 % 10/04/23 1113  Vitals shown include unfiled device data.  Last Pain:  Vitals:   10/04/23 0915  TempSrc: Oral  PainSc: 0-No pain      Patients Stated Pain Goal: 3 (10/04/23 0915)  Complications: No notable events documented.

## 2023-10-04 NOTE — Interval H&P Note (Signed)
History and Physical Interval Note:no change in H and P  10/04/2023 9:30 AM  [name]  has presented today for surgery, with the diagnosis of LEFT BREAST CANCER--NEED FOR CHEMOTHERAPY.  The various methods of treatment have been discussed with the patient and family. After consideration of risks, benefits and other options for treatment, the patient has consented to  Procedure(s): LEFT BREAST LUMPECTOMY AND TARGETED LYMPH NODE DISSECTION (Left) as a surgical intervention.  The patient's history has been reviewed, patient examined, no change in status, stable for surgery.  I have reviewed the patient's chart and labs.  Questions were answered to the patient's satisfaction.     Abigail Miyamoto

## 2023-10-04 NOTE — Discharge Instructions (Addendum)
[facility] Office Phone Number [phone]  BREAST BIOPSY/ PARTIAL MASTECTOMY: POST OP INSTRUCTIONS  Always review your discharge instruction sheet given to you by the facility where your surgery was performed.  IF YOU HAVE DISABILITY OR FAMILY LEAVE FORMS, YOU MUST BRING THEM TO THE OFFICE FOR PROCESSING.  DO NOT GIVE THEM TO YOUR DOCTOR.  A prescription for pain medication may be given to you upon discharge.  Take your pain medication as prescribed, if needed.  If narcotic pain medicine is not needed, then you may take acetaminophen (Tylenol) or ibuprofen (Advil) as needed. Take your usually prescribed medications unless otherwise directed If you need a refill on your pain medication, please contact your pharmacy.  They will contact our office to request authorization.  Prescriptions will not be filled after 5pm or on week-ends. You should eat very light the first 24 hours after surgery, such as soup, crackers, pudding, etc.  Resume your normal diet the day after surgery. Most patients will experience some swelling and bruising in the breast.  Ice packs and a good support bra will help.  Swelling and bruising can take several days to resolve.  It is common to experience some constipation if taking pain medication after surgery.  Increasing fluid intake and taking a stool softener will usually help or prevent this problem from occurring.  A mild laxative (Milk of Magnesia or Miralax) should be taken according to package directions if there are no bowel movements after 48 hours. Unless discharge instructions indicate otherwise, you may remove your bandages 24-48 hours after surgery, and you may shower at that time.  You may have steri-strips (small skin tapes) in place directly over the incision.  These strips should be left on the skin for 7-10 days.  If your surgeon used skin glue on the incision, you may shower in 24 hours.  The glue will flake off over the next 2-3 weeks.  Any  sutures or staples will be removed at the office during your follow-up visit. ACTIVITIES:  You may resume regular daily activities (gradually increasing) beginning the next day.  Wearing a good support bra or sports bra minimizes pain and swelling.  You may have sexual intercourse when it is comfortable. You may drive when you no longer are taking prescription pain medication, you can comfortably wear a seatbelt, and you can safely maneuver your car and apply brakes. RETURN TO WORK:  ______________________________________________________________________________________ [name] should see your doctor in the office for a follow-up appointment approximately two weeks after your surgery.  Your doctor's nurse will typically make your follow-up appointment when she calls you with your pathology report.  Expect your pathology report 2-3 business days after your surgery.  You may call to check if you do not hear from [place] after three days. OTHER INSTRUCTIONS: YOU MAY REMOVE THE BINDER AND SHOWER STARTING TOMORROW AND THEN PUT THE BINDER BACK ON. AFTER SATURDAY, WEAR A SPORTS BRA FOR 1 TO 2 WEEKS ICE PACK AND TYLENOL ALSO FOR PAIN NO VIGOROUS ACTIVITY FOR ONE WEEK _____________________________________________________________________________________________________________________________________ _____________________________________________________________________________________________________________________________________ _____________________________________________________________________________________________________________________________________  WHEN TO CALL YOUR DOCTOR: Fever over 101.0 Nausea and/or vomiting. Extreme swelling or bruising. Continued bleeding from incision. Increased pain, redness, or drainage from the incision.  The clinic staff is available to answer your questions during regular business hours.  Please don't hesitate to call and ask to speak to one of the nurses for clinical  concerns.  If you have a medical emergency, go to the nearest emergency room or call 911.  A surgeon from [facility] is always on call at the hospital.  For further questions, please visit [email or web address]    Post Anesthesia Home Care Instructions  Activity: Get plenty of rest for the remainder of the day. A responsible individual must stay with you for 24 hours following the procedure.  For the next 24 hours, DO NOT: -Drive a car -Advertising copywriter -Drink alcoholic beverages -Take any medication unless instructed by your physician -Make any legal decisions or sign important papers.  Meals: Start with liquid foods such as gelatin or soup. Progress to regular foods as tolerated. Avoid greasy, spicy, heavy foods. If nausea and/or vomiting occur, drink only clear liquids until the nausea and/or vomiting subsides. Call your physician if vomiting continues.  Special Instructions/Symptoms: Your throat may feel dry or sore from the anesthesia or the breathing tube placed in your throat during surgery. If this causes discomfort, gargle with warm salt water. The discomfort should disappear within 24 hours.  If you had a scopolamine patch placed behind your ear for the management of post- operative nausea and/or vomiting:  1. The medication in the patch is effective for 72 hours, after which it should be removed.  Wrap patch in a tissue and discard in the trash. Wash hands thoroughly with soap and water. 2. You may remove the patch earlier than 72 hours if you experience unpleasant side effects which may include dry mouth, dizziness or visual disturbances. 3. Avoid touching the patch. Wash your hands with soap and water after contact with the patch.     Last received tylenol at 0920am

## 2023-10-04 NOTE — Progress Notes (Signed)
Assisted Dr. Ellender with left, pectoralis, ultrasound guided block. Side rails up, monitors on throughout procedure. See vital signs in flow sheet. Tolerated Procedure well. 

## 2023-10-04 NOTE — Anesthesia Postprocedure Evaluation (Signed)
Anesthesia Post Note  Patient: [name]  Procedure(s) Performed: LEFT BREAST LUMPECTOMY AND TARGETED LYMPH NODE DISSECTION (Left: Breast)     Patient location during evaluation: PACU Anesthesia Type: Regional and General Level of consciousness: awake Pain management: pain level controlled Vital Signs Assessment: post-procedure vital signs reviewed and stable Respiratory status: spontaneous breathing, nonlabored ventilation and respiratory function stable Cardiovascular status: blood pressure returned to baseline and stable Postop Assessment: no apparent nausea or vomiting Anesthetic complications: no   No notable events documented.  Last Vitals:  Vitals:   10/04/23 1210 10/04/23 1228  BP:  (!) 155/87  Pulse: 75 72  Resp: 19 16  Temp:  36.8 C  SpO2: 100% 98%    Last Pain:  Vitals:   10/04/23 1228  TempSrc:   PainSc: 5                  [name] Anoop Hemmer

## 2023-10-04 NOTE — Anesthesia Preprocedure Evaluation (Addendum)
Anesthesia Evaluation  Patient identified by MRN, date of birth, ID band Patient awake    Reviewed: Allergy & Precautions, NPO status , Patient's Chart, lab work & pertinent test results  Airway Mallampati: II  TM Distance: >3 FB Neck ROM: Full    Dental  (+) Partial Upper   Pulmonary asthma    Pulmonary exam normal        Cardiovascular hypertension, Pt. on medications and Pt. on home beta blockers Normal cardiovascular exam     Neuro/Psych  PSYCHIATRIC DISORDERS Anxiety Depression     Neuromuscular disease    GI/Hepatic negative GI ROS, Neg liver ROS,,,  Endo/Other  negative endocrine ROS    Renal/GU negative Renal ROS     Musculoskeletal  (+) Arthritis ,    Abdominal  (+) + obese  Peds  Hematology negative hematology ROS (+)   Anesthesia Other Findings LEFT BREAST CANCER  NEED FOR CHEMOTHERAPY    Reproductive/Obstetrics                             Anesthesia Physical Anesthesia Plan  ASA: 3  Anesthesia Plan: General and Regional   Post-op Pain Management: Regional block*   Induction: Intravenous  PONV Risk Score and Plan: 3 and Ondansetron, Dexamethasone and Midazolam  Airway Management Planned:   Additional Equipment:   Intra-op Plan:   Post-operative Plan: Extubation in OR  Informed Consent: I have reviewed the patients History and Physical, chart, labs and discussed the procedure including the risks, benefits and alternatives for the proposed anesthesia with the patient or authorized representative who has indicated his/her understanding and acceptance.     Dental advisory given  Plan Discussed with: CRNA  Anesthesia Plan Comments:        Anesthesia Quick Evaluation

## 2023-10-04 NOTE — Anesthesia Procedure Notes (Signed)
Procedure Name: LMA Insertion Date/Time: 10/04/2023 9:58 AM  Performed by: Ronnette Hila, CRNAPre-anesthesia Checklist: Patient identified, Emergency Drugs available, Suction available and Patient being monitored Patient Re-evaluated:Patient Re-evaluated prior to induction Oxygen Delivery Method: Circle System Utilized Preoxygenation: Pre-oxygenation with 100% oxygen Induction Type: IV induction Ventilation: Mask ventilation without difficulty LMA: LMA inserted LMA Size: 4.0 Number of attempts: 1 Airway Equipment and Method: bite block Placement Confirmation: positive ETCO2 Tube secured with: Tape Dental Injury: Teeth and Oropharynx as per pre-operative assessment

## 2023-10-04 NOTE — Anesthesia Procedure Notes (Signed)
Anesthesia Regional Block: Pectoralis block   Pre-Anesthetic Checklist: , timeout performed,  Correct Patient, Correct Site, Correct Laterality,  Correct Procedure, Correct Position, site marked,  Risks and benefits discussed,  Surgical consent,  Pre-op evaluation,  At surgeon's request and post-op pain management  Laterality: Left  Prep: chloraprep       Needles:  Injection technique: Single-shot  Needle Type: Echogenic Stimulator Needle     Needle Length: 10cm  Needle Gauge: 20     Additional Needles:   Procedures:,,,, ultrasound used (permanent image in chart),,    Narrative:  Start time: 10/04/2023 9:30 AM End time: 10/04/2023 9:40 AM Injection made incrementally with aspirations every 5 mL.  Performed by: Personally  Anesthesiologist: Leonides Grills, MD  Additional Notes: Functioning IV was confirmed and monitors were applied.  A timeout was performed. Sterile prep, hand hygiene and sterile gloves were used. A 20ga Bbraun echogenic stimulator needle was used. Negative aspiration and negative test dose prior to incremental administration of local anesthetic. The patient tolerated the procedure well.  Ultrasound guidance: relevent anatomy identified, needle position confirmed, local anesthetic spread visualized around nerve(s), vascular puncture avoided.  Image printed for medical record.

## 2023-10-04 NOTE — Op Note (Signed)
[name] 10/04/2023   Pre-op Diagnosis: LEFT BREAST CANCER     Post-op Diagnosis: SAME  Procedure(s): LEFT BREAST RADIOACTIVE SEED GUIDED LUMPECTOMY LEFT DEEP AXILLARY TARGETED LYMPH NODE DISSECTION  Surgeon(s): Abigail Miyamoto, MD  Anesthesia: General  Staff:  Circulator: Raliegh Scarlet, RN Scrub Person: Maryan Rued, RN  Estimated Blood Loss: Minimal               Specimens: sent to path  Indications: This is a 67 year old female recently diagnosed with a left breast cancer.  Biopsy of a mass in the left breast as well as an enlarged lymph node were positive for breast cancer.  We discussed neoadjuvant chemotherapy versus upfront surgery.  Given her history of developing dementia, the decision made to proceed with upfront surgery in hopes of avoiding chemotherapy  Findings: The patient is found of a large mass in the upper outer quadrant of the left breast.  I excised a completely with the posterior margin being the chest wall and the anterior margin being skin She also had multiple matted enlarged lymph nodes in the left axilla which were excised  Procedure: The patient was brought to the operating identifies the correct patient.  She was placed upon the operating table general anesthesia was induced.  Her left breast and axilla were then prepped and draped in usual sterile fashion.  She had a palpable left breast mass in the upper outer quadrant.  With the neoprobe I located the radioactive seed at this area.  I anesthetized skin with Marcaine over the palpable mass and then performed an elliptical incision with a scalpel.  I then dissected circumferentially down to the breast tissue with electrocautery.  I stayed widely around the signal from the reactive seed and the palpable mass going all the way down to the chest wall removing the mass from the pectoralis.  It was not invading into the pectoralis.  I then completed lumpectomy staying widely around the mass.   I marked all margins with paint.  An x-ray was performed and specimen confirming that the radioactive seed and biopsy clip were in the specimen. Through the same incision I was then able to dissect into the axilla.  I dissected down to the deep axilla.  The radioactive seed and the lymph node was more superficial but the deep lymph nodes were all quite enlarged and matted.  I dissected out the hard fixed lymph nodes using surgical clips and the electrocautery.  I then completed the dissection removing multiple enlarged lymph nodes as well as the radioactive seed.  An x-ray was performed confirming that the radioactive seed within the specimen.  It was difficult to tell whether the [facility] clip was in the specimen.  I had already moved a significant amount of lymph nodes and so decided not to remove any further lymph nodes as she may require a complete formal axillary lymph node dissection.  At this point hemostasis was achieved with the cautery and surgical clips in the axilla.  I then reapproximated some of the deep tissue in the axilla with 3-0 Vicryl sutures.  I placed surgical clips around the periphery of the the previous lumpectomy site.  I then closed the incision with interrupted 3-0 Vicryl sutures and a running 4-0 Monocryl suture.  Dermabond was then applied.  The patient tolerated the procedure well.  All the counts were correct at the end of the procedure.  The patient was then placed in a breast binder.  She was  then extubated in the operating room and taken in stable condition to the recovery room.          Abigail Miyamoto   Date: 10/04/2023  Time: 11:03 AM

## 2023-10-04 NOTE — Progress Notes (Unsigned)
    SUBJECTIVE:   CHIEF COMPLAINT / HPI:   Breast cancer Newly diagnosed. Has been seen by onc and surgery since our last visit and it opting first for surgery and may consider chemo in future id needed.   HTN Currently taking amlodipine 10 mg, losartan 25 mg, metoprolol 50 mg BID  Daughter starting new job in [place]   PERTINENT  PMH / PSH: ***  OBJECTIVE:   BP (!) 158/84   Pulse 62   Ht 5\' 2"  (1.575 m)   Wt 196 lb 9.6 oz (89.2 kg)   SpO2 100%   BMI 35.96 kg/m    General: NAD, pleasant, able to participate in exam Cardiac: RRR, no murmurs. Respiratory: CTAB, normal effort, No wheezes, rales or rhonchi Abdomen: Bowel sounds present, nontender, nondistended, no hepatosplenomegaly. Extremities: no edema or cyanosis. Skin: warm and dry, no rashes noted Neuro: alert, no obvious focal deficits Psych: Normal affect and mood  ASSESSMENT/PLAN:   No problem-specific Assessment & Plan notes found for this encounter.     Dr. Erick Alley, DO [facility]    {    This will disappear when note is signed, click to select method of visit    :1}

## 2023-10-05 ENCOUNTER — Ambulatory Visit (INDEPENDENT_AMBULATORY_CARE_PROVIDER_SITE_OTHER): Payer: 59 | Admitting: Student

## 2023-10-05 ENCOUNTER — Encounter (HOSPITAL_BASED_OUTPATIENT_CLINIC_OR_DEPARTMENT_OTHER): Payer: Self-pay | Admitting: Surgery

## 2023-10-05 VITALS — BP 159/83 | HR 64 | Ht 62.0 in | Wt 196.6 lb

## 2023-10-05 DIAGNOSIS — C50412 Malignant neoplasm of upper-outer quadrant of left female breast: Secondary | ICD-10-CM

## 2023-10-05 DIAGNOSIS — I10 Essential (primary) hypertension: Secondary | ICD-10-CM | POA: Diagnosis not present

## 2023-10-05 DIAGNOSIS — R413 Other amnesia: Secondary | ICD-10-CM

## 2023-10-05 DIAGNOSIS — Z170 Estrogen receptor positive status [ER+]: Secondary | ICD-10-CM | POA: Diagnosis not present

## 2023-10-05 MED ORDER — LOSARTAN POTASSIUM 50 MG PO TABS: 50.0000 mg | ORAL_TABLET | Freq: Every day | ORAL | 3 refills | Status: DC

## 2023-10-05 NOTE — Patient Instructions (Signed)
It was great to see you! Thank you for allowing me to participate in your care!  I recommend that you always bring your medications to each appointment as this makes it easy to ensure you are on the correct medications and helps [place] not miss when refills are needed.  Our plans for today:  - Losartan increased to 50 mg (stop the 25 mg), sent to pharmacy  - I will look into resources for help at home and get back to you -Return in 2 weeks for blood pressure check    Take care and seek immediate care sooner if you develop any concerns.   Dr. Erick Alley, DO [facility]

## 2023-10-06 NOTE — Assessment & Plan Note (Signed)
Following with oncology and surgery.  S/p lumpectomy and awaiting pathology before deciding on chemo.

## 2023-10-06 NOTE — Assessment & Plan Note (Signed)
Uncontrolled. -Increase losartan to 50 mg daily -Continue metoprolol 50 mg twice daily and amlodipine 10 mg daily -Return in about 2 weeks for BP check and BMP -Check BP daily at home and bring readings to next visit

## 2023-10-06 NOTE — Assessment & Plan Note (Signed)
With daughter taking new job, she and patient are concerned about patient's ability to consistently take her medications and eat regularly.  Unfortunately, with patient only having Medicare, insurance will likely not cover health aide for ADLs.  I will place order for home health aide so patient can be evaluated, daughter can look into finances to see what they can afford.

## 2023-10-08 ENCOUNTER — Encounter: Payer: Self-pay | Admitting: *Deleted

## 2023-10-09 LAB — SURGICAL PATHOLOGY

## 2023-10-12 ENCOUNTER — Encounter: Payer: Self-pay | Admitting: *Deleted

## 2023-10-19 ENCOUNTER — Inpatient Hospital Stay: Payer: 59 | Attending: Hematology | Admitting: Hematology

## 2023-10-19 VITALS — BP 137/67 | HR 63 | Temp 97.3°F | Resp 15 | Wt 193.5 lb

## 2023-10-19 DIAGNOSIS — C50412 Malignant neoplasm of upper-outer quadrant of left female breast: Secondary | ICD-10-CM | POA: Insufficient documentation

## 2023-10-19 DIAGNOSIS — C773 Secondary and unspecified malignant neoplasm of axilla and upper limb lymph nodes: Secondary | ICD-10-CM | POA: Diagnosis not present

## 2023-10-19 DIAGNOSIS — M8580 Other specified disorders of bone density and structure, unspecified site: Secondary | ICD-10-CM | POA: Insufficient documentation

## 2023-10-19 DIAGNOSIS — F0394 Unspecified dementia, unspecified severity, with anxiety: Secondary | ICD-10-CM | POA: Diagnosis not present

## 2023-10-19 DIAGNOSIS — Z1732 Human epidermal growth factor receptor 2 negative status: Secondary | ICD-10-CM | POA: Diagnosis not present

## 2023-10-19 DIAGNOSIS — Z1721 Progesterone receptor positive status: Secondary | ICD-10-CM | POA: Insufficient documentation

## 2023-10-19 DIAGNOSIS — I10 Essential (primary) hypertension: Secondary | ICD-10-CM | POA: Insufficient documentation

## 2023-10-19 DIAGNOSIS — Z79899 Other long term (current) drug therapy: Secondary | ICD-10-CM | POA: Insufficient documentation

## 2023-10-19 DIAGNOSIS — Z170 Estrogen receptor positive status [ER+]: Secondary | ICD-10-CM | POA: Diagnosis not present

## 2023-10-19 DIAGNOSIS — Z5189 Encounter for other specified aftercare: Secondary | ICD-10-CM | POA: Insufficient documentation

## 2023-10-19 DIAGNOSIS — Z5111 Encounter for antineoplastic chemotherapy: Secondary | ICD-10-CM | POA: Insufficient documentation

## 2023-10-19 DIAGNOSIS — R110 Nausea: Secondary | ICD-10-CM | POA: Insufficient documentation

## 2023-10-19 NOTE — Assessment & Plan Note (Signed)
-  pT3N3aMx, at least stage IIIA, ER+/PR+/HER2- -patient presented with a palpable left breast mass, ultrasound showed multiple enlarged left axillary lymph node. -Due to her underlying dementia, she underwent upfront surgery.  Unfortunately surgical path showed or +14 lymph nodes, with negative margins. -Recommend CT and bone scan to rule out distant metastasis -I discussed the high risk of recurrence due to large number of positive lymph node, and the benefit of adjuvant chemotherapy, followed by adjuvant radiation, aromatase inhibitor and CDK4/6 inhibitor  --Chemotherapy consent: Side effects including but does not not limited to, fatigue, nausea, vomiting, diarrhea, hair loss, neuropathy, fluid retention, renal and kidney dysfunction, neutropenic fever, needed for blood transfusion, bleeding, cognitive deterioration from chemotherapy, were discussed with patient in great detail. She agrees to proceed. -If her restaging CT scan or bone scan shows distant metastasis, then I recommend first-line aromatase inhibitor and CDK 4/6 inhibitor

## 2023-10-19 NOTE — Progress Notes (Signed)
 [facility]   Telephone:([phone] [phone]   Clinic Follow up Note   Patient Care Team: [name], DO as PCP - General (Family Medicine) [name], MD as PCP - Cardiology (Cardiology) [name], MD (Inactive) as Consulting Physician (Neurology)  Date of Service:  10/19/2023  CHIEF COMPLAINT: f/u of breast cancer  CURRENT THERAPY:  Pending adjuvant therapy  Oncology History   Malignant neoplasm of upper-outer quadrant of left breast in female, estrogen receptor positive (HCC) -pT3N3aMx, at least stage IIIA, ER+/PR+/HER2- -patient presented with a palpable left breast mass, ultrasound showed multiple enlarged left axillary lymph node. -Due to her underlying dementia, she underwent upfront surgery.  Unfortunately surgical path showed or +14 lymph nodes, with negative margins. -Recommend CT and bone scan to rule out distant metastasis -I discussed the high risk of recurrence due to large number of positive lymph node, and the benefit of adjuvant chemotherapy, followed by adjuvant radiation, aromatase inhibitor and CDK4/6 inhibitor  --Chemotherapy consent: Side effects including but does not not limited to, fatigue, nausea, vomiting, diarrhea, hair loss, neuropathy, fluid retention, renal and kidney dysfunction, neutropenic fever, needed for blood transfusion, bleeding, cognitive deterioration from chemotherapy, were discussed with patient in great detail. She agrees to proceed. -If her restaging CT scan or bone scan shows distant metastasis, then I recommend first-line aromatase inhibitor and CDK 4/6 inhibitor    Assessment and Plan    Breast Cancer (Stage III/IV) Post-surgical follow-up after removal of a 4.6 cm tumor with 14 positive lymph nodes, indicating at least Stage III, will rule out Stage IV disease. Asymptomatic with well-healing incision. Discussed treatment options including chemotherapy, radiation, and anti-estrogen therapy.  Cognitive side effects of chemotherapy, such as memory loss and confusion, were reviewed. The patient is considering chemotherapy after family discussion. Without chemotherapy, recurrence risk is 40-50%. If recurrence occurs, it is treatable with anti-estrogen therapy. Alternative chemotherapy regimens discussed: AC every two weeks for four cycles followed by Taxol  weekly for twelve weeks, or TC every three weeks for four to six cycles. Anti-estrogen therapy and additional oral CDK4/6 inhibitor recommended to reduce recurrence risk. - Order whole-body CT and bone scan or PET scan - Schedule follow-up visit in two weeks to review scan results - Ensure hydration and check kidney function before the scan - Discuss potential need for a port for chemotherapy administration  Dementia  Short-term memory loss, possibly exacerbated by chemotherapy. The patient is aware of the risks and will discuss with family before deciding on chemotherapy. Long-term memory remains intact. - Monitor cognitive function - Discuss potential cognitive side effects of chemotherapy with family  Nausea Intermittent nausea, likely secondary to medications (Aricept and Zoloft ). Symptoms have improved but are still present occasionally. - Monitor nausea and adjust medications if necessary  Plan -Surgical pathology reviewed, we discussed the very high risk of recurrence -I ordered CT chest, abdomen pelvis with contrast and bone scan for staging, to rule out distant metastasis - Schedule follow-up visit in two weeks - Ensure whole-body scan is completed before the follow-up visit -pt and her children will think about chemo        SUMMARY OF ONCOLOGIC HISTORY: Oncology History  Malignant neoplasm of upper-outer quadrant of left breast in female, estrogen receptor positive (HCC)  09/28/2023 Initial Diagnosis   Malignant neoplasm of upper-outer quadrant of left breast in female, estrogen receptor positive (HCC)   09/28/2023  Cancer Staging   Staging form: Breast, AJCC 8th Edition - Clinical stage from  09/28/2023: Stage IIA (cT2, cN1, cM0, G2, ER+, PR+, HER2-) - Signed by [name], MD on 09/28/2023 Histologic grading system: 3 grade system   10/04/2023 Cancer Staging   Staging form: Breast, AJCC 8th Edition - Pathologic stage from 10/04/2023: Stage IIIA (pT2, pN3a, cM0, G2, ER+, PR+, HER2-) - Signed by [name], MD on 10/19/2023 Histologic grading system: 3 grade system Residual tumor (R): R0 - None      Discussed the use of AI scribe software for clinical note transcription with the patient, who gave verbal consent to proceed.  History of Present Illness   The patient, a 68 year old female with a recent diagnosis of breast cancer, presents for a follow-up visit after surgery. The patient underwent surgery a few weeks ago, which she reports went well. She denies any current pain or discomfort and reports that the incision is healing well. The patient was surprised to learn that all fourteen lymph nodes tested during surgery were positive. The tumor was moderately sized at 4.6 cm. The patient is scheduled to see her surgeon, Dr. Vernetta, later in the day.  The patient has been experiencing some cognitive issues, including memory loss, which she and her family are concerned may worsen with chemotherapy. The patient is willing to try chemotherapy but wants to discuss it with her family first. She understands the risks associated with chemotherapy, including potential cognitive effects, but is also aware of the high risk of cancer recurrence if she does not undergo chemotherapy.  The patient's family member reports that the patient has been experiencing some nausea, which she believes may be a side effect of her current medications. The patient's appetite and energy levels have been fluctuating, and she often does not feel ready to eat until later in the morning. The patient currently lives with her family member, who  does not believe it is safe for the patient to live alone due to her memory issues.         All other systems were reviewed with the patient and are negative.  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   Hyperlipidemia    Hyperparathyroidism (HCC) 07/25/2019   Hypertension    Normal cardiac stress test    Myoview stress test   OAB (overactive bladder) 09/01/2015   Osteopenia 05/2018   T score -1.1 FRAX 2.5%/ 0.1%   Subclinical hyperthyroidism 05/08/2007   Qualifier: Diagnosis of  By: Kennyth MD, Delon      SURGICAL HISTORY: Past Surgical History:  Procedure Laterality Date   BREAST BIOPSY Left 09/19/2023   x's 2   BREAST BIOPSY Left 09/19/2023   US  LT BREAST BX W LOC DEV 1ST LESION IMG BX SPEC US  GUIDE 09/19/2023 [facility]   BREAST BIOPSY Left 10/03/2023   US  LT RADIOACTIVE SEED LOC 10/03/2023 [facility]   BREAST BIOPSY Left 10/03/2023   US  LT RADIOACTIVE SEED EA ADD LESION 10/03/2023 [facility]   BREAST LUMPECTOMY WITH RADIOACTIVE SEED AND AXILLARY LYMPH NODE DISSECTION Left 10/04/2023  Procedure: LEFT BREAST LUMPECTOMY AND TARGETED LYMPH NODE DISSECTION;  Surgeon: [name] Berg, MD;  Location: [facility];  Service: General;  Laterality: Left;   COLONOSCOPY     POLYPECTOMY     TOTAL HIP ARTHROPLASTY Left 10/01/2013   DR LIAM   TOTAL HIP ARTHROPLASTY Left 10/01/2013   Procedure: TOTAL HIP ARTHROPLASTY;  Surgeon: Dempsey JINNY Liam, MD;  Location: [facility];  Service: Orthopedics;  Laterality: Left;    I have reviewed the social history and family history with the  patient and they are unchanged from previous note.  ALLERGIES:  is allergic to hctz [hydrochlorothiazide ], hydrocodone , and simvastatin.  MEDICATIONS:  Current Outpatient Medications  Medication Sig Dispense Refill   albuterol  (VENTOLIN  HFA) 108 (90 Base) MCG/ACT inhaler USE 2 INHALATIONS BY MOUTH EVERY 6 HOURS AS NEEDED FOR WHEEZING  OR SHORTNESS OF BREATH 26.8 g 2   amLODipine  (NORVASC ) 10 MG tablet TAKE 1 TABLET BY MOUTH EVERY  MORNING 100 tablet 2   Budesonide  90 MCG/ACT inhaler Inhale 1 puff into the lungs 2 (two) times daily. 1 each 0   capsaicin  (ZOSTRIX) 0.025 % cream Apply topically 2 (two) times daily. 60 g 0   cholecalciferol (VITAMIN D3) 25 MCG (1000 UNIT) tablet Take 1 tablet (1,000 Units total) by mouth daily. 7 tablet 0   donepezil (ARICEPT ODT) 10 MG disintegrating tablet Take 10 mg by mouth at bedtime.     fluticasone  (FLONASE ) 50 MCG/ACT nasal spray USE 1 SPRAY IN BOTH NOSTRILS  DAILY 16 g 0   fluticasone  (FLOVENT  HFA) 110 MCG/ACT inhaler Inhale 1 puff into the lungs daily as needed. 1 each 12   gabapentin  (NEURONTIN ) 100 MG capsule Take 1 capsule (100 mg total) by mouth daily. 90 capsule 0   loratadine  (EQ ALLERGY RELIEF) 10 MG tablet Take 1 tablet (10 mg total) by mouth daily. (Patient not taking: Reported on 09/21/2023) 90 tablet 3   losartan  (COZAAR ) 50 MG tablet Take 1 tablet (50 mg total) by mouth at bedtime. 90 tablet 3   meloxicam  (MOBIC ) 15 MG tablet Take 1 tablet by mouth once daily 30 tablet 0   metoprolol  tartrate (LOPRESSOR ) 50 MG tablet TAKE 1 TABLET BY MOUTH TWICE  DAILY 200 tablet 2   rosuvastatin  (CRESTOR ) 5 MG tablet Take one tablet by mouth every Monday and Friday. 20 tablet 4   sertraline  (ZOLOFT ) 50 MG tablet Take 1 tablet (50 mg total) by mouth daily. 90 tablet 1   traMADol  (ULTRAM ) 50 MG tablet Take 1 tablet (50 mg total) by mouth every 6 (six) hours as needed for moderate pain (pain score 4-6) or severe pain (pain score 7-10). 25 tablet 0   No current  facility-administered medications for this visit.    PHYSICAL EXAMINATION: ECOG PERFORMANCE STATUS: 1 - Symptomatic but completely ambulatory  Vitals:   10/19/23 0901  BP: 137/67  Pulse: 63  Resp: 15  Temp: (!) 97.3 F (36.3 C)  SpO2: 98%   Wt Readings from Last 3 Encounters:  10/19/23 193 lb 8 oz (87.8 kg)  10/05/23 196 lb 9.6 oz (89.2 kg)  10/04/23 194 lb 14.2 oz (88.4 kg)     GENERAL:alert, no distress and comfortable SKIN: skin color, texture, turgor are normal, no rashes or significant lesions EYES: normal, Conjunctiva are pink and non-injected, sclera clear NECK: supple, thyroid  normal size, non-tender, without nodularity LYMPH:  no palpable lymphadenopathy in the cervical, axillary  LUNGS: clear to auscultation and percussion with normal breathing effort HEART: regular  rate & rhythm and no murmurs and no lower extremity edema ABDOMEN:abdomen soft, non-tender and normal bowel sounds Musculoskeletal:no cyanosis of digits and no clubbing  NEURO: alert & oriented x 3 with fluent speech, no focal motor/sensory deficits Breat: right breast Incision healing well, no signs of infection or complications observed. Mild tenderness at incision site.      LABORATORY DATA:  I have reviewed the data as listed    Latest Ref Rng & Units 08/25/2021   10:25 AM 10/30/2019    2:53 PM 07/25/2019    2:13 PM  CBC  WBC 3.4 - 10.8 x10E3/uL 6.8  8.2  7.0   Hemoglobin 11.1 - 15.9 g/dL 87.7  87.4  87.4   Hematocrit 34.0 - 46.6 % 36.3  38.9  38.5   Platelets 150 - 450 x10E3/uL 306  304  298         Latest Ref Rng & Units 12/29/2021    9:53 AM 11/30/2021   10:40 AM 04/06/2021    8:06 AM  CMP  Glucose 70 - 99 mg/dL 92  84    BUN 8 - 27 mg/dL 7  6    Creatinine 9.42 - 1.00 mg/dL 9.21  9.19    Sodium 865 - 144 mmol/L 141  144    Potassium 3.5 - 5.2 mmol/L 4.1  4.0    Chloride 96 - 106 mmol/L 106  107    CO2 20 - 29 mmol/L 24  24    Calcium  8.7 - 10.3 mg/dL 89.1  89.7  89.5   Total  Protein 6.0 - 8.5 g/dL  6.9  7.2   Total Bilirubin 0.0 - 1.2 mg/dL  0.2  0.4   Alkaline Phos 44 - 121 IU/L  141  159    131   AST 0 - 40 IU/L  20  16   ALT 0 - 32 IU/L  28  13       RADIOGRAPHIC STUDIES: I have personally reviewed the radiological images as listed and agreed with the findings in the report. No results found.    Orders Placed This Encounter  Procedures   CT CHEST ABDOMEN PELVIS W CONTRAST    Standing Status:   Future    Expected Date:   11/02/2023    Expiration Date:   10/18/2024    If indicated for the ordered procedure, I authorize the administration of contrast media per Radiology protocol:   Yes    Does the patient have a contrast media/X-ray dye allergy?:   No    Preferred imaging location?:   [facility]    If indicated for the ordered procedure, I authorize the administration of oral contrast media per Radiology protocol:   Yes   NM Bone Scan Whole Body    Standing Status:   Future    Expected Date:   11/02/2023    Expiration Date:   10/18/2024    If indicated for the ordered procedure, I authorize the administration of a radiopharmaceutical per Radiology protocol:   Yes    Preferred imaging location?:   [facility]   All questions were answered. The patient knows to call the clinic with any problems, questions or concerns. No barriers to learning was detected. The total time spent in the appointment was 40 minutes.     Onita Mattock, MD 10/19/2023

## 2023-10-20 ENCOUNTER — Emergency Department (HOSPITAL_COMMUNITY): Payer: 59

## 2023-10-20 ENCOUNTER — Emergency Department (HOSPITAL_COMMUNITY)
Admission: EM | Admit: 2023-10-20 | Discharge: 2023-10-20 | Disposition: A | Discharge status: Home / Self Care | Payer: 59 | Attending: Emergency Medicine | Admitting: Emergency Medicine

## 2023-10-20 DIAGNOSIS — Z96642 Presence of left artificial hip joint: Secondary | ICD-10-CM | POA: Diagnosis not present

## 2023-10-20 DIAGNOSIS — Z7951 Long term (current) use of inhaled steroids: Secondary | ICD-10-CM | POA: Diagnosis not present

## 2023-10-20 DIAGNOSIS — R4182 Altered mental status, unspecified: Secondary | ICD-10-CM | POA: Diagnosis not present

## 2023-10-20 DIAGNOSIS — I6782 Cerebral ischemia: Secondary | ICD-10-CM | POA: Diagnosis not present

## 2023-10-20 DIAGNOSIS — Z79899 Other long term (current) drug therapy: Secondary | ICD-10-CM | POA: Insufficient documentation

## 2023-10-20 DIAGNOSIS — I10 Essential (primary) hypertension: Secondary | ICD-10-CM | POA: Diagnosis not present

## 2023-10-20 DIAGNOSIS — M6281 Muscle weakness (generalized): Secondary | ICD-10-CM | POA: Diagnosis present

## 2023-10-20 DIAGNOSIS — R930 Abnormal findings on diagnostic imaging of skull and head, not elsewhere classified: Secondary | ICD-10-CM | POA: Diagnosis not present

## 2023-10-20 DIAGNOSIS — I639 Cerebral infarction, unspecified: Secondary | ICD-10-CM | POA: Diagnosis present

## 2023-10-20 DIAGNOSIS — C50919 Malignant neoplasm of unspecified site of unspecified female breast: Secondary | ICD-10-CM | POA: Diagnosis not present

## 2023-10-20 DIAGNOSIS — R531 Weakness: Secondary | ICD-10-CM | POA: Diagnosis not present

## 2023-10-20 DIAGNOSIS — J45909 Unspecified asthma, uncomplicated: Secondary | ICD-10-CM | POA: Diagnosis not present

## 2023-10-20 DIAGNOSIS — R29818 Other symptoms and signs involving the nervous system: Secondary | ICD-10-CM | POA: Diagnosis not present

## 2023-10-20 DIAGNOSIS — G459 Transient cerebral ischemic attack, unspecified: Secondary | ICD-10-CM | POA: Diagnosis not present

## 2023-10-20 DIAGNOSIS — R410 Disorientation, unspecified: Secondary | ICD-10-CM | POA: Insufficient documentation

## 2023-10-20 DIAGNOSIS — R29810 Facial weakness: Secondary | ICD-10-CM | POA: Insufficient documentation

## 2023-10-20 DIAGNOSIS — G8190 Hemiplegia, unspecified affecting unspecified side: Secondary | ICD-10-CM | POA: Diagnosis not present

## 2023-10-20 LAB — CBC
HCT: 39.5 % (ref 36.0–46.0)
Hemoglobin: 12.4 g/dL (ref 12.0–15.0)
MCH: 27.9 pg (ref 26.0–34.0)
MCHC: 31.4 g/dL (ref 30.0–36.0)
MCV: 88.8 fL (ref 80.0–100.0)
Platelets: 306 10*3/uL (ref 150–400)
RBC: 4.45 MIL/uL (ref 3.87–5.11)
RDW: 12.9 % (ref 11.5–15.5)
WBC: 8.6 10*3/uL (ref 4.0–10.5)
nRBC: 0 % (ref 0.0–0.2)

## 2023-10-20 LAB — COMPREHENSIVE METABOLIC PANEL
ALT: 17 U/L (ref 0–44)
AST: 19 U/L (ref 15–41)
Albumin: 3.6 g/dL (ref 3.5–5.0)
Alkaline Phosphatase: 118 U/L (ref 38–126)
Anion gap: 10 (ref 5–15)
BUN: 7 mg/dL — ABNORMAL LOW (ref 8–23)
CO2: 24 mmol/L (ref 22–32)
Calcium: 10.4 mg/dL — ABNORMAL HIGH (ref 8.9–10.3)
Chloride: 108 mmol/L (ref 98–111)
Creatinine, Ser: 0.76 mg/dL (ref 0.44–1.00)
GFR, Estimated: >60 mL/min (ref >60)
Glucose, Bld: 106 mg/dL — ABNORMAL HIGH (ref 70–99)
Potassium: 3.9 mmol/L (ref 3.5–5.1)
Sodium: 142 mmol/L (ref 135–145)
Total Bilirubin: 0.5 mg/dL (ref 0.0–1.2)
Total Protein: 7.1 g/dL (ref 6.5–8.1)

## 2023-10-20 LAB — DIFFERENTIAL
Abs Immature Granulocytes: 0.01 10*3/uL (ref 0.00–0.07)
Basophils Absolute: 0.1 10*3/uL (ref 0.0–0.1)
Basophils Relative: 1 %
Eosinophils Absolute: 0.4 10*3/uL (ref 0.0–0.5)
Eosinophils Relative: 4 %
Immature Granulocytes: 0 %
Lymphocytes Relative: 47 %
Lymphs Abs: 4 10*3/uL (ref 0.7–4.0)
Monocytes Absolute: 0.6 10*3/uL (ref 0.1–1.0)
Monocytes Relative: 7 %
Neutro Abs: 3.5 10*3/uL (ref 1.7–7.7)
Neutrophils Relative %: 41 %

## 2023-10-20 LAB — I-STAT CHEM 8, ED
BUN: 5 mg/dL — ABNORMAL LOW (ref 8–23)
Calcium, Ion: 1.32 mmol/L (ref 1.15–1.40)
Chloride: 108 mmol/L (ref 98–111)
Creatinine, Ser: 0.8 mg/dL (ref 0.44–1.00)
Glucose, Bld: 102 mg/dL — ABNORMAL HIGH (ref 70–99)
HCT: 38 % (ref 36.0–46.0)
Hemoglobin: 12.9 g/dL (ref 12.0–15.0)
Potassium: 3.8 mmol/L (ref 3.5–5.1)
Sodium: 143 mmol/L (ref 135–145)
TCO2: 24 mmol/L (ref 22–32)

## 2023-10-20 LAB — PROTIME-INR
INR: 1.4 — ABNORMAL HIGH (ref 0.8–1.2)
Prothrombin Time: 16.9 s — ABNORMAL HIGH (ref 11.4–15.2)

## 2023-10-20 LAB — APTT: aPTT: 27 s (ref 24–36)

## 2023-10-20 LAB — CBG MONITORING, ED: Glucose-Capillary: 97 mg/dL (ref 70–99)

## 2023-10-20 LAB — ETHANOL: Alcohol, Ethyl (B): <10 mg/dL (ref <10)

## 2023-10-20 MED ORDER — SODIUM CHLORIDE 0.9% FLUSH
3.0000 mL | Freq: Once | INTRAVENOUS | Status: AC
  Administered 2023-10-20: 3 mL via INTRAVENOUS

## 2023-10-20 MED ORDER — GADOBUTROL 1 MMOL/ML IV SOLN
10.0000 mL | Freq: Once | INTRAVENOUS | Status: AC | PRN
  Administered 2023-10-20: 10 mL via INTRAVENOUS

## 2023-10-20 MED ORDER — ACETAMINOPHEN 325 MG PO TABS
650.0000 mg | ORAL_TABLET | Freq: Once | ORAL | Status: AC
  Administered 2023-10-20: 650 mg via ORAL
  Filled 2023-10-20: qty 2

## 2023-10-20 NOTE — Code Documentation (Signed)
 Stroke Response Nurse Documentation Code Documentation  [name] is a 68 y.o. female arriving to [facility]  via [facility] on 10/19/2022 with past medical hx of HLD, HTN. On No antithrombotic. Code stroke was activated by ED.   Patient from home where she was LKW at 1000 and now complaining of left sided weakness and left sided facial droop. Family was with pt at 1000 and she was in her normal state. At 1100 they checked on her and the pt stated she felt like something was happening and that her left side felt heavy. When her daughter tried to help her stand she noticed pt was weak on her left side.    Stroke team at the bedside on patient arrival. Labs drawn and patient cleared for CT by Dr. Towana. Patient to CT with team.   NIHSS 1, see documentation for details and code stroke times. Patient with disoriented on exam.   The following imaging was completed:  CT Head.   Patient is not a candidate for IV Thrombolytic due to NIHSS significant for disorientation to month. Patient is not a candidate for IR due to exam negative for LVO.   Care Plan: q30 min NIHSS and VS until 1430.   Bedside handoff with ED RN Chloe.    Marget Outten L Deneise Getty  Rapid Response RN

## 2023-10-20 NOTE — Consult Note (Signed)
 NEUROLOGY CONSULT NOTE   Date of service: October 20, 2023 Patient Name: [name] MRN:  [id number] DOB:  21-Sep-1956 Chief Complaint: Code stroke for left-sided weakness Requesting Provider: Towana Ozell BROCKS, MD  History of Present Illness  JERICHA Mcclure is a 68 y.o. female  has a past medical history of Allergy, Anxiety, Arthritis, Asthma, Atypical chest pain (05/03/2015), Carpal tunnel syndrome, bilateral, Chest pain, Depression, Elevated PTHrP level (06/19/2018), Feeling grief (12/30/2015), Heart murmur, Hyperlipidemia, Hyperparathyroidism (HCC) (07/25/2019), Hypertension, Normal cardiac stress test, OAB (overactive bladder) (09/01/2015), Osteopenia (05/2018), and Subclinical hyperthyroidism (05/08/2007).  Also cancer of the left breast with recent lumpectomy who presents with sudden onset left-sided weakness, feeling nonspecifically unwell and difficulty ambulating.  Patient states that she does not quite remember why she came to the hospital today but that she was just feeling nonspecifically unwell and nauseated.  Patient's daughter states that at about 11:00 she checked on the patient and the patient stated that her left side felt heavy and had difficulty ambulating.  Upon EMS arrival, she was noted to be leaning to the left, but deficits had resolved by the time of exam in the hospital.  Patient has mild dementia but at baseline is able to care for herself, although she is not always oriented to time.   LKW: 1000 Modified rankin score: 1-No significant post stroke disability and can perform usual duties with stroke symptoms IV Thrombolysis: No, deficits resolved EVT: No, deficits resolved and exam not consistent with LVO  NIHSS components Score: Comment  1a Level of Conscious 0[x]  1[]  2[]  3[]      1b LOC Questions 0[]  1[x]  2[]       1c LOC Commands 0[x]  1[]  2[]       2 Best Gaze 0[x]  1[]  2[]       3 Visual 0[x]  1[]  2[]  3[]      4 Facial Palsy 0[x]  1[]  2[]  3[]      5a Motor Arm -  left 0[x]  1[]  2[]  3[]  4[]  UN[]    5b Motor Arm - Right 0[x]  1[]  2[]  3[]  4[]  UN[]    6a Motor Leg - Left 0[x]  1[]  2[]  3[]  4[]  UN[]    6b Motor Leg - Right 0[x]  1[]  2[]  3[]  4[]  UN[]    7 Limb Ataxia 0[x]  1[]  2[]  3[]  UN[]     8 Sensory 0[x]  1[]  2[]  UN[]      9 Best Language 0[x]  1[]  2[]  3[]      10 Dysarthria 0[x]  1[]  2[]  UN[]      11 Extinct. 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   Hyperlipidemia    Hyperparathyroidism (HCC) 07/25/2019   Hypertension    Normal cardiac stress test    Myoview stress test   OAB (overactive bladder) 09/01/2015   Osteopenia 05/2018   T score -1.1 FRAX 2.5%/ 0.1%   Subclinical hyperthyroidism 05/08/2007  Qualifier: Diagnosis of  By: Kennyth MD, Delon      Past Surgical History:  Procedure Laterality Date   BREAST BIOPSY Left 09/19/2023   x's 2   BREAST BIOPSY Left 09/19/2023   US  LT BREAST BX W LOC DEV 1ST LESION IMG BX SPEC US  GUIDE 09/19/2023 [facility]   BREAST BIOPSY Left 10/03/2023   US  LT RADIOACTIVE SEED LOC 10/03/2023 [facility]   BREAST BIOPSY Left 10/03/2023   US  LT RADIOACTIVE SEED EA ADD LESION 10/03/2023 [facility]   BREAST LUMPECTOMY WITH RADIOACTIVE SEED AND AXILLARY LYMPH NODE DISSECTION Left 10/04/2023   Procedure: LEFT BREAST LUMPECTOMY AND TARGETED LYMPH NODE DISSECTION;  Surgeon: Vernetta Berg, MD;  Location: [facility];  Service: General;  Laterality: Left;   COLONOSCOPY     POLYPECTOMY     TOTAL  HIP ARTHROPLASTY Left 10/01/2013   DR LIAM   TOTAL HIP ARTHROPLASTY Left 10/01/2013   Procedure: TOTAL HIP ARTHROPLASTY;  Surgeon: Dempsey JINNY Liam, MD;  Location: [facility];  Service: Orthopedics;  Laterality: Left;    Family History: Family History  Problem Relation Age of Onset   Hypertension Mother    Diabetes Father    Hyperlipidemia Brother    Breast cancer Paternal Grandmother 61 - 71   Heart attack Neg Hx    Sudden death Neg Hx    Colon cancer Neg Hx    Hypercalcemia Neg Hx     Social History  reports that she has never smoked. She has never been exposed to tobacco smoke. She has never used smokeless tobacco. She reports that she does not drink alcohol and does not use drugs.  Allergies  Allergen Reactions   Hctz [Hydrochlorothiazide ] Other (See Comments)    Stopped after syncopal spell- unclear if it was related   Hydrocodone      Mood disturbance   Simvastatin     REACTION: muscle aches, GI complaints    Medications   Current Facility-Administered Medications:    sodium chloride  flush (NS) 0.9 % injection 3 mL, 3 mL, Intravenous, Once, Towana Ozell BROCKS, MD  Current Outpatient Medications:    albuterol  (VENTOLIN  HFA) 108 (90 Base) MCG/ACT inhaler, USE 2 INHALATIONS BY MOUTH EVERY 6 HOURS AS NEEDED FOR WHEEZING  OR SHORTNESS OF BREATH, Disp: 26.8 g, Rfl: 2   amLODipine  (NORVASC ) 10 MG tablet, TAKE 1 TABLET BY MOUTH EVERY  MORNING, Disp: 100 tablet, Rfl: 2   Budesonide  90 MCG/ACT inhaler, Inhale 1 puff into the lungs 2 (two) times daily., Disp: 1 each, Rfl: 0   capsaicin  (ZOSTRIX) 0.025 % cream, Apply topically 2 (two) times daily., Disp: 60 g, Rfl: 0   cholecalciferol (VITAMIN D3) 25 MCG (1000 UNIT) tablet, Take 1 tablet (1,000 Units total) by mouth daily., Disp: 7 tablet, Rfl: 0   donepezil (ARICEPT ODT) 10 MG disintegrating tablet, Take 10 mg by mouth at bedtime., Disp: , Rfl:    fluticasone  (FLONASE ) 50 MCG/ACT nasal spray, USE 1 SPRAY IN BOTH NOSTRILS  DAILY, Disp:  16 g, Rfl: 0   fluticasone  (FLOVENT  HFA) 110 MCG/ACT inhaler, Inhale 1 puff into the lungs daily as needed., Disp: 1 each, Rfl: 12   gabapentin  (NEURONTIN ) 100 MG capsule, Take 1 capsule (100 mg total) by mouth daily., Disp: 90 capsule, Rfl: 0   loratadine  (EQ ALLERGY RELIEF) 10 MG tablet, Take 1 tablet (10 mg total) by mouth daily. (Patient not taking: Reported on 09/21/2023), Disp: 90 tablet, Rfl: 3   losartan  (COZAAR ) 50 MG tablet,  Take 1 tablet (50 mg total) by mouth at bedtime., Disp: 90 tablet, Rfl: 3   meloxicam  (MOBIC ) 15 MG tablet, Take 1 tablet by mouth once daily, Disp: 30 tablet, Rfl: 0   metoprolol  tartrate (LOPRESSOR ) 50 MG tablet, TAKE 1 TABLET BY MOUTH TWICE  DAILY, Disp: 200 tablet, Rfl: 2   rosuvastatin  (CRESTOR ) 5 MG tablet, Take one tablet by mouth every Monday and Friday., Disp: 20 tablet, Rfl: 4   sertraline  (ZOLOFT ) 50 MG tablet, Take 1 tablet (50 mg total) by mouth daily., Disp: 90 tablet, Rfl: 1   traMADol  (ULTRAM ) 50 MG tablet, Take 1 tablet (50 mg total) by mouth every 6 (six) hours as needed for moderate pain (pain score 4-6) or severe pain (pain score 7-10)., Disp: 25 tablet, Rfl: 0  Vitals   Vitals:   2023-10-31 1200 2023-10-31 1201  BP: (!) 174/89   Pulse: 72   SpO2: 100%   Weight:  89.4 kg    Body mass index is 36.05 kg/m.  Physical Exam   Constitutional: Appears well-developed and well-nourished.  Psych: Affect appropriate to situation.  Eyes: No scleral injection.  HENT: No OP obstruction.  Head: Normocephalic.  Cardiovascular: Normal rate and regular rhythm.  Respiratory: Effort normal, non-labored breathing.  Skin: WDI.   Neurologic Examination    NEURO:  Mental Status: Oriented to person and place, disoriented to time (gets month wrong) and has unclear memory about events of earlier in the day Speech/Language: speech is without dysarthria or aphasia.  Naming, repetition, fluency, and comprehension intact.  Cranial Nerves:  II: PERRL. Visual  fields full.  III, IV, VI: EOMI. Eyelids elevate symmetrically.  V: Sensation is intact to light touch and symmetrical to face.  VII: Smile is symmetrical, although subtle left-sided facial droop was initially seen on patient's arrival and has now resolved VIII: hearing intact to voice. IX, X: Phonation is normal.  XII: tongue is midline without fasciculations. Motor: 5/5 strength to all muscle groups tested.  Tone: is normal and bulk is normal Sensation- Intact to light touch bilaterally. Extinction absent to light touch to DSS.  Coordination: FTN intact bilaterally, HKS: no ataxia in BLE. Gait- deferred   Labs/Imaging/Neurodiagnostic studies   CBC:  Recent Labs  Lab 10-31-2023 1159 2023-10-31 1204  WBC 8.6  --   NEUTROABS 3.5  --   HGB 12.4 12.9  HCT 39.5 38.0  MCV 88.8  --   PLT 306  --    Basic Metabolic Panel:  Lab Results  Component Value Date   NA 143 10/31/23   K 3.8 2023-10-31   CO2 24 10/31/2023   GLUCOSE 102 (H) 31-Oct-2023   BUN 5 (L) 31-Oct-2023   CREATININE 0.80 Oct 31, 2023   CALCIUM  10.4 (H) October 31, 2023   GFRNONAA >60 Oct 31, 2023   GFRAA 95 10/12/2020   Lipid Panel:  Lab Results  Component Value Date   LDLCALC 147 (H) 09/08/2019   HgbA1c:  Lab Results  Component Value Date   HGBA1C 5.2 05/03/2015   Urine Drug Screen: No results found for: LABOPIA, COCAINSCRNUR, LABBENZ, AMPHETMU, THCU, LABBARB  Alcohol Level     Component Value Date/Time   ETH <10 2023-10-31 1159   INR  Lab Results  Component Value Date   INR 1.4 (H) 10-31-23   APTT  Lab Results  Component Value Date   APTT 27 2023-10-31   AED levels: No results found for: PHENYTOIN, ZONISAMIDE, LAMOTRIGINE, LEVETIRACETA  CT Head without contrast(Personally reviewed): No acute abnormality  CT angio Head  and Neck with contrast(Personally reviewed): Pending  MRI Brain(Personally reviewed): Pending  Neurodiagnostics rEEG:  Pending  ASSESSMENT   TEKEISHA HAKIM is a 68 y.o. female  has a past medical history of Allergy, Anxiety, Arthritis, Asthma, Atypical chest pain (05/03/2015), Carpal tunnel syndrome, bilateral, Chest pain, Depression, Elevated PTHrP level (06/19/2018), Feeling grief (12/30/2015), Heart murmur, Hyperlipidemia, Hyperparathyroidism (HCC) (07/25/2019), Hypertension, Normal cardiac stress test, OAB (overactive bladder) (09/01/2015), Osteopenia (05/2018), and Subclinical hyperthyroidism (05/08/2007).  As well as left breast cancer with recent lumpectomy presents with acute onset left-sided weakness.  Patient has baseline dementia but is typically able to care for herself, although orientation to time fluctuates.  This morning, she was noted by family to be complaining that she felt unwell with left-sided heaviness and difficulty with ambulation.  EMS was called, and she was brought in as a code stroke.  She was noted to be leaning to the left when EMS arrived, but deficits have largely resolved by the time she arrived to the hospital.  She was noted to be disoriented to time and had unclear memory of the events of earlier today.  Given resolution of deficits, TNK was administered, and exam was not consistent with LVO.  Will obtain brain MRI with and without contrast to assess for stroke and brain metastases.  Small stroke or TIA are definitely on the differential, asses unwitnessed seizure given patient's risk factor of dementia.  Will proceed with full stroke workup if MRI positive.  RECOMMENDATIONS  -MRI brain with and without contrast to assess for stroke or brain mets - Frequent NIHSS until outside of TNK window - Routine EEG ______________________________________________________________________  Patient seen by NP with MD, MD to edit note as needed.  Signed, Cortney E Everitt Clint Kill, NP [facility]  NEUROHOSPITALIST ADDENDUM Performed a face to face diagnostic evaluation.   I have reviewed the contents of history and  physical exam as documented by PA/ARNP/Resident and agree with above documentation.  I have discussed and formulated the above plan as documented. Edits to the note have been made as needed.   Etienne Mowers, MD [facility] [phone]   If 7pm to 7am, please call on call as listed on AMION.

## 2023-10-20 NOTE — Discharge Instructions (Signed)
 You were seen in the emergency department for an episode of confusion and possibly some weakness.  You were evaluated by neurology and had a CAT scan and an MRI of your brain that did not show any evidence of stroke.

## 2023-10-20 NOTE — ED Triage Notes (Signed)
 Pt BIB [facility] from home as a code stroke. Pt's last seen normal was 10am this morning. Pt's son found pt w w L side facial droop and L side weakness.

## 2023-10-20 NOTE — Procedures (Signed)
 Routine EEG Report  [name] is a 68 y.o. female with a history of altered mental status who is undergoing an EEG to evaluate for seizures.  Report: This EEG was acquired with electrodes placed according to the International 10-20 electrode system (including Fp1, Fp2, F3, F4, C3, C4, P3, P4, O1, O2, T3, T4, T5, T6, A1, A2, Fz, Cz, Pz). The following electrodes were missing or displaced: none.  The occipital dominant rhythm was 8.5 Hz with overriding beta frequencies. This activity is reactive to stimulation. Drowsiness was manifested by background fragmentation; deeper stages of sleep were not identified. There was no focal slowing. There were no interictal epileptiform discharges. There were no electrographic seizures identified.  Photic stimulation and hyperventilation were not performed.  Impression: This EEG was obtained while awake and drowsy and is normal.    Clinical Correlation: Normal EEGs, however, do not rule out epilepsy.  [name], MD [facility] [phone]  If 7pm- 7am, please page neurology on call as listed in AMION.

## 2023-10-20 NOTE — ED Provider Notes (Signed)
 [place] EMERGENCY DEPARTMENT AT [facility] Provider Note   CSN: [id number] Arrival date & time: 10/20/23  1155     History  No chief complaint on file.   [name] is a 68 y.o. female.  She is presenting as a code stroke activation by EMS.  Last known well was 10 AM.  Family noticed her beginning to be confused and having some left-sided facial droop.  They also appreciated a little left arm and leg weakness.  Possible visual defect.  Patient is slow to answer questions but seems appropriate.  Patient was recently diagnosed with breast mass and had had excision positive for cancer.  She saw oncology yesterday and is awaiting staging.  The history is provided by the patient and the EMS personnel.  Cerebrovascular Accident This is a new problem. The current episode started 1 to 2 hours ago. The problem occurs constantly. The problem has not changed since onset.Nothing aggravates the symptoms. Nothing relieves the symptoms. She has tried nothing for the symptoms. The treatment provided no relief.       Home Medications Prior to Admission medications   Medication Sig Start Date End Date Taking? Authorizing Provider  albuterol  (VENTOLIN  HFA) 108 (90 Base) MCG/ACT inhaler USE 2 INHALATIONS BY MOUTH EVERY 6 HOURS AS NEEDED FOR WHEEZING  OR SHORTNESS OF BREATH 08/29/23   [name], DO  amLODipine  (NORVASC ) 10 MG tablet TAKE 1 TABLET BY MOUTH EVERY  MORNING 01/23/23   Espinoza, Alejandra, DO  Budesonide  90 MCG/ACT inhaler Inhale 1 puff into the lungs 2 (two) times daily. 05/04/21   Brimage, Vondra, DO  capsaicin  (ZOSTRIX) 0.025 % cream Apply topically 2 (two) times daily. 05/23/18   Riccio, Angela C, DO  cholecalciferol (VITAMIN D3) 25 MCG (1000 UNIT) tablet Take 1 tablet (1,000 Units total) by mouth daily. 05/13/22   Espinoza, Alejandra, DO  donepezil (ARICEPT ODT) 10 MG disintegrating tablet Take 10 mg by mouth at bedtime.    [provider]  fluticasone  (FLONASE ) 50  MCG/ACT nasal spray USE 1 SPRAY IN BOTH NOSTRILS  DAILY 04/16/23   [name], DO  fluticasone  (FLOVENT  HFA) 110 MCG/ACT inhaler Inhale 1 puff into the lungs daily as needed. 05/06/21   Brimage, Vondra, DO  gabapentin  (NEURONTIN ) 100 MG capsule Take 1 capsule (100 mg total) by mouth daily. 08/24/23   [name], DO  loratadine  (EQ ALLERGY RELIEF) 10 MG tablet Take 1 tablet (10 mg total) by mouth daily. Patient not taking: Reported on 09/21/2023 10/10/18   Shirley, Jordan, DO  losartan  (COZAAR ) 50 MG tablet Take 1 tablet (50 mg total) by mouth at bedtime. 10/05/23   [name], DO  meloxicam  (MOBIC ) 15 MG tablet Take 1 tablet by mouth once daily 08/08/23   [name], DO  metoprolol  tartrate (LOPRESSOR ) 50 MG tablet TAKE 1 TABLET BY MOUTH TWICE  DAILY 07/17/22   Espinoza, Alejandra, DO  rosuvastatin  (CRESTOR ) 5 MG tablet Take one tablet by mouth every Monday and Friday. 06/25/23   [name], DO  sertraline  (ZOLOFT ) 50 MG tablet Take 1 tablet (50 mg total) by mouth daily. 09/21/23   [name], DO  traMADol  (ULTRAM ) 50 MG tablet Take 1 tablet (50 mg total) by mouth every 6 (six) hours as needed for moderate pain (pain score 4-6) or severe pain (pain score 7-10). 10/04/23   Vernetta Berg, MD      Allergies    Hctz [hydrochlorothiazide ], Hydrocodone , and Simvastatin    Review of Systems  Review of Systems  Physical Exam Updated Vital Signs BP (!) 174/89   Pulse 72   Wt 89.4 kg   SpO2 100%   BMI 36.05 kg/m  Physical Exam Vitals and nursing note reviewed.  Constitutional:      General: She is not in acute distress.    Appearance: Normal appearance. She is well-developed.  HENT:     Head: Normocephalic and atraumatic.  Eyes:     Conjunctiva/sclera: Conjunctivae normal.  Cardiovascular:     Rate and Rhythm: Normal rate and regular rhythm.     Heart sounds: No murmur heard. Pulmonary:     Effort: Pulmonary effort is normal. No respiratory distress.     Breath sounds: Normal  breath sounds.  Abdominal:     Palpations: Abdomen is soft.     Tenderness: There is no abdominal tenderness.  Musculoskeletal:        General: No deformity.     Cervical back: Neck supple.  Skin:    General: Skin is warm and dry.     Capillary Refill: Capillary refill takes less than 2 seconds.  Neurological:     General: No focal deficit present.     Mental Status: She is alert.     Comments: She is slow to answer and follow commands.  She does not have any gross facial asymmetry or slurred speech.  No focal weakness or numbness     ED Results / Procedures / Treatments   Labs (all labs ordered are listed, but only abnormal results are displayed) Labs Reviewed  PROTIME-INR - Abnormal; Notable for the following components:      Result Value   Prothrombin Time 16.9 (*)    INR 1.4 (*)    All other components within normal limits  COMPREHENSIVE METABOLIC PANEL - Abnormal; Notable for the following components:   Glucose, Bld 106 (*)    BUN 7 (*)    Calcium  10.4 (*)    All other components within normal limits  I-STAT CHEM 8, ED - Abnormal; Notable for the following components:   BUN 5 (*)    Glucose, Bld 102 (*)    All other components within normal limits  APTT  CBC  DIFFERENTIAL  ETHANOL  CBG MONITORING, ED    EKG EKG Interpretation Date/Time:  Saturday October 20 2023 12:22:21 EST Ventricular Rate:  59 PR Interval:  160 QRS Duration:  90 QT Interval:  395 QTC Calculation: 392 R Axis:   2  Text Interpretation: Sinus rhythm Left ventricular hypertrophy No significant change since prior 11/24 Confirmed by Towana Sharper [phone]) on 10/20/2023 12:25:48 PM  Radiology EEG adult Result Date: 10/20/2023 Matthews Elida HERO, MD     10/20/2023  3:39 PM Routine EEG Report [name] is a 68 y.o. female with a history of altered mental status who is undergoing an EEG to evaluate for seizures. Report: This EEG was acquired with electrodes placed according to the International  10-20 electrode system (including Fp1, Fp2, F3, F4, C3, C4, P3, P4, O1, O2, T3, T4, T5, T6, A1, A2, Fz, Cz, Pz). The following electrodes were missing or displaced: none. The occipital dominant rhythm was 8.5 Hz with overriding beta frequencies. This activity is reactive to stimulation. Drowsiness was manifested by background fragmentation; deeper stages of sleep were not identified. There was no focal slowing. There were no interictal epileptiform discharges. There were no electrographic seizures identified.  Photic stimulation and hyperventilation were not performed. Impression: This EEG was obtained while awake  and drowsy and is normal.   Clinical Correlation: Normal EEGs, however, do not rule out epilepsy. [name], MD [facility] [phone] If 7pm- 7am, please page neurology on call as listed in AMION.   MR BRAIN W WO CONTRAST Result Date: 10/20/2023 CLINICAL DATA:  Neuro deficit, acute, stroke suspected. Metastatic disease evaluation. History of breast cancer. Sudden onset left-sided weakness. EXAM: MRI HEAD WITHOUT AND WITH CONTRAST TECHNIQUE: Multiplanar, multiecho pulse sequences of the brain and surrounding structures were obtained without and with intravenous contrast. CONTRAST:  10mL GADAVIST  GADOBUTROL  1 MMOL/ML IV SOLN COMPARISON:  Head CT 10/20/2023 and MRI 01/16/2021 FINDINGS: The study is mildly to moderately motion degraded. Brain: There is no evidence of an acute infarct, intracranial hemorrhage, mass, midline shift, or extra-axial fluid collection. Cerebral white matter T2 hyperintensities are similar to the prior MRI and are nonspecific but compatible with mild chronic small vessel ischemic disease. Cerebral volume is within normal limits for age. The ventricles are normal in size. No abnormal enhancement is identified. 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 No abnormal enhancement is identified. Vascular: Major intracranial vascular flow voids are preserved. Skull  and upper cervical spine: Unremarkable bone marrow signal. Sinuses/Orbits: Unremarkable orbits. Small mucous retention cyst in the left frontal sinus. Clear mastoid air cells. Other: None. IMPRESSION: 1. No evidence of intracranial metastases or acute intracranial abnormality. 2. Mild chronic small vessel ischemic disease. Electronically Signed   By: Dasie Hamburg M.D.   On: 10/20/2023 14:17   CT HEAD CODE STROKE WO CONTRAST Result Date: 10/20/2023 CLINICAL DATA:  Code stroke.  Neuro deficit, acute, stroke suspected EXAM: CT HEAD WITHOUT CONTRAST TECHNIQUE: Contiguous axial images were obtained from the base of the skull through the vertex without intravenous contrast. RADIATION DOSE REDUCTION: This exam was performed according to the departmental dose-optimization program which includes automated exposure control, adjustment of the mA and/or kV according to patient size and/or use of iterative reconstruction technique. COMPARISON:  CT head 07/25/19 FINDINGS: Brain: No hemorrhage. No hydrocephalus. No extra-axial fluid collection. No CT evidence of an acute cortical infarct. No mass effect. No mass lesion. Partially empty sella. Vascular: No hyperdense vessel or unexpected calcification. Skull: Normal. Negative for fracture or focal lesion. Sinuses/Orbits: No middle ear or mastoid effusion. Paranasal sinuses are notable for frothy secretions in the posterior ethmoid air cells on the left. Orbits are unremarkable. Other: None. ASPECTS [facility] Stroke Program Early CT Score): 10 IMPRESSION: No hemorrhage or CT evidence of an acute cortical infarct. Findings were paged to Dr. Vanessa on 10/20/23 at 12:12 PM. Electronically Signed   By: Lyndall Gore M.D.   On: 10/20/2023 12:12    Procedures .Critical Care  Performed by: Towana Ozell BROCKS, MD Authorized by: Towana Ozell BROCKS, MD   Critical care provider statement:    Critical care time (minutes):  45   Critical care time was exclusive of:  Separately billable  procedures and treating other patients   Critical care was necessary to treat or prevent imminent or life-threatening deterioration of the following conditions:  CNS failure or compromise   Critical care was time spent personally by me on the following activities:  Development of treatment plan with patient or surrogate, discussions with consultants, evaluation of patient's response to treatment, examination of patient, obtaining history from patient or surrogate, ordering and performing treatments and interventions, ordering and review of laboratory studies, ordering and review of radiographic studies, pulse oximetry, re-evaluation of patient's condition and review of old charts   I  assumed direction of critical care for this patient from another provider in my specialty: no       Medications Ordered in ED Medications  sodium chloride  flush (NS) 0.9 % injection 3 mL (3 mLs Intravenous Given 10/20/23 1403)  gadobutrol  (GADAVIST ) 1 MMOL/ML injection 10 mL (10 mLs Intravenous Contrast Given 10/20/23 1327)  acetaminophen  (TYLENOL ) tablet 650 mg (650 mg Oral Given 10/20/23 1600)    ED Course/ Medical Decision Making/ A&P Clinical Course as of 10/20/23 1640  Sat Oct 20, 2023  1209 Neurology Dr. Vanessa is ordering MRI with and without contrast.  He said she was recently diagnosed with cancer and he wants to make sure there is not a met. [MB]  1402 Plan from neurology now is EEG and await final reading on MRI.  If no significant findings on this she likely is going to be able to be discharged [MB]  1502 Stable HO from [facility] AMS. Code stroke, negative MRI.  CTH.  Getting EEG now. Likely dispo per neuro.  [CC]    Clinical Course User Index [CC] Jerral Meth, MD [MB] Towana Ozell BROCKS, MD                                 Medical Decision Making Amount and/or Complexity of Data Reviewed Labs: ordered. Radiology: ordered.  Risk OTC drugs.   This patient complains of code stroke activation  possible speech difficulty weakness; this involves an extensive number of treatment Options and is a complaint that carries with it a high risk of complications and morbidity. The differential includes stroke, seizure, bleed, tumor, metabolic derangement, hypoglycemia  I ordered, reviewed and interpreted labs, which included CBC normal chemistries normal I ordered medication oral Tylenol  and reviewed PMP when indicated. I ordered imaging studies which included CT head, MRI brain and I independently    visualized and interpreted imaging which showed no acute findings Additional history obtained from EMS and patient's family members Previous records obtained and reviewed in epic and reviewed including recent oncology notes I consulted neurology Dr. Vanessa and discussed lab and imaging findings and discussed disposition.  Cardiac monitoring reviewed, sinus rhythm Social determinants considered, no significant barriers Critical Interventions: Acute stroke activation requiring bedside presence and multiple reevaluations involvement of consultants and advanced imaging  After the interventions stated above, I reevaluated the patient and found patient's symptoms to be improved and she is neurologically intact Admission and further testing considered, her care is signed out to Dr. Meth to follow-up on final readings of EEG and neurology recommendations.  If negative anticipate may be able to be discharged with close outpatient follow-up.  Patient updated on plan.         Final Clinical Impression(s) / ED Diagnoses Final diagnoses:  Acute confusion    Rx / DC Orders ED Discharge Orders     None         Towana Ozell BROCKS, MD 10/20/23 1644

## 2023-10-20 NOTE — ED Provider Notes (Signed)
 Care of patient received from prior provider at 6:23 PM, please see their note for complete H/P and care plan.  Received handoff per ED course.  Clinical Course as of 10/20/23 1823  Sat Oct 20, 2023  1209 Neurology Dr. Vanessa is ordering MRI with and without contrast.  He said she was recently diagnosed with cancer and he wants to make sure there is not a met. [MB]  1402 Plan from neurology now is EEG and await final reading on MRI.  If no significant findings on this she likely is going to be able to be discharged [MB]  1502 Stable HO from [facility] AMS. Code stroke, negative MRI.  CTH.  Getting EEG now. Likely dispo per neuro.  [CC]    Clinical Course User Index [CC] Jerral Meth, MD [MB] Towana Ozell BROCKS, MD    Disposition:  I have considered need for hospitalization, however, considering all of the above, I believe this patient is stable for discharge at this time.  Patient/family educated about specific return precautions for given chief complaint and symptoms.  Patient/family educated about follow-up with PCP.     Patient/family expressed understanding of return precautions and need for follow-up. Patient spoken to regarding all imaging and laboratory results and appropriate follow up for these results. All education provided in verbal form with additional information in written form. Time was allowed for answering of patient questions. Patient discharged.    Emergency Department Medication Summary:   Medications  sodium chloride  flush (NS) 0.9 % injection 3 mL (3 mLs Intravenous Given 10/20/23 1403)  gadobutrol  (GADAVIST ) 1 MMOL/ML injection 10 mL (10 mLs Intravenous Contrast Given 10/20/23 1327)  acetaminophen  (TYLENOL ) tablet 650 mg (650 mg Oral Given 10/20/23 1600)           Jerral Meth, MD 10/21/23 0007

## 2023-10-20 NOTE — Progress Notes (Signed)
 EEG complete - results pending

## 2023-10-22 ENCOUNTER — Telehealth: Payer: Self-pay | Admitting: Hematology

## 2023-10-22 ENCOUNTER — Other Ambulatory Visit: Payer: Self-pay | Admitting: Student

## 2023-10-22 ENCOUNTER — Telehealth (HOSPITAL_COMMUNITY): Payer: Self-pay

## 2023-10-22 ENCOUNTER — Other Ambulatory Visit: Payer: Self-pay | Admitting: *Deleted

## 2023-10-22 ENCOUNTER — Encounter: Payer: Self-pay | Admitting: *Deleted

## 2023-10-22 DIAGNOSIS — C50412 Malignant neoplasm of upper-outer quadrant of left female breast: Secondary | ICD-10-CM

## 2023-10-22 DIAGNOSIS — F419 Anxiety disorder, unspecified: Secondary | ICD-10-CM

## 2023-10-22 DIAGNOSIS — R4589 Other symptoms and signs involving emotional state: Secondary | ICD-10-CM

## 2023-10-22 NOTE — Progress Notes (Signed)
 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 Vernetta -Left Breast Lumpectomy 10/04/2023   Medical oncologist, treatment if any:   Dr. Lanny 10/19/2023 -I ordered CT chest, abdomen pelvis with contrast and bone scan for staging, to rule out distant metastasis - Schedule follow-up visit in two weeks - Ensure whole-body scan is completed before the follow-up visit -pt and her children will think about chemo      Family History of Breast/Ovarian/Prostate Cancer: Paternal Grandmother had breast cancer.  Lymphedema issues, if any: None     Pain issues, if any: She reports some tenderness at the surgical site.  She has occasional shooting pains in her breast.     SAFETY ISSUES: Prior radiation? No Pacemaker/ICD? No Possible current pregnancy? Postmenopausal Is the patient on methotrexate? No  Current Complaints / other details:

## 2023-10-22 NOTE — Telephone Encounter (Signed)
 Called pt's other daughter to schedule port, no answer, left vm. AB

## 2023-10-22 NOTE — Telephone Encounter (Signed)
 Called pt's daughter to schedule port placement, no answer, left vm. AB

## 2023-10-22 NOTE — Telephone Encounter (Signed)
 Patient is aware of scheduled appointment times/dates per providers request

## 2023-10-23 ENCOUNTER — Ambulatory Visit
Admission: RE | Admit: 2023-10-23 | Discharge: 2023-10-23 | Disposition: A | Discharge status: Home / Self Care | Payer: 59 | Source: Ambulatory Visit | Attending: Radiation Oncology | Admitting: Radiation Oncology

## 2023-10-23 ENCOUNTER — Encounter: Payer: Self-pay | Admitting: Radiation Oncology

## 2023-10-23 ENCOUNTER — Encounter: Payer: Self-pay | Admitting: *Deleted

## 2023-10-23 ENCOUNTER — Telehealth: Payer: Self-pay

## 2023-10-23 VITALS — Ht 62.0 in | Wt 197.0 lb

## 2023-10-23 DIAGNOSIS — C50412 Malignant neoplasm of upper-outer quadrant of left female breast: Secondary | ICD-10-CM | POA: Diagnosis not present

## 2023-10-23 DIAGNOSIS — Z170 Estrogen receptor positive status [ER+]: Secondary | ICD-10-CM

## 2023-10-23 NOTE — Progress Notes (Signed)
 Radiation Oncology         [phone] ________________________________  Name: [name]        MRN: [id number]  Date of Service: 10/23/2023 DOB: 1956-08-10  RR:Gnwzd, Lauraine, DO  [name], MD     REFERRING PHYSICIAN: [name], MD   DIAGNOSIS: The encounter diagnosis was Malignant neoplasm of upper-outer quadrant of left breast in female, estrogen receptor positive (HCC).   HISTORY OF PRESENT ILLNESS: [name] is a 68 y.o. female seen at the request of Dr. Lanny for a diagnosis of left breast cancer. The patient presented on 09/19/2023 with a palpable area in the left breast for several months, she had diagnostic workup that showed a a mass corresponding to the palpable finding measuring 2.7 cm in the upper outer quadrant of the left breast and slightly enlarged left axillary lymph nodes.  By ultrasound, this was noted in the 2 o'clock position of the left breast measuring 3 cm in greatest dimension with internal vascularity.  The axilla demonstrated multiple enlarged lymph nodes, the largest was up to 10 Mcclure.  No abnormalities were noted in the right breast.  Biopsies on 09/19/2023 of the mass showed a grade 2 invasive ductal carcinoma and invasive carcinoma was also noted in the lymph node biopsy.  Her cancer was ER/PR positive HER2 negative with a Ki-67 of 30%.  She was counseled on surgical intervention and was taken on 10/04/2023 by Dr. Vernetta for left lumpectomy and axillary lymph node dissection.  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  Her invasive disease was 1 Mcclure from the lateral margin, the remainder were negative for invasive, and more than 1 cm from all DCIS margins.  14 of 14 lymph nodes were removed from the left axilla and all contained metastatic disease with evidence of extranodal extension and axillary soft tissue involved by carcinoma.  The patient was also worked up on 10/20/2023 for neurologic deficits a CT of  the head was negative for disease or stroke, MRI following this with and without contrast did not show evidence of metastatic disease but showed mild chronic small vessel ischemic disease.  Given the number of positive lymph nodes, Dr. Lanny recommends adjuvant chemotherapy followed by radiation, aromatase inhibitor and CDK 4/6 inhibitor.  She will be having CT and bone scan next week for staging and is seen today to discuss radiation at the appropriate time.    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   Hyperlipidemia    Hyperparathyroidism (HCC) 07/25/2019   Hypertension    Normal cardiac stress test    Myoview stress test   OAB (overactive bladder) 09/01/2015   Osteopenia 05/2018   T score -1.1 FRAX 2.5%/ 0.1%   Subclinical hyperthyroidism 05/08/2007   Qualifier: Diagnosis of  By: Kennyth MD, Delon         PAST SURGICAL HISTORY: Past  Surgical History:  Procedure Laterality Date   BREAST BIOPSY Left 09/19/2023   x's 2   BREAST BIOPSY Left 09/19/2023   US  LT BREAST BX W LOC DEV 1ST LESION IMG BX SPEC US  GUIDE 09/19/2023 [facility]   BREAST BIOPSY Left 10/03/2023   US  LT RADIOACTIVE SEED LOC 10/03/2023 [facility]   BREAST BIOPSY Left 10/03/2023   US  LT RADIOACTIVE SEED EA ADD LESION 10/03/2023 [facility]   BREAST LUMPECTOMY WITH RADIOACTIVE SEED AND AXILLARY LYMPH NODE DISSECTION Left 10/04/2023   Procedure: LEFT BREAST LUMPECTOMY AND TARGETED LYMPH NODE DISSECTION;  Surgeon: Beverly Berg, MD;  Location: [facility]  [place];  Service: General;  Laterality: Left;   COLONOSCOPY     POLYPECTOMY     TOTAL HIP ARTHROPLASTY Left 10/01/2013   DR LIAM   TOTAL HIP ARTHROPLASTY Left 10/01/2013   Procedure: TOTAL HIP ARTHROPLASTY;  Surgeon: Dempsey JINNY Liam, MD;  Location: [facility];  Service: Orthopedics;  Laterality: Left;     FAMILY HISTORY:  Family History  Problem Relation Age of Onset   Hypertension Mother    Diabetes Father    Hyperlipidemia Brother    Breast cancer Paternal Grandmother 12 - 47   Heart attack Neg Hx    Sudden death Neg Hx    Colon cancer Neg Hx    Hypercalcemia Neg Hx      SOCIAL HISTORY:  reports that she has never smoked. She has never been exposed to tobacco smoke. She has never used smokeless tobacco. She reports that she does not drink alcohol and does not use drugs.  The patient is divorced and lives in [place].  She lives with her daughter due to her dementia, but navigates her home well.    ALLERGIES: Hydrochlorothiazide , Hydrocodone , and Simvastatin   MEDICATIONS:  Current Outpatient Medications  Medication Sig Dispense Refill   albuterol  (VENTOLIN  HFA) 108 (90 Base) MCG/ACT inhaler USE 2 INHALATIONS BY MOUTH EVERY 6 HOURS AS NEEDED FOR WHEEZING  OR SHORTNESS OF BREATH (Patient taking differently: Inhale 2 puffs into the lungs every 6 (six) hours as needed for wheezing or shortness of breath.) 26.8 g 2   amLODipine  (NORVASC ) 10 MG tablet TAKE 1 TABLET BY MOUTH EVERY  MORNING 100 tablet 2   Cholecalciferol (VITAMIN D3) 50 MCG (2000 UT) CHEW Chew 2,000 Units by mouth daily.     donepezil (ARICEPT) 10 MG tablet Take 10 mg by mouth at bedtime.     fluticasone  (FLONASE ) 50 MCG/ACT nasal spray USE 1 SPRAY IN BOTH NOSTRILS  DAILY (Patient taking differently: Place 1 spray into both nostrils daily as needed for allergies or rhinitis.) 16 g 0   fluticasone  (FLOVENT  HFA) 110 MCG/ACT inhaler Inhale 1 puff into the lungs daily as needed. (Patient taking differently:  Inhale 1 puff into the lungs daily as needed (for flares).) 1 each 12   gabapentin  (NEURONTIN ) 100 MG capsule Take 1 capsule (100 mg total) by mouth daily. 90 capsule 0   loratadine  (EQ ALLERGY RELIEF) 10 MG tablet Take 1 tablet (10 mg total) by mouth daily. (Patient taking differently: Take 10 mg by mouth daily as needed for allergies or rhinitis.) 90 tablet 3   losartan  (COZAAR ) 25 MG tablet Take 50 mg by mouth at bedtime.     meloxicam  (MOBIC ) 15 MG tablet Take 1 tablet by mouth once daily (Patient taking differently: Take 15 mg by mouth daily as needed for pain.) 30 tablet 0   metoprolol  tartrate (LOPRESSOR ) 50 MG tablet  TAKE 1 TABLET BY MOUTH TWICE  DAILY (Patient taking differently: Take 50 mg by mouth in the morning and at bedtime.) 200 tablet 2   ondansetron  (ZOFRAN -ODT) 4 MG disintegrating tablet Take 4 mg by mouth every 8 (eight) hours as needed for nausea or vomiting (dissolve orally).     rosuvastatin  (CRESTOR ) 5 MG tablet Take one tablet by mouth every Monday and Friday. (Patient taking differently: Take 5 mg by mouth See admin instructions. Take 5 mg by mouth every Monday and Friday) 20 tablet 4   sertraline  (ZOLOFT ) 50 MG tablet Take 1 tablet (50 mg total) by mouth daily. 30 tablet 0   traMADol  (ULTRAM ) 50 MG tablet Take 1 tablet (50 mg total) by mouth every 6 (six) hours as needed for moderate pain (pain score 4-6) or severe pain (pain score 7-10). 25 tablet 0   TYLENOL  500 MG tablet Take 500-1,000 mg by mouth every 6 (six) hours as needed for mild pain (pain score 1-3) or headache.     Budesonide  90 MCG/ACT inhaler Inhale 1 puff into the lungs 2 (two) times daily. (Patient not taking: Reported on 10/20/2023) 1 each 0   capsaicin  (ZOSTRIX) 0.025 % cream Apply topically 2 (two) times daily. (Patient not taking: Reported on 10/20/2023) 60 g 0   cholecalciferol (VITAMIN D3) 25 MCG (1000 UNIT) tablet Take 1 tablet (1,000 Units total) by mouth daily. (Patient not taking: Reported on 10/20/2023) 7  tablet 0   losartan  (COZAAR ) 50 MG tablet Take 1 tablet (50 mg total) by mouth at bedtime. (Patient not taking: Reported on 10/20/2023) 90 tablet 3   No current facility-administered medications for this encounter.     REVIEW OF SYSTEMS: On review of systems, the patient reports that she is doing well. She does have some occasional shooting pain in her breast. No other complaints are verbalized.      PHYSICAL EXAM:  Wt Readings from Last 3 Encounters:  10/23/23 197 lb (89.4 kg)  10/20/23 197 lb 1.5 oz (89.4 kg)  10/19/23 193 lb 8 oz (87.8 kg)   Temp Readings from Last 3 Encounters:  10/20/23 98 F (36.7 C) (Oral)  10/19/23 (!) 97.3 F (36.3 C) (Temporal)  10/04/23 98.3 F (36.8 C)   BP Readings from Last 3 Encounters:  10/20/23 132/74  10/19/23 137/67  10/05/23 (!) 159/83   Pulse Readings from Last 3 Encounters:  10/20/23 62  10/19/23 63  10/05/23 64    In general this is a well appearing African American female in no acute distress. She's alert and oriented x4 and appropriate throughout the examination. Cardiopulmonary assessment is negative for acute distress and she exhibits normal effort. Bilateral breast exam is deferred.    ECOG = 1  0 - Asymptomatic (Fully active, able to carry on all predisease activities without restriction)  1 - Symptomatic but completely ambulatory (Restricted in physically strenuous activity but ambulatory and able to carry out work of a light or sedentary nature. For example, light housework, office work)  2 - Symptomatic, <50% in bed during the day (Ambulatory and capable of all self care but unable to carry out any work activities. Up and about more than 50% of waking hours)  3 - Symptomatic, >50% in bed, but not bedbound (Capable of only limited self-care, confined to bed or chair 50% or more of waking hours)  4 - Bedbound (Completely disabled. Cannot carry on any self-care. Totally confined to bed or chair)  5 - Death   [name],  [name], [name], et al. [phone]). Toxicity and response criteria of the [facility]. Am. [name]. Oncol. 5 (6): 649-55    LABORATORY DATA:  Lab Results  Component Value Date   WBC 8.6 10/20/2023   HGB 12.9 10/20/2023   HCT 38.0 10/20/2023   MCV 88.8 10/20/2023   PLT 306 10/20/2023   Lab Results  Component Value Date   NA 143 10/20/2023   K 3.8 10/20/2023   CL 108 10/20/2023   CO2 24 10/20/2023   Lab Results  Component Value Date   ALT 17 10/20/2023   AST 19 10/20/2023   ALKPHOS 118 10/20/2023   BILITOT 0.5 10/20/2023      RADIOGRAPHY: EEG adult Result Date: 10/20/2023 Beverly Beverly HERO, MD     10/20/2023  3:39 PM Routine EEG Report [name] is a 68 y.o. female with a history of altered mental status who is undergoing an EEG to evaluate for seizures. Report: This EEG was acquired with electrodes placed according to the International 10-20 electrode system (including Fp1, Fp2, F3, F4, C3, C4, P3, P4, O1, O2, T3, T4, T5, T6, A1, A2, Fz, Cz, Pz). The following electrodes were missing or displaced: none. The occipital dominant rhythm was 8.5 Hz with overriding beta frequencies. This activity is reactive to stimulation. Drowsiness was manifested by background fragmentation; deeper stages of sleep were not identified. There was no focal slowing. There were no interictal epileptiform discharges. There were no electrographic seizures identified.  Photic stimulation and hyperventilation were not performed. Impression: This EEG was obtained while awake and drowsy and is normal.   Clinical Correlation: Normal EEGs, however, do not rule out epilepsy. Beverly Matthews, MD [facility] [phone] If 7pm- 7am, please page neurology on call as listed in AMION.   MR BRAIN W WO CONTRAST Result Date: 10/20/2023 CLINICAL DATA:  Neuro deficit, acute, stroke suspected. Metastatic disease evaluation. History of breast cancer. Sudden onset left-sided weakness. EXAM:  MRI HEAD WITHOUT AND WITH CONTRAST TECHNIQUE: Multiplanar, multiecho pulse sequences of the brain and surrounding structures were obtained without and with intravenous contrast. CONTRAST:  10mL GADAVIST  GADOBUTROL  1 MMOL/ML IV SOLN COMPARISON:  Head CT 10/20/2023 and MRI 01/16/2021 FINDINGS: The study is mildly to moderately motion degraded. Brain: There is no evidence of an acute infarct, intracranial hemorrhage, mass, midline shift, or extra-axial fluid collection. Cerebral white matter T2 hyperintensities are similar to the prior MRI and are nonspecific but compatible with mild chronic small vessel ischemic disease. Cerebral volume is within normal limits for age. The ventricles are normal in size. No abnormal enhancement is identified. 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 No abnormal enhancement is identified. Vascular: Major intracranial vascular flow voids are preserved. Skull and upper cervical spine: Unremarkable bone marrow signal. Sinuses/Orbits: Unremarkable orbits. Small mucous retention cyst in the left frontal sinus. Clear mastoid air cells. Other: None. IMPRESSION: 1. No evidence of intracranial metastases or acute intracranial abnormality. 2. Mild chronic small vessel ischemic disease. Electronically Signed   By: [name] M.D.   On: 10/20/2023 14:17   CT HEAD CODE STROKE WO CONTRAST Result Date: 10/20/2023 CLINICAL DATA:  Code stroke.  Neuro deficit, acute, stroke suspected EXAM: CT HEAD WITHOUT CONTRAST TECHNIQUE: Contiguous axial images were obtained from the base of the skull through the vertex without intravenous contrast. RADIATION DOSE REDUCTION: This exam was performed according to the departmental dose-optimization program which includes automated exposure control, adjustment of the mA and/or kV according to patient size  and/or use of iterative reconstruction technique. COMPARISON:  CT head 07/25/19 FINDINGS: Brain: No hemorrhage. No hydrocephalus. No extra-axial fluid  collection. No CT evidence of an acute cortical infarct. No mass effect. No mass lesion. Partially empty sella. Vascular: No hyperdense vessel or unexpected calcification. Skull: Normal. Negative for fracture or focal lesion. Sinuses/Orbits: No middle ear or mastoid effusion. Paranasal sinuses are notable for frothy secretions in the posterior ethmoid air cells on the left. Orbits are unremarkable. Other: None. ASPECTS [facility] Stroke Program Early CT Score): 10 IMPRESSION: No hemorrhage or CT evidence of an acute cortical infarct. Findings were paged to Dr. Vanessa on 10/20/23 at 12:12 PM. Electronically Signed   By: [name] M.D.   On: 10/20/2023 12:12   Mcclure Breast Surgical Specimen Result Date: 10/04/2023 CLINICAL DATA:  Post excision of a left breast mass and left axillary lymph node following radioactive seed localizations. Assess surgical specimens. EXAM: SPECIMEN RADIOGRAPH OF THE LEFT BREAST AND LEFT AXILLA COMPARISON:  Previous exam(s). FINDINGS: Status post excision of the left breast and left axilla. The coil shaped biopsy marker clip and radioactive seed lie within the breast specimen. The radioactive seed and 3 surgical vascular clips lie within the axillary specimen. The spiral HydroMARK clip is not visualized. Surgeon is aware. IMPRESSION: Specimen radiograph of the left breast and left axilla. Electronically Signed   By: Beverly Parkins M.D.   On: 10/04/2023 10:52   Mcclure Breast Surgical Specimen Result Date: 10/04/2023 CLINICAL DATA:  Post excision of a left breast mass and left axillary lymph node following radioactive seed localizations. Assess surgical specimens. EXAM: SPECIMEN RADIOGRAPH OF THE LEFT BREAST AND LEFT AXILLA COMPARISON:  Previous exam(s). FINDINGS: Status post excision of the left breast and left axilla. The coil shaped biopsy marker clip and radioactive seed lie within the breast specimen. The radioactive seed and 3 surgical vascular clips lie within the axillary specimen.  The spiral HydroMARK clip is not visualized. Surgeon is aware. IMPRESSION: Specimen radiograph of the left breast and left axilla. Electronically Signed   By: Beverly Parkins M.D.   On: 10/04/2023 10:52   US  LT RADIOACTIVE SEED LOC Result Date: 10/03/2023 CLINICAL DATA:  68 year old with biopsy-proven grade 2 invasive ductal carcinoma involving the UPPER OUTER QUADRANT of the LEFT breast at posterior depth (coil clip) and a biopsy-proven metastatic LEFT axillary lymph node (HydroMark spiral clip). Radioactive seed localization of both sites is performed in anticipation of lumpectomy and axillary node excision which is scheduled for tomorrow. EXAM: ULTRASOUND GUIDED RADIOACTIVE SEED LOCALIZATION OF THE LEFT BREAST ULTRASOUND GUIDED RADIOACTIVE SEED LOCALIZATION OF THE LEFT AXILLA DIAGNOSTIC LEFT MAMMOGRAM POST RADIOACTIVE SEED PLACEMENT COMPARISON:  Previous exam(s). FINDINGS: Patient presents for radioactive seed localization prior to LEFT breast lumpectomy and LEFT axillary node excision. I met with the patient and we discussed the procedure of seed localization including benefits and alternatives. We discussed the high likelihood of a successful procedure. We discussed the risks of the procedure including infection, bleeding, tissue injury and further surgery. We discussed the low dose of radioactivity involved in the procedure. Informed, written consent was given. The usual time-out protocol was performed immediately prior to the procedure. Using direct ultrasound visualization, sterile technique with chlorhexidine  as skin antisepsis, 1% lidocaine  as local anesthetic, an I-125 radioactive seed was used to localize the biopsy-proven IDC in the UPPER OUTER QUADRANT at 2 o'clock using a lateral approach. Subsequently, a second I-125 radioactive seed was used to localize the biopsy-proven completely replaced metastatic axillary lymph node  using an inferolateral approach. The follow-up full field CC and MLO  mammogram images confirm that both seeds are appropriately positioned. The radioactive seed is present within the mass in the UPPER OUTER QUADRANT adjacent to the coil clip. The radioactive seed is present in the biopsied lymph node adjacent to the [facility] spiral clip. The images are marked for Dr. Vernetta. Follow-up survey of the patient confirms the presence of the radioactive seeds. LEFT breast (5 cm length needle): Order number of I-125 seed: [id number] Total activity: 0.237 mCi Reference Date: 09/05/2023 LEFT axilla (7 cm length needle): Order number of I-125 seed: [id number] Total activity: 0.232 mCi Reference Date: 09/05/2023 The patient tolerated the procedures well without apparent immediate complications. She was [facility] from the [facility] with instructions regarding seed removal. IMPRESSION: 1. Radioactive seed localization of biopsy-proven invasive ductal carcinoma involving the UPPER OUTER QUADRANT of the LEFT breast. 2. Radioactive seed localization of a biopsy-proven metastatic LEFT axillary lymph node. Electronically Signed   By: Beverly Satterfield M.D.   On: 10/03/2023 15:10   US  LT RADIOACTIVE SEED EA ADD LESION Result Date: 10/03/2023 CLINICAL DATA:  68 year old with biopsy-proven grade 2 invasive ductal carcinoma involving the UPPER OUTER QUADRANT of the LEFT breast at posterior depth (coil clip) and a biopsy-proven metastatic LEFT axillary lymph node (HydroMark spiral clip). Radioactive seed localization of both sites is performed in anticipation of lumpectomy and axillary node excision which is scheduled for tomorrow. EXAM: ULTRASOUND GUIDED RADIOACTIVE SEED LOCALIZATION OF THE LEFT BREAST ULTRASOUND GUIDED RADIOACTIVE SEED LOCALIZATION OF THE LEFT AXILLA DIAGNOSTIC LEFT MAMMOGRAM POST RADIOACTIVE SEED PLACEMENT COMPARISON:  Previous exam(s). FINDINGS: Patient presents for radioactive seed localization prior to LEFT breast lumpectomy and LEFT axillary node excision. I met with the  patient and we discussed the procedure of seed localization including benefits and alternatives. We discussed the high likelihood of a successful procedure. We discussed the risks of the procedure including infection, bleeding, tissue injury and further surgery. We discussed the low dose of radioactivity involved in the procedure. Informed, written consent was given. The usual time-out protocol was performed immediately prior to the procedure. Using direct ultrasound visualization, sterile technique with chlorhexidine  as skin antisepsis, 1% lidocaine  as local anesthetic, an I-125 radioactive seed was used to localize the biopsy-proven IDC in the UPPER OUTER QUADRANT at 2 o'clock using a lateral approach. Subsequently, a second I-125 radioactive seed was used to localize the biopsy-proven completely replaced metastatic axillary lymph node using an inferolateral approach. The follow-up full field CC and MLO mammogram images confirm that both seeds are appropriately positioned. The radioactive seed is present within the mass in the UPPER OUTER QUADRANT adjacent to the coil clip. The radioactive seed is present in the biopsied lymph node adjacent to the [facility] spiral clip. The images are marked for Dr. Vernetta. Follow-up survey of the patient confirms the presence of the radioactive seeds. LEFT breast (5 cm length needle): Order number of I-125 seed: [id number] Total activity: 0.237 mCi Reference Date: 09/05/2023 LEFT axilla (7 cm length needle): Order number of I-125 seed: [id number] Total activity: 0.232 mCi Reference Date: 09/05/2023 The patient tolerated the procedures well without apparent immediate complications. She was released from the [facility] with instructions regarding seed removal. IMPRESSION: 1. Radioactive seed localization of biopsy-proven invasive ductal carcinoma involving the UPPER OUTER QUADRANT of the LEFT breast. 2. Radioactive seed localization of a biopsy-proven metastatic LEFT axillary  lymph node. Electronically Signed   By: Beverly Satterfield M.D.   On: 10/03/2023 15:10  Mcclure CLIP PLACEMENT LEFT Result Date: 10/03/2023 CLINICAL DATA:  68 year old with biopsy-proven grade 2 invasive ductal carcinoma involving the UPPER OUTER QUADRANT of the LEFT breast at posterior depth (coil clip) and a biopsy-proven metastatic LEFT axillary lymph node (HydroMark spiral clip). Radioactive seed localization of both sites is performed in anticipation of lumpectomy and axillary node excision which is scheduled for tomorrow. EXAM: ULTRASOUND GUIDED RADIOACTIVE SEED LOCALIZATION OF THE LEFT BREAST ULTRASOUND GUIDED RADIOACTIVE SEED LOCALIZATION OF THE LEFT AXILLA DIAGNOSTIC LEFT MAMMOGRAM POST RADIOACTIVE SEED PLACEMENT COMPARISON:  Previous exam(s). FINDINGS: Patient presents for radioactive seed localization prior to LEFT breast lumpectomy and LEFT axillary node excision. I met with the patient and we discussed the procedure of seed localization including benefits and alternatives. We discussed the high likelihood of a successful procedure. We discussed the risks of the procedure including infection, bleeding, tissue injury and further surgery. We discussed the low dose of radioactivity involved in the procedure. Informed, written consent was given. The usual time-out protocol was performed immediately prior to the procedure. Using direct ultrasound visualization, sterile technique with chlorhexidine  as skin antisepsis, 1% lidocaine  as local anesthetic, an I-125 radioactive seed was used to localize the biopsy-proven IDC in the UPPER OUTER QUADRANT at 2 o'clock using a lateral approach. Subsequently, a second I-125 radioactive seed was used to localize the biopsy-proven completely replaced metastatic axillary lymph node using an inferolateral approach. The follow-up full field CC and MLO mammogram images confirm that both seeds are appropriately positioned. The radioactive seed is present within the mass in the  UPPER OUTER QUADRANT adjacent to the coil clip. The radioactive seed is present in the biopsied lymph node adjacent to the [facility] spiral clip. The images are marked for Dr. Vernetta. Follow-up survey of the patient confirms the presence of the radioactive seeds. LEFT breast (5 cm length needle): Order number of I-125 seed: [id number] Total activity: 0.237 mCi Reference Date: 09/05/2023 LEFT axilla (7 cm length needle): Order number of I-125 seed: [id number] Total activity: 0.232 mCi Reference Date: 09/05/2023 The patient tolerated the procedures well without apparent immediate complications. [facility] seed removal. IMPRESSION: 1. Radioactive seed localization of biopsy-proven invasive ductal carcinoma involving the UPPER OUTER QUADRANT of the LEFT breast. 2. Radioactive seed localization of a biopsy-proven metastatic LEFT axillary lymph node. Electronically Signed   By: Beverly Satterfield M.D.   On: 10/03/2023 15:10       IMPRESSION/PLAN: 1. 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 Dr. Dewey discusses the pathology findings and reviews the nature of locally advanced breast disease. She has done well since surgery. She will be proceeding next with chemotherapy to reduce risks of metastatic disease. Dr. Dewey recommends external radiotherapy to the breast  to reduce risks of local recurrence about a month after completing chemotherapy. Dr. Lanny also plans for adjuvant antiestrogen therapy to follow. We discussed the risks, benefits, short, and long term effects of radiotherapy, as well as the curative intent, and the patient is interested in proceeding. Dr. Dewey discusses the delivery and logistics of radiotherapy and anticipates a course of 6 1/2 weeks of radiotherapy to the left breast and regional nodes with deep inspiration breath hold technique. We will see her back around the time she is finishing  chemotherapy.  2. Transportation [place]. Given her dementia she does not drive. She navigates her home well, but we will coordinate transportation options with our cancer center transportation team  so she has more resources to attend appointments her family may not be able to bring her to.   This encounter was provided by telemedicine platform MyChart.  The patient has provided two factor identification and has given verbal consent for this type of encounter and has been advised to only accept a meeting of this type in a secure network environment. The time spent during this encounter was 60 minutes including preparation, discussion, and coordination of the patient's care. The attendants for this meeting include Sharene Cary, RN, Dr. Dewey, Donald Estefana Husband  and Lolita LITTIE Mae and her daughter Tully Burgo. During the encounter,  Sharene Cary, RN, Dr. Dewey, and Donald Estefana Husband were located at [facility] Radiation Oncology Department.  MAEOLA MCHANEY was located at home with her daughter Kamyia Thomason.    The above documentation reflects my direct findings during this shared patient visit. Please see the separate note by Dr. Dewey on this date for the remainder of the patient's plan of care.    Donald KYM Husband, [facility]    **Disclaimer: This note was dictated with voice recognition software. Similar sounding words can inadvertently be transcribed and this note may contain transcription errors which may not have been corrected upon publication of note.**

## 2023-10-23 NOTE — Telephone Encounter (Signed)
 Personal Care services form found in RN box on 1/6.  Called daughter. She reports she would like to come and pick this up.   Form placed up front for pick up.  Copy made for batch scanning.

## 2023-10-25 ENCOUNTER — Other Ambulatory Visit: Payer: Self-pay | Admitting: Student

## 2023-10-25 DIAGNOSIS — M792 Neuralgia and neuritis, unspecified: Secondary | ICD-10-CM

## 2023-10-29 ENCOUNTER — Telehealth: Payer: Self-pay

## 2023-10-29 NOTE — Telephone Encounter (Signed)
 Patient calls nurse line regarding questions with periodic diarrhea.   She reports for the last month she will have intermittent episodes of diarrhea that will last for about one day. She denies abdominal pain, nausea or vomiting.   She reports that she is eating and drinking normally.   She is asking if any medications that she is being prescribed could cause this GI upset.   Will forward to PCP for further advisement.  Chiquita JAYSON English, RN

## 2023-10-31 ENCOUNTER — Ambulatory Visit (HOSPITAL_COMMUNITY)
Admission: RE | Admit: 2023-10-31 | Discharge: 2023-10-31 | Disposition: A | Discharge status: Home / Self Care | Payer: 59 | Source: Ambulatory Visit | Attending: Hematology | Admitting: Hematology

## 2023-10-31 ENCOUNTER — Encounter (HOSPITAL_COMMUNITY)
Admission: RE | Admit: 2023-10-31 | Discharge: 2023-10-31 | Disposition: A | Discharge status: Home / Self Care | Payer: 59 | Source: Ambulatory Visit | Attending: Hematology | Admitting: Hematology

## 2023-10-31 DIAGNOSIS — R590 Localized enlarged lymph nodes: Secondary | ICD-10-CM | POA: Diagnosis not present

## 2023-10-31 DIAGNOSIS — N281 Cyst of kidney, acquired: Secondary | ICD-10-CM | POA: Diagnosis not present

## 2023-10-31 DIAGNOSIS — C50412 Malignant neoplasm of upper-outer quadrant of left female breast: Secondary | ICD-10-CM | POA: Insufficient documentation

## 2023-10-31 DIAGNOSIS — N200 Calculus of kidney: Secondary | ICD-10-CM | POA: Diagnosis not present

## 2023-10-31 DIAGNOSIS — Z170 Estrogen receptor positive status [ER+]: Secondary | ICD-10-CM | POA: Insufficient documentation

## 2023-10-31 DIAGNOSIS — C50912 Malignant neoplasm of unspecified site of left female breast: Secondary | ICD-10-CM | POA: Diagnosis not present

## 2023-10-31 MED ORDER — TECHNETIUM TC 99M MEDRONATE IV KIT
20.0000 | PACK | Freq: Once | INTRAVENOUS | Status: AC
  Administered 2023-10-31: 21.4 via INTRAVENOUS

## 2023-10-31 MED ORDER — IOHEXOL 300 MG/ML  SOLN
100.0000 mL | Freq: Once | INTRAMUSCULAR | Status: AC | PRN
  Administered 2023-10-31: 100 mL via INTRAVENOUS

## 2023-11-01 ENCOUNTER — Encounter: Payer: Self-pay | Admitting: *Deleted

## 2023-11-01 NOTE — Assessment & Plan Note (Signed)
-  pT3N3aMx, at least stage IIIA, ER+/PR+/HER2- -patient presented with a palpable left breast mass, ultrasound showed multiple enlarged left axillary lymph node. -Due to her underlying dementia, she underwent upfront surgery.  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 -Recommend CT and bone scan to rule out distant metastasis -I discussed the high risk of recurrence due to large number of positive lymph node, and the benefit of adjuvant chemotherapy, followed by adjuvant radiation, aromatase inhibitor and CDK4/6 inhibitor  --Chemotherapy consent: Side effects including but does not not limited to, fatigue, nausea, vomiting, diarrhea, hair loss, neuropathy, fluid retention, renal and kidney dysfunction, neutropenic fever, needed for blood transfusion, bleeding, cognitive deterioration from chemotherapy, were discussed with patient in great detail. She agrees to proceed. -If her restaging CT scan or bone scan shows distant metastasis, then I recommend first-line aromatase inhibitor and CDK 4/6 inhibitor

## 2023-11-02 ENCOUNTER — Inpatient Hospital Stay (HOSPITAL_BASED_OUTPATIENT_CLINIC_OR_DEPARTMENT_OTHER): Payer: 59 | Admitting: Hematology

## 2023-11-02 ENCOUNTER — Other Ambulatory Visit: Payer: Self-pay

## 2023-11-02 ENCOUNTER — Inpatient Hospital Stay: Payer: 59

## 2023-11-02 ENCOUNTER — Encounter: Payer: Self-pay | Admitting: Hematology

## 2023-11-02 ENCOUNTER — Other Ambulatory Visit: Payer: Self-pay | Admitting: Radiology

## 2023-11-02 VITALS — BP 134/82 | HR 63 | Temp 98.7°F | Resp 17 | Wt 189.2 lb

## 2023-11-02 DIAGNOSIS — C50412 Malignant neoplasm of upper-outer quadrant of left female breast: Secondary | ICD-10-CM

## 2023-11-02 DIAGNOSIS — M8580 Other specified disorders of bone density and structure, unspecified site: Secondary | ICD-10-CM | POA: Diagnosis not present

## 2023-11-02 DIAGNOSIS — Z1732 Human epidermal growth factor receptor 2 negative status: Secondary | ICD-10-CM | POA: Diagnosis not present

## 2023-11-02 DIAGNOSIS — C50912 Malignant neoplasm of unspecified site of left female breast: Secondary | ICD-10-CM

## 2023-11-02 DIAGNOSIS — R110 Nausea: Secondary | ICD-10-CM | POA: Diagnosis not present

## 2023-11-02 DIAGNOSIS — Z170 Estrogen receptor positive status [ER+]: Secondary | ICD-10-CM | POA: Diagnosis not present

## 2023-11-02 DIAGNOSIS — Z5189 Encounter for other specified aftercare: Secondary | ICD-10-CM | POA: Diagnosis not present

## 2023-11-02 DIAGNOSIS — Z5111 Encounter for antineoplastic chemotherapy: Secondary | ICD-10-CM | POA: Diagnosis not present

## 2023-11-02 DIAGNOSIS — I10 Essential (primary) hypertension: Secondary | ICD-10-CM | POA: Diagnosis not present

## 2023-11-02 DIAGNOSIS — C773 Secondary and unspecified malignant neoplasm of axilla and upper limb lymph nodes: Secondary | ICD-10-CM | POA: Diagnosis not present

## 2023-11-02 DIAGNOSIS — Z1721 Progesterone receptor positive status: Secondary | ICD-10-CM | POA: Diagnosis not present

## 2023-11-02 DIAGNOSIS — Z79899 Other long term (current) drug therapy: Secondary | ICD-10-CM | POA: Diagnosis not present

## 2023-11-02 MED ORDER — ONDANSETRON HCL 8 MG PO TABS: ORAL_TABLET | ORAL | 1 refills | Status: DC

## 2023-11-02 MED ORDER — LIDOCAINE-PRILOCAINE 2.5-2.5 % EX CREA: TOPICAL_CREAM | CUTANEOUS | 3 refills | Status: DC

## 2023-11-02 MED ORDER — PROCHLORPERAZINE MALEATE 10 MG PO TABS: 10.0000 mg | ORAL_TABLET | Freq: Four times a day (QID) | ORAL | 1 refills | Status: DC | PRN

## 2023-11-02 NOTE — Progress Notes (Signed)
START ON PATHWAY REGIMEN - Breast     Cycles 1 through 4: A cycle is every 14 days:     Doxorubicin      Cyclophosphamide      Pegfilgrastim-xxxx    Cycles 5 through 16: A cycle is every 7 days:     Paclitaxel   **Always confirm dose/schedule in your pharmacy ordering system**  Patient Characteristics: Postoperative without Neoadjuvant Therapy, M0 (Pathologic Staging), Invasive Disease, Adjuvant Therapy, HER2 Negative, ER Positive, Node Positive, Node Positive (4+) Therapeutic Status: Postoperative without Neoadjuvant Therapy, M0 (Pathologic Staging) AJCC Grade: G2 AJCC N Category: pN3a AJCC M Category: cM0 ER Status: Positive (+) AJCC 8 Stage Grouping: IIIA HER2 Status: Negative (-) Oncotype Dx Recurrence Score: Not Appropriate AJCC T Category: pT3 PR Status: Positive (+) Intent of Therapy: Curative Intent, Discussed with Patient

## 2023-11-02 NOTE — Progress Notes (Signed)
[facility]   [phone] [phone]   Clinic Follow up Note   Patient Care Team: Erick Alley, DO as PCP - General (Family Medicine) Runell Gess, MD as PCP - Cardiology (Cardiology) York Spaniel, MD (Inactive) as Consulting Physician (Neurology) Malachy Mood, MD as Consulting Physician (Hematology and Oncology)  Date of Service:  11/02/2023  CHIEF COMPLAINT: f/u of breast cancer   CURRENT THERAPY:  Pending adjuvant chemo AC-T  Oncology History   Malignant neoplasm of upper-outer quadrant of left breast in female, estrogen receptor positive (HCC) -pT3N3aMx, at least stage IIIA, ER+/PR+/HER2- -patient presented with a palpable left breast mass, ultrasound showed multiple enlarged left axillary lymph node. -Due to her underlying dementia, she underwent upfront surgery.  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 -Recommend CT and bone scan to rule out distant metastasis -I discussed the high risk of recurrence due to large number of positive lymph node, and the benefit of adjuvant chemotherapy, followed by adjuvant radiation, aromatase inhibitor and CDK4/6 inhibitor  --Chemotherapy consent: Side effects including but does not not limited to, fatigue, nausea, vomiting, diarrhea, hair loss, neuropathy, fluid retention, renal and kidney dysfunction, neutropenic fever, needed for blood transfusion, bleeding, cognitive deterioration from chemotherapy, were discussed with patient in great detail. She agrees to proceed. -If her restaging CT scan or bone scan shows distant metastasis, then I recommend first-line aromatase inhibitor and CDK 4/6 inhibitor   Assessment and Plan    Breast Cancer 68 year old with breast cancer, post-surgery on October 04, 2023. Experiencing tolerable incision site pain. CT shows a borderline enlarged lymph node (1 cm) in the left axilla, likely reactive post-surgery but residual cancer  cannot be ruled out. Bone scan pending. Chemotherapy planned with concerns about potential memory worsening. Discussed risks of Adriamycin including cardiotoxicity, and the need for echocardiogram before starting chemotherapy. Informed consent obtained for chemotherapy, including discussion of risks (e.g., cardiotoxicity, cognitive issues), benefits (systemic treatment to eradicate microscopic disease), and alternatives (surgery, radiation). --Chemotherapy consent: Side effects including but does not not limited to, fatigue, nausea, vomiting, diarrhea, hair loss, neuropathy, fluid retention, renal and kidney dysfunction, neutropenic fever, needed for blood transfusion, bleeding, were discussed with patient in great detail. She agrees to proceed. -The goal of chemotherapy is curative - Consult Dr. Magnus Ivan and Dr. Mitzi Hansen regarding lymph node and potential biopsy or surgery - Schedule echocardiogram before chemotherapy - Administer chemotherapy regimen: Adriamycin and Cytoxan biweekly for four cycles, followed by weekly Taxol for twelve weeks - Schedule port placement on Monday - Schedule chemotherapy class - Administer post-chemotherapy injection to boost immune system - Prescribe antiemetics and lidocaine cream - Follow-up with radiation therapy post-chemotherapy - Administer anti-estrogen therapy post-radiation  Nausea Intermittent nausea, possibly due to inconsistent medication adherence. Recent episode without breakfast intake. - Ensure consistent medication adherence - Prescribe antiemetics  Hypertension Elevated blood pressure, recent episode of 202/120 mmHg during EMS call, normalized by hospital arrival. No further episodes reported. - Monitor blood pressure regularly - Ensure consistent medication adherence  Stroke-like Symptoms Recent episode of left-sided heaviness and weakness, resolved by hospital arrival. Normal CT scan. Symptoms possibly related to anxiety or mental stress. -  Monitor for recurrence of symptoms - Consider neurology follow-up if symptoms recur  Plan -port placement next Monday, will schedule chemo class and echo next week  -Plan to start adjuvant chemotherapy Adriamycin and Cytoxan in 1 to 2 weeks     SUMMARY OF ONCOLOGIC HISTORY: Oncology History  Infiltrating  ductal carcinoma of breast (HCC)  09/22/2023 Initial Diagnosis   Infiltrating ductal carcinoma of breast (HCC)   11/14/2023 -  Chemotherapy   Patient is on Treatment Plan : BREAST DOSE DENSE AC q14d / PACLitaxel q7d     Malignant neoplasm of upper-outer quadrant of left breast in female, estrogen receptor positive (HCC)  09/28/2023 Initial Diagnosis   Malignant neoplasm of upper-outer quadrant of left breast in female, estrogen receptor positive (HCC)   09/28/2023 Cancer Staging   Staging form: Breast, AJCC 8th Edition - Clinical stage from 09/28/2023: Stage IIA (cT2, cN1, cM0, G2, ER+, PR+, HER2-) - Signed by Malachy Mood, MD on 09/28/2023 Histologic grading system: 3 grade system   10/04/2023 Cancer Staging   Staging form: Breast, AJCC 8th Edition - Pathologic stage from 10/04/2023: Stage IIIA (pT2, pN3a, cM0, G2, ER+, PR+, HER2-) - Signed by Malachy Mood, MD on 10/19/2023 Histologic grading system: 3 grade system Residual tumor (R): R0 - None      Discussed the use of AI scribe software for clinical note transcription with the patient, who gave verbal consent to proceed.  History of Present Illness   The patient, a 68 year old female with a history of breast cancer, presents for a follow-up visit after recent surgery. The patient reports occasional shooting pain at the incision site, which is tolerable and does not require analgesics.  Two weeks ago, the patient had an emergency room visit due to sudden onset of heaviness in the left side of the body and feeling unwell. The patient's blood pressure was elevated at 202/120 mmHg at the time of the event. A CT scan was performed,  which was unremarkable. The patient reports that the symptoms resolved on her own.  The patient also reports intermittent nausea for the past couple of weeks. The patient did not eat breakfast on the day of the visit and had only one pineapple. The patient's family member suggests that the patient's mental state, including grief over the loss of her husband, may be affecting her physical health.  The patient and her family have agreed to proceed with chemotherapy, despite concerns about potential worsening of the patient's memory.         All other systems were reviewed with the patient and are negative.  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   Hyperlipidemia    Hyperparathyroidism (HCC) 07/25/2019   Hypertension    Normal cardiac stress test    Myoview stress test   OAB (overactive bladder) 09/01/2015   Osteopenia 05/2018   T score -1.1 FRAX 2.5%/ 0.1%   Subclinical hyperthyroidism 05/08/2007   Qualifier: Diagnosis of  By: Jimmey Ralph MD, Victorino Dike      SURGICAL HISTORY: Past Surgical History:  Procedure Laterality Date   BREAST BIOPSY Left 09/19/2023   x's 2   BREAST BIOPSY Left 09/19/2023   [place] LT BREAST BX W LOC DEV 1ST LESION IMG BX SPEC US GUIDE 09/19/2023 [facility]   BREAST BIOPSY  Left 10/03/2023   [place] LT RADIOACTIVE SEED LOC 10/03/2023 [facility]   BREAST BIOPSY Left 10/03/2023   [place] LT RADIOACTIVE SEED EA ADD LESION 10/03/2023 [facility]   BREAST LUMPECTOMY WITH RADIOACTIVE SEED AND AXILLARY LYMPH NODE DISSECTION Left 10/04/2023   Procedure: LEFT  BREAST LUMPECTOMY AND TARGETED LYMPH NODE DISSECTION;  Surgeon: Abigail Miyamoto, MD;  Location: [facility];  Service: General;  Laterality: Left;   COLONOSCOPY     POLYPECTOMY     TOTAL HIP ARTHROPLASTY Left 10/01/2013   DR Turner Daniels   TOTAL HIP ARTHROPLASTY Left 10/01/2013   Procedure: TOTAL HIP ARTHROPLASTY;  Surgeon: [name], MD;  Location: [facility] OR;  Service: Orthopedics;  Laterality: Left;    I have reviewed the social history and family history with the patient and they are unchanged from previous note.  ALLERGIES:  is allergic to hydrochlorothiazide, hydrocodone, and simvastatin.  MEDICATIONS:  Current Outpatient Medications  Medication Sig Dispense Refill   albuterol (VENTOLIN HFA) 108 (90 Base) MCG/ACT inhaler USE 2 INHALATIONS BY MOUTH EVERY 6 HOURS AS NEEDED FOR WHEEZING  OR SHORTNESS OF BREATH (Patient taking differently: Inhale 2 puffs into the lungs every 6 (six) hours as needed for wheezing or shortness of breath.) 26.8 g 2   amLODipine (NORVASC) 10 MG tablet TAKE 1 TABLET BY MOUTH EVERY  MORNING 100 tablet 2   Budesonide 90 MCG/ACT inhaler Inhale 1 puff into the lungs 2 (two) times daily. (Patient not taking: Reported on 10/20/2023) 1 each 0   capsaicin (ZOSTRIX) 0.025 % cream Apply topically 2 (two) times daily. (Patient not taking: Reported on 10/20/2023) 60 g 0   cholecalciferol (VITAMIN D3) 25 MCG (1000 UNIT) tablet Take 1 tablet (1,000 Units total) by mouth daily. (Patient not taking: Reported on 10/20/2023) 7 tablet 0   Cholecalciferol (VITAMIN D3) 50 MCG (2000 UT) CHEW Chew 2,000 Units by mouth daily.     donepezil (ARICEPT) 10 MG tablet Take 10 mg by mouth at bedtime.     fluticasone (FLONASE) 50 MCG/ACT nasal spray USE 1 SPRAY IN BOTH NOSTRILS  DAILY (Patient taking differently: Place 1 spray into both nostrils daily as needed for allergies or rhinitis.) 16 g 0   fluticasone (FLOVENT HFA) 110 MCG/ACT inhaler Inhale 1 puff into the lungs daily as needed.  (Patient taking differently: Inhale 1 puff into the lungs daily as needed ("for flares").) 1 each 12   gabapentin (NEURONTIN) 100 MG capsule TAKE 1 CAPSULE BY MOUTH DAILY 90 capsule 3   lidocaine-prilocaine (EMLA) cream Apply to affected area once 30 g 3   loratadine (EQ ALLERGY RELIEF) 10 MG tablet Take 1 tablet (10 mg total) by mouth daily. (Patient taking differently: Take 10 mg by mouth daily as needed for allergies or rhinitis.) 90 tablet 3   losartan (COZAAR) 25 MG tablet Take 50 mg by mouth at bedtime.     losartan (COZAAR) 50 MG tablet Take 1 tablet (50 mg total) by mouth at bedtime. (Patient not taking: Reported on 10/20/2023) 90 tablet 3   meloxicam (MOBIC) 15 MG tablet Take 1 tablet by mouth once daily (Patient taking differently: Take 15 mg by mouth daily as needed for pain.) 30 tablet 0   metoprolol tartrate (LOPRESSOR) 50 MG tablet TAKE 1 TABLET BY MOUTH TWICE  DAILY (Patient taking differently: Take 50 mg by mouth in the morning and at bedtime.) 200 tablet 2   ondansetron (ZOFRAN) 8 MG tablet Take 1 tab (8 mg) by mouth every 8  hrs as needed for nausea/vomiting. Start third day after doxorubicin/cyclophosphamide chemotherapy. 30 tablet 1   ondansetron (ZOFRAN-ODT) 4 MG disintegrating tablet Take 4 mg by mouth every 8 (eight) hours as needed for nausea or vomiting (dissolve orally).     prochlorperazine (COMPAZINE) 10 MG tablet Take 1 tablet (10 mg total) by mouth every 6 (six) hours as needed for nausea or vomiting. 30 tablet 1   rosuvastatin (CRESTOR) 5 MG tablet Take one tablet by mouth every Monday and Friday. (Patient taking differently: Take 5 mg by mouth See admin instructions. Take 5 mg by mouth every Monday and Friday) 20 tablet 4   sertraline (ZOLOFT) 50 MG tablet Take 1 tablet (50 mg total) by mouth daily. 30 tablet 0   traMADol (ULTRAM) 50 MG tablet Take 1 tablet (50 mg total) by mouth every 6 (six) hours as needed for moderate pain (pain score 4-6) or severe pain (pain score  7-10). 25 tablet 0   TYLENOL 500 MG tablet Take 500-1,000 mg by mouth every 6 (six) hours as needed for mild pain (pain score 1-3) or headache.     No current facility-administered medications for this visit.    PHYSICAL EXAMINATION: ECOG PERFORMANCE STATUS: 0 - Asymptomatic  Vitals:   11/02/23 0844  BP: 134/82  Pulse: 63  Resp: 17  Temp: 98.7 F (37.1 C)  SpO2: 98%   Wt Readings from Last 3 Encounters:  11/02/23 189 lb 3.2 oz (85.8 kg)  10/23/23 197 lb (89.4 kg)  10/20/23 197 lb 1.5 oz (89.4 kg)     GENERAL:alert, no distress and comfortable SKIN: skin color, texture, turgor are normal, no rashes or significant lesions EYES: normal, Conjunctiva are pink and non-injected, sclera clear NECK: supple, thyroid normal size, non-tender, without nodularity LYMPH:  no palpable lymphadenopathy in the cervical, axillary  LUNGS: clear to auscultation and percussion with normal breathing effort HEART: regular rate & rhythm and no murmurs and no lower extremity edema ABDOMEN:abdomen soft, non-tender and normal bowel sounds Musculoskeletal:no cyanosis of digits and no clubbing  NEURO: alert & oriented x 3 with fluent speech, no focal motor/sensory deficits   LABORATORY DATA:  I have reviewed the data as listed    Latest Ref Rng & Units 10/20/2023   12:04 PM 10/20/2023   11:59 AM 08/25/2021   10:25 AM  CBC  WBC 4.0 - 10.5 K/uL  8.6  6.8   Hemoglobin 12.0 - 15.0 g/dL 16.1  09.6  04.5   Hematocrit 36.0 - 46.0 % 38.0  39.5  36.3   Platelets 150 - 400 K/uL  306  306         Latest Ref Rng & Units 10/20/2023   12:04 PM 10/20/2023   11:59 AM 12/29/2021    9:53 AM  CMP  Glucose 70 - 99 mg/dL 409  811  92   BUN 8 - 23 mg/dL 5  7  7    Creatinine 0.44 - 1.00 mg/dL 9.14  7.82  9.56   Sodium 135 - 145 mmol/L 143  142  141   Potassium 3.5 - 5.1 mmol/L 3.8  3.9  4.1   Chloride 98 - 111 mmol/L 108  108  106   CO2 22 - 32 mmol/L  24  24   Calcium 8.9 - 10.3 mg/dL  21.3  08.6   Total Protein  6.5 - 8.1 g/dL  7.1    Total Bilirubin 0.0 - 1.2 mg/dL  0.5    Alkaline Phos 38 - 126  U/L  118    AST 15 - 41 U/L  19    ALT 0 - 44 U/L  17        RADIOGRAPHIC STUDIES: I have personally reviewed the radiological images as listed and agreed with the findings in the report. NM Bone Scan Whole Body Result Date: 11/02/2023 CLINICAL DATA:  LEFT breast carcinoma. EXAM: NUCLEAR MEDICINE WHOLE BODY BONE SCAN TECHNIQUE: Whole body anterior and posterior images were obtained approximately 3 hours after intravenous injection of radiopharmaceutical. RADIOPHARMACEUTICALS:  Twenty-one.  [facility] Technetium-47m MDP IV COMPARISON:  CT 10/31/2023 FINDINGS: No radiotracer accumulation within the axillary or appendicular skeleton to suggest skeletal metastasis. Radiotracer uptake in the midthoracic spine course of the spine see osteophytosis on comparison CT. IMPRESSION: No evidence skeletal metastasis. Electronically Signed   By: Genevive Bi M.D.   On: 11/02/2023 09:03   CT CHEST ABDOMEN PELVIS W CONTRAST Result Date: 10/31/2023 CLINICAL DATA:  Left breast cancer initial workup * Tracking Code: BO * EXAM: CT CHEST, ABDOMEN, AND PELVIS WITH CONTRAST TECHNIQUE: Multidetector CT imaging of the chest, abdomen and pelvis was performed following the standard protocol during bolus administration of intravenous contrast. RADIATION DOSE REDUCTION: This exam was performed according to the departmental dose-optimization program which includes automated exposure control, adjustment of the mA and/or kV according to patient size and/or use of iterative reconstruction technique. CONTRAST:  OMNIPAQUE IOHEXOL 300 MG/ML  SOLN COMPARISON:  None Available. FINDINGS: CT CHEST FINDINGS Cardiovascular: No significant vascular findings. Normal heart size. No pericardial effusion. Mediastinum/Nodes: Enlarged left axillary lymph node measuring 1.0 x 0.8 cm (series 2, image 15). No other enlarged mediastinal, hilar, or axillary lymph  nodes. Thyroid gland, trachea, and esophagus demonstrate no significant findings. Lungs/Pleura: Lungs are clear. No pleural effusion or pneumothorax. Musculoskeletal: Status post lumpectomy of the upper outer left breast and left axillary lymph node dissection with associated simple attenuation fluid collections (series 2, image 25, 21). No acute osseous findings. CT ABDOMEN PELVIS FINDINGS Hepatobiliary: No solid liver abnormality is seen. No gallstones, gallbladder wall thickening, or biliary dilatation. Pancreas: Unremarkable. No pancreatic ductal dilatation or surrounding inflammatory changes. Spleen: Normal in size without significant abnormality. Adrenals/Urinary Tract: Adrenal glands are unremarkable. Multiple small bilateral nonobstructive renal calculi. Benign left parapelvic and renal cortical cysts, for which no further follow-up or characterization is required. No hydronephrosis. Bladder is unremarkable. Stomach/Bowel: Stomach is within normal limits. Appendix appears normal. No evidence of bowel wall thickening, distention, or inflammatory changes. Vascular/Lymphatic: No significant vascular findings are present. No enlarged abdominal or pelvic lymph nodes. Reproductive: No mass or other abnormality. Other: No abdominal wall hernia or abnormality. No ascites. Musculoskeletal: No acute osseous findings. Status post left hip total arthroplasty. IMPRESSION: 1. Status post lumpectomy of the upper outer left breast and left axillary lymph node dissection with associated simple attenuation fluid collections, most consistent with postoperative seromas. 2. Enlarged left axillary lymph node measuring 1.0 x 0.8 cm, nonspecific and possibly reactive in the setting of recent surgery. Correlate with tissue findings of lymph node dissection. Attention on follow-up. 3. No other evidence of lymphadenopathy or metastatic disease in the chest, abdomen, or pelvis. 4. Nonobstructive bilateral nephrolithiasis.  Electronically Signed   By: Jearld Lesch M.D.   On: 10/31/2023 12:53      Orders Placed This Encounter  Procedures   Consent Attestation for Oncology Treatment    The patient is informed of risks, benefits, side-effects of the prescribed oncology treatment. Potential short term and long term side  effects and response rates discussed. After a long discussion, the patient made informed decision to proceed.:   Yes   CBC with Differential (Cancer Center Only)    Standing Status:   Future    Expected Date:   11/14/2023    Expiration Date:   11/13/2024   CMP (Cancer Center only)    Standing Status:   Future    Expected Date:   11/14/2023    Expiration Date:   11/13/2024   CBC with Differential (Cancer Center Only)    Standing Status:   Future    Expected Date:   11/28/2023    Expiration Date:   11/27/2024   CMP (Cancer Center only)    Standing Status:   Future    Expected Date:   11/28/2023    Expiration Date:   11/27/2024   CBC with Differential (Cancer Center Only)    Standing Status:   Future    Expected Date:   12/12/2023    Expiration Date:   12/11/2024   CMP (Cancer Center only)    Standing Status:   Future    Expected Date:   12/12/2023    Expiration Date:   12/11/2024   CBC with Differential (Cancer Center Only)    Standing Status:   Future    Expected Date:   12/26/2023    Expiration Date:   12/25/2024   CMP (Cancer Center only)    Standing Status:   Future    Expected Date:   12/26/2023    Expiration Date:   12/25/2024   PHYSICIAN COMMUNICATION ORDER    A baseline Echo/ [place] should be obtained prior to initiation of Anthracycline Chemotherapy   ECHOCARDIOGRAM COMPLETE    Standing Status:   Future    Expected Date:   11/07/2023    Expiration Date:   11/01/2024    Where should this test be performed:   [place]    Perflutren DEFINITY (image enhancing agent) should be administered unless hypersensitivity or allergy exist:   Administer Perflutren    Reason for exam-Echo:   Chemo   Z09   All questions were answered. The patient knows to call the clinic with any problems, questions or concerns. No barriers to learning was detected. The total time spent in the appointment was 40 minutes.     Malachy Mood, MD 11/02/2023

## 2023-11-04 ENCOUNTER — Other Ambulatory Visit: Payer: Self-pay

## 2023-11-05 ENCOUNTER — Other Ambulatory Visit: Payer: Self-pay | Admitting: Hematology

## 2023-11-05 ENCOUNTER — Other Ambulatory Visit: Payer: Self-pay

## 2023-11-05 ENCOUNTER — Other Ambulatory Visit: Payer: Self-pay | Admitting: Radiology

## 2023-11-05 ENCOUNTER — Encounter: Payer: Self-pay | Admitting: *Deleted

## 2023-11-05 ENCOUNTER — Ambulatory Visit (HOSPITAL_COMMUNITY)
Admission: RE | Admit: 2023-11-05 | Discharge: 2023-11-05 | Disposition: A | Discharge status: Home / Self Care | Payer: 59 | Source: Ambulatory Visit | Attending: Hematology | Admitting: Hematology

## 2023-11-05 DIAGNOSIS — C50912 Malignant neoplasm of unspecified site of left female breast: Secondary | ICD-10-CM | POA: Diagnosis not present

## 2023-11-05 DIAGNOSIS — C50412 Malignant neoplasm of upper-outer quadrant of left female breast: Secondary | ICD-10-CM

## 2023-11-05 DIAGNOSIS — Z170 Estrogen receptor positive status [ER+]: Secondary | ICD-10-CM | POA: Diagnosis not present

## 2023-11-05 DIAGNOSIS — E785 Hyperlipidemia, unspecified: Secondary | ICD-10-CM | POA: Diagnosis not present

## 2023-11-05 DIAGNOSIS — I10 Essential (primary) hypertension: Secondary | ICD-10-CM | POA: Insufficient documentation

## 2023-11-05 DIAGNOSIS — R110 Nausea: Secondary | ICD-10-CM | POA: Diagnosis not present

## 2023-11-05 DIAGNOSIS — F419 Anxiety disorder, unspecified: Secondary | ICD-10-CM | POA: Diagnosis not present

## 2023-11-05 DIAGNOSIS — F32A Depression, unspecified: Secondary | ICD-10-CM | POA: Diagnosis not present

## 2023-11-05 HISTORY — PX: IR IMAGING GUIDED PORT INSERTION: IMG5740

## 2023-11-05 LAB — MRSA NEXT GEN BY PCR, NASAL: MRSA by PCR Next Gen: NOT DETECTED

## 2023-11-05 MED ORDER — FENTANYL CITRATE (PF) 100 MCG/2ML IJ SOLN
INTRAMUSCULAR | Status: AC
  Filled 2023-11-05: qty 2

## 2023-11-05 MED ORDER — SODIUM CHLORIDE 0.9 % IV SOLN: INTRAVENOUS | Status: DC

## 2023-11-05 MED ORDER — MIDAZOLAM HCL 2 MG/2ML IJ SOLN
INTRAMUSCULAR | Status: AC
  Filled 2023-11-05: qty 2

## 2023-11-05 MED ORDER — LIDOCAINE-EPINEPHRINE 1 %-1:100000 IJ SOLN
INTRAMUSCULAR | Status: AC
  Filled 2023-11-05: qty 1

## 2023-11-05 MED ORDER — HEPARIN SOD (PORK) LOCK FLUSH 100 UNIT/ML IV SOLN
INTRAVENOUS | Status: AC
  Filled 2023-11-05: qty 5

## 2023-11-05 MED ORDER — LIDOCAINE-EPINEPHRINE 1 %-1:100000 IJ SOLN
20.0000 mL | Freq: Once | INTRAMUSCULAR | Status: AC
  Administered 2023-11-05: 20 mL via INTRADERMAL

## 2023-11-05 MED ORDER — FENTANYL CITRATE (PF) 100 MCG/2ML IJ SOLN
INTRAMUSCULAR | Status: AC | PRN
  Administered 2023-11-05: 50 ug via INTRAVENOUS
  Administered 2023-11-05 (×2): 25 ug via INTRAVENOUS

## 2023-11-05 MED ORDER — MIDAZOLAM HCL 2 MG/2ML IJ SOLN
INTRAMUSCULAR | Status: AC | PRN
  Administered 2023-11-05: .5 mg via INTRAVENOUS
  Administered 2023-11-05: 1 mg via INTRAVENOUS

## 2023-11-05 MED ORDER — HEPARIN SOD (PORK) LOCK FLUSH 100 UNIT/ML IV SOLN
500.0000 [IU] | Freq: Once | INTRAVENOUS | Status: AC
  Administered 2023-11-05: 500 [IU] via INTRAVENOUS

## 2023-11-05 NOTE — H&P (Signed)
Chief Complaint: Patient was seen in consultation today for Stage III left breast cancer pending adjuvant chemotherapy  Procedure: Portacath placement   Referring Physician(s): Feng,Yan  Supervising Physician: Irish Lack  Patient Status: [facility] - Out-pt  History of Present Illness: [name] is a 68 y.o. female with a history HTN, HLD, arthritis, depression, anxiety, and a recent diagnosis of stage III ER+/PR+/HER2- left breast cancer presenting for a port placement. She is currently following with Dr. Malachy Mood. She underwent upfront left breast lumpectomy with target lymph node dissection on 10/04/2023. Unfortunately, pathology was positive for all 14 lymph nodes with negative margins. Patient and provider were both in agreement to start adjuvant chemotherapy followed by radiation. IR consulted for portacath placement for chemotherapy treatments.   Patient lying comfortably in bed with son at bedside. States that she has been experiencing some mild nausea, but denies any vomiting, diarrhea, or abdominal pain. She has no other complaints at this time. NPO since midnight.   Code Status: Full code  Past Medical History:  Diagnosis Date   Allergy    Anxiety    Arthritis    degenerative in back and hips   Asthma    Atypical chest pain 05/03/2015   Low risk Myoview 2016, normal LVF by echo Dec 2020   Breast cancer (HCC) 10/04/2023   Carpal tunnel syndrome, bilateral    Chest pain    Depression    Elevated PTHrP level 06/19/2018   Syncope   Feeling grief 12/30/2015   Heart murmur    age 68 said had heart murmur.   Hyperlipidemia    Hyperparathyroidism (HCC) 07/25/2019   Hypertension    Normal cardiac stress test    Myoview stress test   OAB (overactive bladder) 09/01/2015   Osteopenia 05/2018   T score -1.1 FRAX 2.5%/ 0.1%   Subclinical hyperthyroidism 05/08/2007   Qualifier: Diagnosis of  By: Jimmey Ralph MD, Victorino Dike      Past Surgical History:  Procedure  Laterality Date   BREAST BIOPSY Left 09/19/2023   x's 2   BREAST BIOPSY Left 09/19/2023   [place] LT BREAST BX W LOC DEV 1ST LESION IMG BX SPEC US GUIDE 09/19/2023 [facility]   BREAST BIOPSY Left 10/03/2023   [place] LT RADIOACTIVE SEED LOC 10/03/2023 [facility]   BREAST BIOPSY Left 10/03/2023   [place] LT RADIOACTIVE SEED EA ADD LESION 10/03/2023 [facility]   BREAST LUMPECTOMY WITH RADIOACTIVE SEED AND AXILLARY LYMPH NODE DISSECTION Left 10/04/2023   Procedure: LEFT BREAST LUMPECTOMY AND TARGETED LYMPH NODE DISSECTION;  Surgeon: Abigail Miyamoto, MD;  Location: [facility];  Service: General;  Laterality: Left;   COLONOSCOPY     POLYPECTOMY     TOTAL HIP ARTHROPLASTY Left 10/01/2013   DR Turner Daniels   TOTAL HIP ARTHROPLASTY Left 10/01/2013   Procedure: TOTAL HIP ARTHROPLASTY;  Surgeon: [name], MD;  Location: [facility] OR;  Service: Orthopedics;  Laterality: Left;    Allergies: Hydrochlorothiazide, Hydrocodone, and Simvastatin  Medications: Prior to Admission medications   Medication Sig Start Date End Date Taking? Authorizing Provider  albuterol (VENTOLIN HFA) 108 (90 Base) MCG/ACT inhaler USE 2 INHALATIONS BY MOUTH EVERY 6 HOURS AS NEEDED FOR WHEEZING  OR SHORTNESS OF BREATH Patient taking differently: Inhale 2 puffs into the lungs every 6 (six) hours as needed for wheezing or shortness of breath. 08/29/23  Yes Erick Alley, DO  amLODipine (NORVASC) 10 MG tablet TAKE 1 TABLET BY MOUTH EVERY  MORNING 01/23/23  Yes Melba Coon,  Alejandra, DO  aspirin EC 81 MG tablet Take 81 mg by mouth daily. Swallow whole.   Yes [provider]  gabapentin (NEURONTIN) 100 MG capsule TAKE 1 CAPSULE BY MOUTH DAILY 10/25/23  Yes Erick Alley, DO  losartan (COZAAR) 25 MG tablet Take 50 mg by mouth at bedtime.   Yes [provider]  metoprolol tartrate (LOPRESSOR) 50 MG tablet TAKE 1 TABLET BY MOUTH TWICE  DAILY Patient taking differently: Take 50 mg by mouth in the morning and  at bedtime. 07/17/22  Yes Sabino Dick, DO  rosuvastatin (CRESTOR) 5 MG tablet Take one tablet by mouth every Monday and Friday. Patient taking differently: Take 5 mg by mouth See admin instructions. Take 5 mg by mouth every Monday and Friday 06/25/23  Yes Erick Alley, DO  sertraline (ZOLOFT) 50 MG tablet Take 1 tablet (50 mg total) by mouth daily. 10/22/23  Yes Erick Alley, DO  traMADol (ULTRAM) 50 MG tablet Take 1 tablet (50 mg total) by mouth every 6 (six) hours as needed for moderate pain (pain score 4-6) or severe pain (pain score 7-10). 10/04/23  Yes Abigail Miyamoto, MD  Budesonide 90 MCG/ACT inhaler Inhale 1 puff into the lungs 2 (two) times daily. Patient not taking: Reported on 10/20/2023 05/04/21   Katha Cabal, DO  capsaicin (ZOSTRIX) 0.025 % cream Apply topically 2 (two) times daily. Patient not taking: Reported on 10/20/2023 05/23/18   Tillman Sers, DO  cholecalciferol (VITAMIN D3) 25 MCG (1000 UNIT) tablet Take 1 tablet (1,000 Units total) by mouth daily. Patient not taking: Reported on 10/20/2023 05/13/22   Sabino Dick, DO  Cholecalciferol (VITAMIN D3) 50 MCG (2000 UT) CHEW Chew 2,000 Units by mouth daily.    [provider]  donepezil (ARICEPT) 10 MG tablet Take 10 mg by mouth at bedtime.    [provider]  fluticasone (FLONASE) 50 MCG/ACT nasal spray USE 1 SPRAY IN BOTH NOSTRILS  DAILY Patient taking differently: Place 1 spray into both nostrils daily as needed for allergies or rhinitis. 04/16/23   Erick Alley, DO  fluticasone (FLOVENT HFA) 110 MCG/ACT inhaler Inhale 1 puff into the lungs daily as needed. Patient taking differently: Inhale 1 puff into the lungs daily as needed ("for flares"). 05/06/21   Katha Cabal, DO  lidocaine-prilocaine (EMLA) cream Apply to affected area once 11/02/23   Malachy Mood, MD  loratadine (EQ ALLERGY RELIEF) 10 MG tablet Take 1 tablet (10 mg total) by mouth daily. Patient taking differently: Take 10 mg by mouth daily as  needed for allergies or rhinitis. 10/10/18   Shirley, [place], DO  losartan (COZAAR) 50 MG tablet Take 1 tablet (50 mg total) by mouth at bedtime. Patient not taking: Reported on 10/20/2023 10/05/23   Erick Alley, DO  meloxicam (MOBIC) 15 MG tablet Take 1 tablet by mouth once daily Patient taking differently: Take 15 mg by mouth daily as needed for pain. 08/08/23   Erick Alley, DO  ondansetron (ZOFRAN) 8 MG tablet Take 1 tab (8 mg) by mouth every 8 hrs as needed for nausea/vomiting. Start third day after doxorubicin/cyclophosphamide chemotherapy. 11/02/23   Malachy Mood, MD  ondansetron (ZOFRAN-ODT) 4 MG disintegrating tablet Take 4 mg by mouth every 8 (eight) hours as needed for nausea or vomiting (dissolve orally).    [provider]  prochlorperazine (COMPAZINE) 10 MG tablet Take 1 tablet (10 mg total) by mouth every 6 (six) hours as needed for nausea or vomiting. 11/02/23   Malachy Mood, MD  TYLENOL 500 MG  tablet Take 500-1,000 mg by mouth every 6 (six) hours as needed for mild pain (pain score 1-3) or headache.    [provider]     Family History  Problem Relation Age of Onset   Hypertension Mother    Diabetes Father    Hyperlipidemia Brother    Breast cancer Paternal Grandmother 74 - 29   Heart attack Neg Hx    Sudden death Neg Hx    Colon cancer Neg Hx    Hypercalcemia Neg Hx     Social History   Socioeconomic History   Marital status: Divorced    Spouse name: Not on file   Number of children: 3   Years of education: 79   Highest education level: Manufacturing engineer (e.g., MA, MS, MEng, MEd, MSW, MBA)  Occupational History   Occupation: Control and instrumentation engineer: [employer]    Comment: pt is an Event organiser for the parishioner's service at [employer]   Tobacco Use   Smoking status: Never    Passive exposure: Never   Smokeless tobacco: Never  Vaping Use   Vaping status: Never Used  Substance and Sexual Activity   Alcohol use: No    Alcohol/week: 0.0  standard drinks of alcohol   Drug use: No   Sexual activity: Yes    Partners: Male    Birth control/protection: Post-menopausal  Other Topics Concern   Not on file  Social History Narrative   Patient lives alone in [place].    Patient has 3 children and has grandchildren.    Patient enjoys reading, spending time with her family, and very involved in her church.    Social Drivers of Health   Financial Resource Strain: Patient Declined (01/08/2023)   Received from [facility]   Overall Financial Resource Strain (CARDIA)    Difficulty of Paying Living Expenses: Patient declined  Food Insecurity: No Food Insecurity (06/01/2023)   Hunger Vital Sign    Worried About Running Out of Food in the Last Year: Never true    Ran Out of Food in the Last Year: Never true  Transportation Needs: No Transportation Needs (06/01/2023)   PRAPARE - Administrator, Civil Service (Medical): No    Lack of Transportation (Non-Medical): No  Physical Activity: Insufficiently Active (06/01/2023)   Exercise Vital Sign    Days of Exercise per Week: 3 days    Minutes of Exercise per Session: 30 min  Stress: No Stress Concern Present (06/01/2023)   [employer] of Occupational Health - Occupational Stress Questionnaire    Feeling of Stress : Not at all  Social Connections: Moderately Integrated (06/01/2023)   Social Connection and Isolation Panel [NHANES]    Frequency of Communication with Friends and Family: More than three times a week    Frequency of Social Gatherings with Friends and Family: Three times a week    Attends Religious Services: More than 4 times per year    Active Member of Clubs or Organizations: Yes    Attends Banker Meetings: 1 to 4 times per year    Marital Status: Widowed    Review of Systems  Gastrointestinal:  Positive for nausea.  Denies any N/V, chest pain, shortness of breath, fevers/chills. All other ROS negative.  Vital Signs: BP  136/70   Pulse 64   Temp 98 F (36.7 C) (Oral)   Resp 17   Ht 5\' 2"  (1.575 m)   Wt 200 lb (90.7 kg)  SpO2 100%   BMI 36.58 kg/m   Advance Care Plan: The advanced care plan/surrogate decision maker was discussed at the time of visit and documented in the medical record.    Physical Exam Vitals reviewed.  Constitutional:      Appearance: Normal appearance.  HENT:     Head: Normocephalic and atraumatic.     Mouth/Throat:     Mouth: Mucous membranes are moist.     Pharynx: Oropharynx is clear.  Cardiovascular:     Rate and Rhythm: Normal rate and regular rhythm.     Heart sounds: Normal heart sounds.  Pulmonary:     Effort: Pulmonary effort is normal.     Breath sounds: Normal breath sounds.  Abdominal:     General: Abdomen is flat.     Palpations: Abdomen is soft.  Musculoskeletal:        General: Normal range of motion.     Cervical back: Normal range of motion.  Skin:    General: Skin is warm and dry.  Neurological:     General: No focal deficit present.     Mental Status: She is alert and oriented to person, place, and time. Mental status is at baseline.  Psychiatric:        Mood and Affect: Mood normal.        Behavior: Behavior normal.        Judgment: Judgment normal.     Imaging: NM Bone Scan Whole Body Result Date: 11/02/2023 CLINICAL DATA:  LEFT breast carcinoma. EXAM: NUCLEAR MEDICINE WHOLE BODY BONE SCAN TECHNIQUE: Whole body anterior and posterior images were obtained approximately 3 hours after intravenous injection of radiopharmaceutical. RADIOPHARMACEUTICALS:  Twenty-one.  MCi Technetium-9m MDP IV COMPARISON:  CT 10/31/2023 FINDINGS: No radiotracer accumulation within the axillary or appendicular skeleton to suggest skeletal metastasis. Radiotracer uptake in the midthoracic spine course of the spine see osteophytosis on comparison CT. IMPRESSION: No evidence skeletal metastasis. Electronically Signed   By: Genevive Bi M.D.   On: 11/02/2023 09:03    CT CHEST ABDOMEN PELVIS W CONTRAST Result Date: 10/31/2023 CLINICAL DATA:  Left breast cancer initial workup * Tracking Code: BO * EXAM: CT CHEST, ABDOMEN, AND PELVIS WITH CONTRAST TECHNIQUE: Multidetector CT imaging of the chest, abdomen and pelvis was performed following the standard protocol during bolus administration of intravenous contrast. RADIATION DOSE REDUCTION: This exam was performed according to the departmental dose-optimization program which includes automated exposure control, adjustment of the mA and/or kV according to patient size and/or use of iterative reconstruction technique. CONTRAST:  OMNIPAQUE IOHEXOL 300 MG/ML  SOLN COMPARISON:  None Available. FINDINGS: CT CHEST FINDINGS Cardiovascular: No significant vascular findings. Normal heart size. No pericardial effusion. Mediastinum/Nodes: Enlarged left axillary lymph node measuring 1.0 x 0.8 cm (series 2, image 15). No other enlarged mediastinal, hilar, or axillary lymph nodes. Thyroid gland, trachea, and esophagus demonstrate no significant findings. Lungs/Pleura: Lungs are clear. No pleural effusion or pneumothorax. Musculoskeletal: Status post lumpectomy of the upper outer left breast and left axillary lymph node dissection with associated simple attenuation fluid collections (series 2, image 25, 21). No acute osseous findings. CT ABDOMEN PELVIS FINDINGS Hepatobiliary: No solid liver abnormality is seen. No gallstones, gallbladder wall thickening, or biliary dilatation. Pancreas: Unremarkable. No pancreatic ductal dilatation or surrounding inflammatory changes. Spleen: Normal in size without significant abnormality. Adrenals/Urinary Tract: Adrenal glands are unremarkable. Multiple small bilateral nonobstructive renal calculi. Benign left parapelvic and renal cortical cysts, for which no further follow-up or characterization is required. No  hydronephrosis. Bladder is unremarkable. Stomach/Bowel: Stomach is within normal limits.  Appendix appears normal. No evidence of bowel wall thickening, distention, or inflammatory changes. Vascular/Lymphatic: No significant vascular findings are present. No enlarged abdominal or pelvic lymph nodes. Reproductive: No mass or other abnormality. Other: No abdominal wall hernia or abnormality. No ascites. Musculoskeletal: No acute osseous findings. Status post left hip total arthroplasty. IMPRESSION: 1. Status post lumpectomy of the upper outer left breast and left axillary lymph node dissection with associated simple attenuation fluid collections, most consistent with postoperative seromas. 2. Enlarged left axillary lymph node measuring 1.0 x 0.8 cm, nonspecific and possibly reactive in the setting of recent surgery. Correlate with tissue findings of lymph node dissection. Attention on follow-up. 3. No other evidence of lymphadenopathy or metastatic disease in the chest, abdomen, or pelvis. 4. Nonobstructive bilateral nephrolithiasis. Electronically Signed   By: Jearld Lesch M.D.   On: 10/31/2023 12:53   EEG adult Result Date: 10/20/2023 Jefferson Fuel, MD     10/20/2023  3:39 PM Routine EEG Report SIDDA KEIMIG is a 68 y.o. female with a history of altered mental status who is undergoing an EEG to evaluate for seizures. Report: This EEG was acquired with electrodes placed according to the International 10-20 electrode system (including Fp1, Fp2, F3, F4, C3, C4, P3, P4, O1, O2, T3, T4, T5, T6, A1, A2, Fz, Cz, Pz). The following electrodes were missing or displaced: none. The occipital dominant rhythm was 8.5 Hz with overriding beta frequencies. This activity is reactive to stimulation. Drowsiness was manifested by background fragmentation; deeper stages of sleep were not identified. There was no focal slowing. There were no interictal epileptiform discharges. There were no electrographic seizures identified.  Photic stimulation and hyperventilation were not performed. Impression: This EEG was obtained  while awake and drowsy and is normal.   Clinical Correlation: Normal EEGs, however, do not rule out epilepsy. Bing Neighbors, MD [facility] [phone] If 7pm- 7am, please page neurology on call as listed in AMION.   MR BRAIN W WO CONTRAST Result Date: 10/20/2023 CLINICAL DATA:  Neuro deficit, acute, stroke suspected. Metastatic disease evaluation. History of breast cancer. Sudden onset left-sided weakness. EXAM: MRI HEAD WITHOUT AND WITH CONTRAST TECHNIQUE: Multiplanar, multiecho pulse sequences of the brain and surrounding structures were obtained without and with intravenous contrast. CONTRAST:  10mL GADAVIST GADOBUTROL 1 MMOL/ML IV SOLN COMPARISON:  Head CT 10/20/2023 and MRI 01/16/2021 FINDINGS: The study is mildly to moderately motion degraded. Brain: There is no evidence of an acute infarct, intracranial hemorrhage, mass, midline shift, or extra-axial fluid collection. Cerebral white matter T2 hyperintensities are similar to the prior MRI and are nonspecific but compatible with mild chronic small vessel ischemic disease. Cerebral volume is within normal limits for age. The ventricles are normal in size. No abnormal enhancement is identified. 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 No abnormal enhancement is identified. Vascular: Major intracranial vascular flow voids are preserved. Skull and upper cervical spine: Unremarkable bone marrow signal. Sinuses/Orbits: Unremarkable orbits. Small mucous retention cyst in the left frontal sinus. Clear mastoid air cells. Other: None. IMPRESSION: 1. No evidence of intracranial metastases or acute intracranial abnormality. 2. Mild chronic small vessel ischemic disease. Electronically Signed   By: Sebastian Ache M.D.   On: 10/20/2023 14:17   CT HEAD CODE STROKE WO CONTRAST Result Date: 10/20/2023 CLINICAL DATA:  Code stroke.  Neuro deficit, acute, stroke suspected EXAM: CT HEAD WITHOUT CONTRAST TECHNIQUE: Contiguous axial images were obtained from  the base  of the skull through the vertex without intravenous contrast. RADIATION DOSE REDUCTION: This exam was performed according to the departmental dose-optimization program which includes automated exposure control, adjustment of the mA and/or kV according to patient size and/or use of iterative reconstruction technique. COMPARISON:  CT head 07/25/19 FINDINGS: Brain: No hemorrhage. No hydrocephalus. No extra-axial fluid collection. No CT evidence of an acute cortical infarct. No mass effect. No mass lesion. Partially empty sella. Vascular: No hyperdense vessel or unexpected calcification. Skull: Normal. Negative for fracture or focal lesion. Sinuses/Orbits: No middle ear or mastoid effusion. Paranasal sinuses are notable for frothy secretions in the posterior ethmoid air cells on the left. Orbits are unremarkable. Other: None. ASPECTS [facility] Stroke Program Early CT Score): 10 IMPRESSION: No hemorrhage or CT evidence of an acute cortical infarct. Findings were paged to Dr. Derry Lory on 10/20/23 at 12:12 PM. Electronically Signed   By: Lorenza Cambridge M.D.   On: 10/20/2023 12:12    Labs:  CBC: Recent Labs    10/20/23 1159 10/20/23 1204  WBC 8.6  --   HGB 12.4 12.9  HCT 39.5 38.0  PLT 306  --     COAGS: Recent Labs    10/20/23 1159  INR 1.4*  APTT 27    BMP: Recent Labs    10/20/23 1159 10/20/23 1204  NA 142 143  K 3.9 3.8  CL 108 108  CO2 24  --   GLUCOSE 106* 102*  BUN 7* 5*  CALCIUM 10.4*  --   CREATININE 0.76 0.80  GFRNONAA >60  --     LIVER FUNCTION TESTS: Recent Labs    10/20/23 1159  BILITOT 0.5  AST 19  ALT 17  ALKPHOS 118  PROT 7.1  ALBUMIN 3.6    TUMOR MARKERS: No results for input(s): "AFPTM", "CEA", "CA199", "CHROMGRNA" in the last 8760 hours.  Assessment and Plan:  68 y.o. female with a history HTN, HLD, arthritis, depression, anxiety, and a recent diagnosis of stage III ER+/PR+/HER2- left breast cancer s/p left lumpectomy with lymph node  dissection on 10/04/23 with Dr. Barrie Dunker. Follows with Dr. Mosetta Putt, oncology. Now in need of adjuvant chemotherapy and radiation. IR consulted for portacath placement for chemotherapy treatments.   Plan for portacath placement with Dr. Fredia Sorrow 11/05/23  Risks and benefits of image guided port-a-catheter placement was discussed with the patient including, but not limited to bleeding, infection, pneumothorax, or fibrin sheath development and need for additional procedures.  All of the patient's questions were answered, patient is agreeable to proceed. Consent signed and in chart.   Thank you for this interesting consult. I greatly enjoyed meeting [name] and look forward to participating in their care. A copy of this report was sent to the requesting provider on this date.  Electronically Signed: Jama Flavors, PA-C 11/05/2023, 8:18 AM   I spent a total of  30 Minutes in face to face clinical consultation, greater than 50% of which was counseling/coordinating care for portacath placement.

## 2023-11-05 NOTE — Procedures (Signed)
Interventional Radiology Procedure Note  Procedure: Single Lumen Power Port Placement    Access:  Right IJ vein.  Findings: Catheter tip positioned at SVC/RA junction. Port is ready for immediate use.   Complications: None  EBL: < 10 mL  Recommendations:  - Ok to shower in 24 hours - Do not submerge for 7 days - Routine line care   Lashauna Arpin T. Jacobs Golab, M.D Pager:  [phone]   

## 2023-11-07 ENCOUNTER — Inpatient Hospital Stay: Payer: 59 | Admitting: Hematology

## 2023-11-07 ENCOUNTER — Ambulatory Visit (HOSPITAL_COMMUNITY): Admission: RE | Admit: 2023-11-07 | Payer: 59 | Source: Ambulatory Visit

## 2023-11-07 NOTE — Telephone Encounter (Signed)
Patient has follow up visit on 11/19/23.  Called patient to offer sooner appointment with either PCP or another provider.   She did not answer. If she calls back, please ask if she would like to be seen sooner.   Veronda Prude, RN

## 2023-11-08 ENCOUNTER — Ambulatory Visit (HOSPITAL_COMMUNITY)
Admission: RE | Admit: 2023-11-08 | Discharge: 2023-11-08 | Disposition: A | Discharge status: Home / Self Care | Payer: 59 | Source: Ambulatory Visit | Attending: Hematology | Admitting: Hematology

## 2023-11-08 DIAGNOSIS — I517 Cardiomegaly: Secondary | ICD-10-CM | POA: Insufficient documentation

## 2023-11-08 DIAGNOSIS — E785 Hyperlipidemia, unspecified: Secondary | ICD-10-CM | POA: Diagnosis not present

## 2023-11-08 DIAGNOSIS — I10 Essential (primary) hypertension: Secondary | ICD-10-CM | POA: Insufficient documentation

## 2023-11-08 DIAGNOSIS — Z170 Estrogen receptor positive status [ER+]: Secondary | ICD-10-CM | POA: Diagnosis not present

## 2023-11-08 DIAGNOSIS — C50412 Malignant neoplasm of upper-outer quadrant of left female breast: Secondary | ICD-10-CM

## 2023-11-08 DIAGNOSIS — Z0189 Encounter for other specified special examinations: Secondary | ICD-10-CM

## 2023-11-08 DIAGNOSIS — Z5111 Encounter for antineoplastic chemotherapy: Secondary | ICD-10-CM | POA: Diagnosis not present

## 2023-11-08 DIAGNOSIS — C50919 Malignant neoplasm of unspecified site of unspecified female breast: Secondary | ICD-10-CM | POA: Insufficient documentation

## 2023-11-08 DIAGNOSIS — R42 Dizziness and giddiness: Secondary | ICD-10-CM | POA: Diagnosis not present

## 2023-11-08 LAB — ECHOCARDIOGRAM COMPLETE
Area-P 1/2: 3.17 cm2
Calc EF: 55.8 %
S' Lateral: 3 cm
Single Plane A2C EF: 54.8 %
Single Plane A4C EF: 56 %

## 2023-11-08 NOTE — Progress Notes (Signed)
  Echocardiogram 2D Echocardiogram has been performed.  [name] 11/08/2023, 12:00 PM

## 2023-11-09 ENCOUNTER — Inpatient Hospital Stay: Payer: 59

## 2023-11-09 MED FILL — Fosaprepitant Dimeglumine For IV Infusion 150 MG (Base Eq): INTRAVENOUS | Qty: 5 | Status: AC

## 2023-11-11 NOTE — Assessment & Plan Note (Signed)
-  [id number], at least stage IIIA, ER+/PR+/HER2- -patient presented with a palpable left breast mass, ultrasound showed multiple enlarged left axillary lymph node. -Due to her underlying dementia, she underwent upfront surgery.  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 -Recommend CT and bone scan to rule out distant metastasis -The high risk of recurrence due to large number of positive lymph node was discussed, and the benefit of adjuvant chemotherapy, followed by adjuvant radiation, aromatase inhibitor and CDK4/6 inhibitor  --Chemotherapy consent: Side effects including but does not not limited to, fatigue, nausea, vomiting, diarrhea, hair loss, neuropathy, fluid retention, renal and kidney dysfunction, neutropenic fever, needed for blood transfusion, bleeding, cognitive deterioration from chemotherapy, were discussed with patient in great detail. She agrees to proceed. -Restaging CT chest abdomen pelvis was completed on 10/31/2023 showing no evidence of malignant lymphadenopathy and no evidence of recurrence or metastases in the chest abdomen pelvis.  Whole-body bone scan done 10/31/2023 showed no evidence of skeletal metastases. -11/08/2023 - echocardiogram showing normal LVG at 55 - 60%. No wall motion abnormality. Mild left ventricular hypertrophy with grade 1 diastolic dysfunction. No pericardial effusion. Cannot rule out small PFO.  -11/09/2023 - chemotherapy education completed 11/09/2023. -11/12/2023 - today is cycle 1 day 1 AC-T.

## 2023-11-11 NOTE — Progress Notes (Unsigned)
Patient Care Team: Erick Alley, DO as PCP - General (Family Medicine) Runell Gess, MD as PCP - Cardiology (Cardiology) York Spaniel, MD (Inactive) as Consulting Physician (Neurology) Malachy Mood, MD as Consulting Physician (Hematology and Oncology)  Clinic Day:  11/12/2023  Referring physician: Erick Alley, DO  ASSESSMENT & PLAN:   Assessment & Plan: Malignant neoplasm of upper-outer quadrant of left breast in female, estrogen receptor positive (HCC) -pT3N3aMx, at least stage IIIA, ER+/PR+/HER2- -patient presented with a palpable left breast mass, ultrasound showed multiple enlarged left axillary lymph node. -Due to her underlying dementia, she underwent upfront surgery.  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 -Recommend CT and bone scan to rule out distant metastasis -The high risk of recurrence due to large number of positive lymph node was discussed, and the benefit of adjuvant chemotherapy, followed by adjuvant radiation, aromatase inhibitor and CDK4/6 inhibitor  --Chemotherapy consent: Side effects including but does not not limited to, fatigue, nausea, vomiting, diarrhea, hair loss, neuropathy, fluid retention, renal and kidney dysfunction, neutropenic fever, needed for blood transfusion, bleeding, cognitive deterioration from chemotherapy, were discussed with patient in great detail. She agrees to proceed. -Restaging CT chest abdomen pelvis was completed on 10/31/2023 showing no evidence of malignant lymphadenopathy and no evidence of recurrence or metastases in the chest abdomen pelvis.  Whole-body bone scan done 10/31/2023 showed no evidence of skeletal metastases. -11/08/2023 - echocardiogram showing normal LVG at 55 - 60%. No wall motion abnormality. Mild left ventricular hypertrophy with grade 1 diastolic dysfunction. No pericardial effusion. Cannot rule out small PFO.  -11/09/2023 - chemotherapy education completed  11/09/2023. -11/12/2023 - today is cycle 1 day 1 AC-T.    Plan: Labs reviewed  -CBC showing WBC 6.5; Hgb 11.7; Hct 37.1; Plt 247; Anc 3.1 -CMP - K 3.5; glucose 104; BUN 10; Creatinine 0.72; eGFR >60; Ca 10.5; LFTs normal.   -echocardiogram done 11/08/2023 showed normal LVF at 55% to 60% with mild left ventricular hypertrophy. Grade 1 diastolic dysfunction.  -chemotherapy education completed on 11/09/2023.  Today is cycle 1 day 1 adriamycin and cytoxan. Patient's labs and presentation are satisfactory for treatment today. -phone visit in 1 week for toxicity check.  Las/flush, follow up with Dr. Mosetta Putt, and treatment as scheduled in 2 weeks.   The patient understands the plans discussed today and is in agreement with them.  She knows to contact our office if she develops concerns prior to her next appointment.  I provided 25 minutes of face-to-face time during this encounter and > 50% was spent counseling as documented under my assessment and plan.    Carlean Jews, NP  [facility] - A DEPT OF [facility] [address] [place] [address] Dept: [phone] Dept Fax: [phone]   No orders of the defined types were placed in this encounter.     CHIEF COMPLAINT:  CC: Follow-up breast cancer left side, estrogen receptor positive.  Current Treatment: Adjuvant chemo with AC-T  INTERVAL HISTORY:  Letisha is here today for repeat clinical assessment.  Last seen by Dr. Mosetta Putt on 11/02/2023.  CT chest abdomen pelvis was negative for evidence of lymphadenopathy or metastatic disease.  Bone scan showed no evidence of skeletal metastases.  She underwent port placement for chemotherapy on 11/06/2023. Echocardiogram done 11/08/2023 showed normal LVF at 55 to 60% with no wall motion abnormalities. She does have grade 1 systolic dysfunction. She had chemotherapy  education class on 11/09/2023. She has not questions. States that she feels a  little nauseated, but otherwise, OK. Today she is to start cycle 1 day 1 of chemotherapy with AC every 14 days followed by Taxol weekly for 12 weeks.  She denies chest pain, chest pressure, or shortness of breath. She denies headaches or visual disturbances. She denies abdominal pain, nausea, vomiting, or changes in bowel or bladder habits.  She denies pain. Her appetite is fair. Reduced due to anxiety. Her weight has decreased 8 pounds over last week .  I have reviewed the past medical history, past surgical history, social history and family history with the patient and they are unchanged from previous note.  ALLERGIES:  is allergic to hydrochlorothiazide, hydrocodone, and simvastatin.  MEDICATIONS:  Current Outpatient Medications  Medication Sig Dispense Refill   albuterol (VENTOLIN HFA) 108 (90 Base) MCG/ACT inhaler USE 2 INHALATIONS BY MOUTH EVERY 6 HOURS AS NEEDED FOR WHEEZING  OR SHORTNESS OF BREATH (Patient taking differently: Inhale 2 puffs into the lungs every 6 (six) hours as needed for wheezing or shortness of breath.) 26.8 g 2   amLODipine (NORVASC) 10 MG tablet TAKE 1 TABLET BY MOUTH EVERY  MORNING 100 tablet 2   aspirin EC 81 MG tablet Take 81 mg by mouth daily. Swallow whole.     Budesonide 90 MCG/ACT inhaler Inhale 1 puff into the lungs 2 (two) times daily. (Patient not taking: Reported on 10/20/2023) 1 each 0   capsaicin (ZOSTRIX) 0.025 % cream Apply topically 2 (two) times daily. (Patient not taking: Reported on 10/20/2023) 60 g 0   cholecalciferol (VITAMIN D3) 25 MCG (1000 UNIT) tablet Take 1 tablet (1,000 Units total) by mouth daily. (Patient not taking: Reported on 10/20/2023) 7 tablet 0   Cholecalciferol (VITAMIN D3) 50 MCG (2000 UT) CHEW Chew 2,000 Units by mouth daily.     donepezil (ARICEPT) 10 MG tablet Take 10 mg by mouth at bedtime.     fluticasone (FLONASE) 50 MCG/ACT nasal spray USE 1 SPRAY IN BOTH NOSTRILS  DAILY (Patient taking differently: Place 1 spray into both nostrils  daily as needed for allergies or rhinitis.) 16 g 0   fluticasone (FLOVENT HFA) 110 MCG/ACT inhaler Inhale 1 puff into the lungs daily as needed. (Patient taking differently: Inhale 1 puff into the lungs daily as needed ("for flares").) 1 each 12   gabapentin (NEURONTIN) 100 MG capsule TAKE 1 CAPSULE BY MOUTH DAILY 90 capsule 3   lidocaine-prilocaine (EMLA) cream Apply to affected area once 30 g 3   loratadine (EQ ALLERGY RELIEF) 10 MG tablet Take 1 tablet (10 mg total) by mouth daily. (Patient taking differently: Take 10 mg by mouth daily as needed for allergies or rhinitis.) 90 tablet 3   losartan (COZAAR) 25 MG tablet Take 50 mg by mouth at bedtime.     losartan (COZAAR) 50 MG tablet Take 1 tablet (50 mg total) by mouth at bedtime. (Patient not taking: Reported on 10/20/2023) 90 tablet 3   meloxicam (MOBIC) 15 MG tablet Take 1 tablet by mouth once daily (Patient taking differently: Take 15 mg by mouth daily as needed for pain.) 30 tablet 0   metoprolol tartrate (LOPRESSOR) 50 MG tablet TAKE 1 TABLET BY MOUTH TWICE  DAILY (Patient taking differently: Take 50 mg by mouth in the morning and at bedtime.) 200 tablet 2   ondansetron (ZOFRAN) 8 MG tablet Take 1 tab (8 mg) by mouth every 8 hrs as needed for nausea/vomiting. Start third day  after doxorubicin/cyclophosphamide chemotherapy. 30 tablet 1   ondansetron (ZOFRAN-ODT) 4 MG disintegrating tablet Take 4 mg by mouth every 8 (eight) hours as needed for nausea or vomiting (dissolve orally).     prochlorperazine (COMPAZINE) 10 MG tablet Take 1 tablet (10 mg total) by mouth every 6 (six) hours as needed for nausea or vomiting. 30 tablet 1   rosuvastatin (CRESTOR) 5 MG tablet Take one tablet by mouth every Monday and Friday. (Patient taking differently: Take 5 mg by mouth See admin instructions. Take 5 mg by mouth every Monday and Friday) 20 tablet 4   sertraline (ZOLOFT) 50 MG tablet Take 1 tablet (50 mg total) by mouth daily. 30 tablet 0   traMADol (ULTRAM)  50 MG tablet Take 1 tablet (50 mg total) by mouth every 6 (six) hours as needed for moderate pain (pain score 4-6) or severe pain (pain score 7-10). 25 tablet 0   TYLENOL 500 MG tablet Take 500-1,000 mg by mouth every 6 (six) hours as needed for mild pain (pain score 1-3) or headache.     No current facility-administered medications for this visit.   Facility-Administered Medications Ordered in Other Visits  Medication Dose Route Frequency Provider Last Rate Last Admin   0.9 %  sodium chloride infusion   Intravenous Continuous Malachy Mood, MD       cyclophosphamide (CYTOXAN) 1,160 mg in sodium chloride 0.9 % 250 mL chemo infusion  600 mg/m2 (Treatment Plan Recorded) Intravenous Once Malachy Mood, MD       dexamethasone (DECADRON) injection 10 mg  10 mg Intravenous Once Malachy Mood, MD       DOXOrubicin (ADRIAMYCIN) chemo injection 116 mg  60 mg/m2 (Treatment Plan Recorded) Intravenous Once Malachy Mood, MD       fosaprepitant (EMEND) 150 mg in sodium chloride 0.9 % 145 mL IVPB  150 mg Intravenous Once Malachy Mood, MD       heparin lock flush 100 unit/mL  500 Units Intracatheter Once PRN Malachy Mood, MD       palonosetron (ALOXI) injection 0.25 mg  0.25 mg Intravenous Once Malachy Mood, MD       sodium chloride flush (NS) 0.9 % injection 10 mL  10 mL Intracatheter PRN Malachy Mood, MD        HISTORY OF PRESENT ILLNESS:   Oncology History  Infiltrating ductal carcinoma of breast (HCC)  09/22/2023 Initial Diagnosis   Infiltrating ductal carcinoma of breast (HCC)   11/12/2023 -  Chemotherapy   Patient is on Treatment Plan : BREAST DOSE DENSE AC q14d / PACLitaxel q7d     Malignant neoplasm of upper-outer quadrant of left breast in female, estrogen receptor positive (HCC)  09/28/2023 Initial Diagnosis   Malignant neoplasm of upper-outer quadrant of left breast in female, estrogen receptor positive (HCC)   09/28/2023 Cancer Staging   Staging form: Breast, AJCC 8th Edition - Clinical stage from 09/28/2023: Stage  IIA (cT2, cN1, cM0, G2, ER+, PR+, HER2-) - Signed by Malachy Mood, MD on 09/28/2023 Histologic grading system: 3 grade system   10/04/2023 Cancer Staging   Staging form: Breast, AJCC 8th Edition - Pathologic stage from 10/04/2023: Stage IIIA (pT2, pN3a, cM0, G2, ER+, PR+, HER2-) - Signed by Malachy Mood, MD on 10/19/2023 Histologic grading system: 3 grade system Residual tumor (R): R0 - None   10/31/2023 Imaging   NM whole body bone scan IMPRESSION: No evidence skeletal metastasis.   10/31/2023 Imaging   CT chest abdomen and pelvis with contrast  IMPRESSION: 1.  Status post lumpectomy of the upper outer left breast and left axillary lymph node dissection with associated simple attenuation fluid collections, most consistent with postoperative seromas. 2. Enlarged left axillary lymph node measuring 1.0 x 0.8 cm, nonspecific and possibly reactive in the setting of recent surgery. Correlate with tissue findings of lymph node dissection. Attention on follow-up. 3. No other evidence of lymphadenopathy or metastatic disease in the chest, abdomen, or pelvis. 4. Nonobstructive bilateral nephrolithiasis.   11/08/2023 Echocardiogram   Echocardiogram FINDINGS   Left Ventricle: Left ventricular ejection fraction, by estimation, is 55 to 60%. Left ventricular ejection fraction by 3D volume is 56 %. The left ventricle has normal function. The left ventricle has no regional wall motion abnormalities. Global longitudinal strain performed but not reported based on interpreter judgement due to suboptimal tracking. The left ventricular internal cavity size was mildly dilated. There is mild left ventricular hypertrophy. Left ventricular diastolic parameters are consistent with Grade I diastolic dysfunction (impaired relaxation). Indeterminate filling pressures.  Right Ventricle: The right ventricular size is normal. No increase in right ventricular wall thickness. Right ventricular systolic function is  low normal. There  is normal pulmonary artery systolic pressure. The tricuspid regurgitant velocity is 2.12 m/s,  and with an assumed right atrial pressure of 3 mmHg, the estimated right ventricular systolic pressure is 21.0 mmHg.  Left Atrium: Left atrial size was normal in size.  Right Atrium: Right atrial size was normal in size.  Pericardium: There is no evidence of pericardial effusion.  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 No evidence of mitral valve stenosis.  Tricuspid Valve: The tricuspid valve is normal in structure. Tricuspid valve regurgitation is mild . No evidence of tricuspid stenosis.  Aortic Valve: The aortic valve is tricuspid. Aortic valve regurgitation is not visualized. No aortic stenosis is present.  Pulmonic Valve: The pulmonic valve was normal in structure. Pulmonic valve regurgitation is mild. No evidence of pulmonic stenosis.  Aorta: The aortic root and ascending aorta are structurally normal, with no evidence of dilitation.  Venous: The inferior vena cava is normal in size with greater than 50% respiratory variability, suggesting right atrial pressure of 3 mmHg.  IAS/Shunts: There is redundancy of the interatrial septum. Cannot exclude a small PFO.         REVIEW OF SYSTEMS:   Constitutional: Denies fevers or chills. She has had an 8 pound weight loss since she was here 11/05/2023.  Eyes: Denies blurriness of vision Ears, nose, mouth, throat, and face: Denies mucositis or sore throat Respiratory: Denies cough, dyspnea or wheezes Cardiovascular: Denies palpitation, chest discomfort or lower extremity swelling Gastrointestinal:  feels nauseated this morning. No vomiting. Feels this is related to anxiety.  Skin: Denies abnormal skin rashes Lymphatics: Denies new lymphadenopathy or easy bruising Neurological:Denies numbness, tingling or new weaknesses Behavioral/Psych: Mood is stable, no new changes  All other systems were reviewed with the  patient and are negative.   VITALS:   Today's Vitals   11/12/23 0850 11/12/23 0854  BP: 134/71   Pulse: 66   Resp: 18   Temp: 98.2 F (36.8 C)   TempSrc: Temporal   SpO2: 100%   Weight: 192 lb (87.1 kg)   Height: 5\' 2"  (1.575 m)   PainSc:  0-No pain   Body mass index is 35.12 kg/m.   Wt Readings from Last 3 Encounters:  11/12/23 192 lb (87.1 kg)  11/05/23 200 lb (90.7 kg)  11/02/23 189 lb 3.2 oz (85.8 kg)  Body mass index is 35.12 kg/m.  Performance status (ECOG): 1 - Symptomatic but completely ambulatory  PHYSICAL EXAM:   GENERAL:alert, no distress and comfortable SKIN: skin color, texture, turgor are normal, no rashes or significant lesions EYES: normal, Conjunctiva are pink and non-injected, sclera clear OROPHARYNX:no exudate, no erythema and lips, buccal mucosa, and tongue normal  NECK: supple, thyroid normal size, non-tender, without nodularity LYMPH:  no palpable lymphadenopathy in the cervical, axillary or inguinal LUNGS: clear to auscultation and percussion with normal breathing effort HEART: regular rate & rhythm and no murmurs and no lower extremity edema ABDOMEN:abdomen soft, non-tender and normal bowel sounds Musculoskeletal:no cyanosis of digits and no clubbing  NEURO: alert & oriented x 3 with fluent speech, no focal motor/sensory deficits  LABORATORY DATA:  I have reviewed the data as listed    Component Value Date/Time   NA 141 11/12/2023 0819   NA 141 12/29/2021 0953   K 3.5 11/12/2023 0819   CL 110 11/12/2023 0819   CO2 25 11/12/2023 0819   GLUCOSE 104 (H) 11/12/2023 0819   BUN 10 11/12/2023 0819   BUN 7 (L) 12/29/2021 0953   CREATININE 0.72 11/12/2023 0819   CREATININE 0.75 05/03/2015 1559   CALCIUM 10.5 (H) 11/12/2023 0819   PROT 6.9 11/12/2023 0819   PROT 6.9 11/30/2021 1040   ALBUMIN 3.9 11/12/2023 0819   ALBUMIN 4.2 11/30/2021 1040   AST 13 (L) 11/12/2023 0819   ALT 10 11/12/2023 0819   ALKPHOS 124 11/12/2023 0819   BILITOT  0.4 11/12/2023 0819   GFRNONAA >60 11/12/2023 0819   GFRNONAA >89 01/15/2014 1509   GFRAA 95 10/12/2020 0825   GFRAA >89 01/15/2014 1509     Lab Results  Component Value Date   WBC 6.5 11/12/2023   NEUTROABS 3.1 11/12/2023   HGB 11.7 (L) 11/12/2023   HCT 37.1 11/12/2023   MCV 87.9 11/12/2023   PLT 247 11/12/2023      RADIOGRAPHIC STUDIES: ECHOCARDIOGRAM COMPLETE Result Date: 11/08/2023    ECHOCARDIOGRAM REPORT   Patient Name:   AARIAN CLEAVER Date of Exam: 11/08/2023 Medical Rec #:  [id number]       Height:       62.0 in Accession #:    [id number]      Weight:       200.0 lb Date of Birth:  May 31, 1956       BSA:          1.912 m Patient Age:    67 years        BP:           135/92 mmHg Patient Gender: F               HR:           72 bpm. Exam Location:  Outpatient Procedure: 2D Echo, 3D Echo, Cardiac Doppler, Color Doppler and Strain Analysis Indications:    Z51.11 Encounter for antineoplastic chemotheraphy  History:        Patient has prior history of Echocardiogram examinations, most                 recent 09/19/2019. Signs/Symptoms:Dizziness/Lightheadedness; Risk                 Factors:Hypertension and Dyslipidemia. Breast cancer.  Sonographer:    Sheralyn Boatman RDCS Referring Phys: [id number] Malachy Mood IMPRESSIONS  1. Left ventricular ejection fraction, by estimation, is 55 to 60%. Left ventricular ejection fraction by 3D volume is 56 %. The  left ventricle has normal function. The left ventricle has no regional wall motion abnormalities. The left ventricular internal cavity size was mildly dilated. There is mild left ventricular hypertrophy. Left ventricular diastolic parameters are consistent with Grade I diastolic dysfunction (impaired relaxation).  2. Right ventricular systolic function is low normal. The right ventricular size is normal. There is normal pulmonary artery systolic pressure. The estimated right ventricular systolic pressure is 21.0 mmHg.  3. The mitral valve is myxomatous. Mild  mitral valve regurgitation. No evidence of mitral stenosis.  4. The aortic valve is tricuspid. Aortic valve regurgitation is not visualized. No aortic stenosis is present.  5. The inferior vena cava is normal in size with greater than 50% respiratory variability, suggesting right atrial pressure of 3 mmHg.  6. Cannot exclude a small PFO. Comparison(s): No significant change from prior study. 09/19/2019: LVEF 55-60%. FINDINGS  Left Ventricle: Left ventricular ejection fraction, by estimation, is 55 to 60%. Left ventricular ejection fraction by 3D volume is 56 %. The left ventricle has normal function. The left ventricle has no regional wall motion abnormalities. Global longitudinal strain performed but not reported based on interpreter judgement due to suboptimal tracking. The left ventricular internal cavity size was mildly dilated. There is mild left ventricular hypertrophy. Left ventricular diastolic parameters are consistent with Grade I diastolic dysfunction (impaired relaxation). Indeterminate filling pressures. Right Ventricle: The right ventricular size is normal. No increase in right ventricular wall thickness. Right ventricular systolic function is low normal. There is normal pulmonary artery systolic pressure. The tricuspid regurgitant velocity is 2.12 m/s,  and with an assumed right atrial pressure of 3 mmHg, the estimated right ventricular systolic pressure is 21.0 mmHg. Left Atrium: Left atrial size was normal in size. Right Atrium: Right atrial size was normal in size. Pericardium: There is no evidence of pericardial effusion. 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 No evidence of mitral valve stenosis. Tricuspid Valve: The tricuspid valve is normal in structure. Tricuspid valve regurgitation is mild . No evidence of tricuspid stenosis. Aortic Valve: The aortic valve is tricuspid. Aortic valve regurgitation is not visualized. No aortic stenosis is present. Pulmonic  Valve: The pulmonic valve was normal in structure. Pulmonic valve regurgitation is mild. No evidence of pulmonic stenosis. Aorta: The aortic root and ascending aorta are structurally normal, with no evidence of dilitation. Venous: The inferior vena cava is normal in size with greater than 50% respiratory variability, suggesting right atrial pressure of 3 mmHg. IAS/Shunts: There is redundancy of the interatrial septum. Cannot exclude a small PFO.  LEFT VENTRICLE PLAX 2D LVIDd:         4.05 cm         Diastology LVIDs:         3.00 cm         LV e' medial:    4.68 cm/s LV PW:         1.10 cm         LV E/e' medial:  12.5 LV IVS:        1.05 cm         LV e' lateral:   6.42 cm/s LVOT diam:     2.20 cm         LV E/e' lateral: 9.1 LV SV:         70 LV SV Index:   37 LVOT Area:     3.80 cm        3D Volume EF  LV 3D EF:    Left                                             ventricul LV Volumes (MOD)                            ar LV vol d, MOD    86.1 ml                    ejection A2C:                                        fraction LV vol d, MOD    96.7 ml                    by 3D A4C:                                        volume is LV vol s, MOD    38.9 ml                    56 %. A2C: LV vol s, MOD    42.5 ml A4C:                           3D Volume EF: LV SV MOD A2C:   47.2 ml       3D EF:        56 % LV SV MOD A4C:   96.7 ml       LV EDV:       115 ml LV SV MOD BP:    52.4 ml       LV ESV:       51 ml                                LV SV:        65 ml RIGHT VENTRICLE            IVC RV S prime:     9.68 cm/s  IVC diam: 1.70 cm TAPSE (M-mode): 1.9 cm LEFT ATRIUM             Index        RIGHT ATRIUM          Index LA diam:        2.60 cm 1.36 cm/m   RA Area:     6.66 cm LA Vol (A2C):   34.6 ml 18.10 ml/m  RA Volume:   9.00 ml  4.71 ml/m LA Vol (A4C):   36.1 ml 18.88 ml/m LA Biplane Vol: 34.9 ml 18.26 ml/m  AORTIC VALVE             PULMONIC VALVE LVOT Vmax:   93.30 cm/s  PR End Diast  Vel: 1.90 msec LVOT Vmean:  59.700 cm/s LVOT VTI:    0.184 m  AORTA Ao Root diam: 3.00 cm Ao Asc diam:  3.50 cm MITRAL VALVE  TRICUSPID VALVE MV Area (PHT): 3.17 cm    TR Peak grad:   18.0 mmHg MV Decel Time: 239 msec    TR Vmax:        212.00 cm/s MV E velocity: 58.70 cm/s MV A velocity: 94.30 cm/s  SHUNTS MV E/A ratio:  0.62        Systemic VTI:  0.18 m                            Systemic Diam: 2.20 cm Zoila Shutter MD Electronically signed by Zoila Shutter MD Signature Date/Time: 11/08/2023/2:52:52 PM    Final    IR IMAGING GUIDED PORT INSERTION Result Date: 11/06/2023 CLINICAL DATA:  Left breast carcinoma and need for porta cath to begin chemotherapy. EXAM: IMPLANTED PORT A CATH PLACEMENT WITH ULTRASOUND AND FLUOROSCOPIC GUIDANCE ANESTHESIA/SEDATION: Moderate (conscious) sedation was employed during this procedure. A total of Versed 1.5 mg and Fentanyl 100 mcg was administered intravenously. Moderate Sedation Time: 36 minutes. The patient's level of consciousness and vital signs were monitored continuously by radiology nursing throughout the procedure under my direct supervision. FLUOROSCOPY: 24 seconds.  2.0 mGy. PROCEDURE: The procedure, risks, benefits, and alternatives were explained to the patient. Questions regarding the procedure were encouraged and answered. The patient understands and consents to the procedure. A time-out was performed prior to initiating the procedure. Ultrasound was utilized to confirm patency of the right internal jugular vein. No ultrasound image was saved and recorded. The right neck and chest were prepped with chlorhexidine in a sterile fashion, and a sterile drape was applied covering the operative field. Maximum barrier sterile technique with sterile gowns and gloves were used for the procedure. Local anesthesia was provided with 1% lidocaine. After creating a small venotomy incision, a 21 gauge needle was advanced into the right internal jugular vein under  direct, real-time ultrasound guidance. Ultrasound image documentation was performed. After securing guidewire access, an 8 Fr dilator was placed. A J-wire was kinked to measure appropriate catheter length. A subcutaneous port pocket was then created along the upper chest wall utilizing sharp and blunt dissection. Portable cautery was utilized. The pocket was irrigated with sterile saline. A single lumen power injectable port was chosen for placement. The 8 Fr catheter was tunneled from the port pocket site to the venotomy incision. The port was placed in the pocket. External catheter was trimmed to appropriate length based on guidewire measurement. At the venotomy, an 8 Fr peel-away sheath was placed over a guidewire. The catheter was then placed through the sheath and the sheath removed. Final catheter positioning was confirmed and documented with a fluoroscopic spot image. The port was accessed with a needle and aspirated and flushed with heparinized saline. The access needle was removed. The venotomy and port pocket incisions were closed with subcutaneous 3-0 Monocryl and subcuticular 4-0 Vicryl. Dermabond was applied to both incisions. COMPLICATIONS: COMPLICATIONS None FINDINGS: After catheter placement, the tip lies at the cavo-atrial junction. The catheter aspirates normally and is ready for immediate use. IMPRESSION: Placement of single lumen port a cath via right internal jugular vein. The catheter tip lies at the cavo-atrial junction. A power injectable port a cath was placed and is ready for immediate use. Electronically Signed   By: Irish Lack M.D.   On: 11/06/2023 10:00   NM Bone Scan Whole Body Result Date: 11/02/2023 CLINICAL DATA:  LEFT breast carcinoma. EXAM: NUCLEAR MEDICINE WHOLE BODY BONE SCAN TECHNIQUE: Whole body anterior  and posterior images were obtained approximately 3 hours after intravenous injection of radiopharmaceutical. RADIOPHARMACEUTICALS:  Twenty-one.  MCi Technetium-69m MDP  IV COMPARISON:  CT 10/31/2023 FINDINGS: No radiotracer accumulation within the axillary or appendicular skeleton to suggest skeletal metastasis. Radiotracer uptake in the midthoracic spine course of the spine see osteophytosis on comparison CT. IMPRESSION: No evidence skeletal metastasis. Electronically Signed   By: Genevive Bi M.D.   On: 11/02/2023 09:03   CT CHEST ABDOMEN PELVIS W CONTRAST Result Date: 10/31/2023 CLINICAL DATA:  Left breast cancer initial workup * Tracking Code: BO * EXAM: CT CHEST, ABDOMEN, AND PELVIS WITH CONTRAST TECHNIQUE: Multidetector CT imaging of the chest, abdomen and pelvis was performed following the standard protocol during bolus administration of intravenous contrast. RADIATION DOSE REDUCTION: This exam was performed according to the departmental dose-optimization program which includes automated exposure control, adjustment of the mA and/or kV according to patient size and/or use of iterative reconstruction technique. CONTRAST:  OMNIPAQUE IOHEXOL 300 MG/ML  SOLN COMPARISON:  None Available. FINDINGS: CT CHEST FINDINGS Cardiovascular: No significant vascular findings. Normal heart size. No pericardial effusion. Mediastinum/Nodes: Enlarged left axillary lymph node measuring 1.0 x 0.8 cm (series 2, image 15). No other enlarged mediastinal, hilar, or axillary lymph nodes. Thyroid gland, trachea, and esophagus demonstrate no significant findings. Lungs/Pleura: Lungs are clear. No pleural effusion or pneumothorax. Musculoskeletal: Status post lumpectomy of the upper outer left breast and left axillary lymph node dissection with associated simple attenuation fluid collections (series 2, image 25, 21). No acute osseous findings. CT ABDOMEN PELVIS FINDINGS Hepatobiliary: No solid liver abnormality is seen. No gallstones, gallbladder wall thickening, or biliary dilatation. Pancreas: Unremarkable. No pancreatic ductal dilatation or surrounding inflammatory changes. Spleen: Normal  in size without significant abnormality. Adrenals/Urinary Tract: Adrenal glands are unremarkable. Multiple small bilateral nonobstructive renal calculi. Benign left parapelvic and renal cortical cysts, for which no further follow-up or characterization is required. No hydronephrosis. Bladder is unremarkable. Stomach/Bowel: Stomach is within normal limits. Appendix appears normal. No evidence of bowel wall thickening, distention, or inflammatory changes. Vascular/Lymphatic: No significant vascular findings are present. No enlarged abdominal or pelvic lymph nodes. Reproductive: No mass or other abnormality. Other: No abdominal wall hernia or abnormality. No ascites. Musculoskeletal: No acute osseous findings. Status post left hip total arthroplasty. IMPRESSION: 1. Status post lumpectomy of the upper outer left breast and left axillary lymph node dissection with associated simple attenuation fluid collections, most consistent with postoperative seromas. 2. Enlarged left axillary lymph node measuring 1.0 x 0.8 cm, nonspecific and possibly reactive in the setting of recent surgery. Correlate with tissue findings of lymph node dissection. Attention on follow-up. 3. No other evidence of lymphadenopathy or metastatic disease in the chest, abdomen, or pelvis. 4. Nonobstructive bilateral nephrolithiasis. Electronically Signed   By: Jearld Lesch M.D.   On: 10/31/2023 12:53   EEG adult Result Date: 10/20/2023 Jefferson Fuel, MD     10/20/2023  3:39 PM Routine EEG Report LILYGRACE RODICK is a 68 y.o. female with a history of altered mental status who is undergoing an EEG to evaluate for seizures. Report: This EEG was acquired with electrodes placed according to the International 10-20 electrode system (including Fp1, Fp2, F3, F4, C3, C4, P3, P4, O1, O2, T3, T4, T5, T6, A1, A2, Fz, Cz, Pz). The following electrodes were missing or displaced: none. The occipital dominant rhythm was 8.5 Hz with overriding beta frequencies. This  activity is reactive to stimulation. Drowsiness was manifested by background fragmentation; deeper stages  of sleep were not identified. There was no focal slowing. There were no interictal epileptiform discharges. There were no electrographic seizures identified.  Photic stimulation and hyperventilation were not performed. Impression: This EEG was obtained while awake and drowsy and is normal.   Clinical Correlation: Normal EEGs, however, do not rule out epilepsy. Bing Neighbors, MD [facility] [phone] If 7pm- 7am, please page neurology on call as listed in AMION.   MR BRAIN W WO CONTRAST Result Date: 10/20/2023 CLINICAL DATA:  Neuro deficit, acute, stroke suspected. Metastatic disease evaluation. History of breast cancer. Sudden onset left-sided weakness. EXAM: MRI HEAD WITHOUT AND WITH CONTRAST TECHNIQUE: Multiplanar, multiecho pulse sequences of the brain and surrounding structures were obtained without and with intravenous contrast. CONTRAST:  10mL GADAVIST GADOBUTROL 1 MMOL/ML IV SOLN COMPARISON:  Head CT 10/20/2023 and MRI 01/16/2021 FINDINGS: The study is mildly to moderately motion degraded. Brain: There is no evidence of an acute infarct, intracranial hemorrhage, mass, midline shift, or extra-axial fluid collection. Cerebral white matter T2 hyperintensities are similar to the prior MRI and are nonspecific but compatible with mild chronic small vessel ischemic disease. Cerebral volume is within normal limits for age. The ventricles are normal in size. No abnormal enhancement is identified. 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 No abnormal enhancement is identified. Vascular: Major intracranial vascular flow voids are preserved. Skull and upper cervical spine: Unremarkable bone marrow signal. Sinuses/Orbits: Unremarkable orbits. Small mucous retention cyst in the left frontal sinus. Clear mastoid air cells. Other: None. IMPRESSION: 1. No evidence of intracranial metastases or  acute intracranial abnormality. 2. Mild chronic small vessel ischemic disease. Electronically Signed   By: Sebastian Ache M.D.   On: 10/20/2023 14:17   CT HEAD CODE STROKE WO CONTRAST Result Date: 10/20/2023 CLINICAL DATA:  Code stroke.  Neuro deficit, acute, stroke suspected EXAM: CT HEAD WITHOUT CONTRAST TECHNIQUE: Contiguous axial images were obtained from the base of the skull through the vertex without intravenous contrast. RADIATION DOSE REDUCTION: This exam was performed according to the departmental dose-optimization program which includes automated exposure control, adjustment of the mA and/or kV according to patient size and/or use of iterative reconstruction technique. COMPARISON:  CT head 07/25/19 FINDINGS: Brain: No hemorrhage. No hydrocephalus. No extra-axial fluid collection. No CT evidence of an acute cortical infarct. No mass effect. No mass lesion. Partially empty sella. Vascular: No hyperdense vessel or unexpected calcification. Skull: Normal. Negative for fracture or focal lesion. Sinuses/Orbits: No middle ear or mastoid effusion. Paranasal sinuses are notable for frothy secretions in the posterior ethmoid air cells on the left. Orbits are unremarkable. Other: None. ASPECTS [facility] Stroke Program Early CT Score): 10 IMPRESSION: No hemorrhage or CT evidence of an acute cortical infarct. Findings were paged to Dr. Derry Lory on 10/20/23 at 12:12 PM. Electronically Signed   By: Lorenza Cambridge M.D.   On: 10/20/2023 12:12

## 2023-11-12 ENCOUNTER — Other Ambulatory Visit: Payer: Self-pay | Admitting: Nurse Practitioner

## 2023-11-12 ENCOUNTER — Inpatient Hospital Stay: Payer: 59

## 2023-11-12 ENCOUNTER — Ambulatory Visit (HOSPITAL_BASED_OUTPATIENT_CLINIC_OR_DEPARTMENT_OTHER): Payer: 59 | Admitting: Physician Assistant

## 2023-11-12 ENCOUNTER — Encounter: Payer: Self-pay | Admitting: Nurse Practitioner

## 2023-11-12 ENCOUNTER — Encounter: Payer: Self-pay | Admitting: Hematology

## 2023-11-12 ENCOUNTER — Inpatient Hospital Stay (HOSPITAL_BASED_OUTPATIENT_CLINIC_OR_DEPARTMENT_OTHER): Payer: 59 | Admitting: Nurse Practitioner

## 2023-11-12 VITALS — BP 134/71 | HR 66 | Temp 98.2°F | Resp 18 | Ht 62.0 in | Wt 192.0 lb

## 2023-11-12 VITALS — BP 118/57 | HR 62 | Resp 18

## 2023-11-12 DIAGNOSIS — M8580 Other specified disorders of bone density and structure, unspecified site: Secondary | ICD-10-CM | POA: Diagnosis not present

## 2023-11-12 DIAGNOSIS — Z1721 Progesterone receptor positive status: Secondary | ICD-10-CM | POA: Diagnosis not present

## 2023-11-12 DIAGNOSIS — C50412 Malignant neoplasm of upper-outer quadrant of left female breast: Secondary | ICD-10-CM

## 2023-11-12 DIAGNOSIS — Z5189 Encounter for other specified aftercare: Secondary | ICD-10-CM | POA: Diagnosis not present

## 2023-11-12 DIAGNOSIS — I10 Essential (primary) hypertension: Secondary | ICD-10-CM | POA: Diagnosis not present

## 2023-11-12 DIAGNOSIS — C50912 Malignant neoplasm of unspecified site of left female breast: Secondary | ICD-10-CM

## 2023-11-12 DIAGNOSIS — Z170 Estrogen receptor positive status [ER+]: Secondary | ICD-10-CM

## 2023-11-12 DIAGNOSIS — C773 Secondary and unspecified malignant neoplasm of axilla and upper limb lymph nodes: Secondary | ICD-10-CM | POA: Diagnosis not present

## 2023-11-12 DIAGNOSIS — T50905A Adverse effect of unspecified drugs, medicaments and biological substances, initial encounter: Secondary | ICD-10-CM

## 2023-11-12 DIAGNOSIS — Z1732 Human epidermal growth factor receptor 2 negative status: Secondary | ICD-10-CM | POA: Diagnosis not present

## 2023-11-12 DIAGNOSIS — Z5111 Encounter for antineoplastic chemotherapy: Secondary | ICD-10-CM | POA: Diagnosis not present

## 2023-11-12 DIAGNOSIS — Z79899 Other long term (current) drug therapy: Secondary | ICD-10-CM | POA: Diagnosis not present

## 2023-11-12 DIAGNOSIS — R110 Nausea: Secondary | ICD-10-CM | POA: Diagnosis not present

## 2023-11-12 LAB — CMP (CANCER CENTER ONLY)
ALT: 10 U/L (ref 0–44)
AST: 13 U/L — ABNORMAL LOW (ref 15–41)
Albumin: 3.9 g/dL (ref 3.5–5.0)
Alkaline Phosphatase: 124 U/L (ref 38–126)
Anion gap: 6 (ref 5–15)
BUN: 10 mg/dL (ref 8–23)
CO2: 25 mmol/L (ref 22–32)
Calcium: 10.5 mg/dL — ABNORMAL HIGH (ref 8.9–10.3)
Chloride: 110 mmol/L (ref 98–111)
Creatinine: 0.72 mg/dL (ref 0.44–1.00)
GFR, Estimated: >60 mL/min (ref >60)
Glucose, Bld: 104 mg/dL — ABNORMAL HIGH (ref 70–99)
Potassium: 3.5 mmol/L (ref 3.5–5.1)
Sodium: 141 mmol/L (ref 135–145)
Total Bilirubin: 0.4 mg/dL (ref 0.0–1.2)
Total Protein: 6.9 g/dL (ref 6.5–8.1)

## 2023-11-12 LAB — CBC WITH DIFFERENTIAL (CANCER CENTER ONLY)
Abs Immature Granulocytes: 0.01 10*3/uL (ref 0.00–0.07)
Basophils Absolute: 0.1 10*3/uL (ref 0.0–0.1)
Basophils Relative: 1 %
Eosinophils Absolute: 0.3 10*3/uL (ref 0.0–0.5)
Eosinophils Relative: 5 %
HCT: 37.1 % (ref 36.0–46.0)
Hemoglobin: 11.7 g/dL — ABNORMAL LOW (ref 12.0–15.0)
Immature Granulocytes: 0 %
Lymphocytes Relative: 39 %
Lymphs Abs: 2.6 10*3/uL (ref 0.7–4.0)
MCH: 27.7 pg (ref 26.0–34.0)
MCHC: 31.5 g/dL (ref 30.0–36.0)
MCV: 87.9 fL (ref 80.0–100.0)
Monocytes Absolute: 0.5 10*3/uL (ref 0.1–1.0)
Monocytes Relative: 8 %
Neutro Abs: 3.1 10*3/uL (ref 1.7–7.7)
Neutrophils Relative %: 47 %
Platelet Count: 247 10*3/uL (ref 150–400)
RBC: 4.22 MIL/uL (ref 3.87–5.11)
RDW: 13 % (ref 11.5–15.5)
WBC Count: 6.5 10*3/uL (ref 4.0–10.5)
nRBC: 0 % (ref 0.0–0.2)

## 2023-11-12 MED ORDER — SODIUM CHLORIDE 0.9 % IV SOLN: INTRAVENOUS | Status: DC

## 2023-11-12 MED ORDER — PALONOSETRON HCL INJECTION 0.25 MG/5ML
0.2500 mg | Freq: Once | INTRAVENOUS | Status: AC
  Administered 2023-11-12: 0.25 mg via INTRAVENOUS
  Filled 2023-11-12: qty 5

## 2023-11-12 MED ORDER — SODIUM CHLORIDE 0.9 % IV SOLN: Freq: Once | INTRAVENOUS | Status: DC | PRN

## 2023-11-12 MED ORDER — HEPARIN SOD (PORK) LOCK FLUSH 100 UNIT/ML IV SOLN
500.0000 [IU] | Freq: Once | INTRAVENOUS | Status: AC | PRN
  Administered 2023-11-12: 500 [IU]

## 2023-11-12 MED ORDER — SODIUM CHLORIDE 0.9 % IV SOLN
150.0000 mg | Freq: Once | INTRAVENOUS | Status: DC
  Administered 2023-11-12: 150 mg via INTRAVENOUS
  Filled 2023-11-12: qty 150

## 2023-11-12 MED ORDER — FAMOTIDINE IN NACL 20-0.9 MG/50ML-% IV SOLN: 20.0000 mg | Freq: Once | INTRAVENOUS | Status: DC | PRN

## 2023-11-12 MED ORDER — SODIUM CHLORIDE 0.9 % IV SOLN
600.0000 mg/m2 | Freq: Once | INTRAVENOUS | Status: AC
  Administered 2023-11-12: 1160 mg via INTRAVENOUS
  Filled 2023-11-12: qty 58

## 2023-11-12 MED ORDER — DOXORUBICIN HCL CHEMO IV INJECTION 2 MG/ML
60.0000 mg/m2 | Freq: Once | INTRAVENOUS | Status: AC
  Administered 2023-11-12: 116 mg via INTRAVENOUS
  Filled 2023-11-12: qty 58

## 2023-11-12 MED ORDER — SODIUM CHLORIDE 0.9% FLUSH
10.0000 mL | INTRAVENOUS | Status: DC | PRN
  Administered 2023-11-12: 10 mL

## 2023-11-12 MED ORDER — DEXAMETHASONE SODIUM PHOSPHATE 10 MG/ML IJ SOLN
10.0000 mg | Freq: Once | INTRAMUSCULAR | Status: AC
  Administered 2023-11-12: 10 mg via INTRAVENOUS
  Filled 2023-11-12: qty 1

## 2023-11-12 NOTE — Progress Notes (Signed)
Hypersensitivity Reaction note  Date of event: 11/12/23 Time of event: 0951 Generic name of drug involved: Emend, Name of provider notified of the hypersensitivity reaction: Mosetta Putt MD, Namon Cirri PA.  Was agent that likely caused hypersensitivity reaction added to Allergies List within EMR? Yes. Chain of events including reaction signs/symptoms, treatment administered, and outcome (e.g., drug resumed; drug discontinued; sent to Emergency Department; etc.): At 0951, approximately 1cc into Emend infusion. Pt began c/o chest tightness, and headache. RN discontinued infusion and hung 1L NS open to gravity. Walisiewicz PA called to chairside to assess pt. VSS. Pepcid IV given at 0954. Per MD, Emend permanently discontinued. Pt was able to tolerate remainder of treatment without issue.   Rae Roam, RN 11/12/2023 11:40 AM

## 2023-11-12 NOTE — Patient Instructions (Signed)
[facility] - A DEPT OF [facility]  Discharge Instructions: Thank you for choosing [facility] to provide your oncology and hematology care.   If you have a lab appointment with the Cancer Center, please go directly to the Cancer Center and check in at the registration area.   Wear comfortable clothing and clothing appropriate for easy access to any Portacath or PICC line.   We strive to give you quality time with your provider. You may need to reschedule your appointment if you arrive late (15 or more minutes).  Arriving late affects you and other patients whose appointments are after yours.  Also, if you miss three or more appointments without notifying the office, you may be dismissed from the clinic at the provider's discretion.      For prescription refill requests, have your pharmacy contact our office and allow 72 hours for refills to be completed.    Today you received the following chemotherapy and/or immunotherapy agents Doxorubicin, Cytoxan      To help prevent nausea and vomiting after your treatment, we encourage you to take your nausea medication as directed.  BELOW ARE SYMPTOMS THAT SHOULD BE REPORTED IMMEDIATELY: *FEVER GREATER THAN 100.4 F (38 C) OR HIGHER *CHILLS OR SWEATING *NAUSEA AND VOMITING THAT IS NOT CONTROLLED WITH YOUR NAUSEA MEDICATION *UNUSUAL SHORTNESS OF BREATH *UNUSUAL BRUISING OR BLEEDING *URINARY PROBLEMS (pain or burning when urinating, or frequent urination) *BOWEL PROBLEMS (unusual diarrhea, constipation, pain near the anus) TENDERNESS IN MOUTH AND THROAT WITH OR WITHOUT PRESENCE OF ULCERS (sore throat, sores in mouth, or a toothache) UNUSUAL RASH, SWELLING OR PAIN  UNUSUAL VAGINAL DISCHARGE OR ITCHING   Items with * indicate a potential emergency and should be followed up as soon as possible or go to the Emergency Department if any problems should occur.  Please show the CHEMOTHERAPY ALERT CARD or  IMMUNOTHERAPY ALERT CARD at check-in to the Emergency Department and triage nurse.  Should you have questions after your visit or need to cancel or reschedule your appointment, please contact [facility] - A DEPT OF [facility]  Dept: [phone]  and follow the prompts.  Office hours are 8:00 a.m. to 4:30 p.m. Monday - Friday. Please note that voicemails left after 4:00 p.m. may not be returned until the following business day.  We are closed weekends and major holidays. You have access to a nurse at all times for urgent questions. Please call the main number to the clinic Dept: [phone] and follow the prompts.   For any non-urgent questions, you may also contact your provider using MyChart. We now offer e-Visits for anyone 82 and older to request care online for non-urgent symptoms. For details visit [email or web address].   Also download the MyChart app! Go to the app store, search "MyChart", open the app, select [place], and log in with your MyChart username and password.  Doxorubicin Injection What is this medication? DOXORUBICIN (dox oh ROO bi sin) treats some types of cancer. It works by slowing down the growth of cancer cells. This medicine may be used for other purposes; ask your health care provider or pharmacist if you have questions. COMMON BRAND NAME(S): Adriamycin, Adriamycin PFS, Adriamycin RDF, Rubex What should I tell my care team before I take this medication? They need to know if you have any of these conditions: Heart disease History of low blood cell levels caused by a medication Liver  disease Recent or ongoing radiation An unusual or allergic reaction to doxorubicin, other medications, foods, dyes, or preservatives If you or your partner are pregnant or trying to get pregnant Breast-feeding How should I use this medication? This medication is injected into a vein. It is given by your care team in a hospital or clinic  setting. Talk to your care team about the use of this medication in children. Special care may be needed. Overdosage: If you think you have taken too much of this medicine contact a poison control center or emergency room at once. NOTE: This medicine is only for you. Do not share this medicine with others. What if I miss a dose? Keep appointments for follow-up doses. It is important not to miss your dose. Call your care team if you are unable to keep an appointment. What may interact with this medication? 6-mercaptopurine Paclitaxel Phenytoin St. John's wort Trastuzumab Verapamil This list may not describe all possible interactions. Give your health care provider a list of all the medicines, herbs, non-prescription drugs, or dietary supplements you use. Also tell them if you smoke, drink alcohol, or use illegal drugs. Some items may interact with your medicine. What should I watch for while using this medication? Your condition will be monitored carefully while you are receiving this medication. You may need blood work while taking this medication. This medication may make you feel generally unwell. This is not uncommon as chemotherapy can affect healthy cells as well as cancer cells. Report any side effects. Continue your course of treatment even though you feel ill unless your care team tells you to stop. There is a maximum amount of this medication you should receive throughout your life. The amount depends on the medical condition being treated and your overall health. Your care team will watch how much of this medication you receive. Tell your care team if you have taken this medication before. Your urine may turn red for a few days after your dose. This is not blood. If your urine is dark or brown, call your care team. In some cases, you may be given additional medications to help with side effects. Follow all directions for their use. This medication may increase your risk of getting an  infection. Call your care team for advice if you get a fever, chills, sore throat, or other symptoms of a cold or flu. Do not treat yourself. Try to avoid being around people who are sick. This medication may increase your risk to bruise or bleed. Call your care team if you notice any unusual bleeding. Talk to your care team about your risk of cancer. You may be more at risk for certain types of cancers if you take this medication. Talk to your care team if you or your partner may be pregnant. Serious birth defects can occur if you take this medication during pregnancy and for 6 months after the last dose. Contraception is recommended while taking this medication and for 6 months after the last dose. Your care team can help you find the option that works for you. If your partner can get pregnant, use a condom while taking this medication and for 6 months after the last dose. Do not breastfeed while taking this medication. This medication may cause infertility. Talk to your care team if you are concerned about your fertility. What side effects may I notice from receiving this medication? Side effects that you should report to your care team as soon as possible: Allergic reactions--skin rash,  itching, hives, swelling of the face, lips, tongue, or throat Heart failure--shortness of breath, swelling of the ankles, feet, or hands, sudden weight gain, unusual weakness or fatigue Heart rhythm changes--fast or irregular heartbeat, dizziness, feeling faint or lightheaded, chest pain, trouble breathing Infection--fever, chills, cough, sore throat, wounds that don't heal, pain or trouble when passing urine, general feeling of discomfort or being unwell Low red blood cell level--unusual weakness or fatigue, dizziness, headache, trouble breathing Painful swelling, warmth, or redness of the skin, blisters or sores at the infusion site Unusual bruising or bleeding Side effects that usually do not require medical  attention (report to your care team if they continue or are bothersome): Diarrhea Hair loss Nausea Pain, redness, or swelling with sores inside the mouth or throat Red urine This list may not describe all possible side effects. Call your doctor for medical advice about side effects. You may report side effects to FDA at 1-800-FDA-1088. Where should I keep my medication? This medication is given in a hospital or clinic. It will not be stored at home. NOTE: This sheet is a summary. It may not cover all possible information. If you have questions about this medicine, talk to your doctor, pharmacist, or health care provider.  2024 Elsevier/Gold Standard (2023-01-04 00:00:00)  Cyclophosphamide Injection What is this medication? CYCLOPHOSPHAMIDE (sye kloe FOSS fa mide) treats some types of cancer. It works by slowing down the growth of cancer cells. This medicine may be used for other purposes; ask your health care provider or pharmacist if you have questions. COMMON BRAND NAME(S): Cyclophosphamide, Cytoxan, Neosar What should I tell my care team before I take this medication? They need to know if you have any of these conditions: Heart disease Irregular heartbeat or rhythm Infection Kidney problems Liver disease Low blood cell levels (white cells, platelets, or red blood cells) Lung disease Previous radiation Trouble passing urine An unusual or allergic reaction to cyclophosphamide, other medications, foods, dyes, or preservatives Pregnant or trying to get pregnant Breast-feeding How should I use this medication? This medication is injected into a vein. It is given by your care team in a hospital or clinic setting. Talk to your care team about the use of this medication in children. Special care may be needed. Overdosage: If you think you have taken too much of this medicine contact a poison control center or emergency room at once. NOTE: This medicine is only for you. Do not share  this medicine with others. What if I miss a dose? Keep appointments for follow-up doses. It is important not to miss your dose. Call your care team if you are unable to keep an appointment. What may interact with this medication? Amphotericin B Amiodarone Azathioprine Certain antivirals for HIV or hepatitis Certain medications for blood pressure, such as enalapril, lisinopril, quinapril Cyclosporine Diuretics Etanercept Indomethacin Medications that relax muscles Metronidazole Natalizumab Tamoxifen Warfarin This list may not describe all possible interactions. Give your health care provider a list of all the medicines, herbs, non-prescription drugs, or dietary supplements you use. Also tell them if you smoke, drink alcohol, or use illegal drugs. Some items may interact with your medicine. What should I watch for while using this medication? This medication may make you feel generally unwell. This is not uncommon as chemotherapy can affect healthy cells as well as cancer cells. Report any side effects. Continue your course of treatment even though you feel ill unless your care team tells you to stop. You may need blood work while you  are taking this medication. This medication may increase your risk of getting an infection. Call your care team for advice if you get a fever, chills, sore throat, or other symptoms of a cold or flu. Do not treat yourself. Try to avoid being around people who are sick. Avoid taking medications that contain aspirin, acetaminophen, ibuprofen, naproxen, or ketoprofen unless instructed by your care team. These medications may hide a fever. Be careful brushing or flossing your teeth or using a toothpick because you may get an infection or bleed more easily. If you have any dental work done, tell your dentist you are receiving this medication. Drink water or other fluids as directed. Urinate often, even at night. Some products may contain alcohol. Ask your care team  if this medication contains alcohol. Be sure to tell all care teams you are taking this medicine. Certain medicines, like metronidazole and disulfiram, can cause an unpleasant reaction when taken with alcohol. The reaction includes flushing, headache, nausea, vomiting, sweating, and increased thirst. The reaction can last from 30 minutes to several hours. Talk to your care team if you wish to become pregnant or think you might be pregnant. This medication can cause serious birth defects if taken during pregnancy and for 1 year after the last dose. A negative pregnancy test is required before starting this medication. A reliable form of contraception is recommended while taking this medication and for 1 year after the last dose. Talk to your care team about reliable forms of contraception. Do not father a child while taking this medication and for 4 months after the last dose. Use a condom during this time period. Do not breast-feed while taking this medication or for 1 week after the last dose. This medication may cause infertility. Talk to your care team if you are concerned about your fertility. Talk to your care team about your risk of cancer. You may be more at risk for certain types of cancer if you take this medication. What side effects may I notice from receiving this medication? Side effects that you should report to your care team as soon as possible: Allergic reactions--skin rash, itching, hives, swelling of the face, lips, tongue, or throat Dry cough, shortness of breath or trouble breathing Heart failure--shortness of breath, swelling of the ankles, feet, or hands, sudden weight gain, unusual weakness or fatigue Heart muscle inflammation--unusual weakness or fatigue, shortness of breath, chest pain, fast or irregular heartbeat, dizziness, swelling of the ankles, feet, or hands Heart rhythm changes--fast or irregular heartbeat, dizziness, feeling faint or lightheaded, chest pain, trouble  breathing Infection--fever, chills, cough, sore throat, wounds that don't heal, pain or trouble when passing urine, general feeling of discomfort or being unwell Kidney injury--decrease in the amount of urine, swelling of the ankles, hands, or feet Liver injury--right upper belly pain, loss of appetite, nausea, light-colored stool, dark yellow or brown urine, yellowing skin or eyes, unusual weakness or fatigue Low red blood cell level--unusual weakness or fatigue, dizziness, headache, trouble breathing Low sodium level--muscle weakness, fatigue, dizziness, headache, confusion Red or dark brown urine Unusual bruising or bleeding Side effects that usually do not require medical attention (report to your care team if they continue or are bothersome): Hair loss Irregular menstrual cycles or spotting Loss of appetite Nausea Pain, redness, or swelling with sores inside the mouth or throat Vomiting This list may not describe all possible side effects. Call your doctor for medical advice about side effects. You may report side effects to FDA at  1-800-FDA-1088. Where should I keep my medication? This medication is given in a hospital or clinic. It will not be stored at home. NOTE: This sheet is a summary. It may not cover all possible information. If you have questions about this medicine, talk to your doctor, pharmacist, or health care provider.  2024 Elsevier/Gold Standard (2022-02-17 00:00:00)

## 2023-11-12 NOTE — Progress Notes (Signed)
    DATE:  11/12/23                                        X MEDICATION REACTION          MD: Mosetta Putt      AGENT/BLOOD PRODUCT RECEIVING IMMEDIATELY PRIOR TO REACTION:          Emend    Vitals:   11/12/23 0954 11/12/23 0957  BP: (!) 125/59 (!) 118/57  Pulse: 64 62  Resp: 18 18  SpO2: 100% 100%      REACTION(S):           chest tightness, headache and diaphoresis   PREMEDS:   Aloxi 0.25 mg IV, Decadron 10 mg IV   INTERVENTION: Pepcid 20 mg IV, normal saline   Review of Systems  Review of Systems  Constitutional:  Positive for diaphoresis.  Respiratory:  Positive for shortness of breath.   Neurological:  Positive for headaches.  All other systems reviewed and are negative.    Physical Exam  Physical Exam Vitals and nursing note reviewed.  Constitutional:      Appearance: She is not ill-appearing or toxic-appearing.  HENT:     Head: Normocephalic.  Eyes:     Conjunctiva/sclera: Conjunctivae normal.  Cardiovascular:     Rate and Rhythm: Normal rate and regular rhythm.     Pulses: Normal pulses.     Heart sounds: Normal heart sounds.  Pulmonary:     Effort: Pulmonary effort is normal.     Breath sounds: Normal breath sounds.  Abdominal:     General: There is no distension.  Musculoskeletal:     Cervical back: Normal range of motion.  Skin:    General: Skin is warm and dry.  Neurological:     General: No focal deficit present.     Mental Status: She is alert and oriented to person, place, and time.     OUTCOME:                  Patient became symptomatic after receiving approximately 1 ml of Emend. Emergency medications were administered as documented above. Patient returned to baseline. Oncologist notified and will discontinue Emend from the treatment plan. Patient able to proceed with treatment today.

## 2023-11-12 NOTE — Progress Notes (Signed)
Removed emend from treatment plan as patient had infusion reaction with this medication. Recovered and was able to complete initial treatment.

## 2023-11-13 ENCOUNTER — Telehealth: Payer: Self-pay | Admitting: *Deleted

## 2023-11-13 NOTE — Telephone Encounter (Signed)
-----   Message from Nurse Threasa Beards sent at 11/12/2023 11:43 AM EST ----- Regarding: Dr Mosetta Putt pt, first time Doxorubicin, Cytoxan DR Mosetta Putt pt came in 1/27 for first time Doxorubicin and Cytoxan infusion. Had allergic reaction to Emend which resolved quickly with interventions. Tolerated remainder of medications well. Needs call back.

## 2023-11-13 NOTE — Telephone Encounter (Signed)
Called pt to see how she did with her recent treatment.  She reports mild waves of nausea but eating & drinking ok.  Reminded to take nausea meds if needed.  She denies any other symptoms.  She knows her next appts & how to reach [place] if needed.  She is coming in tomorrow for her Pegfilgrastim & knows to take her claritin prior.

## 2023-11-14 ENCOUNTER — Inpatient Hospital Stay: Payer: 59

## 2023-11-14 VITALS — BP 117/57 | HR 61 | Resp 18

## 2023-11-14 DIAGNOSIS — Z1732 Human epidermal growth factor receptor 2 negative status: Secondary | ICD-10-CM | POA: Diagnosis not present

## 2023-11-14 DIAGNOSIS — C50412 Malignant neoplasm of upper-outer quadrant of left female breast: Secondary | ICD-10-CM | POA: Diagnosis not present

## 2023-11-14 DIAGNOSIS — M8580 Other specified disorders of bone density and structure, unspecified site: Secondary | ICD-10-CM | POA: Diagnosis not present

## 2023-11-14 DIAGNOSIS — C50912 Malignant neoplasm of unspecified site of left female breast: Secondary | ICD-10-CM

## 2023-11-14 DIAGNOSIS — R110 Nausea: Secondary | ICD-10-CM | POA: Diagnosis not present

## 2023-11-14 DIAGNOSIS — Z5111 Encounter for antineoplastic chemotherapy: Secondary | ICD-10-CM | POA: Diagnosis not present

## 2023-11-14 DIAGNOSIS — C773 Secondary and unspecified malignant neoplasm of axilla and upper limb lymph nodes: Secondary | ICD-10-CM | POA: Diagnosis not present

## 2023-11-14 DIAGNOSIS — Z1721 Progesterone receptor positive status: Secondary | ICD-10-CM | POA: Diagnosis not present

## 2023-11-14 DIAGNOSIS — I10 Essential (primary) hypertension: Secondary | ICD-10-CM | POA: Diagnosis not present

## 2023-11-14 DIAGNOSIS — Z170 Estrogen receptor positive status [ER+]: Secondary | ICD-10-CM | POA: Diagnosis not present

## 2023-11-14 DIAGNOSIS — Z5189 Encounter for other specified aftercare: Secondary | ICD-10-CM | POA: Diagnosis not present

## 2023-11-14 DIAGNOSIS — Z79899 Other long term (current) drug therapy: Secondary | ICD-10-CM | POA: Diagnosis not present

## 2023-11-14 MED ORDER — PEGFILGRASTIM-CBQV 6 MG/0.6ML ~~LOC~~ SOSY
6.0000 mg | PREFILLED_SYRINGE | Freq: Once | SUBCUTANEOUS | Status: AC
  Administered 2023-11-14: 6 mg via SUBCUTANEOUS
  Filled 2023-11-14: qty 0.6

## 2023-11-15 ENCOUNTER — Encounter: Payer: Self-pay | Admitting: Hematology

## 2023-11-15 ENCOUNTER — Encounter (HOSPITAL_COMMUNITY): Payer: Self-pay | Admitting: Student-PharmD

## 2023-11-17 ENCOUNTER — Encounter: Payer: Self-pay | Admitting: Hematology

## 2023-11-19 ENCOUNTER — Ambulatory Visit (INDEPENDENT_AMBULATORY_CARE_PROVIDER_SITE_OTHER): Payer: 59 | Admitting: Student

## 2023-11-19 ENCOUNTER — Encounter: Payer: Self-pay | Admitting: Student

## 2023-11-19 VITALS — BP 127/79 | HR 70 | Ht 62.0 in | Wt 188.2 lb

## 2023-11-19 DIAGNOSIS — R4589 Other symptoms and signs involving emotional state: Secondary | ICD-10-CM | POA: Diagnosis not present

## 2023-11-19 DIAGNOSIS — Z1211 Encounter for screening for malignant neoplasm of colon: Secondary | ICD-10-CM

## 2023-11-19 DIAGNOSIS — I10 Essential (primary) hypertension: Secondary | ICD-10-CM | POA: Diagnosis not present

## 2023-11-19 DIAGNOSIS — Z131 Encounter for screening for diabetes mellitus: Secondary | ICD-10-CM

## 2023-11-19 LAB — POCT GLYCOSYLATED HEMOGLOBIN (HGB A1C): Hemoglobin A1C: 5.1 % (ref 4.0–5.6)

## 2023-11-19 NOTE — Progress Notes (Signed)
 Patient Care Team: [name], DO as PCP - General (Family Medicine) [name], MD as PCP - Cardiology (Cardiology) [name], MD (Inactive) as Consulting Physician (Neurology) [name], MD as Consulting Physician (Hematology and Oncology)  Clinic Day:  11/20/2023  Referring physician: [name], DO  ASSESSMENT & PLAN:   Assessment & Plan: Malignant neoplasm of upper-outer quadrant of left breast in female, estrogen receptor positive (HCC) -pT3N3aMx, at least stage IIIA, ER+/PR+/HER2- -patient presented with a palpable left breast mass, ultrasound showed multiple enlarged left axillary lymph node. -Due to her underlying dementia, she underwent upfront surgery.  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 -Recommend CT and bone scan to rule out distant metastasis -The high risk of recurrence due to large number of positive lymph node was discussed, and the benefit of adjuvant chemotherapy, followed by adjuvant radiation, aromatase inhibitor and CDK4/6 inhibitor  --Chemotherapy consent: Side effects including but does not not limited to, fatigue, nausea, vomiting, diarrhea, hair loss, neuropathy, fluid retention, renal and kidney dysfunction, neutropenic fever, needed for blood transfusion, bleeding, cognitive deterioration from chemotherapy, were discussed with patient in great detail. She agrees to proceed. -Restaging CT chest abdomen pelvis was completed on 10/31/2023 showing no evidence of malignant lymphadenopathy and no evidence of recurrence or metastases in the chest abdomen pelvis.  Whole-body bone scan done 10/31/2023 showed no evidence of skeletal metastases. -11/08/2023 - echocardiogram showing normal LVG at 55 - 60%. No wall motion abnormality. Mild left ventricular hypertrophy with grade 1 diastolic dysfunction. No pericardial effusion. Cannot rule out small PFO.  -11/09/2023 - chemotherapy education completed  11/09/2023. -11/12/2023 - today is cycle 1 day 1 AC-T.  -11/20/2023 -toxicity check from initial chemotherapy with Adriamycin  and Cytoxan .  Overall, she has tolerated chemotherapy well.  Did have reaction to IV medication, amend.  Changed to Aloxi  and Decadron .  Was able to complete cycle 1 without other problems.  Labs showed mild anemia and are stable otherwise.  She will return on 11/26/2023 for cycle 2 day 1 of Adriamycin  and Cytoxan .   Plan: Labs reviewed  -CBC showing WBC 3.9; Hgb 10.8; Hct 33.1; Plt 132; Anc 1.6 -CMP - K 3.7; glucose 122; BUN 7; Creatinine 0.69; eGFR >60; Ca 10.2; LFTs normal.   Overall, doing well after initial chemotherapy with adriamycin  and cytoxan .  Encouraged her to consume high protein and high calorie foods, having 5 to 6 small meals per day.  Will see her back in 1 week for labs/flush, follow up, and cycle 2 day 1 chemotherapy with adriamycin  and cytoxan .   The patient understands the plans discussed today and is in agreement with them.  She knows to contact our office if she develops concerns prior to her next appointment.  I provided 20 minutes of face-to-face time during this encounter and > 50% was spent counseling as documented under my assessment and plan.    Beverly FORBES Lessen, [name]  [facility] - A DEPT OF [facility] [address] [place] [address] Dept: [phone] Dept Fax: [phone]   No orders of the defined types were placed in this encounter.     CHIEF COMPLAINT:  CC: Left breast cancer, estrogen receptor positive  Current Treatment: Adjuvant chemotherapy with AC-T  INTERVAL HISTORY:  [name] is here today for repeat clinical assessment.  She was last seen by me on 11/12/2023.  First treatment of chemotherapy was 11/12/2023.  She did have a reaction to Emend antiemetic at initial treatment.  Chemotherapy premeds have been changed to Aloxi  0.25 mg and Decadron  10 mg, both  IV.  She was able to complete the Adriamycin  and Cytoxan  without issue.  Today is toxicity check. She reports decreased appetite. For first few days, she had bad taste in her mouth. This gradually improved. She states that she does get hungry and she is able to eat, she just has to eat smaller amounts of food at a time. She did have 1 day when she experienced some nausea and had to take prescription antiemetic. She has some fatigue. She also reports some memory slowness, however, this is baseline, and was occurring prior to starting chemotherapy. She denies chest pain, chest pressure, or shortness of breath. She denies headaches or visual disturbances. She denies abdominal pain, nausea, vomiting, or changes in bowel or bladder habits.   She denies fevers or chills. She denies pain. Her weight has decreased 4 pounds over last 1.5 weeks .  I have reviewed the past medical history, past surgical history, social history and family history with the patient and they are unchanged from previous note.  ALLERGIES:  is allergic to [facility] [aprepitant], hydrochlorothiazide , hydrocodone , and simvastatin.  MEDICATIONS:  Current Outpatient Medications  Medication Sig Dispense Refill   albuterol  (VENTOLIN  HFA) 108 (90 Base) MCG/ACT inhaler USE 2 INHALATIONS BY MOUTH EVERY 6 HOURS AS NEEDED FOR WHEEZING  OR SHORTNESS OF BREATH (Patient taking differently: Inhale 2 puffs into the lungs every 6 (six) hours as needed for wheezing or shortness of breath.) 26.8 g 2   amLODipine  (NORVASC ) 10 MG tablet TAKE 1 TABLET BY MOUTH EVERY  MORNING 100 tablet 2   aspirin  EC 81 MG tablet Take 81 mg by mouth daily. Swallow whole.     Budesonide  90 MCG/ACT inhaler Inhale 1 puff into the lungs 2 (two) times daily. 1 each 0   capsaicin  (ZOSTRIX) 0.025 % cream Apply topically 2 (two) times daily. 60 g 0   cholecalciferol (VITAMIN D3) 25 MCG (1000 UNIT) tablet Take 1 tablet (1,000 Units total) by mouth daily. 7 tablet 0   Cholecalciferol  (VITAMIN D3) 50 MCG (2000 UT) CHEW Chew 2,000 Units by mouth daily.     donepezil (ARICEPT) 10 MG tablet Take 10 mg by mouth at bedtime.     fluticasone  (FLONASE ) 50 MCG/ACT nasal spray USE 1 SPRAY IN BOTH NOSTRILS  DAILY (Patient taking differently: Place 1 spray into both nostrils daily as needed for allergies or rhinitis.) 16 g 0   fluticasone  (FLOVENT  HFA) 110 MCG/ACT inhaler Inhale 1 puff into the lungs daily as needed. (Patient taking differently: Inhale 1 puff into the lungs daily as needed (for flares).) 1 each 12   gabapentin  (NEURONTIN ) 100 MG capsule TAKE 1 CAPSULE BY MOUTH DAILY 90 capsule 3   lidocaine -prilocaine  (EMLA ) cream Apply to affected area once 30 g 3   loratadine  (EQ ALLERGY RELIEF) 10 MG tablet Take 1 tablet (10 mg total) by mouth daily. (Patient taking differently: Take 10 mg by mouth daily as needed for allergies or rhinitis.) 90 tablet 3   losartan  (COZAAR ) 25 MG tablet Take 50 mg by mouth at bedtime.     losartan  (COZAAR ) 50 MG tablet Take 1 tablet (50 mg total) by mouth at bedtime. 90 tablet 3   meloxicam  (MOBIC ) 15 MG tablet Take 1 tablet by mouth once daily (Patient taking differently: Take 15 mg by mouth daily as needed for pain.) 30  tablet 0   metoprolol  tartrate (LOPRESSOR ) 50 MG tablet TAKE 1 TABLET BY MOUTH TWICE  DAILY (Patient taking differently: Take 50 mg by mouth in the morning and at bedtime.) 200 tablet 2   ondansetron  (ZOFRAN ) 8 MG tablet Take 1 tab (8 mg) by mouth every 8 hrs as needed for nausea/vomiting. Start third day after doxorubicin /cyclophosphamide  chemotherapy. 30 tablet 1   ondansetron  (ZOFRAN -ODT) 4 MG disintegrating tablet Take 4 mg by mouth every 8 (eight) hours as needed for nausea or vomiting (dissolve orally).     prochlorperazine  (COMPAZINE ) 10 MG tablet Take 1 tablet (10 mg total) by mouth every 6 (six) hours as needed for nausea or vomiting. 30 tablet 1   rosuvastatin  (CRESTOR ) 5 MG tablet Take one tablet by mouth every Monday and  Friday. (Patient taking differently: Take 5 mg by mouth See admin instructions. Take 5 mg by mouth every Monday and Friday) 20 tablet 4   sertraline  (ZOLOFT ) 50 MG tablet Take 1 tablet (50 mg total) by mouth daily. 30 tablet 0   traMADol  (ULTRAM ) 50 MG tablet Take 1 tablet (50 mg total) by mouth every 6 (six) hours as needed for moderate pain (pain score 4-6) or severe pain (pain score 7-10). 25 tablet 0   TYLENOL  500 MG tablet Take 500-1,000 mg by mouth every 6 (six) hours as needed for mild pain (pain score 1-3) or headache.     No current facility-administered medications for this visit.    HISTORY OF PRESENT ILLNESS:   Oncology History  Infiltrating ductal carcinoma of breast (HCC)  09/22/2023 Initial Diagnosis   Infiltrating ductal carcinoma of breast (HCC)   11/12/2023 -  Chemotherapy   Patient is on Treatment Plan : BREAST DOSE DENSE AC q14d / PACLitaxel  q7d     Malignant neoplasm of upper-outer quadrant of left breast in female, estrogen receptor positive (HCC)  09/28/2023 Initial Diagnosis   Malignant neoplasm of upper-outer quadrant of left breast in female, estrogen receptor positive (HCC)   09/28/2023 Cancer Staging   Staging form: Breast, AJCC 8th Edition - Clinical stage from 09/28/2023: Stage IIA (cT2, cN1, cM0, G2, ER+, PR+, HER2-) - Signed by [name], MD on 09/28/2023 Histologic grading system: 3 grade system   10/04/2023 Cancer Staging   Staging form: Breast, AJCC 8th Edition - Pathologic stage from 10/04/2023: Stage IIIA (pT2, pN3a, cM0, G2, ER+, PR+, HER2-) - Signed by [name], MD on 10/19/2023 Histologic grading system: 3 grade system Residual tumor (R): R0 - None   10/31/2023 Imaging   NM whole body bone scan IMPRESSION: No evidence skeletal metastasis.   10/31/2023 Imaging   CT chest abdomen and pelvis with contrast  IMPRESSION: 1. Status post lumpectomy of the upper outer left breast and left axillary lymph node dissection with associated simple  attenuation fluid collections, most consistent with postoperative seromas. 2. Enlarged left axillary lymph node measuring 1.0 x 0.8 cm, nonspecific and possibly reactive in the setting of recent surgery. Correlate with tissue findings of lymph node dissection. Attention on follow-up. 3. No other evidence of lymphadenopathy or metastatic disease in the chest, abdomen, or pelvis. 4. Nonobstructive bilateral nephrolithiasis.   11/08/2023 Echocardiogram   Echocardiogram FINDINGS   Left Ventricle: Left ventricular ejection fraction, by estimation, is 55 to 60%. Left ventricular ejection fraction by 3D volume is 56 %. The left ventricle has normal function. The left ventricle has no regional wall motion abnormalities. Global longitudinal strain performed but not reported based on interpreter judgement due to suboptimal  tracking. The left ventricular internal cavity size was mildly dilated. There is mild left ventricular hypertrophy. Left ventricular diastolic parameters are consistent with Grade I diastolic dysfunction (impaired relaxation). Indeterminate filling pressures.  Right Ventricle: The right ventricular size is normal. No increase in right ventricular wall thickness. Right ventricular systolic function is  low normal. There is normal pulmonary artery systolic pressure. The tricuspid regurgitant velocity is 2.12 m/s,  and with an assumed right atrial pressure of 3 mmHg, the estimated right ventricular systolic pressure is 21.0 mmHg.  Left Atrium: Left atrial size was normal in size.  Right Atrium: Right atrial size was normal in size.  Pericardium: There is no evidence of pericardial effusion.  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 No evidence of mitral valve stenosis.  Tricuspid Valve: The tricuspid valve is normal in structure. Tricuspid valve regurgitation is mild . No evidence of tricuspid stenosis.  Aortic Valve: The aortic valve is tricuspid. Aortic  valve regurgitation is not visualized. No aortic stenosis is present.  Pulmonic Valve: The pulmonic valve was normal in structure. Pulmonic valve regurgitation is mild. No evidence of pulmonic stenosis.  Aorta: The aortic root and ascending aorta are structurally normal, with no evidence of dilitation.  Venous: The inferior vena cava is normal in size with greater than 50% respiratory variability, suggesting right atrial pressure of 3 mmHg.  IAS/Shunts: There is redundancy of the interatrial septum. Cannot exclude a small PFO.         REVIEW OF SYSTEMS:   Constitutional: Denies fevers or chills.  Has noticed some decreased appetite.  She does get hungry but gets full much faster.  Has had a 4 pound weight loss since initial treatment. Eyes: Denies blurriness of vision Ears, nose, mouth, throat, and face: Denies mucositis or sore throat Respiratory: Denies cough, dyspnea or wheezes Cardiovascular: Denies palpitation, chest discomfort or lower extremity swelling Gastrointestinal:  Denies nausea, heartburn or change in bowel habits Skin: Denies abnormal skin rashes Lymphatics: Denies new lymphadenopathy or easy bruising Neurological:Denies numbness, tingling or new weaknesses Behavioral/Psych: Mood is stable, no new changes  All other systems were reviewed with the patient and are negative.   VITALS:   Today's Vitals   11/20/23 1011 11/20/23 1018 11/20/23 1019  BP: (!) 140/74 132/80   Pulse: 72    Resp: 18    Temp: (!) 97.3 F (36.3 C)    TempSrc: Temporal    SpO2: 100%    Weight: 188 lb 2 oz (85.3 kg)    PainSc:   0-No pain   Body mass index is 34.41 kg/m.   Wt Readings from Last 3 Encounters:  11/20/23 188 lb 2 oz (85.3 kg)  11/19/23 188 lb 3.2 oz (85.4 kg)  11/12/23 192 lb (87.1 kg)    Body mass index is 34.41 kg/m.  Performance status (ECOG): 1 - Symptomatic but completely ambulatory  PHYSICAL EXAM:   GENERAL:alert, no distress and comfortable SKIN: skin color,  texture, turgor are normal, no rashes or significant lesions EYES: normal, Conjunctiva are pink and non-injected, sclera clear OROPHARYNX:no exudate, no erythema and lips, buccal mucosa, and tongue normal  NECK: supple, thyroid  normal size, non-tender, without nodularity LYMPH:  no palpable lymphadenopathy in the cervical, axillary or inguinal LUNGS: clear to auscultation and percussion with normal breathing effort HEART: regular rate & rhythm and no murmurs and no lower extremity edema ABDOMEN:abdomen soft, non-tender and normal bowel sounds Musculoskeletal:no cyanosis of digits and no clubbing  NEURO: alert &  oriented x 3 with fluent speech, no focal motor/sensory deficits  LABORATORY DATA:  I have reviewed the data as listed    Component Value Date/Time   NA 141 11/20/2023 0941   NA 141 12/29/2021 0953   K 3.7 11/20/2023 0941   CL 109 11/20/2023 0941   CO2 26 11/20/2023 0941   GLUCOSE 122 (H) 11/20/2023 0941   BUN 7 (L) 11/20/2023 0941   BUN 7 (L) 12/29/2021 0953   CREATININE 0.69 11/20/2023 0941   CREATININE 0.75 05/03/2015 1559   CALCIUM  10.2 11/20/2023 0941   PROT 6.4 (L) 11/20/2023 0941   PROT 6.9 11/30/2021 1040   ALBUMIN 3.8 11/20/2023 0941   ALBUMIN 4.2 11/30/2021 1040   AST 10 (L) 11/20/2023 0941   ALT 10 11/20/2023 0941   ALKPHOS 122 11/20/2023 0941   BILITOT 0.3 11/20/2023 0941   GFRNONAA >60 11/20/2023 0941   GFRNONAA >89 01/15/2014 1509   GFRAA 95 10/12/2020 0825   GFRAA >89 01/15/2014 1509    Lab Results  Component Value Date   WBC 3.9 (L) 11/20/2023   NEUTROABS 1.6 (L) 11/20/2023   HGB 10.8 (L) 11/20/2023   HCT 33.1 (L) 11/20/2023   MCV 86.9 11/20/2023   PLT 132 (L) 11/20/2023    RADIOGRAPHIC STUDIES: ECHOCARDIOGRAM COMPLETE Result Date: 11/08/2023    ECHOCARDIOGRAM REPORT   Patient Name:   GENAE STRINE Date of Exam: 11/08/2023 Medical Rec #:  [id number]       Height:       62.0 in Accession #:    [id number]      Weight:       200.0 lb Date of  Birth:  1955/10/25       BSA:          1.912 m Patient Age:    67 years        BP:           135/92 mmHg Patient Gender: F               HR:           72 bpm. Exam Location:  Outpatient Procedure: 2D Echo, 3D Echo, Cardiac Doppler, Color Doppler and Strain Analysis Indications:    Z51.11 Encounter for antineoplastic chemotheraphy  History:        Patient has prior history of Echocardiogram examinations, most                 recent 09/19/2019. Signs/Symptoms:Dizziness/Lightheadedness; Risk                 Factors:Hypertension and Dyslipidemia. Breast cancer.  Sonographer:    Ellouise Mose RDCS Referring Phys: [id number] ONITA MATTOCK IMPRESSIONS  1. Left ventricular ejection fraction, by estimation, is 55 to 60%. Left ventricular ejection fraction by 3D volume is 56 %. The left ventricle has normal function. The left ventricle has no regional wall motion abnormalities. The left ventricular internal cavity size was mildly dilated. There is mild left ventricular hypertrophy. Left ventricular diastolic parameters are consistent with Grade I diastolic dysfunction (impaired relaxation).  2. Right ventricular systolic function is low normal. The right ventricular size is normal. There is normal pulmonary artery systolic pressure. The estimated right ventricular systolic pressure is 21.0 mmHg.  3. The mitral valve is myxomatous. Mild mitral valve regurgitation. No evidence of mitral stenosis.  4. The aortic valve is tricuspid. Aortic valve regurgitation is not visualized. No aortic stenosis is present.  5. The inferior vena cava is normal in size  with greater than 50% respiratory variability, suggesting right atrial pressure of 3 mmHg.  6. Cannot exclude a small PFO. Comparison(s): No significant change from prior study. 09/19/2019: LVEF 55-60%. FINDINGS  Left Ventricle: Left ventricular ejection fraction, by estimation, is 55 to 60%. Left ventricular ejection fraction by 3D volume is 56 %. The left ventricle has normal function. The  left ventricle has no regional wall motion abnormalities. Global longitudinal strain performed but not reported based on interpreter judgement due to suboptimal tracking. The left ventricular internal cavity size was mildly dilated. There is mild left ventricular hypertrophy. Left ventricular diastolic parameters are consistent with Grade I diastolic dysfunction (impaired relaxation). Indeterminate filling pressures. Right Ventricle: The right ventricular size is normal. No increase in right ventricular wall thickness. Right ventricular systolic function is low normal. There is normal pulmonary artery systolic pressure. The tricuspid regurgitant velocity is 2.12 m/s,  and with an assumed right atrial pressure of 3 mmHg, the estimated right ventricular systolic pressure is 21.0 mmHg. Left Atrium: Left atrial size was normal in size. Right Atrium: Right atrial size was normal in size. Pericardium: There is no evidence of pericardial effusion. 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 No evidence of mitral valve stenosis. Tricuspid Valve: The tricuspid valve is normal in structure. Tricuspid valve regurgitation is mild . No evidence of tricuspid stenosis. Aortic Valve: The aortic valve is tricuspid. Aortic valve regurgitation is not visualized. No aortic stenosis is present. Pulmonic Valve: The pulmonic valve was normal in structure. Pulmonic valve regurgitation is mild. No evidence of pulmonic stenosis. Aorta: The aortic root and ascending aorta are structurally normal, with no evidence of dilitation. Venous: The inferior vena cava is normal in size with greater than 50% respiratory variability, suggesting right atrial pressure of 3 mmHg. IAS/Shunts: There is redundancy of the interatrial septum. Cannot exclude a small PFO.  LEFT VENTRICLE PLAX 2D LVIDd:         4.05 cm         Diastology LVIDs:         3.00 cm         LV e' medial:    4.68 cm/s LV PW:         1.10 cm         LV  E/e' medial:  12.5 LV IVS:        1.05 cm         LV e' lateral:   6.42 cm/s LVOT diam:     2.20 cm         LV E/e' lateral: 9.1 LV SV:         70 LV SV Index:   37 LVOT Area:     3.80 cm        3D Volume EF                                LV 3D EF:    Left                                             ventricul LV Volumes (MOD)                            ar LV vol d, MOD  86.1 ml                    ejection A2C:                                        fraction LV vol d, MOD    96.7 ml                    by 3D A4C:                                        volume is LV vol s, MOD    38.9 ml                    56 %. A2C: LV vol s, MOD    42.5 ml A4C:                           3D Volume EF: LV SV MOD A2C:   47.2 ml       3D EF:        56 % LV SV MOD A4C:   96.7 ml       LV EDV:       115 ml LV SV MOD BP:    52.4 ml       LV ESV:       51 ml                                LV SV:        65 ml RIGHT VENTRICLE            IVC RV S prime:     9.68 cm/s  IVC diam: 1.70 cm TAPSE (M-mode): 1.9 cm LEFT ATRIUM             Index        RIGHT ATRIUM          Index LA diam:        2.60 cm 1.36 cm/m   RA Area:     6.66 cm LA Vol (A2C):   34.6 ml 18.10 ml/m  RA Volume:   9.00 ml  4.71 ml/m LA Vol (A4C):   36.1 ml 18.88 ml/m LA Biplane Vol: 34.9 ml 18.26 ml/m  AORTIC VALVE             PULMONIC VALVE LVOT Vmax:   93.30 cm/s  PR End Diast Vel: 1.90 msec LVOT Vmean:  59.700 cm/s LVOT VTI:    0.184 m  AORTA Ao Root diam: 3.00 cm Ao Asc diam:  3.50 cm MITRAL VALVE               TRICUSPID VALVE MV Area (PHT): 3.17 cm    TR Peak grad:   18.0 mmHg MV Decel Time: 239 msec    TR Vmax:        212.00 cm/s MV E velocity: 58.70 cm/s MV A velocity: 94.30 cm/s  SHUNTS MV E/A ratio:  0.62        Systemic VTI:  0.18 m  Systemic Diam: 2.20 cm Vinie Maxcy MD Electronically signed by Vinie Maxcy MD Signature Date/Time: 11/08/2023/2:52:52 PM    Final    IR IMAGING GUIDED PORT INSERTION Result Date: 11/06/2023 CLINICAL  DATA:  Left breast carcinoma and need for porta cath to begin chemotherapy. EXAM: IMPLANTED PORT A CATH PLACEMENT WITH ULTRASOUND AND FLUOROSCOPIC GUIDANCE ANESTHESIA/SEDATION: Moderate (conscious) sedation was employed during this procedure. A total of Versed  1.5 mg and Fentanyl  100 mcg was administered intravenously. Moderate Sedation Time: 36 minutes. The patient's level of consciousness and vital signs were monitored continuously by radiology nursing throughout the procedure under my direct supervision. FLUOROSCOPY: 24 seconds.  2.0 mGy. PROCEDURE: The procedure, risks, benefits, and alternatives were explained to the patient. Questions regarding the procedure were encouraged and answered. The patient understands and consents to the procedure. A time-out was performed prior to initiating the procedure. Ultrasound was utilized to confirm patency of the right internal jugular vein. No ultrasound image was saved and recorded. The right neck and chest were prepped with chlorhexidine  in a sterile fashion, and a sterile drape was applied covering the operative field. Maximum barrier sterile technique with sterile gowns and gloves were used for the procedure. Local anesthesia was provided with 1% lidocaine . After creating a small venotomy incision, a 21 gauge needle was advanced into the right internal jugular vein under direct, real-time ultrasound guidance. Ultrasound image documentation was performed. After securing guidewire access, an 8 Fr dilator was placed. A J-wire was kinked to measure appropriate catheter length. A subcutaneous port pocket was then created along the upper chest wall utilizing sharp and blunt dissection. Portable cautery was utilized. The pocket was irrigated with sterile saline. A single lumen power injectable port was chosen for placement. The 8 Fr catheter was tunneled from the port pocket site to the venotomy incision. The port was placed in the pocket. External catheter was trimmed to  appropriate length based on guidewire measurement. At the venotomy, an 8 Fr peel-away sheath was placed over a guidewire. The catheter was then placed through the sheath and the sheath removed. Final catheter positioning was confirmed and documented with a fluoroscopic spot image. The port was accessed with a needle and aspirated and flushed with heparinized saline. The access needle was removed. The venotomy and port pocket incisions were closed with subcutaneous 3-0 Monocryl and subcuticular 4-0 Vicryl. Dermabond was applied to both incisions. COMPLICATIONS: COMPLICATIONS None FINDINGS: After catheter placement, the tip lies at the cavo-atrial junction. The catheter aspirates normally and is ready for immediate use. IMPRESSION: Placement of single lumen port a cath via right internal jugular vein. The catheter tip lies at the cavo-atrial junction. A power injectable port a cath was placed and is ready for immediate use. Electronically Signed   By: Marcey Moan M.D.   On: 11/06/2023 10:00   NM Bone Scan Whole Body Result Date: 11/02/2023 CLINICAL DATA:  LEFT breast carcinoma. EXAM: NUCLEAR MEDICINE WHOLE BODY BONE SCAN TECHNIQUE: Whole body anterior and posterior images were obtained approximately 3 hours after intravenous injection of radiopharmaceutical. RADIOPHARMACEUTICALS:  Twenty-one.  MCi Technetium-24m MDP IV COMPARISON:  CT 10/31/2023 FINDINGS: No radiotracer accumulation within the axillary or appendicular skeleton to suggest skeletal metastasis. Radiotracer uptake in the midthoracic spine course of the spine see osteophytosis on comparison CT. IMPRESSION: No evidence skeletal metastasis. Electronically Signed   By: Jackquline Boxer M.D.   On: 11/02/2023 09:03   CT CHEST ABDOMEN PELVIS W CONTRAST Result Date: 10/31/2023 CLINICAL DATA:  Left breast cancer initial  workup * Tracking Code: BO * EXAM: CT CHEST, ABDOMEN, AND PELVIS WITH CONTRAST TECHNIQUE: Multidetector CT imaging of the chest, abdomen  and pelvis was performed following the standard protocol during bolus administration of intravenous contrast. RADIATION DOSE REDUCTION: This exam was performed according to the departmental dose-optimization program which includes automated exposure control, adjustment of the mA and/or kV according to patient size and/or use of iterative reconstruction technique. CONTRAST:  100mL OMNIPAQUE  IOHEXOL  300 MG/ML  SOLN COMPARISON:  None Available. FINDINGS: CT CHEST FINDINGS Cardiovascular: No significant vascular findings. Normal heart size. No pericardial effusion. Mediastinum/Nodes: Enlarged left axillary lymph node measuring 1.0 x 0.8 cm (series 2, image 15). No other enlarged mediastinal, hilar, or axillary lymph nodes. Thyroid  gland, trachea, and esophagus demonstrate no significant findings. Lungs/Pleura: Lungs are clear. No pleural effusion or pneumothorax. Musculoskeletal: Status post lumpectomy of the upper outer left breast and left axillary lymph node dissection with associated simple attenuation fluid collections (series 2, image 25, 21). No acute osseous findings. CT ABDOMEN PELVIS FINDINGS Hepatobiliary: No solid liver abnormality is seen. No gallstones, gallbladder wall thickening, or biliary dilatation. Pancreas: Unremarkable. No pancreatic ductal dilatation or surrounding inflammatory changes. Spleen: Normal in size without significant abnormality. Adrenals/Urinary Tract: Adrenal glands are unremarkable. Multiple small bilateral nonobstructive renal calculi. Benign left parapelvic and renal cortical cysts, for which no further follow-up or characterization is required. No hydronephrosis. Bladder is unremarkable. Stomach/Bowel: Stomach is within normal limits. Appendix appears normal. No evidence of bowel wall thickening, distention, or inflammatory changes. Vascular/Lymphatic: No significant vascular findings are present. No enlarged abdominal or pelvic lymph nodes. Reproductive: No mass or other  abnormality. Other: No abdominal wall hernia or abnormality. No ascites. Musculoskeletal: No acute osseous findings. Status post left hip total arthroplasty. IMPRESSION: 1. Status post lumpectomy of the upper outer left breast and left axillary lymph node dissection with associated simple attenuation fluid collections, most consistent with postoperative seromas. 2. Enlarged left axillary lymph node measuring 1.0 x 0.8 cm, nonspecific and possibly reactive in the setting of recent surgery. Correlate with tissue findings of lymph node dissection. Attention on follow-up. 3. No other evidence of lymphadenopathy or metastatic disease in the chest, abdomen, or pelvis. 4. Nonobstructive bilateral nephrolithiasis. Electronically Signed   By: Marolyn JONETTA Jaksch M.D.   On: 10/31/2023 12:53

## 2023-11-19 NOTE — Patient Instructions (Signed)
It was great to see you! Thank you for allowing me to participate in your care!  I recommend that you always bring your medications to each appointment as this makes it easy to ensure you are on the correct medications and helps [place] not miss when refills are needed.  Our plans for today:  - You will be contacted to schedule colonoscopy  - continue current medications - I encourage you to see a therapist as we discussed  - Return in 1 month to f/u mood   Take care and seek immediate care sooner if you develop any concerns.   Dr. Erick Alley, DO [facility]

## 2023-11-19 NOTE — Progress Notes (Unsigned)
    SUBJECTIVE:   CHIEF COMPLAINT / HPI:   HTN Losartan increased to 50 mg at last visit (10/05/23).  Also taking metoprolol 50 mg twice daily and amlodipine 10 mg daily  Depression Having a difficult time with new diagnosis of breast cancer and now living with her daughter. Previously saw a therapist through hospice   PERTINENT  PMH / PSH: ***  OBJECTIVE:   BP 127/79   Pulse 70   Ht 5\' 2"  (1.575 m)   Wt 188 lb 3.2 oz (85.4 kg)   SpO2 97%   BMI 34.42 kg/m    General: NAD, pleasant, able to participate in exam Cardiac: RRR, no murmurs. Respiratory: CTAB, normal effort, No wheezes, rales or rhonchi Abdomen: Bowel sounds present, nontender, nondistended, no hepatosplenomegaly. Extremities: no edema or cyanosis. Skin: warm and dry, no rashes noted Neuro: alert, no obvious focal deficits Psych: Normal affect and mood  ASSESSMENT/PLAN:   No problem-specific Assessment & Plan notes found for this encounter.   HTN Well controlled. Continue current regimen as in HPI  COVID and PNA vaccines   Dr. Erick Alley, DO [facility]    {    This will disappear when note is signed, click to select method of visit    :1}

## 2023-11-19 NOTE — Assessment & Plan Note (Signed)
-  [id number], at least stage IIIA, ER+/PR+/HER2- -patient presented with a palpable left breast mass, ultrasound showed multiple enlarged left axillary lymph node. -Due to her underlying dementia, she underwent upfront surgery.  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 -Recommend CT and bone scan to rule out distant metastasis -The high risk of recurrence due to large number of positive lymph node was discussed, and the benefit of adjuvant chemotherapy, followed by adjuvant radiation, aromatase inhibitor and CDK4/6 inhibitor  --Chemotherapy consent: Side effects including but does not not limited to, fatigue, nausea, vomiting, diarrhea, hair loss, neuropathy, fluid retention, renal and kidney dysfunction, neutropenic fever, needed for blood transfusion, bleeding, cognitive deterioration from chemotherapy, were discussed with patient in great detail. She agrees to proceed. -Restaging CT chest abdomen pelvis was completed on 10/31/2023 showing no evidence of malignant lymphadenopathy and no evidence of recurrence or metastases in the chest abdomen pelvis.  Whole-body bone scan done 10/31/2023 showed no evidence of skeletal metastases. -11/08/2023 - echocardiogram showing normal LVG at 55 - 60%. No wall motion abnormality. Mild left ventricular hypertrophy with grade 1 diastolic dysfunction. No pericardial effusion. Cannot rule out small PFO.  -11/09/2023 - chemotherapy education completed 11/09/2023. -11/12/2023 - today is cycle 1 day 1 AC-T.  -11/20/2023 -toxicity check from initial chemotherapy with Adriamycin and Cytoxan.

## 2023-11-20 ENCOUNTER — Other Ambulatory Visit: Payer: Self-pay

## 2023-11-20 ENCOUNTER — Encounter: Payer: Self-pay | Admitting: Nurse Practitioner

## 2023-11-20 ENCOUNTER — Inpatient Hospital Stay: Payer: 59 | Attending: Hematology

## 2023-11-20 ENCOUNTER — Inpatient Hospital Stay (HOSPITAL_BASED_OUTPATIENT_CLINIC_OR_DEPARTMENT_OTHER): Payer: 59 | Admitting: Nurse Practitioner

## 2023-11-20 VITALS — BP 132/80 | HR 72 | Temp 97.3°F | Resp 18 | Wt 188.1 lb

## 2023-11-20 DIAGNOSIS — Z1732 Human epidermal growth factor receptor 2 negative status: Secondary | ICD-10-CM | POA: Insufficient documentation

## 2023-11-20 DIAGNOSIS — Z5189 Encounter for other specified aftercare: Secondary | ICD-10-CM | POA: Insufficient documentation

## 2023-11-20 DIAGNOSIS — Z170 Estrogen receptor positive status [ER+]: Secondary | ICD-10-CM | POA: Diagnosis not present

## 2023-11-20 DIAGNOSIS — Z5111 Encounter for antineoplastic chemotherapy: Secondary | ICD-10-CM | POA: Insufficient documentation

## 2023-11-20 DIAGNOSIS — C50412 Malignant neoplasm of upper-outer quadrant of left female breast: Secondary | ICD-10-CM | POA: Diagnosis not present

## 2023-11-20 DIAGNOSIS — C773 Secondary and unspecified malignant neoplasm of axilla and upper limb lymph nodes: Secondary | ICD-10-CM | POA: Diagnosis not present

## 2023-11-20 DIAGNOSIS — Z7982 Long term (current) use of aspirin: Secondary | ICD-10-CM | POA: Diagnosis not present

## 2023-11-20 DIAGNOSIS — Z79899 Other long term (current) drug therapy: Secondary | ICD-10-CM | POA: Diagnosis not present

## 2023-11-20 DIAGNOSIS — C50912 Malignant neoplasm of unspecified site of left female breast: Secondary | ICD-10-CM

## 2023-11-20 DIAGNOSIS — Z1721 Progesterone receptor positive status: Secondary | ICD-10-CM | POA: Insufficient documentation

## 2023-11-20 DIAGNOSIS — Z95828 Presence of other vascular implants and grafts: Secondary | ICD-10-CM | POA: Insufficient documentation

## 2023-11-20 LAB — CMP (CANCER CENTER ONLY)
ALT: 10 U/L (ref 0–44)
AST: 10 U/L — ABNORMAL LOW (ref 15–41)
Albumin: 3.8 g/dL (ref 3.5–5.0)
Alkaline Phosphatase: 122 U/L (ref 38–126)
Anion gap: 6 (ref 5–15)
BUN: 7 mg/dL — ABNORMAL LOW (ref 8–23)
CO2: 26 mmol/L (ref 22–32)
Calcium: 10.2 mg/dL (ref 8.9–10.3)
Chloride: 109 mmol/L (ref 98–111)
Creatinine: 0.69 mg/dL (ref 0.44–1.00)
GFR, Estimated: >60 mL/min (ref >60)
Glucose, Bld: 122 mg/dL — ABNORMAL HIGH (ref 70–99)
Potassium: 3.7 mmol/L (ref 3.5–5.1)
Sodium: 141 mmol/L (ref 135–145)
Total Bilirubin: 0.3 mg/dL (ref 0.0–1.2)
Total Protein: 6.4 g/dL — ABNORMAL LOW (ref 6.5–8.1)

## 2023-11-20 LAB — CBC WITH DIFFERENTIAL (CANCER CENTER ONLY)
Abs Immature Granulocytes: 0.25 10*3/uL — ABNORMAL HIGH (ref 0.00–0.07)
Basophils Absolute: 0.1 10*3/uL (ref 0.0–0.1)
Basophils Relative: 2 %
Eosinophils Absolute: 0.2 10*3/uL (ref 0.0–0.5)
Eosinophils Relative: 5 %
HCT: 33.1 % — ABNORMAL LOW (ref 36.0–46.0)
Hemoglobin: 10.8 g/dL — ABNORMAL LOW (ref 12.0–15.0)
Immature Granulocytes: 6 %
Lymphocytes Relative: 37 %
Lymphs Abs: 1.5 10*3/uL (ref 0.7–4.0)
MCH: 28.3 pg (ref 26.0–34.0)
MCHC: 32.6 g/dL (ref 30.0–36.0)
MCV: 86.9 fL (ref 80.0–100.0)
Monocytes Absolute: 0.3 10*3/uL (ref 0.1–1.0)
Monocytes Relative: 8 %
Neutro Abs: 1.6 10*3/uL — ABNORMAL LOW (ref 1.7–7.7)
Neutrophils Relative %: 42 %
Platelet Count: 132 10*3/uL — ABNORMAL LOW (ref 150–400)
RBC: 3.81 MIL/uL — ABNORMAL LOW (ref 3.87–5.11)
RDW: 12.2 % (ref 11.5–15.5)
WBC Count: 3.9 10*3/uL — ABNORMAL LOW (ref 4.0–10.5)
nRBC: 0 % (ref 0.0–0.2)

## 2023-11-20 MED ORDER — SODIUM CHLORIDE 0.9% FLUSH
10.0000 mL | Freq: Once | INTRAVENOUS | Status: AC
  Administered 2023-11-20: 10 mL

## 2023-11-20 MED ORDER — HEPARIN SOD (PORK) LOCK FLUSH 100 UNIT/ML IV SOLN
500.0000 [IU] | Freq: Once | INTRAVENOUS | Status: AC
Start: 2023-11-20 — End: 2023-11-20
  Administered 2023-11-20: 500 [IU]

## 2023-11-20 NOTE — Assessment & Plan Note (Addendum)
 No SI.  Plans to establish relationship with therapist again. Discussed that she is on a low-dose of Zoloft  50 mg daily and has room to go up if she desires to do this in the future.  Encouraged open communication with family members, spending more time outside as the weather gets warm and doing relaxing enjoyable hobbies. F/u 1 month

## 2023-11-20 NOTE — Assessment & Plan Note (Signed)
Well controlled. Continue current regimen as in HPI

## 2023-11-25 NOTE — Assessment & Plan Note (Signed)
-  pT3N3aM0, stage IIIA, ER+/PR+/HER2- -diagnosed in 09/2023. Patient presented with a palpable left breast mass, ultrasound showed multiple enlarged left axillary lymph node. -Due to her underlying dementia, she underwent upfront surgery on 10/04/2023.  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 -I discussed the high risk of recurrence due to large number of positive lymph node, and the benefit of adjuvant chemotherapy, followed by adjuvant radiation, aromatase inhibitor and CDK4/6 inhibitor  -her staging CT and bone scan was negative for distant metastasis, except a 1cm left axillary node which is likely reactive to surgery, will f/u on CT in 6 months  -she started adjuvant chemo AC on 11/12/2023

## 2023-11-26 ENCOUNTER — Inpatient Hospital Stay (HOSPITAL_BASED_OUTPATIENT_CLINIC_OR_DEPARTMENT_OTHER): Payer: 59 | Admitting: Hematology

## 2023-11-26 ENCOUNTER — Encounter: Payer: Self-pay | Admitting: *Deleted

## 2023-11-26 ENCOUNTER — Inpatient Hospital Stay: Payer: 59

## 2023-11-26 VITALS — BP 140/63 | HR 66 | Temp 97.7°F | Resp 16 | Ht 62.0 in | Wt 189.8 lb

## 2023-11-26 DIAGNOSIS — Z79899 Other long term (current) drug therapy: Secondary | ICD-10-CM | POA: Diagnosis not present

## 2023-11-26 DIAGNOSIS — C773 Secondary and unspecified malignant neoplasm of axilla and upper limb lymph nodes: Secondary | ICD-10-CM | POA: Diagnosis not present

## 2023-11-26 DIAGNOSIS — C50412 Malignant neoplasm of upper-outer quadrant of left female breast: Secondary | ICD-10-CM

## 2023-11-26 DIAGNOSIS — Z1721 Progesterone receptor positive status: Secondary | ICD-10-CM | POA: Diagnosis not present

## 2023-11-26 DIAGNOSIS — Z5111 Encounter for antineoplastic chemotherapy: Secondary | ICD-10-CM | POA: Diagnosis not present

## 2023-11-26 DIAGNOSIS — C50912 Malignant neoplasm of unspecified site of left female breast: Secondary | ICD-10-CM

## 2023-11-26 DIAGNOSIS — Z95828 Presence of other vascular implants and grafts: Secondary | ICD-10-CM

## 2023-11-26 DIAGNOSIS — Z1732 Human epidermal growth factor receptor 2 negative status: Secondary | ICD-10-CM | POA: Diagnosis not present

## 2023-11-26 DIAGNOSIS — Z170 Estrogen receptor positive status [ER+]: Secondary | ICD-10-CM

## 2023-11-26 DIAGNOSIS — Z7982 Long term (current) use of aspirin: Secondary | ICD-10-CM | POA: Diagnosis not present

## 2023-11-26 DIAGNOSIS — Z5189 Encounter for other specified aftercare: Secondary | ICD-10-CM | POA: Diagnosis not present

## 2023-11-26 LAB — CBC WITH DIFFERENTIAL (CANCER CENTER ONLY)
Abs Immature Granulocytes: 0.52 10*3/uL — ABNORMAL HIGH (ref 0.00–0.07)
Basophils Absolute: 0 10*3/uL (ref 0.0–0.1)
Basophils Relative: 1 %
Eosinophils Absolute: 0.1 10*3/uL (ref 0.0–0.5)
Eosinophils Relative: 1 %
HCT: 32.5 % — ABNORMAL LOW (ref 36.0–46.0)
Hemoglobin: 10.4 g/dL — ABNORMAL LOW (ref 12.0–15.0)
Immature Granulocytes: 7 %
Lymphocytes Relative: 30 %
Lymphs Abs: 2.2 10*3/uL (ref 0.7–4.0)
MCH: 27.7 pg (ref 26.0–34.0)
MCHC: 32 g/dL (ref 30.0–36.0)
MCV: 86.7 fL (ref 80.0–100.0)
Monocytes Absolute: 0.8 10*3/uL (ref 0.1–1.0)
Monocytes Relative: 10 %
Neutro Abs: 3.8 10*3/uL (ref 1.7–7.7)
Neutrophils Relative %: 51 %
Platelet Count: 174 10*3/uL (ref 150–400)
RBC: 3.75 MIL/uL — ABNORMAL LOW (ref 3.87–5.11)
RDW: 12.9 % (ref 11.5–15.5)
WBC Count: 7.3 10*3/uL (ref 4.0–10.5)
nRBC: 0.3 % — ABNORMAL HIGH (ref 0.0–0.2)

## 2023-11-26 LAB — CMP (CANCER CENTER ONLY)
ALT: 10 U/L (ref 0–44)
AST: 13 U/L — ABNORMAL LOW (ref 15–41)
Albumin: 3.8 g/dL (ref 3.5–5.0)
Alkaline Phosphatase: 125 U/L (ref 38–126)
Anion gap: 4 — ABNORMAL LOW (ref 5–15)
BUN: 7 mg/dL — ABNORMAL LOW (ref 8–23)
CO2: 27 mmol/L (ref 22–32)
Calcium: 10 mg/dL (ref 8.9–10.3)
Chloride: 111 mmol/L (ref 98–111)
Creatinine: 0.67 mg/dL (ref 0.44–1.00)
GFR, Estimated: >60 mL/min (ref >60)
Glucose, Bld: 105 mg/dL — ABNORMAL HIGH (ref 70–99)
Potassium: 4.2 mmol/L (ref 3.5–5.1)
Sodium: 142 mmol/L (ref 135–145)
Total Bilirubin: 0.2 mg/dL (ref 0.0–1.2)
Total Protein: 6.4 g/dL — ABNORMAL LOW (ref 6.5–8.1)

## 2023-11-26 MED ORDER — PALONOSETRON HCL INJECTION 0.25 MG/5ML
0.2500 mg | Freq: Once | INTRAVENOUS | Status: AC
  Administered 2023-11-26: 0.25 mg via INTRAVENOUS
  Filled 2023-11-26: qty 5

## 2023-11-26 MED ORDER — DOXORUBICIN HCL CHEMO IV INJECTION 2 MG/ML
60.0000 mg/m2 | Freq: Once | INTRAVENOUS | Status: AC
  Administered 2023-11-26: 116 mg via INTRAVENOUS
  Filled 2023-11-26: qty 58

## 2023-11-26 MED ORDER — DEXAMETHASONE SODIUM PHOSPHATE 10 MG/ML IJ SOLN
10.0000 mg | Freq: Once | INTRAMUSCULAR | Status: AC
  Administered 2023-11-26: 10 mg via INTRAVENOUS
  Filled 2023-11-26: qty 1

## 2023-11-26 MED ORDER — SODIUM CHLORIDE 0.9 % IV SOLN: INTRAVENOUS | Status: DC

## 2023-11-26 MED ORDER — SODIUM CHLORIDE 0.9 % IV SOLN
600.0000 mg/m2 | Freq: Once | INTRAVENOUS | Status: AC
  Administered 2023-11-26: 1160 mg via INTRAVENOUS
  Filled 2023-11-26: qty 58

## 2023-11-26 MED ORDER — SODIUM CHLORIDE 0.9% FLUSH
10.0000 mL | Freq: Once | INTRAVENOUS | Status: AC
  Administered 2023-11-26: 10 mL

## 2023-11-26 NOTE — Progress Notes (Signed)
 [facility]   [phone] [phone]   Clinic Follow up Note   Patient Care Team: Glenn Lange, DO as PCP - General (Family Medicine) Avanell Leigh, MD as PCP - Cardiology (Cardiology) Brian Campanile, MD (Inactive) as Consulting Physician (Neurology) Sonja West Tawakoni, MD as Consulting Physician (Hematology and Oncology)  Date of Service:  11/26/2023  CHIEF COMPLAINT: f/u of breast cancer  CURRENT THERAPY:  Adjuvant chemotherapy Adriamycin  and Cytoxan   Oncology History   Malignant neoplasm of upper-outer quadrant of left breast in female, estrogen receptor positive (HCC) -pT3N3aM0, stage IIIA, ER+/PR+/HER2- -diagnosed in 09/2023. Patient presented with a palpable left breast mass, ultrasound showed multiple enlarged left axillary lymph node. -Due to her underlying dementia, she underwent upfront surgery on 10/04/2023.  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 -I discussed the high risk of recurrence due to large number of positive lymph node, and the benefit of adjuvant chemotherapy, followed by adjuvant radiation, aromatase inhibitor and CDK4/6 inhibitor  -her staging CT and bone scan was negative for distant metastasis, except a 1cm left axillary node which is likely reactive to surgery, will f/u on CT in 6 months  -she started adjuvant chemo AC on 11/12/2023   Assessment and Plan    Breast Cancer 68 year old female with breast cancer undergoing chemotherapy. She has completed first cycle and will proceed cycle 2 Adriamycin  and Cytoxan . Reports manageable fatigue, no significant neuropathy or cognitive issues, and improved appetite with stable weight. Discussed potential cumulative side effects and emphasized monitoring weight and side effects. Treatment duration is approximately five to six months. - Continue chemotherapy with Adriamycin  and Cytoxan  - Monitor weight regularly - Monitor for cumulative side  effects such as fatigue, appetite loss, and cognitive issues - Ensure adequate supply of antiemetics - Review lab results when available - Schedule next appointment in two weeks for cycle three  General Health Maintenance Discussed monitoring for oral thrush and respiratory symptoms. - Advise to check for oral thrush during oral hygiene - Monitor for respiratory symptoms such as cough or dyspnea.     Plan -Lab reviewed, adequate for treatment, will proceed to cycle 2 AC with G-CSF injection in 2 days -Lab, follow-up and cycle 3 in 2 weeks   SUMMARY OF ONCOLOGIC HISTORY: Oncology History  Infiltrating ductal carcinoma of breast (HCC)  09/22/2023 Initial Diagnosis   Infiltrating ductal carcinoma of breast (HCC)   11/12/2023 -  Chemotherapy   Patient is on Treatment Plan : BREAST DOSE DENSE AC q14d / PACLitaxel  q7d     Malignant neoplasm of upper-outer quadrant of left breast in female, estrogen receptor positive (HCC)  09/28/2023 Initial Diagnosis   Malignant neoplasm of upper-outer quadrant of left breast in female, estrogen receptor positive (HCC)   09/28/2023 Cancer Staging   Staging form: Breast, AJCC 8th Edition - Clinical stage from 09/28/2023: Stage IIA (cT2, cN1, cM0, G2, ER+, PR+, HER2-) - Signed by Sonja Central Aguirre, MD on 09/28/2023 Histologic grading system: 3 grade system   10/04/2023 Cancer Staging   Staging form: Breast, AJCC 8th Edition - Pathologic stage from 10/04/2023: Stage IIIA (pT2, pN3a, cM0, G2, ER+, PR+, HER2-) - Signed by Sonja [place], MD on 10/19/2023 Histologic grading system: 3 grade system Residual tumor (R): R0 - None   10/31/2023 Imaging   NM whole body bone scan IMPRESSION: No evidence skeletal metastasis.   10/31/2023 Imaging   CT chest abdomen and pelvis with contrast  IMPRESSION: 1. Status post lumpectomy of the  upper outer left breast and left axillary lymph node dissection with associated simple attenuation fluid collections, most consistent with  postoperative seromas. 2. Enlarged left axillary lymph node measuring 1.0 x 0.8 cm, nonspecific and possibly reactive in the setting of recent surgery. Correlate with tissue findings of lymph node dissection. Attention on follow-up. 3. No other evidence of lymphadenopathy or metastatic disease in the chest, abdomen, or pelvis. 4. Nonobstructive bilateral nephrolithiasis.   11/08/2023 Echocardiogram   Echocardiogram FINDINGS   Left Ventricle: Left ventricular ejection fraction, by estimation, is 55 to 60%. Left ventricular ejection fraction by 3D volume is 56 %. The left ventricle has normal function. The left ventricle has no regional wall motion abnormalities. Global longitudinal strain performed but not reported based on interpreter judgement due to suboptimal tracking. The left ventricular internal cavity size was mildly dilated. There is mild left ventricular hypertrophy. Left ventricular diastolic parameters are consistent with Grade I diastolic dysfunction (impaired relaxation). Indeterminate filling pressures.  Right Ventricle: The right ventricular size is normal. No increase in right ventricular wall thickness. Right ventricular systolic function is  low normal. There is normal pulmonary artery systolic pressure. The tricuspid regurgitant velocity is 2.12 m/s,  and with an assumed right atrial pressure of 3 mmHg, the estimated right ventricular systolic pressure is 21.0 mmHg.  Left Atrium: Left atrial size was normal in size.  Right Atrium: Right atrial size was normal in size.  Pericardium: There is no evidence of pericardial effusion.  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 No evidence of mitral valve stenosis.  Tricuspid Valve: The tricuspid valve is normal in structure. Tricuspid valve regurgitation is mild . No evidence of tricuspid stenosis.  Aortic Valve: The aortic valve is tricuspid. Aortic valve regurgitation is not visualized. No aortic  stenosis is present.  Pulmonic Valve: The pulmonic valve was normal in structure. Pulmonic valve regurgitation is mild. No evidence of pulmonic stenosis.  Aorta: The aortic root and ascending aorta are structurally normal, with no evidence of dilitation.  Venous: The inferior vena cava is normal in size with greater than 50% respiratory variability, suggesting right atrial pressure of 3 mmHg.  IAS/Shunts: There is redundancy of the interatrial septum. Cannot exclude a small PFO.        Discussed the use of AI scribe software for clinical note transcription with the patient, who gave verbal consent to proceed.  History of Present Illness   A 68 year old patient with a history of breast cancer is currently undergoing chemotherapy. She reports that her appetite, which was initially affected by the treatment, is gradually improving. She denies experiencing any numbness, tingling, or neuropathy following her last treatment. Fatigue, a common side effect of chemotherapy, is reported to be manageable. The patient's daughter confirms that the patient remains active, partly due to the responsibilities of caring for a new puppy.  The patient has been experiencing memory issues, which were present before the initiation of chemotherapy. The daughter does not believe these issues have worsened since the start of treatment. The patient denies needing any medication refills and confirms that she has sufficient anti-nausea medication.         All other systems were reviewed with the patient and are negative.  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   Hyperlipidemia    Hyperparathyroidism (HCC) 07/25/2019   Hypertension    Normal cardiac stress test    Myoview stress test   OAB (overactive bladder) 09/01/2015   Osteopenia 05/2018   T score -1.1 FRAX 2.5%/ 0.1%   Subclinical hyperthyroidism 05/08/2007   Qualifier: Diagnosis of  By: Daneil Dunker MD, [facility]      SURGICAL HISTORY: Past Surgical History:  Procedure Laterality Date   BREAST BIOPSY Left 09/19/2023   x's 2   BREAST BIOPSY Left 09/19/2023   US  LT BREAST BX W LOC DEV 1ST LESION IMG BX SPEC US  GUIDE 09/19/2023 [facility]   BREAST BIOPSY Left 10/03/2023   US  LT RADIOACTIVE SEED LOC 10/03/2023 [facility]   BREAST BIOPSY Left 10/03/2023   US  LT RADIOACTIVE SEED EA ADD LESION 10/03/2023 [facility]   BREAST LUMPECTOMY WITH RADIOACTIVE SEED AND AXILLARY LYMPH NODE DISSECTION Left 10/04/2023   Procedure: LEFT BREAST LUMPECTOMY AND TARGETED LYMPH NODE DISSECTION;  Surgeon: Oza Blumenthal, MD;  Location: [facility];  Service: General;  Laterality: Left;   COLONOSCOPY     IR IMAGING GUIDED PORT INSERTION  11/05/2023   POLYPECTOMY     TOTAL HIP ARTHROPLASTY Left 10/01/2013   DR ROWAN   TOTAL HIP ARTHROPLASTY Left 10/01/2013   Procedure: TOTAL HIP ARTHROPLASTY;  Surgeon: Ilean Mall, MD;  Location: [facility];  Service: Orthopedics;  Laterality: Left;    I have reviewed the social history and family history with the patient and they are unchanged from previous note.  ALLERGIES:  is allergic to [facility] [aprepitant], hydrochlorothiazide , hydrocodone , and simvastatin.  MEDICATIONS:  Current Outpatient Medications  Medication Sig Dispense Refill   albuterol  (VENTOLIN  HFA) 108 (90 Base) MCG/ACT inhaler USE 2 INHALATIONS BY MOUTH EVERY 6 HOURS AS NEEDED FOR WHEEZING  OR SHORTNESS OF BREATH (Patient taking differently: Inhale 2 puffs into the lungs every 6 (six) hours as needed for wheezing or shortness of  breath.) 26.8 g 2   amLODipine  (NORVASC ) 10 MG tablet TAKE 1 TABLET BY MOUTH EVERY  MORNING 100 tablet 2   aspirin  EC 81 MG tablet Take 81 mg by mouth daily. Swallow whole.     Budesonide  90 MCG/ACT inhaler Inhale 1 puff into the lungs 2 (two) times daily. 1 each 0   capsaicin  (ZOSTRIX) 0.025 % cream Apply topically 2 (two) times daily. 60 g 0   cholecalciferol (VITAMIN D3) 25 MCG (1000 UNIT) tablet Take 1 tablet (1,000 Units total) by mouth daily. 7 tablet 0   Cholecalciferol (VITAMIN D3) 50 MCG (2000 UT) CHEW Chew 2,000 Units by mouth daily.     donepezil (ARICEPT) 10 MG tablet Take 10 mg by mouth at bedtime.     fluticasone  (FLONASE ) 50 MCG/ACT nasal spray USE 1 SPRAY IN BOTH NOSTRILS  DAILY (Patient taking differently: Place 1 spray into both nostrils daily as needed for allergies or rhinitis.) 16 g 0   fluticasone  (FLOVENT  HFA) 110 MCG/ACT inhaler Inhale 1 puff into the lungs daily as needed. (Patient taking differently: Inhale 1 puff into the lungs daily as needed ("for flares").) 1 each 12   gabapentin  (NEURONTIN ) 100 MG capsule TAKE 1 CAPSULE  BY MOUTH DAILY 90 capsule 3   lidocaine -prilocaine  (EMLA ) cream Apply to affected area once 30 g 3   loratadine  (EQ ALLERGY RELIEF) 10 MG tablet Take 1 tablet (10 mg total) by mouth daily. (Patient taking differently: Take 10 mg by mouth daily as needed for allergies or rhinitis.) 90 tablet 3   losartan  (COZAAR ) 25 MG tablet Take 50 mg by mouth at bedtime.     losartan  (COZAAR ) 50 MG tablet Take 1 tablet (50 mg total) by mouth at bedtime. 90 tablet 3   meloxicam  (MOBIC ) 15 MG tablet Take 1 tablet by mouth once daily (Patient taking differently: Take 15 mg by mouth daily as needed for pain.) 30 tablet 0   metoprolol  tartrate (LOPRESSOR ) 50 MG tablet TAKE 1 TABLET BY MOUTH TWICE  DAILY (Patient taking differently: Take 50 mg by mouth in the morning and at bedtime.) 200 tablet 2   ondansetron  (ZOFRAN ) 8 MG tablet Take 1 tab (8 mg) by mouth every 8 hrs as  needed for nausea/vomiting. Start third day after doxorubicin /cyclophosphamide  chemotherapy. 30 tablet 1   ondansetron  (ZOFRAN -ODT) 4 MG disintegrating tablet Take 4 mg by mouth every 8 (eight) hours as needed for nausea or vomiting (dissolve orally).     prochlorperazine  (COMPAZINE ) 10 MG tablet Take 1 tablet (10 mg total) by mouth every 6 (six) hours as needed for nausea or vomiting. 30 tablet 1   rosuvastatin  (CRESTOR ) 5 MG tablet Take one tablet by mouth every Monday and Friday. (Patient taking differently: Take 5 mg by mouth See admin instructions. Take 5 mg by mouth every Monday and Friday) 20 tablet 4   sertraline  (ZOLOFT ) 50 MG tablet Take 1 tablet (50 mg total) by mouth daily. 30 tablet 0   traMADol  (ULTRAM ) 50 MG tablet Take 1 tablet (50 mg total) by mouth every 6 (six) hours as needed for moderate pain (pain score 4-6) or severe pain (pain score 7-10). 25 tablet 0   TYLENOL  500 MG tablet Take 500-1,000 mg by mouth every 6 (six) hours as needed for mild pain (pain score 1-3) or headache.     No current facility-administered medications for this visit.   Facility-Administered Medications Ordered in Other Visits  Medication Dose Route Frequency Provider Last Rate Last Admin   0.9 %  sodium chloride  infusion   Intravenous Continuous Sonja Rowan, MD   Stopped at 11/26/23 1256    PHYSICAL EXAMINATION: ECOG PERFORMANCE STATUS: 1 - Symptomatic but completely ambulatory  Vitals:   11/26/23 0954  BP: (!) 140/63  Pulse: 66  Resp: 16  Temp: 97.7 F (36.5 C)  SpO2: 100%   Wt Readings from Last 3 Encounters:  11/26/23 86.1 kg  11/20/23 85.3 kg  11/19/23 85.4 kg     GENERAL:alert, no distress and comfortable SKIN: skin color, texture, turgor are normal, no rashes or significant lesions EYES: normal, Conjunctiva are pink and non-injected, sclera clear NECK: supple, thyroid  normal size, non-tender, without nodularity LYMPH:  no palpable lymphadenopathy in the cervical, axillary  LUNGS:  clear to auscultation and percussion with normal breathing effort HEART: regular rate & rhythm and no murmurs and no lower extremity edema ABDOMEN:abdomen soft, non-tender and normal bowel sounds Musculoskeletal:no cyanosis of digits and no clubbing  NEURO: alert & oriented x 3 with fluent speech, no focal motor/sensory deficits   LABORATORY DATA:  I have reviewed the data as listed    Latest Ref Rng & Units 11/26/2023    9:33 AM 11/20/2023    9:41 AM  11/12/2023    8:19 AM  CBC  WBC 4.0 - 10.5 K/uL 7.3  3.9  6.5   Hemoglobin 12.0 - 15.0 g/dL 33.2  95.1  88.4   Hematocrit 36.0 - 46.0 % 32.5  33.1  37.1   Platelets 150 - 400 K/uL 174  132  247         Latest Ref Rng & Units 11/26/2023    9:33 AM 11/20/2023    9:41 AM 11/12/2023    8:19 AM  CMP  Glucose 70 - 99 mg/dL 166  063  016   BUN 8 - 23 mg/dL 7  7  10    Creatinine 0.44 - 1.00 mg/dL 0.10  9.32  3.55   Sodium 135 - 145 mmol/L 142  141  141   Potassium 3.5 - 5.1 mmol/L 4.2  3.7  3.5   Chloride 98 - 111 mmol/L 111  109  110   CO2 22 - 32 mmol/L 27  26  25    Calcium  8.9 - 10.3 mg/dL 73.2  20.2  54.2   Total Protein 6.5 - 8.1 g/dL 6.4  6.4  6.9   Total Bilirubin 0.0 - 1.2 mg/dL 0.2  0.3  0.4   Alkaline Phos 38 - 126 U/L 125  122  124   AST 15 - 41 U/L 13  10  13    ALT 0 - 44 U/L 10  10  10        RADIOGRAPHIC STUDIES: I have personally reviewed the radiological images as listed and agreed with the findings in the report. No results found.    No orders of the defined types were placed in this encounter.  All questions were answered. The patient knows to call the clinic with any problems, questions or concerns. No barriers to learning was detected. The total time spent in the appointment was 25 minutes.     Sonja [place], MD 11/26/2023

## 2023-11-26 NOTE — Patient Instructions (Signed)
 [facility] - A DEPT OF [facility]  Discharge Instructions: Thank you for choosing [facility] to provide your oncology and hematology care.   If you have a lab appointment with the Cancer Center, please go directly to the Cancer Center and check in at the registration area.   Wear comfortable clothing and clothing appropriate for easy access to any Portacath or PICC line.   We strive to give you quality time with your provider. You may need to reschedule your appointment if you arrive late (15 or more minutes).  Arriving late affects you and other patients whose appointments are after yours.  Also, if you miss three or more appointments without notifying the office, you may be dismissed from the clinic at the provider's discretion.      For prescription refill requests, have your pharmacy contact our office and allow 72 hours for refills to be completed.    Today you received the following chemotherapy and/or immunotherapy agents: Adriamycin, Cytoxan.       To help prevent nausea and vomiting after your treatment, we encourage you to take your nausea medication as directed.  BELOW ARE SYMPTOMS THAT SHOULD BE REPORTED IMMEDIATELY: *FEVER GREATER THAN 100.4 F (38 C) OR HIGHER *CHILLS OR SWEATING *NAUSEA AND VOMITING THAT IS NOT CONTROLLED WITH YOUR NAUSEA MEDICATION *UNUSUAL SHORTNESS OF BREATH *UNUSUAL BRUISING OR BLEEDING *URINARY PROBLEMS (pain or burning when urinating, or frequent urination) *BOWEL PROBLEMS (unusual diarrhea, constipation, pain near the anus) TENDERNESS IN MOUTH AND THROAT WITH OR WITHOUT PRESENCE OF ULCERS (sore throat, sores in mouth, or a toothache) UNUSUAL RASH, SWELLING OR PAIN  UNUSUAL VAGINAL DISCHARGE OR ITCHING   Items with * indicate a potential emergency and should be followed up as soon as possible or go to the Emergency Department if any problems should occur.  Please show the CHEMOTHERAPY ALERT CARD or  IMMUNOTHERAPY ALERT CARD at check-in to the Emergency Department and triage nurse.  Should you have questions after your visit or need to cancel or reschedule your appointment, please contact [facility] - A DEPT OF [facility]  Dept: [phone]  and follow the prompts.  Office hours are 8:00 a.m. to 4:30 p.m. Monday - Friday. Please note that voicemails left after 4:00 p.m. may not be returned until the following business day.  We are closed weekends and major holidays. You have access to a nurse at all times for urgent questions. Please call the main number to the clinic Dept: [phone] and follow the prompts.   For any non-urgent questions, you may also contact your provider using MyChart. We now offer e-Visits for anyone 60 and older to request care online for non-urgent symptoms. For details visit [email or web address].   Also download the MyChart app! Go to the app store, search "MyChart", open the app, select [place], and log in with your MyChart username and password.

## 2023-11-28 ENCOUNTER — Inpatient Hospital Stay: Payer: 59

## 2023-11-28 VITALS — BP 136/81 | HR 65 | Temp 98.0°F | Resp 18

## 2023-11-28 DIAGNOSIS — Z79899 Other long term (current) drug therapy: Secondary | ICD-10-CM | POA: Diagnosis not present

## 2023-11-28 DIAGNOSIS — C50412 Malignant neoplasm of upper-outer quadrant of left female breast: Secondary | ICD-10-CM | POA: Diagnosis not present

## 2023-11-28 DIAGNOSIS — Z1721 Progesterone receptor positive status: Secondary | ICD-10-CM | POA: Diagnosis not present

## 2023-11-28 DIAGNOSIS — Z1732 Human epidermal growth factor receptor 2 negative status: Secondary | ICD-10-CM | POA: Diagnosis not present

## 2023-11-28 DIAGNOSIS — Z5111 Encounter for antineoplastic chemotherapy: Secondary | ICD-10-CM | POA: Diagnosis not present

## 2023-11-28 DIAGNOSIS — C50912 Malignant neoplasm of unspecified site of left female breast: Secondary | ICD-10-CM

## 2023-11-28 DIAGNOSIS — Z7982 Long term (current) use of aspirin: Secondary | ICD-10-CM | POA: Diagnosis not present

## 2023-11-28 DIAGNOSIS — Z170 Estrogen receptor positive status [ER+]: Secondary | ICD-10-CM | POA: Diagnosis not present

## 2023-11-28 DIAGNOSIS — C773 Secondary and unspecified malignant neoplasm of axilla and upper limb lymph nodes: Secondary | ICD-10-CM | POA: Diagnosis not present

## 2023-11-28 DIAGNOSIS — Z5189 Encounter for other specified aftercare: Secondary | ICD-10-CM | POA: Diagnosis not present

## 2023-11-28 MED ORDER — PEGFILGRASTIM-CBQV 6 MG/0.6ML ~~LOC~~ SOSY
6.0000 mg | PREFILLED_SYRINGE | Freq: Once | SUBCUTANEOUS | Status: AC
  Administered 2023-11-28: 6 mg via SUBCUTANEOUS
  Filled 2023-11-28: qty 0.6

## 2023-11-28 NOTE — Patient Instructions (Signed)

## 2023-12-05 ENCOUNTER — Encounter: Payer: Self-pay | Admitting: Primary Care

## 2023-12-11 NOTE — Assessment & Plan Note (Signed)
-  pT3N3aM0, stage IIIA, ER+/PR+/HER2- -diagnosed in 09/2023. Patient presented with a palpable left breast mass, ultrasound showed multiple enlarged left axillary lymph node. -Due to her underlying dementia, she underwent upfront surgery on 10/04/2023.  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 -I discussed the high risk of recurrence due to large number of positive lymph node, and the benefit of adjuvant chemotherapy, followed by adjuvant radiation, aromatase inhibitor and CDK4/6 inhibitor  -her staging CT and bone scan was negative for distant metastasis, except a 1cm left axillary node which is likely reactive to surgery, will f/u on CT in 6 months  -she started adjuvant chemo AC on 11/12/2023

## 2023-12-12 ENCOUNTER — Inpatient Hospital Stay (HOSPITAL_BASED_OUTPATIENT_CLINIC_OR_DEPARTMENT_OTHER): Payer: 59 | Admitting: Hematology

## 2023-12-12 ENCOUNTER — Inpatient Hospital Stay: Payer: 59

## 2023-12-12 ENCOUNTER — Encounter: Payer: Self-pay | Admitting: Hematology

## 2023-12-12 VITALS — BP 142/75 | HR 71 | Temp 97.6°F | Resp 15 | Wt 188.8 lb

## 2023-12-12 DIAGNOSIS — C50412 Malignant neoplasm of upper-outer quadrant of left female breast: Secondary | ICD-10-CM

## 2023-12-12 DIAGNOSIS — C773 Secondary and unspecified malignant neoplasm of axilla and upper limb lymph nodes: Secondary | ICD-10-CM | POA: Diagnosis not present

## 2023-12-12 DIAGNOSIS — Z1732 Human epidermal growth factor receptor 2 negative status: Secondary | ICD-10-CM | POA: Diagnosis not present

## 2023-12-12 DIAGNOSIS — C50912 Malignant neoplasm of unspecified site of left female breast: Secondary | ICD-10-CM | POA: Diagnosis not present

## 2023-12-12 DIAGNOSIS — Z170 Estrogen receptor positive status [ER+]: Secondary | ICD-10-CM

## 2023-12-12 DIAGNOSIS — Z1721 Progesterone receptor positive status: Secondary | ICD-10-CM | POA: Diagnosis not present

## 2023-12-12 DIAGNOSIS — Z95828 Presence of other vascular implants and grafts: Secondary | ICD-10-CM

## 2023-12-12 DIAGNOSIS — Z5111 Encounter for antineoplastic chemotherapy: Secondary | ICD-10-CM | POA: Diagnosis not present

## 2023-12-12 DIAGNOSIS — Z7982 Long term (current) use of aspirin: Secondary | ICD-10-CM | POA: Diagnosis not present

## 2023-12-12 DIAGNOSIS — Z5189 Encounter for other specified aftercare: Secondary | ICD-10-CM | POA: Diagnosis not present

## 2023-12-12 DIAGNOSIS — Z79899 Other long term (current) drug therapy: Secondary | ICD-10-CM | POA: Diagnosis not present

## 2023-12-12 LAB — CBC WITH DIFFERENTIAL (CANCER CENTER ONLY)
Abs Immature Granulocytes: 0.28 10*3/uL — ABNORMAL HIGH (ref 0.00–0.07)
Basophils Absolute: 0.1 10*3/uL (ref 0.0–0.1)
Basophils Relative: 1 %
Eosinophils Absolute: 0.1 10*3/uL (ref 0.0–0.5)
Eosinophils Relative: 1 %
HCT: 31.1 % — ABNORMAL LOW (ref 36.0–46.0)
Hemoglobin: 9.9 g/dL — ABNORMAL LOW (ref 12.0–15.0)
Immature Granulocytes: 4 %
Lymphocytes Relative: 25 %
Lymphs Abs: 1.7 10*3/uL (ref 0.7–4.0)
MCH: 28.3 pg (ref 26.0–34.0)
MCHC: 31.8 g/dL (ref 30.0–36.0)
MCV: 88.9 fL (ref 80.0–100.0)
Monocytes Absolute: 0.9 10*3/uL (ref 0.1–1.0)
Monocytes Relative: 13 %
Neutro Abs: 3.7 10*3/uL (ref 1.7–7.7)
Neutrophils Relative %: 56 %
Platelet Count: 282 10*3/uL (ref 150–400)
RBC: 3.5 MIL/uL — ABNORMAL LOW (ref 3.87–5.11)
RDW: 14.7 % (ref 11.5–15.5)
WBC Count: 6.7 10*3/uL (ref 4.0–10.5)
nRBC: 0 % (ref 0.0–0.2)

## 2023-12-12 LAB — CMP (CANCER CENTER ONLY)
ALT: 12 U/L (ref 0–44)
AST: 14 U/L — ABNORMAL LOW (ref 15–41)
Albumin: 3.8 g/dL (ref 3.5–5.0)
Alkaline Phosphatase: 122 U/L (ref 38–126)
Anion gap: 5 (ref 5–15)
BUN: 7 mg/dL — ABNORMAL LOW (ref 8–23)
CO2: 29 mmol/L (ref 22–32)
Calcium: 10.1 mg/dL (ref 8.9–10.3)
Chloride: 110 mmol/L (ref 98–111)
Creatinine: 0.71 mg/dL (ref 0.44–1.00)
GFR, Estimated: >60 mL/min (ref >60)
Glucose, Bld: 109 mg/dL — ABNORMAL HIGH (ref 70–99)
Potassium: 3.5 mmol/L (ref 3.5–5.1)
Sodium: 144 mmol/L (ref 135–145)
Total Bilirubin: 0.3 mg/dL (ref 0.0–1.2)
Total Protein: 6.3 g/dL — ABNORMAL LOW (ref 6.5–8.1)

## 2023-12-12 MED ORDER — SODIUM CHLORIDE 0.9 % IV SOLN: INTRAVENOUS | Status: DC

## 2023-12-12 MED ORDER — DEXAMETHASONE SODIUM PHOSPHATE 10 MG/ML IJ SOLN
10.0000 mg | Freq: Once | INTRAMUSCULAR | Status: AC
  Administered 2023-12-12: 10 mg via INTRAVENOUS
  Filled 2023-12-12: qty 1

## 2023-12-12 MED ORDER — PALONOSETRON HCL INJECTION 0.25 MG/5ML
0.2500 mg | Freq: Once | INTRAVENOUS | Status: AC
  Administered 2023-12-12: 0.25 mg via INTRAVENOUS
  Filled 2023-12-12: qty 5

## 2023-12-12 MED ORDER — DOXORUBICIN HCL CHEMO IV INJECTION 2 MG/ML
60.0000 mg/m2 | Freq: Once | INTRAVENOUS | Status: AC
Start: 2023-12-12 — End: 2023-12-12
  Administered 2023-12-12: 116 mg via INTRAVENOUS
  Filled 2023-12-12: qty 58

## 2023-12-12 MED ORDER — CYCLOPHOSPHAMIDE CHEMO INJECTION 1 GM
600.0000 mg/m2 | Freq: Once | INTRAMUSCULAR | Status: AC
  Administered 2023-12-12: 1160 mg via INTRAVENOUS
  Filled 2023-12-12: qty 58

## 2023-12-12 MED ORDER — SODIUM CHLORIDE 0.9% FLUSH
10.0000 mL | Freq: Once | INTRAVENOUS | Status: AC
  Administered 2023-12-12: 10 mL

## 2023-12-12 NOTE — Progress Notes (Signed)
 [facility]   [phone] [phone]   Clinic Follow up Note   Patient Care Team: Erick Alley, DO as PCP - General (Family Medicine) Runell Gess, MD as PCP - Cardiology (Cardiology) York Spaniel, MD (Inactive) as Consulting Physician (Neurology) Malachy Mood, MD as Consulting Physician (Hematology and Oncology)  Date of Service:  12/12/2023  CHIEF COMPLAINT: f/u of breast cancer  CURRENT THERAPY:  Adjuvant chemotherapy AC every 2 weeks  Oncology History   Malignant neoplasm of upper-outer quadrant of left breast in female, estrogen receptor positive (HCC) -pT3N3aM0, stage IIIA, ER+/PR+/HER2- -diagnosed in 09/2023. Patient presented with a palpable left breast mass, ultrasound showed multiple enlarged left axillary lymph node. -Due to her underlying dementia, she underwent upfront surgery on 10/04/2023.  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 -I discussed the high risk of recurrence due to large number of positive lymph node, and the benefit of adjuvant chemotherapy, followed by adjuvant radiation, aromatase inhibitor and CDK4/6 inhibitor  -her staging CT and bone scan was negative for distant metastasis, except a 1cm left axillary node which is likely reactive to surgery, will f/u on CT in 6 months  -she started adjuvant chemo AC on 11/12/2023  Assessment and Plan    Breast cancer Undergoing chemotherapy for breast cancer, having completed two cycles. Reports hair loss, a common side effect, and mild anemia at 9.9 g/dL, slightly below normal range (12-15 g/dL). High risk of recurrence. Post-chemotherapy, transition to weekly treatment and anti-oxidant therapy planned. Radiation therapy will continue. - Proceed with cycle three of chemotherapy AC - Schedule weekly chemotherapy treatments paclitaxel  -We also reviewed adjuvant radiation and antiestrogen therapy after she completes chemotherapy -We also  discussed cancer monitoring down the road, due to her high risk, I will consider CT DNA testing and surveillance CT scans.   Chemotherapy-induced nail discoloration Nail discoloration on the hand due to chemotherapy, a common side effect. Does not cause pain. - Allow for a polish change to manage discoloration  Mild anemia secondary to chemotherapy Mild anemia with hemoglobin level of 9.9 g/dL, likely due to chemotherapy. Managed with dietary intake and supplements. - Advise intake of iron-rich foods such as spinach - Recommend prenatal vitamins for additional iron, B12, and folic acid  Chemotherapy-induced cognitive changes Reports mild memory loss, a potential side effect of chemotherapy. Encouraged to stay mentally active. - Encourage mental activities such as crossword puzzles     Plan -Lab reviewed, adequate for treatment, will proceed cycle 3 AC today with G-CSF in 2 days -Lab, follow-up and last cycle of AC in 2 weeks    SUMMARY OF ONCOLOGIC HISTORY: Oncology History  Infiltrating ductal carcinoma of breast (HCC)  09/22/2023 Initial Diagnosis   Infiltrating ductal carcinoma of breast (HCC)   11/12/2023 -  Chemotherapy   Patient is on Treatment Plan : BREAST DOSE DENSE AC q14d / PACLitaxel q7d     Malignant neoplasm of upper-outer quadrant of left breast in female, estrogen receptor positive (HCC)  09/28/2023 Initial Diagnosis   Malignant neoplasm of upper-outer quadrant of left breast in female, estrogen receptor positive (HCC)   09/28/2023 Cancer Staging   Staging form: Breast, AJCC 8th Edition - Clinical stage from 09/28/2023: Stage IIA (cT2, cN1, cM0, G2, ER+, PR+, HER2-) - Signed by Malachy Mood, MD on 09/28/2023 Histologic grading system: 3 grade system   10/04/2023 Cancer Staging   Staging form: Breast, AJCC 8th Edition - Pathologic stage from 10/04/2023: Stage IIIA (  pT2, pN3a, cM0, G2, ER+, PR+, HER2-) - Signed by Malachy Mood, MD on 10/19/2023 Histologic grading  system: 3 grade system Residual tumor (R): R0 - None   10/31/2023 Imaging   NM whole body bone scan IMPRESSION: No evidence skeletal metastasis.   10/31/2023 Imaging   CT chest abdomen and pelvis with contrast  IMPRESSION: 1. Status post lumpectomy of the upper outer left breast and left axillary lymph node dissection with associated simple attenuation fluid collections, most consistent with postoperative seromas. 2. Enlarged left axillary lymph node measuring 1.0 x 0.8 cm, nonspecific and possibly reactive in the setting of recent surgery. Correlate with tissue findings of lymph node dissection. Attention on follow-up. 3. No other evidence of lymphadenopathy or metastatic disease in the chest, abdomen, or pelvis. 4. Nonobstructive bilateral nephrolithiasis.   11/08/2023 Echocardiogram   Echocardiogram FINDINGS   Left Ventricle: Left ventricular ejection fraction, by estimation, is 55 to 60%. Left ventricular ejection fraction by 3D volume is 56 %. The left ventricle has normal function. The left ventricle has no regional wall motion abnormalities. Global longitudinal strain performed but not reported based on interpreter judgement due to suboptimal tracking. The left ventricular internal cavity size was mildly dilated. There is mild left ventricular hypertrophy. Left ventricular diastolic parameters are consistent with Grade I diastolic dysfunction (impaired relaxation). Indeterminate filling pressures.  Right Ventricle: The right ventricular size is normal. No increase in right ventricular wall thickness. Right ventricular systolic function is  low normal. There is normal pulmonary artery systolic pressure. The tricuspid regurgitant velocity is 2.12 m/s,  and with an assumed right atrial pressure of 3 mmHg, the estimated right ventricular systolic pressure is 21.0 mmHg.  Left Atrium: Left atrial size was normal in size.  Right Atrium: Right atrial size was normal in size.  Pericardium: There  is no evidence of pericardial effusion.  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 No evidence of mitral valve stenosis.  Tricuspid Valve: The tricuspid valve is normal in structure. Tricuspid valve regurgitation is mild . No evidence of tricuspid stenosis.  Aortic Valve: The aortic valve is tricuspid. Aortic valve regurgitation is not visualized. No aortic stenosis is present.  Pulmonic Valve: The pulmonic valve was normal in structure. Pulmonic valve regurgitation is mild. No evidence of pulmonic stenosis.  Aorta: The aortic root and ascending aorta are structurally normal, with no evidence of dilitation.  Venous: The inferior vena cava is normal in size with greater than 50% respiratory variability, suggesting right atrial pressure of 3 mmHg.  IAS/Shunts: There is redundancy of the interatrial septum. Cannot exclude a small PFO.        Discussed the use of AI scribe software for clinical note transcription with the patient, who gave verbal consent to proceed.  History of Present Illness   The patient, a 68 year old female with a history of breast cancer, presents for a follow-up visit after two cycles of chemotherapy. She reports discoloration on her hands and nails, which she attributes to the chemotherapy. She denies experiencing pain from this discoloration. She also reports hair loss, which occurred faster than expected. She denies consistent fatigue and maintains her routine activities. She reports a slight memory loss but is actively using her brain by doing crossword puzzles. She denies needing any medication refills and confirms that her nausea is well-controlled. She also reports a slight anemia, which she attributes to the chemotherapy.         All other systems were reviewed with the  patient and are negative.  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   Hyperlipidemia    Hyperparathyroidism (HCC) 07/25/2019   Hypertension    Normal cardiac stress test    Myoview stress test   OAB (overactive bladder) 09/01/2015   Osteopenia 05/2018   T score -1.1 FRAX 2.5%/ 0.1%   Subclinical hyperthyroidism 05/08/2007   Qualifier: Diagnosis of  By: Jimmey Ralph MD, Victorino Dike      SURGICAL HISTORY: Past Surgical History:  Procedure Laterality Date   BREAST BIOPSY Left 09/19/2023   x's 2   BREAST BIOPSY Left 09/19/2023   [place] LT BREAST BX W LOC DEV 1ST LESION IMG BX SPEC US GUIDE 09/19/2023 [facility]   BREAST BIOPSY Left 10/03/2023   [place] LT RADIOACTIVE SEED LOC 10/03/2023 [facility]   BREAST BIOPSY Left 10/03/2023   [place] LT RADIOACTIVE SEED EA ADD LESION 10/03/2023 [facility]   BREAST LUMPECTOMY WITH RADIOACTIVE SEED AND AXILLARY LYMPH NODE DISSECTION Left 10/04/2023   Procedure: LEFT BREAST LUMPECTOMY AND TARGETED LYMPH NODE DISSECTION;  Surgeon: Abigail Miyamoto, MD;  Location: [facility];  Service: General;  Laterality: Left;   COLONOSCOPY     IR IMAGING GUIDED PORT INSERTION  11/05/2023   POLYPECTOMY     TOTAL HIP ARTHROPLASTY Left 10/01/2013   DR ROWAN   TOTAL HIP ARTHROPLASTY Left 10/01/2013   Procedure: TOTAL HIP ARTHROPLASTY;  Surgeon: [name], MD;  Location: [facility];  Service: Orthopedics;  Laterality: Left;    I have reviewed the social history and family history with the patient and they are unchanged from previous note.  ALLERGIES:  is allergic to [facility] [aprepitant], hydrochlorothiazide, hydrocodone, and simvastatin.  MEDICATIONS:  Current Outpatient Medications  Medication Sig  Dispense Refill   albuterol (VENTOLIN HFA) 108 (90 Base) MCG/ACT inhaler USE 2 INHALATIONS BY MOUTH EVERY 6 HOURS AS NEEDED FOR WHEEZING  OR SHORTNESS OF BREATH (Patient taking differently: Inhale 2 puffs into the lungs every 6 (six) hours as needed for wheezing or shortness of breath.) 26.8 g 2   amLODipine (NORVASC) 10 MG tablet TAKE 1 TABLET BY MOUTH EVERY  MORNING 100 tablet 2   aspirin EC 81 MG tablet Take 81 mg by mouth daily. Swallow whole.     Budesonide 90 MCG/ACT inhaler Inhale 1 puff into the lungs 2 (two) times daily. 1 each 0   capsaicin (ZOSTRIX) 0.025 % cream Apply topically 2 (two) times daily. 60 g 0   cholecalciferol (VITAMIN D3) 25 MCG (1000 UNIT) tablet Take 1 tablet (1,000 Units total) by mouth daily. 7 tablet 0   Cholecalciferol (VITAMIN D3) 50 MCG (2000 UT) CHEW Chew 2,000 Units by mouth daily.     donepezil (ARICEPT) 10 MG tablet Take 10 mg by mouth at bedtime.     fluticasone (FLONASE) 50 MCG/ACT nasal spray USE 1 SPRAY IN BOTH NOSTRILS  DAILY (Patient taking differently: Place 1 spray into both nostrils daily as needed for allergies  or rhinitis.) 16 g 0   fluticasone (FLOVENT HFA) 110 MCG/ACT inhaler Inhale 1 puff into the lungs daily as needed. (Patient taking differently: Inhale 1 puff into the lungs daily as needed ("for flares").) 1 each 12   gabapentin (NEURONTIN) 100 MG capsule TAKE 1 CAPSULE BY MOUTH DAILY 90 capsule 3   lidocaine-prilocaine (EMLA) cream Apply to affected area once 30 g 3   loratadine (EQ ALLERGY RELIEF) 10 MG tablet Take 1 tablet (10 mg total) by mouth daily. (Patient taking differently: Take 10 mg by mouth daily as needed for allergies or rhinitis.) 90 tablet 3   losartan (COZAAR) 25 MG tablet Take 50 mg by mouth at bedtime.     losartan (COZAAR) 50 MG tablet Take 1 tablet (50 mg total) by mouth at bedtime. 90 tablet 3   meloxicam (MOBIC) 15 MG tablet Take 1 tablet by mouth once daily (Patient taking differently: Take 15 mg by mouth daily as needed  for pain.) 30 tablet 0   metoprolol tartrate (LOPRESSOR) 50 MG tablet TAKE 1 TABLET BY MOUTH TWICE  DAILY (Patient taking differently: Take 50 mg by mouth in the morning and at bedtime.) 200 tablet 2   ondansetron (ZOFRAN) 8 MG tablet Take 1 tab (8 mg) by mouth every 8 hrs as needed for nausea/vomiting. Start third day after doxorubicin/cyclophosphamide chemotherapy. 30 tablet 1   ondansetron (ZOFRAN-ODT) 4 MG disintegrating tablet Take 4 mg by mouth every 8 (eight) hours as needed for nausea or vomiting (dissolve orally).     prochlorperazine (COMPAZINE) 10 MG tablet Take 1 tablet (10 mg total) by mouth every 6 (six) hours as needed for nausea or vomiting. 30 tablet 1   rosuvastatin (CRESTOR) 5 MG tablet Take one tablet by mouth every Monday and Friday. (Patient taking differently: Take 5 mg by mouth See admin instructions. Take 5 mg by mouth every Monday and Friday) 20 tablet 4   sertraline (ZOLOFT) 50 MG tablet Take 1 tablet (50 mg total) by mouth daily. 30 tablet 0   traMADol (ULTRAM) 50 MG tablet Take 1 tablet (50 mg total) by mouth every 6 (six) hours as needed for moderate pain (pain score 4-6) or severe pain (pain score 7-10). 25 tablet 0   TYLENOL 500 MG tablet Take 500-1,000 mg by mouth every 6 (six) hours as needed for mild pain (pain score 1-3) or headache.     No current facility-administered medications for this visit.   Facility-Administered Medications Ordered in Other Visits  Medication Dose Route Frequency Provider Last Rate Last Admin   0.9 %  sodium chloride infusion   Intravenous Continuous Malachy Mood, MD   Stopped at 12/12/23 1246    PHYSICAL EXAMINATION: ECOG PERFORMANCE STATUS: 1 - Symptomatic but completely ambulatory  Vitals:   12/12/23 1037  BP: (!) 142/75  Pulse: 71  Resp: 15  Temp: 97.6 F (36.4 C)  SpO2: 99%   Wt Readings from Last 3 Encounters:  12/12/23 188 lb 12.8 oz (85.6 kg)  11/26/23 189 lb 12.8 oz (86.1 kg)  11/20/23 188 lb 2 oz (85.3 kg)      GENERAL:alert, no distress and comfortable SKIN: skin color, texture, turgor are normal, no rashes or significant lesions EYES: normal, Conjunctiva are pink and non-injected, sclera clear NECK: supple, thyroid normal size, non-tender, without nodularity LYMPH:  no palpable lymphadenopathy in the cervical, axillary  LUNGS: clear to auscultation and percussion with normal breathing effort HEART: regular rate & rhythm and no murmurs and no lower extremity  edema ABDOMEN:abdomen soft, non-tender and normal bowel sounds Musculoskeletal:no cyanosis of digits and no clubbing  NEURO: alert & oriented x 3 with fluent speech, no focal motor/sensory deficits    LABORATORY DATA:  I have reviewed the data as listed    Latest Ref Rng & Units 12/12/2023   10:05 AM 11/26/2023    9:33 AM 11/20/2023    9:41 AM  CBC  WBC 4.0 - 10.5 K/uL 6.7  7.3  3.9   Hemoglobin 12.0 - 15.0 g/dL 9.9  78.2  95.6   Hematocrit 36.0 - 46.0 % 31.1  32.5  33.1   Platelets 150 - 400 K/uL 282  174  132         Latest Ref Rng & Units 12/12/2023   10:05 AM 11/26/2023    9:33 AM 11/20/2023    9:41 AM  CMP  Glucose 70 - 99 mg/dL 213  086  578   BUN 8 - 23 mg/dL 7  7  7    Creatinine 0.44 - 1.00 mg/dL 4.69  6.29  5.28   Sodium 135 - 145 mmol/L 144  142  141   Potassium 3.5 - 5.1 mmol/L 3.5  4.2  3.7   Chloride 98 - 111 mmol/L 110  111  109   CO2 22 - 32 mmol/L 29  27  26    Calcium 8.9 - 10.3 mg/dL 41.3  24.4  01.0   Total Protein 6.5 - 8.1 g/dL 6.3  6.4  6.4   Total Bilirubin 0.0 - 1.2 mg/dL 0.3  0.2  0.3   Alkaline Phos 38 - 126 U/L 122  125  122   AST 15 - 41 U/L 14  13  10    ALT 0 - 44 U/L 12  10  10        RADIOGRAPHIC STUDIES: I have personally reviewed the radiological images as listed and agreed with the findings in the report. No results found.    Orders Placed This Encounter  Procedures   CBC with Differential (Cancer Center Only)    Standing Status:   Future    Expected Date:   01/09/2024     Expiration Date:   01/08/2025   CMP (Cancer Center only)    Standing Status:   Future    Expected Date:   01/09/2024    Expiration Date:   01/08/2025   CBC with Differential (Cancer Center Only)    Standing Status:   Future    Expected Date:   01/16/2024    Expiration Date:   01/15/2025   CMP (Cancer Center only)    Standing Status:   Future    Expected Date:   01/16/2024    Expiration Date:   01/15/2025   CBC with Differential (Cancer Center Only)    Standing Status:   Future    Expected Date:   01/23/2024    Expiration Date:   01/22/2025   CMP (Cancer Center only)    Standing Status:   Future    Expected Date:   01/23/2024    Expiration Date:   01/22/2025   CBC with Differential (Cancer Center Only)    Standing Status:   Future    Expected Date:   01/30/2024    Expiration Date:   01/29/2025   CMP (Cancer Center only)    Standing Status:   Future    Expected Date:   01/30/2024    Expiration Date:   01/29/2025   All questions were answered. The patient  knows to call the clinic with any problems, questions or concerns. No barriers to learning was detected. The total time spent in the appointment was 30 minutes.     Malachy Mood, MD 12/12/2023

## 2023-12-12 NOTE — Patient Instructions (Signed)
 [facility] - A DEPT OF [facility]  Discharge Instructions: Thank you for choosing [facility] to provide your oncology and hematology care.   If you have a lab appointment with the Cancer Center, please go directly to the Cancer Center and check in at the registration area.   Wear comfortable clothing and clothing appropriate for easy access to any Portacath or PICC line.   We strive to give you quality time with your provider. You may need to reschedule your appointment if you arrive late (15 or more minutes).  Arriving late affects you and other patients whose appointments are after yours.  Also, if you miss three or more appointments without notifying the office, you may be dismissed from the clinic at the provider's discretion.      For prescription refill requests, have your pharmacy contact our office and allow 72 hours for refills to be completed.    Today you received the following chemotherapy and/or immunotherapy agents: Adriamycin, Cytoxan.       To help prevent nausea and vomiting after your treatment, we encourage you to take your nausea medication as directed.  BELOW ARE SYMPTOMS THAT SHOULD BE REPORTED IMMEDIATELY: *FEVER GREATER THAN 100.4 F (38 C) OR HIGHER *CHILLS OR SWEATING *NAUSEA AND VOMITING THAT IS NOT CONTROLLED WITH YOUR NAUSEA MEDICATION *UNUSUAL SHORTNESS OF BREATH *UNUSUAL BRUISING OR BLEEDING *URINARY PROBLEMS (pain or burning when urinating, or frequent urination) *BOWEL PROBLEMS (unusual diarrhea, constipation, pain near the anus) TENDERNESS IN MOUTH AND THROAT WITH OR WITHOUT PRESENCE OF ULCERS (sore throat, sores in mouth, or a toothache) UNUSUAL RASH, SWELLING OR PAIN  UNUSUAL VAGINAL DISCHARGE OR ITCHING   Items with * indicate a potential emergency and should be followed up as soon as possible or go to the Emergency Department if any problems should occur.  Please show the CHEMOTHERAPY ALERT CARD or  IMMUNOTHERAPY ALERT CARD at check-in to the Emergency Department and triage nurse.  Should you have questions after your visit or need to cancel or reschedule your appointment, please contact [facility] - A DEPT OF [facility]  Dept: [phone]  and follow the prompts.  Office hours are 8:00 a.m. to 4:30 p.m. Monday - Friday. Please note that voicemails left after 4:00 p.m. may not be returned until the following business day.  We are closed weekends and major holidays. You have access to a nurse at all times for urgent questions. Please call the main number to the clinic Dept: [phone] and follow the prompts.   For any non-urgent questions, you may also contact your provider using MyChart. We now offer e-Visits for anyone 60 and older to request care online for non-urgent symptoms. For details visit [email or web address].   Also download the MyChart app! Go to the app store, search "MyChart", open the app, select [place], and log in with your MyChart username and password.

## 2023-12-14 ENCOUNTER — Inpatient Hospital Stay: Payer: 59

## 2023-12-14 VITALS — BP 143/75 | HR 75 | Temp 98.5°F | Resp 18

## 2023-12-14 DIAGNOSIS — Z7982 Long term (current) use of aspirin: Secondary | ICD-10-CM | POA: Diagnosis not present

## 2023-12-14 DIAGNOSIS — C50412 Malignant neoplasm of upper-outer quadrant of left female breast: Secondary | ICD-10-CM | POA: Diagnosis not present

## 2023-12-14 DIAGNOSIS — C50912 Malignant neoplasm of unspecified site of left female breast: Secondary | ICD-10-CM

## 2023-12-14 DIAGNOSIS — C773 Secondary and unspecified malignant neoplasm of axilla and upper limb lymph nodes: Secondary | ICD-10-CM | POA: Diagnosis not present

## 2023-12-14 DIAGNOSIS — Z5111 Encounter for antineoplastic chemotherapy: Secondary | ICD-10-CM | POA: Diagnosis not present

## 2023-12-14 DIAGNOSIS — Z79899 Other long term (current) drug therapy: Secondary | ICD-10-CM | POA: Diagnosis not present

## 2023-12-14 DIAGNOSIS — Z1732 Human epidermal growth factor receptor 2 negative status: Secondary | ICD-10-CM | POA: Diagnosis not present

## 2023-12-14 DIAGNOSIS — Z170 Estrogen receptor positive status [ER+]: Secondary | ICD-10-CM | POA: Diagnosis not present

## 2023-12-14 DIAGNOSIS — Z5189 Encounter for other specified aftercare: Secondary | ICD-10-CM | POA: Diagnosis not present

## 2023-12-14 DIAGNOSIS — Z1721 Progesterone receptor positive status: Secondary | ICD-10-CM | POA: Diagnosis not present

## 2023-12-14 MED ORDER — PEGFILGRASTIM-CBQV 6 MG/0.6ML ~~LOC~~ SOSY
6.0000 mg | PREFILLED_SYRINGE | Freq: Once | SUBCUTANEOUS | Status: AC
  Administered 2023-12-14: 6 mg via SUBCUTANEOUS
  Filled 2023-12-14: qty 0.6

## 2023-12-17 ENCOUNTER — Ambulatory Visit (INDEPENDENT_AMBULATORY_CARE_PROVIDER_SITE_OTHER): Payer: Self-pay | Admitting: Student

## 2023-12-17 ENCOUNTER — Encounter: Payer: Self-pay | Admitting: Student

## 2023-12-17 ENCOUNTER — Other Ambulatory Visit: Payer: Self-pay

## 2023-12-17 VITALS — BP 133/76 | HR 77 | Ht 62.0 in | Wt 184.0 lb

## 2023-12-17 DIAGNOSIS — I10 Essential (primary) hypertension: Secondary | ICD-10-CM

## 2023-12-17 DIAGNOSIS — Z23 Encounter for immunization: Secondary | ICD-10-CM | POA: Diagnosis not present

## 2023-12-17 DIAGNOSIS — R4589 Other symptoms and signs involving emotional state: Secondary | ICD-10-CM

## 2023-12-17 DIAGNOSIS — M1610 Unilateral primary osteoarthritis, unspecified hip: Secondary | ICD-10-CM

## 2023-12-17 MED ORDER — LOSARTAN POTASSIUM 50 MG PO TABS: 50.0000 mg | ORAL_TABLET | Freq: Every day | ORAL | 3 refills | Status: AC

## 2023-12-17 MED ORDER — ZOSTER VAC RECOMB ADJUVANTED 50 MCG/0.5ML IM SUSR: 0.5000 mL | Freq: Once | INTRAMUSCULAR | 0 refills | Status: AC

## 2023-12-17 NOTE — Patient Instructions (Addendum)
 It was great to see you! Thank you for allowing me to participate in your care!   Our plans for today:  - You received COVID vaccine today.  - Schedule RN only visit within the next 2 weeks for pneumococcal vaccines - For shingles vaccine, you can go to the pharmacy at [facility] in [place]  - Go to [email or web address] to look for therapist in the area if desired - Go to support groups as a family when able - for pain tylenol and voltaren are okay to use as needed - Call to schedule apt to discuss colonoscopy - [facility] [address] [place]oon [phone]  We are checking some labs today, I will call you if they are abnormal will send you a MyChart message or a letter if they are normal.  If you do not hear about your labs in the next 2 weeks please let us know.  Take care and seek immediate care sooner if you develop any concerns.   Dr. Erick Alley, DO [facility]

## 2023-12-17 NOTE — Assessment & Plan Note (Signed)
 In setting of new cancer diagnosis and lost her husband last year. Denies any SI.  Has good family support, lives with one of her daughters and sees her family including grandchildren often. -Continue Zoloft 50 mg.  Can increase dose to 100 mg next month if needed -Encourage establishing care with therapist, discussed how to use [email or web address] -Encourage going to support groups and spending lots of time with her grandkids -Return as needed

## 2023-12-17 NOTE — Progress Notes (Signed)
    SUBJECTIVE:   CHIEF COMPLAINT / HPI:   Depressed Mood In setting of a breast cancer diagnosis and currently undergoing chemotherapy.  Discussed plans to establish care with therapy at last apt. Was previously prescribed Zoloft 50 mg but did not increase from 25 to 50 mg dose until yesterday. Today, she says the cancer diagnosis is just now sinking in.  Feels like until now she is just been going through the motions.  Now she has lost her hair and having side effects from chemo and it is feeling real.  She acknowledges the support she is receiving from her daughters and is grateful for that.  Says she can still find joy in spending time with her grandchildren on a regular basis.  Denies any SI.  Has been talking over the phone with someone she knew through the hospice program from when her husband passed which has been helpful. Both of her daughters are accompanying her to the appointment today and states they plan to start taking her to a support group at the cancer center.   PERTINENT  PMH / PSH:   OBJECTIVE:   BP 133/76   Pulse 77   Ht 5\' 2"  (1.575 m)   Wt 184 lb (83.5 kg)   SpO2 98%   BMI 33.65 kg/m    General: NAD, pleasant, tearful throughout Cardiac: Well-perfused Respiratory: Breathing comfortably on room air Neuro: alert, no obvious focal deficits Psych: Tearful, mood and affect appropriate for situation  ASSESSMENT/PLAN:   Depressed mood In setting of new cancer diagnosis and lost her husband last year. Denies any SI.  Has good family support, lives with one of her daughters and sees her family including grandchildren often. -Continue Zoloft 50 mg.  Can increase dose to 100 mg next month if needed -Encourage establishing care with therapist, discussed how to use [email or web address] -Encourage going to support groups and spending lots of time with her grandkids -Return as needed   Health maintenance: Discussed vaccines over staff messages with Dr. Mosetta Putt he states  it is okay for her to receive vaccinations but only 1 at a time and as long as she is feeling okay. -COVID-vaccine today -Plan for RN appointment for pneumococcal vaccine within the next 1 to 2 weeks -Rx for shingles vaccine to pharmacy  Dr. Erick Alley, DO [facility]

## 2023-12-20 ENCOUNTER — Other Ambulatory Visit: Payer: Self-pay

## 2023-12-23 NOTE — Assessment & Plan Note (Signed)
-  pT3N3aM0, stage IIIA, ER+/PR+/HER2- -diagnosed in 09/2023. Patient presented with a palpable left breast mass, ultrasound showed multiple enlarged left axillary lymph node. -Due to her underlying dementia, she underwent upfront surgery on 10/04/2023.  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 -I discussed the high risk of recurrence due to large number of positive lymph node, and the benefit of adjuvant chemotherapy, followed by adjuvant radiation, aromatase inhibitor and CDK4/6 inhibitor  -her staging CT and bone scan was negative for distant metastasis, except a 1cm left axillary node which is likely reactive to surgery, will f/u on CT in 6 months  -she started adjuvant chemo AC on 11/12/2023

## 2023-12-24 ENCOUNTER — Inpatient Hospital Stay: Payer: 59 | Attending: Hematology

## 2023-12-24 ENCOUNTER — Encounter: Payer: Self-pay | Admitting: Hematology

## 2023-12-24 ENCOUNTER — Other Ambulatory Visit: Payer: Self-pay

## 2023-12-24 ENCOUNTER — Inpatient Hospital Stay (HOSPITAL_BASED_OUTPATIENT_CLINIC_OR_DEPARTMENT_OTHER): Payer: 59 | Admitting: Hematology

## 2023-12-24 ENCOUNTER — Inpatient Hospital Stay: Payer: 59

## 2023-12-24 VITALS — BP 118/78 | HR 67 | Temp 97.2°F | Resp 16 | Ht 62.0 in | Wt 185.8 lb

## 2023-12-24 DIAGNOSIS — E876 Hypokalemia: Secondary | ICD-10-CM | POA: Diagnosis not present

## 2023-12-24 DIAGNOSIS — Z79899 Other long term (current) drug therapy: Secondary | ICD-10-CM | POA: Insufficient documentation

## 2023-12-24 DIAGNOSIS — D6481 Anemia due to antineoplastic chemotherapy: Secondary | ICD-10-CM | POA: Insufficient documentation

## 2023-12-24 DIAGNOSIS — Z7982 Long term (current) use of aspirin: Secondary | ICD-10-CM | POA: Diagnosis not present

## 2023-12-24 DIAGNOSIS — C50412 Malignant neoplasm of upper-outer quadrant of left female breast: Secondary | ICD-10-CM | POA: Diagnosis not present

## 2023-12-24 DIAGNOSIS — T451X5A Adverse effect of antineoplastic and immunosuppressive drugs, initial encounter: Secondary | ICD-10-CM | POA: Insufficient documentation

## 2023-12-24 DIAGNOSIS — C773 Secondary and unspecified malignant neoplasm of axilla and upper limb lymph nodes: Secondary | ICD-10-CM | POA: Diagnosis not present

## 2023-12-24 DIAGNOSIS — Z5111 Encounter for antineoplastic chemotherapy: Secondary | ICD-10-CM | POA: Diagnosis not present

## 2023-12-24 DIAGNOSIS — Z1721 Progesterone receptor positive status: Secondary | ICD-10-CM | POA: Diagnosis not present

## 2023-12-24 DIAGNOSIS — Z7951 Long term (current) use of inhaled steroids: Secondary | ICD-10-CM | POA: Insufficient documentation

## 2023-12-24 DIAGNOSIS — Z170 Estrogen receptor positive status [ER+]: Secondary | ICD-10-CM | POA: Insufficient documentation

## 2023-12-24 DIAGNOSIS — Z5189 Encounter for other specified aftercare: Secondary | ICD-10-CM | POA: Diagnosis not present

## 2023-12-24 DIAGNOSIS — Z1732 Human epidermal growth factor receptor 2 negative status: Secondary | ICD-10-CM | POA: Insufficient documentation

## 2023-12-24 DIAGNOSIS — C50912 Malignant neoplasm of unspecified site of left female breast: Secondary | ICD-10-CM

## 2023-12-24 DIAGNOSIS — Z95828 Presence of other vascular implants and grafts: Secondary | ICD-10-CM

## 2023-12-24 LAB — CBC WITH DIFFERENTIAL (CANCER CENTER ONLY)
Abs Immature Granulocytes: 0.61 10*3/uL — ABNORMAL HIGH (ref 0.00–0.07)
Basophils Absolute: 0.2 10*3/uL — ABNORMAL HIGH (ref 0.0–0.1)
Basophils Relative: 2 %
Eosinophils Absolute: 0.1 10*3/uL (ref 0.0–0.5)
Eosinophils Relative: 1 %
HCT: 28.6 % — ABNORMAL LOW (ref 36.0–46.0)
Hemoglobin: 9.3 g/dL — ABNORMAL LOW (ref 12.0–15.0)
Immature Granulocytes: 9 %
Lymphocytes Relative: 24 %
Lymphs Abs: 1.7 10*3/uL (ref 0.7–4.0)
MCH: 29.1 pg (ref 26.0–34.0)
MCHC: 32.5 g/dL (ref 30.0–36.0)
MCV: 89.4 fL (ref 80.0–100.0)
Monocytes Absolute: 0.8 10*3/uL (ref 0.1–1.0)
Monocytes Relative: 11 %
Neutro Abs: 3.8 10*3/uL (ref 1.7–7.7)
Neutrophils Relative %: 53 %
Platelet Count: 143 10*3/uL — ABNORMAL LOW (ref 150–400)
RBC: 3.2 MIL/uL — ABNORMAL LOW (ref 3.87–5.11)
RDW: 15.6 % — ABNORMAL HIGH (ref 11.5–15.5)
WBC Count: 7.1 10*3/uL (ref 4.0–10.5)
nRBC: 0.4 % — ABNORMAL HIGH (ref 0.0–0.2)

## 2023-12-24 LAB — CMP (CANCER CENTER ONLY)
ALT: 10 U/L (ref 0–44)
AST: 13 U/L — ABNORMAL LOW (ref 15–41)
Albumin: 3.7 g/dL (ref 3.5–5.0)
Alkaline Phosphatase: 125 U/L (ref 38–126)
Anion gap: 4 — ABNORMAL LOW (ref 5–15)
BUN: 7 mg/dL — ABNORMAL LOW (ref 8–23)
CO2: 28 mmol/L (ref 22–32)
Calcium: 9.7 mg/dL (ref 8.9–10.3)
Chloride: 110 mmol/L (ref 98–111)
Creatinine: 0.64 mg/dL (ref 0.44–1.00)
GFR, Estimated: >60 mL/min (ref >60)
Glucose, Bld: 123 mg/dL — ABNORMAL HIGH (ref 70–99)
Potassium: 3.4 mmol/L — ABNORMAL LOW (ref 3.5–5.1)
Sodium: 142 mmol/L (ref 135–145)
Total Bilirubin: 0.2 mg/dL (ref 0.0–1.2)
Total Protein: 6.1 g/dL — ABNORMAL LOW (ref 6.5–8.1)

## 2023-12-24 MED ORDER — PALONOSETRON HCL INJECTION 0.25 MG/5ML
0.2500 mg | Freq: Once | INTRAVENOUS | Status: AC
  Administered 2023-12-24: 0.25 mg via INTRAVENOUS
  Filled 2023-12-24: qty 5

## 2023-12-24 MED ORDER — DOXORUBICIN HCL CHEMO IV INJECTION 2 MG/ML
60.0000 mg/m2 | Freq: Once | INTRAVENOUS | Status: AC
  Administered 2023-12-24: 116 mg via INTRAVENOUS
  Filled 2023-12-24: qty 58

## 2023-12-24 MED ORDER — SODIUM CHLORIDE 0.9% FLUSH
10.0000 mL | Freq: Once | INTRAVENOUS | Status: AC
Start: 2023-12-24 — End: 2023-12-24
  Administered 2023-12-24: 10 mL

## 2023-12-24 MED ORDER — CYCLOPHOSPHAMIDE CHEMO INJECTION 1 GM
600.0000 mg/m2 | Freq: Once | INTRAMUSCULAR | Status: AC
  Administered 2023-12-24: 1160 mg via INTRAVENOUS
  Filled 2023-12-24: qty 58

## 2023-12-24 MED ORDER — DEXAMETHASONE SODIUM PHOSPHATE 10 MG/ML IJ SOLN
10.0000 mg | Freq: Once | INTRAMUSCULAR | Status: AC
Start: 2023-12-24 — End: 2023-12-24
  Administered 2023-12-24: 10 mg via INTRAVENOUS
  Filled 2023-12-24: qty 1

## 2023-12-24 MED ORDER — SODIUM CHLORIDE 0.9 % IV SOLN: INTRAVENOUS | Status: DC

## 2023-12-24 NOTE — Progress Notes (Signed)
 [facility]   [phone] [phone]   Clinic Follow up Note   Patient Care Team: Erick Alley, DO as PCP - General (Family Medicine) Runell Gess, MD as PCP - Cardiology (Cardiology) York Spaniel, MD (Inactive) as Consulting Physician (Neurology) Malachy Mood, MD as Consulting Physician (Hematology and Oncology)  Date of Service:  12/24/2023  CHIEF COMPLAINT: f/u of breast cancer  CURRENT THERAPY:  Adjuvant chemotherapy AC  Oncology History   Malignant neoplasm of upper-outer quadrant of left breast in female, estrogen receptor positive (HCC) -pT3N3aM0, stage IIIA, ER+/PR+/HER2- -diagnosed in 09/2023. Patient presented with a palpable left breast mass, ultrasound showed multiple enlarged left axillary lymph node. -Due to her underlying dementia, she underwent upfront surgery on 10/04/2023.  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 -I discussed the high risk of recurrence due to large number of positive lymph node, and the benefit of adjuvant chemotherapy, followed by adjuvant radiation, aromatase inhibitor and CDK4/6 inhibitor  -her staging CT and bone scan was negative for distant metastasis, except a 1cm left axillary node which is likely reactive to surgery, will f/u on CT in 6 months  -she started adjuvant chemo AC on 11/12/2023    Assessment and Plan    Breast cancer Undergoing chemotherapy for breast cancer.  Today is her cycle four of the current AC regimen and will transition to weekly paclitaxel for 12 weeks. Current regimen is intensive, causing fatigue, nausea, anorexia, and alopecia. Upcoming paclitaxel treatment is expected to be less intense, with potential side effects including fatigue and neuropathy. 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 - Administer chemotherapy today - Transition to weekly paclitaxel  for 12 weeks - Monitor for side effects such as fatigue and neuropathy - Provide emotional support and encouragement  Chemotherapy-induced anemia Mild anemia with a hemoglobin level of 9.3 g/dL, likely due to chemotherapy. No blood transfusion needed at this time. - Monitor hemoglobin levels - Continue current management without transfusion  Chemotherapy-induced cognitive changes Experiences short-term memory loss, a potential side effect of chemotherapy. Engaging in activities to stimulate cognitive function, such as puzzles and reading. - Encourage cognitive activities like puzzles and reading - Monitor cognitive function  Post-surgical breast discomfort Reports tingling and discomfort at the site of breast surgery, likely due to nerve damage. Common post-surgical symptom. - Provide reassurance about common post-surgical nerve damage  Hypokalemia Low potassium levels for the first time. May be related to chemotherapy or dietary factors. - Increase dietary potassium intake with foods like bananas, oranges, and nuts - Consider electrolyte drinks like Gatorade or Pedialyte     Plan -Lab reviewed, adequate for treatment, will proceed last cycle AC today, with G-CSF in 2 days -Follow-up in 2 weeks to start weekly Taxol    SUMMARY OF ONCOLOGIC HISTORY: Oncology History  Infiltrating ductal carcinoma of breast (HCC)  09/22/2023 Initial Diagnosis   Infiltrating ductal carcinoma of breast (HCC)   11/12/2023 -  Chemotherapy   Patient is on Treatment Plan : BREAST DOSE DENSE AC q14d / PACLitaxel q7d     Malignant neoplasm of upper-outer quadrant of left breast in female, estrogen receptor positive (HCC)  09/28/2023 Initial Diagnosis   Malignant neoplasm of upper-outer quadrant of left breast in female, estrogen receptor positive (HCC)   09/28/2023 Cancer Staging   Staging form: Breast, AJCC 8th Edition - Clinical stage from 09/28/2023: Stage IIA (cT2, cN1, cM0, G2,  ER+, PR+, HER2-) -  Signed by Malachy Mood, MD on 09/28/2023 Histologic grading system: 3 grade system   10/04/2023 Cancer Staging   Staging form: Breast, AJCC 8th Edition - Pathologic stage from 10/04/2023: Stage IIIA (pT2, pN3a, cM0, G2, ER+, PR+, HER2-) - Signed by Malachy Mood, MD on 10/19/2023 Histologic grading system: 3 grade system Residual tumor (R): R0 - None   10/31/2023 Imaging   NM whole body bone scan IMPRESSION: No evidence skeletal metastasis.   10/31/2023 Imaging   CT chest abdomen and pelvis with contrast  IMPRESSION: 1. Status post lumpectomy of the upper outer left breast and left axillary lymph node dissection with associated simple attenuation fluid collections, most consistent with postoperative seromas. 2. Enlarged left axillary lymph node measuring 1.0 x 0.8 cm, nonspecific and possibly reactive in the setting of recent surgery. Correlate with tissue findings of lymph node dissection. Attention on follow-up. 3. No other evidence of lymphadenopathy or metastatic disease in the chest, abdomen, or pelvis. 4. Nonobstructive bilateral nephrolithiasis.   11/08/2023 Echocardiogram   Echocardiogram FINDINGS   Left Ventricle: Left ventricular ejection fraction, by estimation, is 55 to 60%. Left ventricular ejection fraction by 3D volume is 56 %. The left ventricle has normal function. The left ventricle has no regional wall motion abnormalities. Global longitudinal strain performed but not reported based on interpreter judgement due to suboptimal tracking. The left ventricular internal cavity size was mildly dilated. There is mild left ventricular hypertrophy. Left ventricular diastolic parameters are consistent with Grade I diastolic dysfunction (impaired relaxation). Indeterminate filling pressures.  Right Ventricle: The right ventricular size is normal. No increase in right ventricular wall thickness. Right ventricular systolic function is  low normal. There is normal pulmonary artery systolic  pressure. The tricuspid regurgitant velocity is 2.12 m/s,  and with an assumed right atrial pressure of 3 mmHg, the estimated right ventricular systolic pressure is 21.0 mmHg.  Left Atrium: Left atrial size was normal in size.  Right Atrium: Right atrial size was normal in size.  Pericardium: There is no evidence of pericardial effusion.  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 No evidence of mitral valve stenosis.  Tricuspid Valve: The tricuspid valve is normal in structure. Tricuspid valve regurgitation is mild . No evidence of tricuspid stenosis.  Aortic Valve: The aortic valve is tricuspid. Aortic valve regurgitation is not visualized. No aortic stenosis is present.  Pulmonic Valve: The pulmonic valve was normal in structure. Pulmonic valve regurgitation is mild. No evidence of pulmonic stenosis.  Aorta: The aortic root and ascending aorta are structurally normal, with no evidence of dilitation.  Venous: The inferior vena cava is normal in size with greater than 50% respiratory variability, suggesting right atrial pressure of 3 mmHg.  IAS/Shunts: There is redundancy of the interatrial septum. Cannot exclude a small PFO.        Discussed the use of AI scribe software for clinical note transcription with the patient, who gave verbal consent to proceed.  History of Present Illness   The patient, a 68 year old female with a history of breast cancer, is currently undergoing chemotherapy. She reports that the treatment has been physically and emotionally challenging. She has completed three cycles of chemotherapy and is about to start her fourth cycle. She describes experiencing loss of appetite, fatigue, nausea, and hair loss as a result of the treatment. She also mentions a change in her skin color. Despite these challenges, she is determined to continue with the treatment.  In  addition to the cancer treatment, the patient reports some discomfort in the  area of her surgical incision in the breast, which she describes as a tingling sensation. She also mentions experiencing some short-term memory loss, which she is managing by engaging in mental activities such as word puzzles and reading.         All other systems were reviewed with the patient and are negative.  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   Hyperlipidemia    Hyperparathyroidism (HCC) 07/25/2019   Hypertension    Normal cardiac stress test    Myoview stress test   OAB (overactive bladder) 09/01/2015   Osteopenia 05/2018   T score -1.1 FRAX 2.5%/ 0.1%   Subclinical hyperthyroidism 05/08/2007   Qualifier: Diagnosis of  By: Jimmey Ralph MD, Victorino Dike      SURGICAL HISTORY: Past Surgical History:  Procedure Laterality Date   BREAST BIOPSY Left 09/19/2023   x's 2   BREAST BIOPSY Left 09/19/2023   [place] LT BREAST BX W LOC DEV 1ST LESION IMG BX SPEC US GUIDE 09/19/2023 [facility]   BREAST BIOPSY Left 10/03/2023   [place] LT RADIOACTIVE SEED LOC 10/03/2023 [facility]   BREAST BIOPSY Left 10/03/2023   [place] LT RADIOACTIVE SEED EA ADD LESION 10/03/2023 [facility]   BREAST LUMPECTOMY WITH RADIOACTIVE SEED AND AXILLARY LYMPH NODE DISSECTION Left 10/04/2023   Procedure: LEFT BREAST LUMPECTOMY AND TARGETED LYMPH NODE DISSECTION;  Surgeon: Abigail Miyamoto, MD;  Location: [facility];  Service: General;  Laterality: Left;   COLONOSCOPY     IR IMAGING GUIDED PORT INSERTION  11/05/2023   POLYPECTOMY     TOTAL HIP ARTHROPLASTY Left 10/01/2013   DR ROWAN    TOTAL HIP ARTHROPLASTY Left 10/01/2013   Procedure: TOTAL HIP ARTHROPLASTY;  Surgeon: [name], MD;  Location: [facility] OR;  Service: Orthopedics;  Laterality: Left;    I have reviewed the social history and family history with the patient and they are unchanged from previous note.  ALLERGIES:  is allergic to [facility] [aprepitant], hydrochlorothiazide, hydrocodone, and simvastatin.  MEDICATIONS:  Current Outpatient Medications  Medication Sig Dispense Refill   albuterol (VENTOLIN HFA) 108 (90 Base) MCG/ACT inhaler USE 2 INHALATIONS BY MOUTH EVERY 6 HOURS AS NEEDED FOR WHEEZING  OR SHORTNESS OF BREATH (Patient taking differently: Inhale 2 puffs into the lungs every 6 (six) hours as needed for wheezing or shortness of breath.) 26.8 g 2   amLODipine (NORVASC) 10 MG tablet TAKE 1 TABLET BY MOUTH EVERY  MORNING 100 tablet 2   aspirin EC 81 MG tablet Take 81 mg by mouth daily. Swallow whole.     Budesonide 90 MCG/ACT inhaler Inhale 1 puff into the lungs 2 (two) times daily. 1 each 0   capsaicin (ZOSTRIX) 0.025 % cream Apply topically 2 (two) times daily. 60 g 0   cholecalciferol (VITAMIN D3) 25 MCG (1000 UNIT) tablet Take 1 tablet (1,000 Units total) by mouth daily. 7 tablet  0   Cholecalciferol (VITAMIN D3) 50 MCG (2000 UT) CHEW Chew 2,000 Units by mouth daily.     donepezil (ARICEPT) 10 MG tablet Take 10 mg by mouth at bedtime.     fluticasone (FLONASE) 50 MCG/ACT nasal spray USE 1 SPRAY IN BOTH NOSTRILS  DAILY (Patient taking differently: Place 1 spray into both nostrils daily as needed for allergies or rhinitis.) 16 g 0   fluticasone (FLOVENT HFA) 110 MCG/ACT inhaler Inhale 1 puff into the lungs daily as needed. (Patient taking differently: Inhale 1 puff into the lungs daily as needed ("for flares").) 1 each 12   gabapentin (NEURONTIN) 100 MG capsule TAKE 1 CAPSULE BY MOUTH DAILY 90 capsule 3   lidocaine-prilocaine (EMLA) cream Apply to affected area once 30 g 3   loratadine (EQ ALLERGY RELIEF) 10 MG  tablet Take 1 tablet (10 mg total) by mouth daily. (Patient taking differently: Take 10 mg by mouth daily as needed for allergies or rhinitis.) 90 tablet 3   losartan (COZAAR) 50 MG tablet Take 1 tablet (50 mg total) by mouth at bedtime. 90 tablet 3   metoprolol tartrate (LOPRESSOR) 50 MG tablet TAKE 1 TABLET BY MOUTH TWICE  DAILY (Patient taking differently: Take 50 mg by mouth in the morning and at bedtime.) 200 tablet 2   ondansetron (ZOFRAN) 8 MG tablet Take 1 tab (8 mg) by mouth every 8 hrs as needed for nausea/vomiting. Start third day after doxorubicin/cyclophosphamide chemotherapy. 30 tablet 1   ondansetron (ZOFRAN-ODT) 4 MG disintegrating tablet Take 4 mg by mouth every 8 (eight) hours as needed for nausea or vomiting (dissolve orally).     prochlorperazine (COMPAZINE) 10 MG tablet Take 1 tablet (10 mg total) by mouth every 6 (six) hours as needed for nausea or vomiting. 30 tablet 1   rosuvastatin (CRESTOR) 5 MG tablet Take one tablet by mouth every Monday and Friday. (Patient taking differently: Take 5 mg by mouth See admin instructions. Take 5 mg by mouth every Monday and Friday) 20 tablet 4   sertraline (ZOLOFT) 50 MG tablet Take 1 tablet (50 mg total) by mouth daily. 30 tablet 0   traMADol (ULTRAM) 50 MG tablet Take 1 tablet (50 mg total) by mouth every 6 (six) hours as needed for moderate pain (pain score 4-6) or severe pain (pain score 7-10). 25 tablet 0   TYLENOL 500 MG tablet Take 500-1,000 mg by mouth every 6 (six) hours as needed for mild pain (pain score 1-3) or headache.     No current facility-administered medications for this visit.    PHYSICAL EXAMINATION: ECOG PERFORMANCE STATUS: 1 - Symptomatic but completely ambulatory  Vitals:   12/24/23 1007  BP: 118/78  Pulse: 67  Resp: 16  Temp: (!) 97.2 F (36.2 C)  SpO2: 99%   Wt Readings from Last 3 Encounters:  12/24/23 185 lb 12.8 oz (84.3 kg)  12/17/23 184 lb (83.5 kg)  12/12/23 188 lb 12.8 oz (85.6 kg)      GENERAL:alert, no distress and comfortable SKIN: skin color, texture, turgor are normal, no rashes or significant lesions EYES: normal, Conjunctiva are pink and non-injected, sclera clear NECK: supple, thyroid normal size, non-tender, without nodularity LYMPH:  no palpable lymphadenopathy in the cervical, axillary  LUNGS: clear to auscultation and percussion with normal breathing effort HEART: regular rate & rhythm and no murmurs and no lower extremity edema ABDOMEN:abdomen soft, non-tender and normal bowel sounds Musculoskeletal:no cyanosis of digits and no clubbing  NEURO: alert & oriented x 3  with fluent speech, no focal motor/sensory deficits    LABORATORY DATA:  I have reviewed the data as listed    Latest Ref Rng & Units 12/24/2023    9:33 AM 12/12/2023   10:05 AM 11/26/2023    9:33 AM  CBC  WBC 4.0 - 10.5 K/uL 7.1  6.7  7.3   Hemoglobin 12.0 - 15.0 g/dL 9.3  9.9  16.1   Hematocrit 36.0 - 46.0 % 28.6  31.1  32.5   Platelets 150 - 400 K/uL 143  282  174         Latest Ref Rng & Units 12/24/2023    9:33 AM 12/12/2023   10:05 AM 11/26/2023    9:33 AM  CMP  Glucose 70 - 99 mg/dL 096  045  409   BUN 8 - 23 mg/dL 7  7  7    Creatinine 0.44 - 1.00 mg/dL 8.11  9.14  7.82   Sodium 135 - 145 mmol/L 142  144  142   Potassium 3.5 - 5.1 mmol/L 3.4  3.5  4.2   Chloride 98 - 111 mmol/L 110  110  111   CO2 22 - 32 mmol/L 28  29  27    Calcium 8.9 - 10.3 mg/dL 9.7  95.6  21.3   Total Protein 6.5 - 8.1 g/dL 6.1  6.3  6.4   Total Bilirubin 0.0 - 1.2 mg/dL 0.2  0.3  0.2   Alkaline Phos 38 - 126 U/L 125  122  125   AST 15 - 41 U/L 13  14  13    ALT 0 - 44 U/L 10  12  10        RADIOGRAPHIC STUDIES: I have personally reviewed the radiological images as listed and agreed with the findings in the report. No results found.    No orders of the defined types were placed in this encounter.  All questions were answered. The patient knows to call the clinic with any problems, questions or  concerns. No barriers to learning was detected. The total time spent in the appointment was 25 minutes.     Malachy Mood, MD 12/24/2023

## 2023-12-24 NOTE — Patient Instructions (Signed)
 [facility] - A DEPT OF [facility]  Discharge Instructions: Thank you for choosing [facility] to provide your oncology and hematology care.   If you have a lab appointment with the Cancer Center, please go directly to the Cancer Center and check in at the registration area.   Wear comfortable clothing and clothing appropriate for easy access to any Portacath or PICC line.   We strive to give you quality time with your provider. You may need to reschedule your appointment if you arrive late (15 or more minutes).  Arriving late affects you and other patients whose appointments are after yours.  Also, if you miss three or more appointments without notifying the office, you may be dismissed from the clinic at the provider's discretion.      For prescription refill requests, have your pharmacy contact our office and allow 72 hours for refills to be completed.    Today you received the following chemotherapy and/or immunotherapy agents: Adriamycin, Cytoxan.       To help prevent nausea and vomiting after your treatment, we encourage you to take your nausea medication as directed.  BELOW ARE SYMPTOMS THAT SHOULD BE REPORTED IMMEDIATELY: *FEVER GREATER THAN 100.4 F (38 C) OR HIGHER *CHILLS OR SWEATING *NAUSEA AND VOMITING THAT IS NOT CONTROLLED WITH YOUR NAUSEA MEDICATION *UNUSUAL SHORTNESS OF BREATH *UNUSUAL BRUISING OR BLEEDING *URINARY PROBLEMS (pain or burning when urinating, or frequent urination) *BOWEL PROBLEMS (unusual diarrhea, constipation, pain near the anus) TENDERNESS IN MOUTH AND THROAT WITH OR WITHOUT PRESENCE OF ULCERS (sore throat, sores in mouth, or a toothache) UNUSUAL RASH, SWELLING OR PAIN  UNUSUAL VAGINAL DISCHARGE OR ITCHING   Items with * indicate a potential emergency and should be followed up as soon as possible or go to the Emergency Department if any problems should occur.  Please show the CHEMOTHERAPY ALERT CARD or  IMMUNOTHERAPY ALERT CARD at check-in to the Emergency Department and triage nurse.  Should you have questions after your visit or need to cancel or reschedule your appointment, please contact [facility] - A DEPT OF [facility]  Dept: [phone]  and follow the prompts.  Office hours are 8:00 a.m. to 4:30 p.m. Monday - Friday. Please note that voicemails left after 4:00 p.m. may not be returned until the following business day.  We are closed weekends and major holidays. You have access to a nurse at all times for urgent questions. Please call the main number to the clinic Dept: [phone] and follow the prompts.   For any non-urgent questions, you may also contact your provider using MyChart. We now offer e-Visits for anyone 60 and older to request care online for non-urgent symptoms. For details visit [email or web address].   Also download the MyChart app! Go to the app store, search "MyChart", open the app, select [place], and log in with your MyChart username and password.

## 2023-12-26 ENCOUNTER — Inpatient Hospital Stay: Payer: 59

## 2023-12-26 ENCOUNTER — Ambulatory Visit: Payer: 59

## 2023-12-26 ENCOUNTER — Ambulatory Visit: Payer: 59 | Admitting: Hematology

## 2023-12-26 ENCOUNTER — Other Ambulatory Visit: Payer: 59

## 2023-12-26 VITALS — BP 135/65 | HR 78 | Temp 98.5°F | Resp 18

## 2023-12-26 DIAGNOSIS — Z1732 Human epidermal growth factor receptor 2 negative status: Secondary | ICD-10-CM | POA: Diagnosis not present

## 2023-12-26 DIAGNOSIS — D6481 Anemia due to antineoplastic chemotherapy: Secondary | ICD-10-CM | POA: Diagnosis not present

## 2023-12-26 DIAGNOSIS — Z170 Estrogen receptor positive status [ER+]: Secondary | ICD-10-CM | POA: Diagnosis not present

## 2023-12-26 DIAGNOSIS — Z79899 Other long term (current) drug therapy: Secondary | ICD-10-CM | POA: Diagnosis not present

## 2023-12-26 DIAGNOSIS — Z5189 Encounter for other specified aftercare: Secondary | ICD-10-CM | POA: Diagnosis not present

## 2023-12-26 DIAGNOSIS — Z5111 Encounter for antineoplastic chemotherapy: Secondary | ICD-10-CM | POA: Diagnosis not present

## 2023-12-26 DIAGNOSIS — C50912 Malignant neoplasm of unspecified site of left female breast: Secondary | ICD-10-CM

## 2023-12-26 DIAGNOSIS — T451X5A Adverse effect of antineoplastic and immunosuppressive drugs, initial encounter: Secondary | ICD-10-CM | POA: Diagnosis not present

## 2023-12-26 DIAGNOSIS — Z7982 Long term (current) use of aspirin: Secondary | ICD-10-CM | POA: Diagnosis not present

## 2023-12-26 DIAGNOSIS — Z1721 Progesterone receptor positive status: Secondary | ICD-10-CM | POA: Diagnosis not present

## 2023-12-26 DIAGNOSIS — E876 Hypokalemia: Secondary | ICD-10-CM | POA: Diagnosis not present

## 2023-12-26 DIAGNOSIS — Z7951 Long term (current) use of inhaled steroids: Secondary | ICD-10-CM | POA: Diagnosis not present

## 2023-12-26 DIAGNOSIS — C50412 Malignant neoplasm of upper-outer quadrant of left female breast: Secondary | ICD-10-CM | POA: Diagnosis not present

## 2023-12-26 DIAGNOSIS — C773 Secondary and unspecified malignant neoplasm of axilla and upper limb lymph nodes: Secondary | ICD-10-CM | POA: Diagnosis not present

## 2023-12-26 MED ORDER — PEGFILGRASTIM-CBQV 6 MG/0.6ML ~~LOC~~ SOSY
6.0000 mg | PREFILLED_SYRINGE | Freq: Once | SUBCUTANEOUS | Status: AC
  Administered 2023-12-26: 6 mg via SUBCUTANEOUS
  Filled 2023-12-26: qty 0.6

## 2023-12-28 ENCOUNTER — Ambulatory Visit: Payer: 59

## 2023-12-28 ENCOUNTER — Encounter: Payer: Self-pay | Admitting: *Deleted

## 2024-01-01 ENCOUNTER — Other Ambulatory Visit: Payer: Self-pay

## 2024-01-03 ENCOUNTER — Encounter: Payer: Self-pay | Admitting: Student

## 2024-01-06 NOTE — Assessment & Plan Note (Addendum)
-  pT3N3aM0, stage IIIA, ER+/PR+/HER2- -diagnosed in 09/2023. Patient presented with a palpable left breast mass, ultrasound showed multiple enlarged left axillary lymph node. -Due to her underlying dementia, she underwent upfront surgery on 10/04/2023.  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 -I discussed the high risk of recurrence due to large number of positive lymph node, and the benefit of adjuvant chemotherapy, followed by adjuvant radiation, aromatase inhibitor and CDK4/6 inhibitor  -her staging CT and bone scan was negative for distant metastasis, except a 1cm left axillary node which is likely reactive to surgery, will f/u on CT in 6 months  -she started adjuvant chemo AC on 11/12/2023, and weekly Taxol on 01/07/24.

## 2024-01-07 ENCOUNTER — Encounter: Payer: Self-pay | Admitting: Hematology

## 2024-01-07 ENCOUNTER — Inpatient Hospital Stay: Payer: 59

## 2024-01-07 ENCOUNTER — Other Ambulatory Visit: Payer: Self-pay

## 2024-01-07 ENCOUNTER — Inpatient Hospital Stay (HOSPITAL_BASED_OUTPATIENT_CLINIC_OR_DEPARTMENT_OTHER): Payer: 59 | Admitting: Hematology

## 2024-01-07 VITALS — BP 132/73 | HR 75 | Temp 97.7°F | Resp 20 | Wt 183.8 lb

## 2024-01-07 VITALS — BP 146/88 | HR 72 | Temp 98.2°F | Resp 18

## 2024-01-07 DIAGNOSIS — Z95828 Presence of other vascular implants and grafts: Secondary | ICD-10-CM

## 2024-01-07 DIAGNOSIS — T451X5A Adverse effect of antineoplastic and immunosuppressive drugs, initial encounter: Secondary | ICD-10-CM | POA: Diagnosis not present

## 2024-01-07 DIAGNOSIS — C50912 Malignant neoplasm of unspecified site of left female breast: Secondary | ICD-10-CM

## 2024-01-07 DIAGNOSIS — Z79899 Other long term (current) drug therapy: Secondary | ICD-10-CM | POA: Diagnosis not present

## 2024-01-07 DIAGNOSIS — Z7951 Long term (current) use of inhaled steroids: Secondary | ICD-10-CM | POA: Diagnosis not present

## 2024-01-07 DIAGNOSIS — C773 Secondary and unspecified malignant neoplasm of axilla and upper limb lymph nodes: Secondary | ICD-10-CM | POA: Diagnosis not present

## 2024-01-07 DIAGNOSIS — D6481 Anemia due to antineoplastic chemotherapy: Secondary | ICD-10-CM | POA: Diagnosis not present

## 2024-01-07 DIAGNOSIS — C50412 Malignant neoplasm of upper-outer quadrant of left female breast: Secondary | ICD-10-CM

## 2024-01-07 DIAGNOSIS — Z5111 Encounter for antineoplastic chemotherapy: Secondary | ICD-10-CM | POA: Diagnosis not present

## 2024-01-07 DIAGNOSIS — Z1721 Progesterone receptor positive status: Secondary | ICD-10-CM | POA: Diagnosis not present

## 2024-01-07 DIAGNOSIS — Z170 Estrogen receptor positive status [ER+]: Secondary | ICD-10-CM

## 2024-01-07 DIAGNOSIS — Z5189 Encounter for other specified aftercare: Secondary | ICD-10-CM | POA: Diagnosis not present

## 2024-01-07 DIAGNOSIS — Z1732 Human epidermal growth factor receptor 2 negative status: Secondary | ICD-10-CM | POA: Diagnosis not present

## 2024-01-07 DIAGNOSIS — E876 Hypokalemia: Secondary | ICD-10-CM | POA: Diagnosis not present

## 2024-01-07 DIAGNOSIS — Z7982 Long term (current) use of aspirin: Secondary | ICD-10-CM | POA: Diagnosis not present

## 2024-01-07 LAB — CMP (CANCER CENTER ONLY)
ALT: 11 U/L (ref 0–44)
AST: 22 U/L (ref 15–41)
Albumin: 3.7 g/dL (ref 3.5–5.0)
Alkaline Phosphatase: 115 U/L (ref 38–126)
Anion gap: 6 (ref 5–15)
BUN: 6 mg/dL — ABNORMAL LOW (ref 8–23)
CO2: 28 mmol/L (ref 22–32)
Calcium: 9.9 mg/dL (ref 8.9–10.3)
Chloride: 110 mmol/L (ref 98–111)
Creatinine: 0.63 mg/dL (ref 0.44–1.00)
GFR, Estimated: >60 mL/min
Glucose, Bld: 85 mg/dL (ref 70–99)
Potassium: 3.2 mmol/L — ABNORMAL LOW (ref 3.5–5.1)
Sodium: 144 mmol/L (ref 135–145)
Total Bilirubin: 0.2 mg/dL (ref 0.0–1.2)
Total Protein: 6.2 g/dL — ABNORMAL LOW (ref 6.5–8.1)

## 2024-01-07 LAB — CBC WITH DIFFERENTIAL (CANCER CENTER ONLY)
Abs Immature Granulocytes: 0.89 K/uL — ABNORMAL HIGH (ref 0.00–0.07)
Basophils Absolute: 0.2 K/uL — ABNORMAL HIGH (ref 0.0–0.1)
Basophils Relative: 2 %
Eosinophils Absolute: 0.1 K/uL (ref 0.0–0.5)
Eosinophils Relative: 1 %
HCT: 25.9 % — ABNORMAL LOW (ref 36.0–46.0)
Hemoglobin: 8.4 g/dL — ABNORMAL LOW (ref 12.0–15.0)
Immature Granulocytes: 9 %
Lymphocytes Relative: 12 %
Lymphs Abs: 1.2 K/uL (ref 0.7–4.0)
MCH: 29.1 pg (ref 26.0–34.0)
MCHC: 32.4 g/dL (ref 30.0–36.0)
MCV: 89.6 fL (ref 80.0–100.0)
Monocytes Absolute: 1.2 K/uL — ABNORMAL HIGH (ref 0.1–1.0)
Monocytes Relative: 12 %
Neutro Abs: 6.4 K/uL (ref 1.7–7.7)
Neutrophils Relative %: 64 %
Platelet Count: 207 K/uL (ref 150–400)
RBC: 2.89 MIL/uL — ABNORMAL LOW (ref 3.87–5.11)
RDW: 18.1 % — ABNORMAL HIGH (ref 11.5–15.5)
WBC Count: 9.9 K/uL (ref 4.0–10.5)
nRBC: 0.4 % — ABNORMAL HIGH (ref 0.0–0.2)

## 2024-01-07 MED ORDER — SODIUM CHLORIDE 0.9 % IV SOLN: INTRAVENOUS | Status: DC

## 2024-01-07 MED ORDER — FAMOTIDINE IN NACL 20-0.9 MG/50ML-% IV SOLN
20.0000 mg | Freq: Once | INTRAVENOUS | Status: AC
  Administered 2024-01-07: 20 mg via INTRAVENOUS
  Filled 2024-01-07: qty 50

## 2024-01-07 MED ORDER — SODIUM CHLORIDE 0.9 % IV SOLN
80.0000 mg/m2 | Freq: Once | INTRAVENOUS | Status: AC
  Administered 2024-01-07: 156 mg via INTRAVENOUS
  Filled 2024-01-07: qty 26

## 2024-01-07 MED ORDER — DEXAMETHASONE SODIUM PHOSPHATE 10 MG/ML IJ SOLN
10.0000 mg | Freq: Once | INTRAMUSCULAR | Status: AC
  Administered 2024-01-07: 10 mg via INTRAVENOUS
  Filled 2024-01-07: qty 1

## 2024-01-07 MED ORDER — DIPHENHYDRAMINE HCL 50 MG/ML IJ SOLN
50.0000 mg | Freq: Once | INTRAMUSCULAR | Status: AC
  Administered 2024-01-07: 50 mg via INTRAVENOUS
  Filled 2024-01-07: qty 1

## 2024-01-07 MED ORDER — SODIUM CHLORIDE 0.9% FLUSH
10.0000 mL | Freq: Once | INTRAVENOUS | Status: AC
  Administered 2024-01-07: 10 mL

## 2024-01-07 NOTE — Progress Notes (Signed)
 [facility]   [phone] [phone]   Clinic Follow up Note   Patient Care Team: Erick Alley, DO as PCP - General (Family Medicine) Runell Gess, MD as PCP - Cardiology (Cardiology) York Spaniel, MD (Inactive) as Consulting Physician (Neurology) Malachy Mood, MD as Consulting Physician (Hematology and Oncology)  Date of Service:  01/07/2024  CHIEF COMPLAINT: f/u of breast cancer  CURRENT THERAPY:  Adjuvant chemotherapy with weekly Taxol  Oncology History   Malignant neoplasm of upper-outer quadrant of left breast in female, estrogen receptor positive (HCC) -pT3N3aM0, stage IIIA, ER+/PR+/HER2- -diagnosed in 09/2023. Patient presented with a palpable left breast mass, ultrasound showed multiple enlarged left axillary lymph node. -Due to her underlying dementia, she underwent upfront surgery on 10/04/2023.  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 -I discussed the high risk of recurrence due to large number of positive lymph node, and the benefit of adjuvant chemotherapy, followed by adjuvant radiation, aromatase inhibitor and CDK4/6 inhibitor  -her staging CT and bone scan was negative for distant metastasis, except a 1cm left axillary node which is likely reactive to surgery, will f/u on CT in 6 months  -she started adjuvant chemo AC on 11/12/2023, and weekly Taxol on 01/07/24.    Assessment and Plan    Breast cancer Undergoing adjuvant chemotherapy for breast cancer. Completed four treatments of Adriamycin and starting Taxol today. Reports feeling sluggish but managing daily activities well. Aware of potential side effects of Taxol, including neuropathy and cramps, and prepared to use ice packs during chemotherapy to mitigate neuropathy risk. Taxol is scheduled weekly for 12 weeks, with breaks possible if significant fatigue or low blood counts occur. - Administer Taxol weekly for 12 weeks - Instruct to  use ice packs on hands and feet during chemotherapy to reduce neuropathy risk - Advise to stay hydrated and consider over-the-counter magnesium or sports drinks to manage cramps - Schedule chemotherapy sessions for March 24, March 31, April 7, and April 14 - Plan follow-up every other week  Hypertension On metoprolol 50 mg twice daily. Medication regimen is well-managed.  Hyperlipidemia Taking lovastatin every Monday and Friday. Medication regimen is well-managed.  Asthma Uses albuterol and Flonase as needed, and Flovent seasonally. Medication regimen is well-managed.  Plan -Lab reviewed, adequate for treatment, will proceed first dose Taxol today and continue weekly -Follow-up in 2 weeks        SUMMARY OF ONCOLOGIC HISTORY: Oncology History  Infiltrating ductal carcinoma of breast (HCC)  09/22/2023 Initial Diagnosis   Infiltrating ductal carcinoma of breast (HCC)   11/12/2023 -  Chemotherapy   Patient is on Treatment Plan : BREAST DOSE DENSE AC q14d / PACLitaxel q7d     Malignant neoplasm of upper-outer quadrant of left breast in female, estrogen receptor positive (HCC)  09/28/2023 Initial Diagnosis   Malignant neoplasm of upper-outer quadrant of left breast in female, estrogen receptor positive (HCC)   09/28/2023 Cancer Staging   Staging form: Breast, AJCC 8th Edition - Clinical stage from 09/28/2023: Stage IIA (cT2, cN1, cM0, G2, ER+, PR+, HER2-) - Signed by Malachy Mood, MD on 09/28/2023 Histologic grading system: 3 grade system   10/04/2023 Cancer Staging   Staging form: Breast, AJCC 8th Edition - Pathologic stage from 10/04/2023: Stage IIIA (pT2, pN3a, cM0, G2, ER+, PR+, HER2-) - Signed by Malachy Mood, MD on 10/19/2023 Histologic grading system: 3 grade system Residual tumor (R): R0 - None   10/31/2023 Imaging   NM whole body  bone scan IMPRESSION: No evidence skeletal metastasis.   10/31/2023 Imaging   CT chest abdomen and pelvis with contrast  IMPRESSION: 1. Status  post lumpectomy of the upper outer left breast and left axillary lymph node dissection with associated simple attenuation fluid collections, most consistent with postoperative seromas. 2. Enlarged left axillary lymph node measuring 1.0 x 0.8 cm, nonspecific and possibly reactive in the setting of recent surgery. Correlate with tissue findings of lymph node dissection. Attention on follow-up. 3. No other evidence of lymphadenopathy or metastatic disease in the chest, abdomen, or pelvis. 4. Nonobstructive bilateral nephrolithiasis.   11/08/2023 Echocardiogram   Echocardiogram FINDINGS   Left Ventricle: Left ventricular ejection fraction, by estimation, is 55 to 60%. Left ventricular ejection fraction by 3D volume is 56 %. The left ventricle has normal function. The left ventricle has no regional wall motion abnormalities. Global longitudinal strain performed but not reported based on interpreter judgement due to suboptimal tracking. The left ventricular internal cavity size was mildly dilated. There is mild left ventricular hypertrophy. Left ventricular diastolic parameters are consistent with Grade I diastolic dysfunction (impaired relaxation). Indeterminate filling pressures.  Right Ventricle: The right ventricular size is normal. No increase in right ventricular wall thickness. Right ventricular systolic function is  low normal. There is normal pulmonary artery systolic pressure. The tricuspid regurgitant velocity is 2.12 m/s,  and with an assumed right atrial pressure of 3 mmHg, the estimated right ventricular systolic pressure is 21.0 mmHg.  Left Atrium: Left atrial size was normal in size.  Right Atrium: Right atrial size was normal in size.  Pericardium: There is no evidence of pericardial effusion.  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 No evidence of mitral valve stenosis.  Tricuspid Valve: The tricuspid valve is normal in structure. Tricuspid valve  regurgitation is mild . No evidence of tricuspid stenosis.  Aortic Valve: The aortic valve is tricuspid. Aortic valve regurgitation is not visualized. No aortic stenosis is present.  Pulmonic Valve: The pulmonic valve was normal in structure. Pulmonic valve regurgitation is mild. No evidence of pulmonic stenosis.  Aorta: The aortic root and ascending aorta are structurally normal, with no evidence of dilitation.  Venous: The inferior vena cava is normal in size with greater than 50% respiratory variability, suggesting right atrial pressure of 3 mmHg.  IAS/Shunts: There is redundancy of the interatrial septum. Cannot exclude a small PFO.        Discussed the use of AI scribe software for clinical note transcription with the patient, who gave verbal consent to proceed.  History of Present Illness   The patient, a 68 year old female with a history of breast cancer, presents for a follow-up visit after completing four treatments of atrial mice. She reports feeling "sluggish" but has otherwise recovered well from the treatments. Her weight has remained stable. The patient is aware of the potential side effects of Taxol, including neuropathy, numbness, and tingling in the fingers and toes, and has been advised to use ice packs during the chemo to reduce drug exposure in her hands and feet. She is also aware that the drug can cause stomach and muscle cramps and has been advised to drink enough water and take over-the-counter magnesium or minerals or sports drinks. Despite her ongoing treatment, the patient remains active, attending appointments and walking her dog. She reports no issues with memory loss.         All other systems were reviewed with the patient and are negative.  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   Hyperlipidemia    Hyperparathyroidism (HCC) 07/25/2019   Hypertension    Normal cardiac stress test    Myoview stress test   OAB (overactive bladder) 09/01/2015   Osteopenia 05/2018   T score -1.1 FRAX 2.5%/ 0.1%   Subclinical hyperthyroidism 05/08/2007   Qualifier: Diagnosis of  By: Jimmey Ralph MD, Victorino Dike      SURGICAL HISTORY: Past Surgical History:  Procedure Laterality Date   BREAST BIOPSY Left 09/19/2023   x's 2   BREAST BIOPSY Left 09/19/2023   [place] LT BREAST BX W LOC DEV 1ST LESION IMG BX SPEC US GUIDE 09/19/2023 [facility]   BREAST BIOPSY Left 10/03/2023   [place] LT RADIOACTIVE SEED LOC 10/03/2023 [facility]   BREAST BIOPSY Left 10/03/2023   [place] LT RADIOACTIVE SEED EA ADD LESION 10/03/2023 [facility]   BREAST LUMPECTOMY WITH RADIOACTIVE SEED AND AXILLARY LYMPH NODE DISSECTION Left 10/04/2023   Procedure: LEFT BREAST LUMPECTOMY AND TARGETED LYMPH NODE DISSECTION;  Surgeon: Abigail Miyamoto, MD;  Location: [facility];  Service: General;  Laterality: Left;   COLONOSCOPY     IR IMAGING GUIDED PORT INSERTION  11/05/2023   POLYPECTOMY     TOTAL HIP ARTHROPLASTY Left 10/01/2013   DR ROWAN   TOTAL HIP ARTHROPLASTY Left 10/01/2013   Procedure: TOTAL HIP ARTHROPLASTY;  Surgeon: [name], MD;  Location: [facility] OR;  Service: Orthopedics;  Laterality: Left;    I have reviewed the social history and family history with the patient and they are unchanged from previous note.  ALLERGIES:  is allergic to [facility] [aprepitant], hydrochlorothiazide, hydrocodone, and simvastatin.  MEDICATIONS:  Current Outpatient Medications  Medication Sig Dispense Refill   metoprolol tartrate (LOPRESSOR) 50 MG tablet TAKE 1 TABLET BY MOUTH TWICE  DAILY  200 tablet 2   albuterol (VENTOLIN HFA) 108 (90 Base) MCG/ACT inhaler USE 2 INHALATIONS BY MOUTH EVERY 6 HOURS AS NEEDED FOR WHEEZING  OR SHORTNESS OF BREATH (Patient taking differently: Inhale 2 puffs into the lungs every 6 (six) hours as needed for wheezing or shortness of breath.) 26.8 g 2   amLODipine (NORVASC) 10 MG tablet TAKE 1 TABLET BY MOUTH EVERY  MORNING 100 tablet 2   aspirin EC 81 MG tablet Take 81 mg by mouth daily. Swallow whole.     Budesonide 90 MCG/ACT inhaler Inhale 1 puff into the lungs 2 (two) times daily. 1 each 0   capsaicin (ZOSTRIX) 0.025 % cream Apply topically 2 (two) times daily. 60 g 0   cholecalciferol (VITAMIN D3) 25 MCG (1000 UNIT) tablet Take 1 tablet (1,000 Units total) by mouth daily. 7 tablet 0   Cholecalciferol (VITAMIN D3) 50 MCG (2000 UT) CHEW Chew 2,000 Units by mouth daily.     donepezil (ARICEPT) 10 MG tablet Take 10 mg by mouth at bedtime.     fluticasone (FLONASE) 50 MCG/ACT nasal spray USE 1 SPRAY IN BOTH NOSTRILS  DAILY (Patient  taking differently: Place 1 spray into both nostrils daily as needed for allergies or rhinitis.) 16 g 0   fluticasone (FLOVENT HFA) 110 MCG/ACT inhaler Inhale 1 puff into the lungs daily as needed. (Patient taking differently: Inhale 1 puff into the lungs daily as needed ("for flares").) 1 each 12   gabapentin (NEURONTIN) 100 MG capsule TAKE 1 CAPSULE BY MOUTH DAILY 90 capsule 3   lidocaine-prilocaine (EMLA) cream Apply to affected area once 30 g 3   loratadine (EQ ALLERGY RELIEF) 10 MG tablet Take 1 tablet (10 mg total) by mouth daily. (Patient taking differently: Take 10 mg by mouth daily as needed for allergies or rhinitis.) 90 tablet 3   losartan (COZAAR) 50 MG tablet Take 1 tablet (50 mg total) by mouth at bedtime. 90 tablet 3   ondansetron (ZOFRAN) 8 MG tablet Take 1 tab (8 mg) by mouth every 8 hrs as needed for nausea/vomiting. Start third day after doxorubicin/cyclophosphamide chemotherapy. 30 tablet 1   ondansetron  (ZOFRAN-ODT) 4 MG disintegrating tablet Take 4 mg by mouth every 8 (eight) hours as needed for nausea or vomiting (dissolve orally).     prochlorperazine (COMPAZINE) 10 MG tablet Take 1 tablet (10 mg total) by mouth every 6 (six) hours as needed for nausea or vomiting. 30 tablet 1   rosuvastatin (CRESTOR) 5 MG tablet Take one tablet by mouth every Monday and Friday. (Patient taking differently: Take 5 mg by mouth See admin instructions. Take 5 mg by mouth every Monday and Friday) 20 tablet 4   sertraline (ZOLOFT) 50 MG tablet Take 1 tablet (50 mg total) by mouth daily. 30 tablet 0   traMADol (ULTRAM) 50 MG tablet Take 1 tablet (50 mg total) by mouth every 6 (six) hours as needed for moderate pain (pain score 4-6) or severe pain (pain score 7-10). 25 tablet 0   TYLENOL 500 MG tablet Take 500-1,000 mg by mouth every 6 (six) hours as needed for mild pain (pain score 1-3) or headache.     No current facility-administered medications for this visit.   Facility-Administered Medications Ordered in Other Visits  Medication Dose Route Frequency Provider Last Rate Last Admin   0.9 %  sodium chloride infusion   Intravenous Continuous Malachy Mood, MD   Stopped at 01/07/24 1413    PHYSICAL EXAMINATION: ECOG PERFORMANCE STATUS: 1 - Symptomatic but completely ambulatory  Vitals:   01/07/24 1145  BP: 132/73  Pulse: 75  Resp: 20  Temp: 97.7 F (36.5 C)  SpO2: 100%   Wt Readings from Last 3 Encounters:  01/07/24 183 lb 12.8 oz (83.4 kg)  12/24/23 185 lb 12.8 oz (84.3 kg)  12/17/23 184 lb (83.5 kg)     GENERAL:alert, no distress and comfortable SKIN: skin color, texture, turgor are normal, no rashes or significant lesions EYES: normal, Conjunctiva are pink and non-injected, sclera clear Musculoskeletal:no cyanosis of digits and no clubbing  NEURO: alert & oriented x 3 with fluent speech, no focal motor/sensory deficits       LABORATORY DATA:  I have reviewed the data as listed    Latest Ref  Rng & Units 01/07/2024   11:25 AM 12/24/2023    9:33 AM 12/12/2023   10:05 AM  CBC  WBC 4.0 - 10.5 K/uL 9.9  7.1  6.7   Hemoglobin 12.0 - 15.0 g/dL 8.4  9.3  9.9   Hematocrit 36.0 - 46.0 % 25.9  28.6  31.1   Platelets 150 - 400 K/uL 207  143  282         Latest Ref Rng & Units 01/07/2024   11:25 AM 12/24/2023    9:33 AM 12/12/2023   10:05 AM  CMP  Glucose 70 - 99 mg/dL 85  409  811   BUN 8 - 23 mg/dL 6  7  7    Creatinine 0.44 - 1.00 mg/dL 9.14  7.82  9.56   Sodium 135 - 145 mmol/L 144  142  144   Potassium 3.5 - 5.1 mmol/L 3.2  3.4  3.5   Chloride 98 - 111 mmol/L 110  110  110   CO2 22 - 32 mmol/L 28  28  29    Calcium 8.9 - 10.3 mg/dL 9.9  9.7  21.3   Total Protein 6.5 - 8.1 g/dL 6.2  6.1  6.3   Total Bilirubin 0.0 - 1.2 mg/dL 0.2  0.2  0.3   Alkaline Phos 38 - 126 U/L 115  125  122   AST 15 - 41 U/L 22  13  14    ALT 0 - 44 U/L 11  10  12        RADIOGRAPHIC STUDIES: I have personally reviewed the radiological images as listed and agreed with the findings in the report. No results found.    Orders Placed This Encounter  Procedures   CBC with Differential (Cancer Center Only)    Standing Status:   Future    Expected Date:   02/04/2024    Expiration Date:   02/03/2025   CMP (Cancer Center only)    Standing Status:   Future    Expected Date:   02/04/2024    Expiration Date:   02/03/2025   CBC with Differential (Cancer Center Only)    Standing Status:   Future    Expected Date:   02/11/2024    Expiration Date:   02/10/2025   CMP (Cancer Center only)    Standing Status:   Future    Expected Date:   02/11/2024    Expiration Date:   02/10/2025   CBC with Differential (Cancer Center Only)    Standing Status:   Future    Expected Date:   02/18/2024    Expiration Date:   02/17/2025   CMP (Cancer Center only)    Standing Status:   Future    Expected Date:   02/18/2024    Expiration Date:   02/17/2025   CBC with Differential (Cancer Center Only)    Standing Status:   Future     Expected Date:   02/25/2024    Expiration Date:   02/24/2025   CMP (Cancer Center only)    Standing Status:   Future    Expected Date:   02/25/2024    Expiration Date:   02/24/2025   All questions were answered. The patient knows to call the clinic with any problems, questions or concerns. No barriers to learning was detected. The total time spent in the appointment was 25 minutes.     Malachy Mood, MD 01/07/2024

## 2024-01-07 NOTE — Patient Instructions (Signed)

## 2024-01-08 ENCOUNTER — Encounter: Payer: Self-pay | Admitting: Hematology

## 2024-01-08 NOTE — Telephone Encounter (Signed)
-----   Message from Nurse Barbara Cower E sent at 01/07/2024  3:50 PM EDT ----- Regarding: first time taxol- feng Patient received taxol today for the first time. She is followed by Dr. Mosetta Putt. She tolerated treatment well with no issues!

## 2024-01-08 NOTE — Telephone Encounter (Signed)
 Called & left message for pt to return call to Dr Adair Patter to let us know how she did with her recent treatment.

## 2024-01-14 ENCOUNTER — Inpatient Hospital Stay: Payer: 59 | Admitting: Nurse Practitioner

## 2024-01-14 ENCOUNTER — Inpatient Hospital Stay

## 2024-01-14 ENCOUNTER — Inpatient Hospital Stay: Payer: 59

## 2024-01-14 ENCOUNTER — Other Ambulatory Visit: Payer: Self-pay

## 2024-01-14 ENCOUNTER — Other Ambulatory Visit: Payer: Self-pay | Admitting: Nurse Practitioner

## 2024-01-14 DIAGNOSIS — Z1732 Human epidermal growth factor receptor 2 negative status: Secondary | ICD-10-CM | POA: Diagnosis not present

## 2024-01-14 DIAGNOSIS — T451X5A Adverse effect of antineoplastic and immunosuppressive drugs, initial encounter: Secondary | ICD-10-CM | POA: Diagnosis not present

## 2024-01-14 DIAGNOSIS — C50912 Malignant neoplasm of unspecified site of left female breast: Secondary | ICD-10-CM

## 2024-01-14 DIAGNOSIS — Z5111 Encounter for antineoplastic chemotherapy: Secondary | ICD-10-CM | POA: Diagnosis not present

## 2024-01-14 DIAGNOSIS — Z7951 Long term (current) use of inhaled steroids: Secondary | ICD-10-CM | POA: Diagnosis not present

## 2024-01-14 DIAGNOSIS — Z170 Estrogen receptor positive status [ER+]: Secondary | ICD-10-CM | POA: Diagnosis not present

## 2024-01-14 DIAGNOSIS — Z1721 Progesterone receptor positive status: Secondary | ICD-10-CM | POA: Diagnosis not present

## 2024-01-14 DIAGNOSIS — Z5189 Encounter for other specified aftercare: Secondary | ICD-10-CM | POA: Diagnosis not present

## 2024-01-14 DIAGNOSIS — Z7982 Long term (current) use of aspirin: Secondary | ICD-10-CM | POA: Diagnosis not present

## 2024-01-14 DIAGNOSIS — C50412 Malignant neoplasm of upper-outer quadrant of left female breast: Secondary | ICD-10-CM | POA: Diagnosis not present

## 2024-01-14 DIAGNOSIS — Z79899 Other long term (current) drug therapy: Secondary | ICD-10-CM | POA: Diagnosis not present

## 2024-01-14 DIAGNOSIS — D6481 Anemia due to antineoplastic chemotherapy: Secondary | ICD-10-CM | POA: Diagnosis not present

## 2024-01-14 DIAGNOSIS — C773 Secondary and unspecified malignant neoplasm of axilla and upper limb lymph nodes: Secondary | ICD-10-CM | POA: Diagnosis not present

## 2024-01-14 DIAGNOSIS — Z95828 Presence of other vascular implants and grafts: Secondary | ICD-10-CM

## 2024-01-14 DIAGNOSIS — E876 Hypokalemia: Secondary | ICD-10-CM | POA: Diagnosis not present

## 2024-01-14 DIAGNOSIS — D649 Anemia, unspecified: Secondary | ICD-10-CM

## 2024-01-14 LAB — CBC WITH DIFFERENTIAL (CANCER CENTER ONLY)
Abs Immature Granulocytes: 0.04 10*3/uL (ref 0.00–0.07)
Basophils Absolute: 0.1 10*3/uL (ref 0.0–0.1)
Basophils Relative: 1 %
Eosinophils Absolute: 0.1 10*3/uL (ref 0.0–0.5)
Eosinophils Relative: 1 %
HCT: 24.1 % — ABNORMAL LOW (ref 36.0–46.0)
Hemoglobin: 7.7 g/dL — ABNORMAL LOW (ref 12.0–15.0)
Immature Granulocytes: 1 %
Lymphocytes Relative: 20 %
Lymphs Abs: 0.9 10*3/uL (ref 0.7–4.0)
MCH: 28.9 pg (ref 26.0–34.0)
MCHC: 32 g/dL (ref 30.0–36.0)
MCV: 90.6 fL (ref 80.0–100.0)
Monocytes Absolute: 0.5 10*3/uL (ref 0.1–1.0)
Monocytes Relative: 10 %
Neutro Abs: 3 10*3/uL (ref 1.7–7.7)
Neutrophils Relative %: 67 %
Platelet Count: 280 10*3/uL (ref 150–400)
RBC: 2.66 MIL/uL — ABNORMAL LOW (ref 3.87–5.11)
RDW: 18.4 % — ABNORMAL HIGH (ref 11.5–15.5)
WBC Count: 4.5 10*3/uL (ref 4.0–10.5)
nRBC: 0 % (ref 0.0–0.2)

## 2024-01-14 LAB — CMP (CANCER CENTER ONLY)
ALT: 18 U/L (ref 0–44)
AST: 19 U/L (ref 15–41)
Albumin: 3.6 g/dL (ref 3.5–5.0)
Alkaline Phosphatase: 84 U/L (ref 38–126)
Anion gap: 5 (ref 5–15)
BUN: 6 mg/dL — ABNORMAL LOW (ref 8–23)
CO2: 27 mmol/L (ref 22–32)
Calcium: 10 mg/dL (ref 8.9–10.3)
Chloride: 111 mmol/L (ref 98–111)
Creatinine: 0.78 mg/dL (ref 0.44–1.00)
GFR, Estimated: >60 mL/min (ref >60)
Glucose, Bld: 123 mg/dL — ABNORMAL HIGH (ref 70–99)
Potassium: 3.5 mmol/L (ref 3.5–5.1)
Sodium: 143 mmol/L (ref 135–145)
Total Bilirubin: 0.3 mg/dL (ref 0.0–1.2)
Total Protein: 6 g/dL — ABNORMAL LOW (ref 6.5–8.1)

## 2024-01-14 LAB — PREPARE RBC (CROSSMATCH)

## 2024-01-14 MED ORDER — SODIUM CHLORIDE 0.9% FLUSH
10.0000 mL | Freq: Once | INTRAVENOUS | Status: AC
  Administered 2024-01-14: 10 mL

## 2024-01-14 NOTE — Patient Instructions (Signed)
 Blood Transfusion, Adult A blood transfusion is a procedure in which you receive blood or a type of blood cell (blood component) through an IV. You may need a blood transfusion when you have a low blood count, which is a low number of any blood cell. This may result from a bleeding disorder, illness, injury, or surgery. The blood may come from a donor, or you may be able to have your own blood collected and stored (autologous blood donation) before a planned surgery. The blood given in a transfusion may be made up of different blood components. You may receive: Red blood cells. These carry oxygen to the cells in the body. Platelets. These help your blood to clot. Plasma. This is the liquid part of your blood. It carries proteins and other substances throughout the body. White blood cells. These help you fight infections. If you have hemophilia or another clotting disorder, you may also receive other types of blood products. Depending on the type of blood product, this procedure may take 1-4 hours to complete. Tell a health care provider about: Any bleeding problems you have. Any previous reactions you have had during a blood transfusion. Any allergies you have. All medicines you are taking, including vitamins, herbs, eye drops, creams, and over-the-counter medicines. Any surgeries you have had. Any medical conditions you have. Whether you are pregnant or may be pregnant. What are the risks? Talk with your health care provider about risks. The most common problems include: A mild allergic reaction, such as red, swollen areas of skin (hives) and itching. Fever or chills. This may be the body's response to new blood cells received. This may occur during or up to 4 hours after the transfusion. More serious problems may include: A serious allergic reaction that causes difficulty breathing or swelling around the face and lips. Transfusion-associated circulatory overload (TACO), or too much fluid in  the lungs. This may cause breathing problems. Transfusion-related acute lung injury (TRALI), which causes breathing difficulty and low oxygen in the blood. This can occur within hours of the transfusion or several days later. Iron overload. This can happen after receiving many blood transfusions over a period of time. Infection or virus being transmitted. This is rare because donated blood is carefully tested before it is given. Hemolytic transfusion reaction. This is rare. It happens when the body's defense system (immune system)tries to attack the new blood cells. Symptoms may include fever, chills, nausea, low blood pressure, and low back or chest pain. Transfusion-associated graft-versus-host disease (TAGVHD). This is rare. It happens when donated cells attack the body's healthy tissues. What happens before the procedure? You will have a blood test to check your blood type. This test is done to know what kind of blood your body will accept and to match it to the donor blood. If you are going to have a planned surgery, you may be able to do an autologous blood donation. This may be done in case you need to have a transfusion. You will have your temperature, blood pressure, and pulse checked before the transfusion. If you have had an allergic reaction to a transfusion in the past, you may be given medicine to help prevent a reaction. This medicine may be given to you by mouth (orally) or through an IV. What happens during the procedure?  An IV will be inserted into one of your veins. The bag of blood will be attached to your IV. The blood will then enter through your vein. Your temperature, blood pressure,  and pulse will be monitored during the transfusion. This monitoring is done to detect early signs of a transfusion reaction. Tell your nurse right away if you have any of these symptoms during the transfusion: Shortness of breath or trouble breathing. Chest or back pain. Fever or  chills. Itching or hives. If you have any signs or symptoms of a reaction, your transfusion will be stopped and you may be given medicine. When the transfusion is complete, your IV will be removed. Pressure may be applied to the IV site for a few minutes. A bandage (dressing)will be applied. The procedure may vary among health care providers and hospitals. What happens after the procedure? Your temperature, blood pressure, pulse, breathing rate, and blood oxygen level will be monitored until you leave the hospital or clinic. Your blood may be tested to see how you have responded to the transfusion. You may be warmed with fluids or blankets to maintain a normal body temperature. If you receive your blood transfusion in an outpatient setting, you will be told whom to contact to report any reactions. Where to find more information Visit the [employer]: redcross.org Summary A blood transfusion is a procedure in which you receive blood or a type of blood cell (blood component) through an IV. The blood given in a transfusion may be made up of different blood components. You may receive red blood cells, platelets, plasma, or white blood cells depending on the condition treated. Your temperature, blood pressure, and pulse will be monitored before, during, and after the transfusion. After the transfusion, your blood may be tested to see how your body has responded. This information is not intended to replace advice given to you by your health care provider. Make sure you discuss any questions you have with your health care provider. Document Revised: 12/30/2021 Document Reviewed: 12/30/2021 Elsevier Patient Education  2024 [employer].

## 2024-01-14 NOTE — Progress Notes (Signed)
 Cycle 6 day 1 deferred for 1 week due to Hgb 7.7 today. Will receive 1 unit pRBCs tomorrow. Follow up with Dr. Mosetta Putt 01/21/2024. Patient agreeable to the plan.  Beverly Gros, NP

## 2024-01-15 ENCOUNTER — Inpatient Hospital Stay: Attending: Hematology

## 2024-01-15 ENCOUNTER — Ambulatory Visit: Admitting: Student

## 2024-01-15 ENCOUNTER — Other Ambulatory Visit: Payer: Self-pay

## 2024-01-15 DIAGNOSIS — Z5111 Encounter for antineoplastic chemotherapy: Secondary | ICD-10-CM | POA: Insufficient documentation

## 2024-01-15 DIAGNOSIS — C50412 Malignant neoplasm of upper-outer quadrant of left female breast: Secondary | ICD-10-CM | POA: Insufficient documentation

## 2024-01-15 DIAGNOSIS — Z1732 Human epidermal growth factor receptor 2 negative status: Secondary | ICD-10-CM | POA: Diagnosis not present

## 2024-01-15 DIAGNOSIS — Z1721 Progesterone receptor positive status: Secondary | ICD-10-CM | POA: Diagnosis not present

## 2024-01-15 DIAGNOSIS — D649 Anemia, unspecified: Secondary | ICD-10-CM | POA: Insufficient documentation

## 2024-01-15 DIAGNOSIS — Z170 Estrogen receptor positive status [ER+]: Secondary | ICD-10-CM | POA: Insufficient documentation

## 2024-01-15 DIAGNOSIS — C50912 Malignant neoplasm of unspecified site of left female breast: Secondary | ICD-10-CM

## 2024-01-15 DIAGNOSIS — C773 Secondary and unspecified malignant neoplasm of axilla and upper limb lymph nodes: Secondary | ICD-10-CM | POA: Insufficient documentation

## 2024-01-15 MED ORDER — SODIUM CHLORIDE 0.9% FLUSH
3.0000 mL | INTRAVENOUS | Status: AC | PRN
  Administered 2024-01-15: 3 mL

## 2024-01-15 MED ORDER — HEPARIN SOD (PORK) LOCK FLUSH 100 UNIT/ML IV SOLN
250.0000 [IU] | INTRAVENOUS | Status: AC | PRN
  Administered 2024-01-15: 250 [IU]

## 2024-01-15 MED ORDER — SODIUM CHLORIDE 0.9% IV SOLUTION
250.0000 mL | INTRAVENOUS | Status: DC
  Administered 2024-01-15: 100 mL via INTRAVENOUS

## 2024-01-15 NOTE — Patient Instructions (Signed)

## 2024-01-16 LAB — TYPE AND SCREEN
ABO/RH(D): O POS
Antibody Screen: NEGATIVE
Unit division: 0

## 2024-01-16 LAB — BPAM RBC
Blood Product Expiration Date: 202504292359
ISSUE DATE / TIME: 202504011321
Unit Type and Rh: 202504292359
Unit Type and Rh: 5100

## 2024-01-17 ENCOUNTER — Ambulatory Visit
Admission: RE | Admit: 2024-01-17 | Discharge: 2024-01-17 | Disposition: A | Discharge status: Home / Self Care | Source: Ambulatory Visit | Attending: Family Medicine | Admitting: Family Medicine

## 2024-01-17 ENCOUNTER — Encounter: Payer: Self-pay | Admitting: Student

## 2024-01-17 ENCOUNTER — Ambulatory Visit

## 2024-01-17 VITALS — BP 126/80 | HR 80 | Temp 98.7°F | Ht 62.0 in | Wt 182.6 lb

## 2024-01-17 DIAGNOSIS — R079 Chest pain, unspecified: Secondary | ICD-10-CM

## 2024-01-17 DIAGNOSIS — M8580 Other specified disorders of bone density and structure, unspecified site: Secondary | ICD-10-CM

## 2024-01-17 DIAGNOSIS — J9811 Atelectasis: Secondary | ICD-10-CM | POA: Diagnosis not present

## 2024-01-17 DIAGNOSIS — M25512 Pain in left shoulder: Secondary | ICD-10-CM | POA: Diagnosis not present

## 2024-01-17 DIAGNOSIS — W19XXXA Unspecified fall, initial encounter: Secondary | ICD-10-CM | POA: Insufficient documentation

## 2024-01-17 DIAGNOSIS — R011 Cardiac murmur, unspecified: Secondary | ICD-10-CM | POA: Diagnosis not present

## 2024-01-17 NOTE — Assessment & Plan Note (Addendum)
 Pain likely musculoskeletal due to reproducibility with palpation and absence of other symptoms concerning for MI. Considered fracture due to osteopenia and PE due to cancer history and chest pain with inspiration. No tachycardia or hypoxia to suggest PE and pain with inspiration is likely musculoskeletal in nature.  - Order chest and shoulder x-rays to rule out fractures. - Advise Tylenol 1000 mg every six hours as needed for pain. - Recommend lidocaine patches or roll-on for pain relief. - Instruct to rest and allow time for healing. - Advise to go to the emergency department if shortness of breath, lightheadedness, or nausea develop.

## 2024-01-17 NOTE — Patient Instructions (Signed)
 It was great to see you! Thank you for allowing me to participate in your care!  I recommend that you always bring your medications to each appointment as this makes it easy to ensure you are on the correct medications and helps [place] not miss when refills are needed.  Our plans for today:  - go get x rays at [facility] at [address] [employer] - Can take tylenol 1000 mg every 6 hours as needed and use lidocaine patches or roll on for pain - If you develop other symptoms including shortness of breath, light headedness, nausea, go to the ED   Take care and seek immediate care sooner if you develop any concerns.   Dr. Erick Alley, DO [facility]

## 2024-01-17 NOTE — Assessment & Plan Note (Signed)
 VERY soft systolic murmur heard and on chart review has been heard in the past. 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 No concerning symptoms at this time. No further work up needed.

## 2024-01-17 NOTE — Progress Notes (Signed)
    SUBJECTIVE:   CHIEF COMPLAINT / HPI:   Patient is accompanied by her cousin  The patient presents with chest and shoulder pain following a fall from bed two nights prior. The patient reports falling asleep and rolling out of bed onto a hardwood floor. The patient denies hitting their head and reports no pain elsewhere. The pain, described as an ache, started immediately after the fall and has been constant since. The patient describes the pain as similar to bruising and reports tenderness upon palpation. The pain is located on the front of the body, primarily on the left chest and shoulder.  Deep inhalation does make the pain worse.  The patient denies nausea, vomiting, SOB and lightheadedness.   PERTINENT  PMH / PSH: Breast cancer undergoing chemotherapy, dementia, osteopenia  OBJECTIVE:   BP 126/80   Pulse 80   Temp 98.7 F (37.1 C)   Ht 5\' 2"  (1.575 m)   Wt 182 lb 9.6 oz (82.8 kg)   SpO2 99%   BMI 33.40 kg/m    General: NAD, pleasant, able to participate in exam Cardiac: RRR, very soft systolic murmur Respiratory: CTAB, normal effort, No wheezes, rales or rhonchi.  Reproducible chest pain with deep inhalation MSK: Reproducible pain with palpation of anterior left-sided chest wall and anterior and lateral left shoulder with some purple bruising noted on the lateral left shoulder Skin: warm and dry Neuro: alert, no obvious focal deficits Psych: Normal affect and mood  ASSESSMENT/PLAN:   Chest pain Pain likely musculoskeletal due to reproducibility with palpation and absence of other symptoms concerning for MI. Considered fracture due to osteopenia and PE due to cancer history and chest pain with inspiration. No tachycardia or hypoxia to suggest PE and pain with inspiration is likely musculoskeletal in nature.  - Order chest and shoulder x-rays to rule out fractures. - Advise Tylenol 1000 mg every six hours as needed for pain. - Recommend lidocaine patches or roll-on for  pain relief. - Instruct to rest and allow time for healing. - Advise to go to the emergency department if shortness of breath, lightheadedness, or nausea develop.  Genia Hotter out of bed while sleeping. Was in a twin size bed but now moved to full size.  Discussed purchasing bed railings to prevent future falls.  I will also contact her daughters about this given patient has dementia.   Murmur, heart VERY soft systolic murmur heard and on chart review has been heard in the past. 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 No concerning symptoms at this time. No further work up needed.      Dr. Erick Alley, DO [facility]

## 2024-01-17 NOTE — Assessment & Plan Note (Addendum)
 Larey Seat out of bed while sleeping. Was in a twin size bed but now moved to full size.  Discussed purchasing bed railings to prevent future falls.  I will also contact her daughters about this given patient has dementia.

## 2024-01-18 ENCOUNTER — Encounter: Payer: Self-pay | Admitting: Student

## 2024-01-21 ENCOUNTER — Inpatient Hospital Stay: Payer: 59

## 2024-01-21 ENCOUNTER — Inpatient Hospital Stay

## 2024-01-21 ENCOUNTER — Inpatient Hospital Stay: Payer: 59 | Admitting: Hematology

## 2024-01-21 VITALS — BP 145/76 | HR 77 | Temp 98.5°F | Resp 18

## 2024-01-21 DIAGNOSIS — C773 Secondary and unspecified malignant neoplasm of axilla and upper limb lymph nodes: Secondary | ICD-10-CM | POA: Diagnosis not present

## 2024-01-21 DIAGNOSIS — Z1721 Progesterone receptor positive status: Secondary | ICD-10-CM | POA: Diagnosis not present

## 2024-01-21 DIAGNOSIS — Z170 Estrogen receptor positive status [ER+]: Secondary | ICD-10-CM

## 2024-01-21 DIAGNOSIS — C50412 Malignant neoplasm of upper-outer quadrant of left female breast: Secondary | ICD-10-CM | POA: Diagnosis not present

## 2024-01-21 DIAGNOSIS — C50912 Malignant neoplasm of unspecified site of left female breast: Secondary | ICD-10-CM

## 2024-01-21 DIAGNOSIS — Z95828 Presence of other vascular implants and grafts: Secondary | ICD-10-CM

## 2024-01-21 DIAGNOSIS — D649 Anemia, unspecified: Secondary | ICD-10-CM | POA: Diagnosis not present

## 2024-01-21 DIAGNOSIS — Z5111 Encounter for antineoplastic chemotherapy: Secondary | ICD-10-CM | POA: Diagnosis not present

## 2024-01-21 DIAGNOSIS — Z1732 Human epidermal growth factor receptor 2 negative status: Secondary | ICD-10-CM | POA: Diagnosis not present

## 2024-01-21 LAB — CBC WITH DIFFERENTIAL (CANCER CENTER ONLY)
Abs Immature Granulocytes: 0.01 10*3/uL (ref 0.00–0.07)
Basophils Absolute: 0.1 10*3/uL (ref 0.0–0.1)
Basophils Relative: 1 %
Eosinophils Absolute: 0.2 10*3/uL (ref 0.0–0.5)
Eosinophils Relative: 4 %
HCT: 30.5 % — ABNORMAL LOW (ref 36.0–46.0)
Hemoglobin: 10 g/dL — ABNORMAL LOW (ref 12.0–15.0)
Immature Granulocytes: 0 %
Lymphocytes Relative: 21 %
Lymphs Abs: 1.1 10*3/uL (ref 0.7–4.0)
MCH: 29.1 pg (ref 26.0–34.0)
MCHC: 32.8 g/dL (ref 30.0–36.0)
MCV: 88.7 fL (ref 80.0–100.0)
Monocytes Absolute: 0.7 10*3/uL (ref 0.1–1.0)
Monocytes Relative: 14 %
Neutro Abs: 3.2 10*3/uL (ref 1.7–7.7)
Neutrophils Relative %: 60 %
Platelet Count: 278 10*3/uL (ref 150–400)
RBC: 3.44 MIL/uL — ABNORMAL LOW (ref 3.87–5.11)
RDW: 17.2 % — ABNORMAL HIGH (ref 11.5–15.5)
WBC Count: 5.4 10*3/uL (ref 4.0–10.5)
nRBC: 0 % (ref 0.0–0.2)

## 2024-01-21 LAB — CMP (CANCER CENTER ONLY)
ALT: 29 U/L (ref 0–44)
AST: 31 U/L (ref 15–41)
Albumin: 3.9 g/dL (ref 3.5–5.0)
Alkaline Phosphatase: 102 U/L (ref 38–126)
Anion gap: 5 (ref 5–15)
BUN: 6 mg/dL — ABNORMAL LOW (ref 8–23)
CO2: 27 mmol/L (ref 22–32)
Calcium: 10.3 mg/dL (ref 8.9–10.3)
Chloride: 110 mmol/L (ref 98–111)
Creatinine: 0.62 mg/dL (ref 0.44–1.00)
GFR, Estimated: >60 mL/min (ref >60)
Glucose, Bld: 106 mg/dL — ABNORMAL HIGH (ref 70–99)
Potassium: 3.4 mmol/L — ABNORMAL LOW (ref 3.5–5.1)
Sodium: 142 mmol/L (ref 135–145)
Total Bilirubin: 0.4 mg/dL (ref 0.0–1.2)
Total Protein: 6.8 g/dL (ref 6.5–8.1)

## 2024-01-21 MED ORDER — FAMOTIDINE IN NACL 20-0.9 MG/50ML-% IV SOLN
20.0000 mg | Freq: Once | INTRAVENOUS | Status: AC
  Administered 2024-01-21: 20 mg via INTRAVENOUS
  Filled 2024-01-21: qty 50

## 2024-01-21 MED ORDER — DIPHENHYDRAMINE HCL 50 MG/ML IJ SOLN
50.0000 mg | Freq: Once | INTRAMUSCULAR | Status: AC
  Administered 2024-01-21: 50 mg via INTRAVENOUS
  Filled 2024-01-21: qty 1

## 2024-01-21 MED ORDER — HEPARIN SOD (PORK) LOCK FLUSH 100 UNIT/ML IV SOLN: 500.0000 [IU] | Freq: Once | INTRAVENOUS | Status: DC | PRN

## 2024-01-21 MED ORDER — DEXAMETHASONE SODIUM PHOSPHATE 10 MG/ML IJ SOLN
10.0000 mg | Freq: Once | INTRAMUSCULAR | Status: AC
Start: 2024-01-21 — End: 2024-01-21
  Administered 2024-01-21: 10 mg via INTRAVENOUS
  Filled 2024-01-21: qty 1

## 2024-01-21 MED ORDER — SODIUM CHLORIDE 0.9% FLUSH
10.0000 mL | Freq: Once | INTRAVENOUS | Status: AC
  Administered 2024-01-21: 10 mL

## 2024-01-21 MED ORDER — SODIUM CHLORIDE 0.9% FLUSH: 10.0000 mL | INTRAVENOUS | Status: DC | PRN

## 2024-01-21 MED ORDER — SODIUM CHLORIDE 0.9 % IV SOLN: INTRAVENOUS | Status: DC

## 2024-01-21 MED ORDER — SODIUM CHLORIDE 0.9 % IV SOLN
80.0000 mg/m2 | Freq: Once | INTRAVENOUS | Status: AC
  Administered 2024-01-21: 156 mg via INTRAVENOUS
  Filled 2024-01-21: qty 26

## 2024-01-21 NOTE — Progress Notes (Signed)
 C17 added as C6 completed but not given per Dr. Mosetta Putt.  Drusilla Kanner, PharmD, MBA

## 2024-01-21 NOTE — Patient Instructions (Signed)

## 2024-01-22 ENCOUNTER — Encounter: Payer: Self-pay | Admitting: Nurse Practitioner

## 2024-01-24 ENCOUNTER — Encounter: Payer: Self-pay | Admitting: Student

## 2024-01-24 ENCOUNTER — Ambulatory Visit: Admitting: Student

## 2024-01-24 VITALS — BP 126/81 | HR 75 | Ht 62.0 in | Wt 180.6 lb

## 2024-01-24 DIAGNOSIS — F03911 Unspecified dementia, unspecified severity, with agitation: Secondary | ICD-10-CM | POA: Diagnosis not present

## 2024-01-24 DIAGNOSIS — R4589 Other symptoms and signs involving emotional state: Secondary | ICD-10-CM | POA: Diagnosis not present

## 2024-01-24 NOTE — Assessment & Plan Note (Addendum)
 Ongoing memory issues predating breast cancer diagnosis. Previously evaluated by neurologist and is being treated with Aricept.  She seems well aware of her aggressive outburst toward her family which were self admitted and aligned with what her daughter described.  No infectious cause suspected.  Vitals are stable, recent blood work and physical exam non concerning.  -Recommend family read up on Alzheimer's [employer] website about behavioral disturbances and what they can do at home to support her -No need for medication therapy for agitation at this time

## 2024-01-24 NOTE — Progress Notes (Signed)
 SUBJECTIVE:   CHIEF COMPLAINT / HPI:   The patient, with a history of breast cancer and memory issues, presents with significant life stressors and changes.  Patient presents for her appointment alone today.   She reports the death of her husband within the past year and a subsequent move out of her home. She is currently living with her sister at times in [place] and her daughter at times [place], which she finds frustrating due to a perceived loss of independence. She reports feeling "closed up" and not like herself anymore. She also mentions memory issues, which were present before her breast cancer diagnosis and have continued. She has been evaluated by neurology and has a diagnosis of dementia.  She denies any physical pain or discomfort at this time. The patient is currently on Zoloft for depression and anxiety, but expresses dissatisfaction with her current situation and mood.  She has been seen by therapist through hospice from when her husband passed away which she does not feel is particularly helpful.  Her daughter recently sent me a MyChart message expressing concern that her mother was was having more frequent agitated episodes of aggression, verbal abuse and physical threats but but they have low concern that she will harm herself or others.  Her daughter also notes that when she gets upset she will walk away from wherever she is but they are able to track her with a life 360 app on her phone.  Her daughter requests that she is worked up to make sure there is not an infectious cause for her worsening symptoms.  When asked how things were going at home she shakes her head and says she is having a difficult time as mentioned above due to loss of independence.  States that she gets very agitated with her daughters and will sometimes lose her temper and walk away.  States she goes for walks along the road where there is a dead end so she is not worried about getting lost.  She finds  enjoyment in reading.  States she used to do water aerobics at [employer] but has not done it in a long time.  Of note, patient was seen 01/17/2024 after a fall out of her bed and had chest and shoulder pain.  Today patient states his pain has significantly improved.  PERTINENT  PMH / PSH: dementia, breast cancer s/p lumpectomy, and undergoing chemotherapy   OBJECTIVE:   BP 126/81   Pulse 75   Ht 5\' 2"  (1.575 m)   Wt 180 lb 9.6 oz (81.9 kg)   SpO2 100%   BMI 33.03 kg/m    General: NAD, pleasant, well-appearing HEENT: White sclera, clear conjunctiva, MMM, no erythema or exudate of oropharynx, no cervical, submental, or clavicular lymphadenopathy Cardiac: RRR, soft systolic murmur, heard previously Respiratory: CTAB, normal effort, No wheezes, rales or rhonchi Abdomen: Bowel sounds present, nontender, nondistended, soft Extremities: no edema BLEs Skin: warm and dry Neuro: alert, no obvious focal deficits Psych: Normal affect and depressed mood  ASSESSMENT/PLAN:   Depressed mood Significant stress and mood disturbances due to life changes. Zoloft was increased a few months ago. No self-harm thoughts. Current therapy's effectiveness uncertain. Emphasized coping strategies and fulfilling activities.  Shared decision making used, considered switching from Zoloft to Lexapro but patient declined at this time. - Continue Zoloft 50 mg daily -can consider increasing or switching to different SSRI in future - Encourage continuation of therapy - Discuss with daughters about trying a different therapist  if needed. - Encourage reading, walking, and water aerobics for stress relief - Discuss with daughters scheduling independent activities.  Dementia [facility]) Ongoing memory issues predating breast cancer diagnosis. Previously evaluated by neurologist and is being treated with Aricept.  She seems well aware of her aggressive outburst toward her family which were self admitted and aligned with what her  daughter described.  No infectious cause suspected.  Vitals are stable, recent blood work and physical exam non concerning.  -Recommend family read up on Alzheimer's [employer] website about behavioral disturbances and what they can do at home to support her -No need for medication therapy for agitation at this time     Dr. Erick Alley, DO [facility]

## 2024-01-24 NOTE — Assessment & Plan Note (Signed)
 Significant stress and mood disturbances due to life changes. Zoloft was increased a few months ago. No self-harm thoughts. Current therapy's effectiveness uncertain. Emphasized coping strategies and fulfilling activities.  Shared decision making used, considered switching from Zoloft to Lexapro but patient declined at this time. - Continue Zoloft 50 mg daily -can consider increasing or switching to different SSRI in future - Encourage continuation of therapy - Discuss with daughters about trying a different therapist if needed. - Encourage reading, walking, and water aerobics for stress relief - Discuss with daughters scheduling independent activities.

## 2024-01-26 NOTE — Assessment & Plan Note (Signed)
-  [id number], stage IIIA, ER+/PR+/HER2- -diagnosed in 09/2023. Patient presented with a palpable left breast mass, ultrasound showed multiple enlarged left axillary lymph node. -Due to her underlying dementia, she underwent upfront surgery on 10/04/2023.  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 -I discussed the high risk of recurrence due to large number of positive lymph node, and the benefit of adjuvant chemotherapy, followed by adjuvant radiation, aromatase inhibitor and CDK4/6 inhibitor  -her staging CT and bone scan was negative for distant metastasis, except a 1cm left axillary node which is likely reactive to surgery, will f/u on CT in 6 months  -she started adjuvant chemo AC on 11/12/2023, and weekly Taxol on 01/07/24.

## 2024-01-28 ENCOUNTER — Inpatient Hospital Stay: Payer: 59

## 2024-01-28 ENCOUNTER — Encounter: Payer: Self-pay | Admitting: *Deleted

## 2024-01-28 ENCOUNTER — Inpatient Hospital Stay (HOSPITAL_BASED_OUTPATIENT_CLINIC_OR_DEPARTMENT_OTHER): Payer: 59 | Admitting: Hematology

## 2024-01-28 VITALS — BP 136/74 | HR 84 | Temp 98.4°F | Resp 18 | Ht 62.0 in | Wt 181.9 lb

## 2024-01-28 DIAGNOSIS — D649 Anemia, unspecified: Secondary | ICD-10-CM | POA: Diagnosis not present

## 2024-01-28 DIAGNOSIS — Z1721 Progesterone receptor positive status: Secondary | ICD-10-CM | POA: Diagnosis not present

## 2024-01-28 DIAGNOSIS — Z170 Estrogen receptor positive status [ER+]: Secondary | ICD-10-CM

## 2024-01-28 DIAGNOSIS — Z5111 Encounter for antineoplastic chemotherapy: Secondary | ICD-10-CM | POA: Diagnosis not present

## 2024-01-28 DIAGNOSIS — C50912 Malignant neoplasm of unspecified site of left female breast: Secondary | ICD-10-CM

## 2024-01-28 DIAGNOSIS — Z1732 Human epidermal growth factor receptor 2 negative status: Secondary | ICD-10-CM | POA: Diagnosis not present

## 2024-01-28 DIAGNOSIS — C50412 Malignant neoplasm of upper-outer quadrant of left female breast: Secondary | ICD-10-CM | POA: Diagnosis not present

## 2024-01-28 DIAGNOSIS — C773 Secondary and unspecified malignant neoplasm of axilla and upper limb lymph nodes: Secondary | ICD-10-CM | POA: Diagnosis not present

## 2024-01-28 DIAGNOSIS — Z95828 Presence of other vascular implants and grafts: Secondary | ICD-10-CM

## 2024-01-28 LAB — CMP (CANCER CENTER ONLY)
ALT: 25 U/L (ref 0–44)
AST: 21 U/L (ref 15–41)
Albumin: 3.9 g/dL (ref 3.5–5.0)
Alkaline Phosphatase: 106 U/L (ref 38–126)
Anion gap: 4 — ABNORMAL LOW (ref 5–15)
BUN: 7 mg/dL — ABNORMAL LOW (ref 8–23)
CO2: 28 mmol/L (ref 22–32)
Calcium: 10.4 mg/dL — ABNORMAL HIGH (ref 8.9–10.3)
Chloride: 110 mmol/L (ref 98–111)
Creatinine: 0.56 mg/dL (ref 0.44–1.00)
GFR, Estimated: >60 mL/min (ref >60)
Glucose, Bld: 92 mg/dL (ref 70–99)
Potassium: 3.6 mmol/L (ref 3.5–5.1)
Sodium: 142 mmol/L (ref 135–145)
Total Bilirubin: 0.3 mg/dL (ref 0.0–1.2)
Total Protein: 6.6 g/dL (ref 6.5–8.1)

## 2024-01-28 LAB — CBC WITH DIFFERENTIAL (CANCER CENTER ONLY)
Abs Immature Granulocytes: 0.02 10*3/uL (ref 0.00–0.07)
Basophils Absolute: 0.1 10*3/uL (ref 0.0–0.1)
Basophils Relative: 1 %
Eosinophils Absolute: 0.3 10*3/uL (ref 0.0–0.5)
Eosinophils Relative: 6 %
HCT: 28.5 % — ABNORMAL LOW (ref 36.0–46.0)
Hemoglobin: 9.5 g/dL — ABNORMAL LOW (ref 12.0–15.0)
Immature Granulocytes: 0 %
Lymphocytes Relative: 23 %
Lymphs Abs: 1.4 10*3/uL (ref 0.7–4.0)
MCH: 29.2 pg (ref 26.0–34.0)
MCHC: 33.3 g/dL (ref 30.0–36.0)
MCV: 87.7 fL (ref 80.0–100.0)
Monocytes Absolute: 0.6 10*3/uL (ref 0.1–1.0)
Monocytes Relative: 10 %
Neutro Abs: 3.6 10*3/uL (ref 1.7–7.7)
Neutrophils Relative %: 60 %
Platelet Count: 284 10*3/uL (ref 150–400)
RBC: 3.25 MIL/uL — ABNORMAL LOW (ref 3.87–5.11)
RDW: 16.9 % — ABNORMAL HIGH (ref 11.5–15.5)
WBC Count: 6 10*3/uL (ref 4.0–10.5)
nRBC: 0 % (ref 0.0–0.2)

## 2024-01-28 MED ORDER — FAMOTIDINE IN NACL 20-0.9 MG/50ML-% IV SOLN
20.0000 mg | Freq: Once | INTRAVENOUS | Status: AC
  Administered 2024-01-28: 20 mg via INTRAVENOUS
  Filled 2024-01-28: qty 50

## 2024-01-28 MED ORDER — SODIUM CHLORIDE 0.9% FLUSH: 10.0000 mL | INTRAVENOUS | Status: DC | PRN

## 2024-01-28 MED ORDER — SODIUM CHLORIDE 0.9 % IV SOLN
80.0000 mg/m2 | Freq: Once | INTRAVENOUS | Status: AC
  Administered 2024-01-28: 156 mg via INTRAVENOUS
  Filled 2024-01-28: qty 26

## 2024-01-28 MED ORDER — DEXAMETHASONE SODIUM PHOSPHATE 10 MG/ML IJ SOLN
10.0000 mg | Freq: Once | INTRAMUSCULAR | Status: AC
  Administered 2024-01-28: 10 mg via INTRAVENOUS
  Filled 2024-01-28: qty 1

## 2024-01-28 MED ORDER — SODIUM CHLORIDE 0.9% FLUSH
10.0000 mL | Freq: Once | INTRAVENOUS | Status: AC
Start: 2024-01-28 — End: 2024-01-28
  Administered 2024-01-28: 10 mL

## 2024-01-28 MED ORDER — SODIUM CHLORIDE 0.9 % IV SOLN
INTRAVENOUS | Status: DC
Start: 2024-01-28 — End: 2024-01-28

## 2024-01-28 MED ORDER — DIPHENHYDRAMINE HCL 50 MG/ML IJ SOLN
50.0000 mg | Freq: Once | INTRAMUSCULAR | Status: AC
  Administered 2024-01-28: 50 mg via INTRAVENOUS
  Filled 2024-01-28: qty 1

## 2024-01-28 MED ORDER — HEPARIN SOD (PORK) LOCK FLUSH 100 UNIT/ML IV SOLN: 500.0000 [IU] | Freq: Once | INTRAVENOUS | Status: DC | PRN

## 2024-01-28 NOTE — Patient Instructions (Signed)

## 2024-01-28 NOTE — Progress Notes (Signed)
 [facility]   [phone] [phone]   Clinic Follow up Note   Patient Care Team: Erick Alley, DO as PCP - General (Family Medicine) Runell Gess, MD as PCP - Cardiology (Cardiology) York Spaniel, MD (Inactive) as Consulting Physician (Neurology) Malachy Mood, MD as Consulting Physician (Hematology and Oncology)  Date of Service:  01/28/2024  CHIEF COMPLAINT: f/u of breast cancer  CURRENT THERAPY:  Adjuvant chemotherapy with weekly Taxol  Oncology History   Malignant neoplasm of upper-outer quadrant of left breast in female, estrogen receptor positive (HCC) -pT3N3aM0, stage IIIA, ER+/PR+/HER2- -diagnosed in 09/2023. Patient presented with a palpable left breast mass, ultrasound showed multiple enlarged left axillary lymph node. -Due to her underlying dementia, she underwent upfront surgery on 10/04/2023.  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 -I discussed the high risk of recurrence due to large number of positive lymph node, and the benefit of adjuvant chemotherapy, followed by adjuvant radiation, aromatase inhibitor and CDK4/6 inhibitor  -her staging CT and bone scan was negative for distant metastasis, except a 1cm left axillary node which is likely reactive to surgery, will f/u on CT in 6 months  -she started adjuvant chemo AC on 11/12/2023, and weekly Taxol on 01/07/24.    Assessment & Plan Breast cancer Currently on cycle eight of a twelve-week chemotherapy regimen for breast cancer. Reports alopecia and mild neuropathy, including numbness and tingling in fingers and toes. Neuropathy is mild but requires close monitoring due to potential for prolonged and incomplete recovery. She is able to perform basic self-care activities but experiences limited energy. Family members assist with daily tasks. Plan to complete chemotherapy and proceed with radiation therapy after a three to four-week recovery  period. Discussed that neuropathy is a common side effect of chemotherapy and may necessitate dose reduction if symptoms worsen. - Continue chemotherapy regimen for a total of twelve weeks - Monitor neuropathy symptoms closely and consider dose reduction if symptoms worsen - Proceed with radiation therapy three to four weeks after completing chemotherapy - Monitor blood counts and kidney/liver function before treatment - Use ice packs during chemotherapy to manage neuropathy symptoms  Plan -She is overall tolerating chemotherapy well -Lab reviewed, adequate for treatment, will proceed Taxol today and continue weekly - Schedule follow-up appointment on February 11, 2024      SUMMARY OF ONCOLOGIC HISTORY: Oncology History  Infiltrating ductal carcinoma of breast (HCC)  09/22/2023 Initial Diagnosis   Infiltrating ductal carcinoma of breast (HCC)   11/12/2023 -  Chemotherapy   Patient is on Treatment Plan : BREAST DOSE DENSE AC q14d / PACLitaxel q7d     Malignant neoplasm of upper-outer quadrant of left breast in female, estrogen receptor positive (HCC)  09/28/2023 Initial Diagnosis   Malignant neoplasm of upper-outer quadrant of left breast in female, estrogen receptor positive (HCC)   09/28/2023 Cancer Staging   Staging form: Breast, AJCC 8th Edition - Clinical stage from 09/28/2023: Stage IIA (cT2, cN1, cM0, G2, ER+, PR+, HER2-) - Signed by Malachy Mood, MD on 09/28/2023 Histologic grading system: 3 grade system   10/04/2023 Cancer Staging   Staging form: Breast, AJCC 8th Edition - Pathologic stage from 10/04/2023: Stage IIIA (pT2, pN3a, cM0, G2, ER+, PR+, HER2-) - Signed by Malachy Mood, MD on 10/19/2023 Histologic grading system: 3 grade system Residual tumor (R): R0 - None   10/31/2023 Imaging   NM whole body bone scan IMPRESSION: No evidence skeletal metastasis.   10/31/2023 Imaging  CT chest abdomen and pelvis with contrast  IMPRESSION: 1. Status post lumpectomy of the upper  outer left breast and left axillary lymph node dissection with associated simple attenuation fluid collections, most consistent with postoperative seromas. 2. Enlarged left axillary lymph node measuring 1.0 x 0.8 cm, nonspecific and possibly reactive in the setting of recent surgery. Correlate with tissue findings of lymph node dissection. Attention on follow-up. 3. No other evidence of lymphadenopathy or metastatic disease in the chest, abdomen, or pelvis. 4. Nonobstructive bilateral nephrolithiasis.   11/08/2023 Echocardiogram   Echocardiogram FINDINGS   Left Ventricle: Left ventricular ejection fraction, by estimation, is 55 to 60%. Left ventricular ejection fraction by 3D volume is 56 %. The left ventricle has normal function. The left ventricle has no regional wall motion abnormalities. Global longitudinal strain performed but not reported based on interpreter judgement due to suboptimal tracking. The left ventricular internal cavity size was mildly dilated. There is mild left ventricular hypertrophy. Left ventricular diastolic parameters are consistent with Grade I diastolic dysfunction (impaired relaxation). Indeterminate filling pressures.  Right Ventricle: The right ventricular size is normal. No increase in right ventricular wall thickness. Right ventricular systolic function is  low normal. There is normal pulmonary artery systolic pressure. The tricuspid regurgitant velocity is 2.12 m/s,  and with an assumed right atrial pressure of 3 mmHg, the estimated right ventricular systolic pressure is 21.0 mmHg.  Left Atrium: Left atrial size was normal in size.  Right Atrium: Right atrial size was normal in size.  Pericardium: There is no evidence of pericardial effusion.  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 No evidence of mitral valve stenosis.  Tricuspid Valve: The tricuspid valve is normal in structure. Tricuspid valve regurgitation is mild . No  evidence of tricuspid stenosis.  Aortic Valve: The aortic valve is tricuspid. Aortic valve regurgitation is not visualized. No aortic stenosis is present.  Pulmonic Valve: The pulmonic valve was normal in structure. Pulmonic valve regurgitation is mild. No evidence of pulmonic stenosis.  Aorta: The aortic root and ascending aorta are structurally normal, with no evidence of dilitation.  Venous: The inferior vena cava is normal in size with greater than 50% respiratory variability, suggesting right atrial pressure of 3 mmHg.  IAS/Shunts: There is redundancy of the interatrial septum. Cannot exclude a small PFO.        Discussed the use of AI scribe software for clinical note transcription with the patient, who gave verbal consent to proceed.  History of Present Illness A 68 year old patient with a history of breast cancer is currently undergoing chemotherapy. She reports hair loss as a side effect of the treatment, which she understands is temporary and expects to see regrowth approximately three months after the completion of chemotherapy. She is currently on cycle eight of her chemotherapy regimen, with eight more cycles remaining. She is aware that radiation therapy will follow after a recovery period of three to four weeks post-chemotherapy.  The patient reports a good energy level despite the ongoing treatment. She is able to perform daily activities such as showering, dressing, and light walking. She is currently staying with family members who assist with most tasks. She also reports mild neuropathy, experiencing a slight numbness and tingling in her fingers and toes. She has been using ice packs during chemotherapy as a preventive measure against neuropathy.     All other systems were reviewed with the patient and are negative.  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   Hyperlipidemia    Hyperparathyroidism (HCC) 07/25/2019   Hypertension    Normal cardiac stress test    Myoview stress test   OAB (overactive bladder) 09/01/2015   Osteopenia 05/2018   T score -1.1 FRAX 2.5%/ 0.1%   Subclinical hyperthyroidism 05/08/2007   Qualifier: Diagnosis of  By: Jimmey Ralph MD, Victorino Dike      SURGICAL HISTORY: Past Surgical History:  Procedure Laterality Date   BREAST BIOPSY Left 09/19/2023   x's 2   BREAST BIOPSY Left 09/19/2023   [place] LT BREAST BX W LOC DEV 1ST LESION IMG BX SPEC US GUIDE 09/19/2023 [facility]   BREAST BIOPSY Left 10/03/2023   [place] LT RADIOACTIVE SEED LOC 10/03/2023 [facility]   BREAST BIOPSY Left 10/03/2023   [place] LT RADIOACTIVE SEED EA ADD LESION 10/03/2023 [facility]   BREAST LUMPECTOMY WITH RADIOACTIVE SEED AND AXILLARY LYMPH NODE DISSECTION Left 10/04/2023   Procedure: LEFT BREAST LUMPECTOMY AND TARGETED LYMPH NODE DISSECTION;  Surgeon: Abigail Miyamoto, MD;  Location: [facility];  Service: General;  Laterality: Left;   COLONOSCOPY     IR IMAGING GUIDED PORT INSERTION  11/05/2023   POLYPECTOMY     TOTAL HIP ARTHROPLASTY Left 10/01/2013   DR ROWAN   TOTAL HIP ARTHROPLASTY Left 10/01/2013   Procedure: TOTAL HIP ARTHROPLASTY;  Surgeon: [name], MD;  Location: [facility];  Service: Orthopedics;  Laterality: Left;    I have reviewed the social history and family history with the patient and they are unchanged from previous note.  ALLERGIES:  is allergic to [facility] [aprepitant], hydrochlorothiazide, hydrocodone, and simvastatin.  MEDICATIONS:  Current Outpatient Medications  Medication Sig Dispense Refill   albuterol  (VENTOLIN HFA) 108 (90 Base) MCG/ACT inhaler USE 2 INHALATIONS BY MOUTH EVERY 6 HOURS AS NEEDED FOR WHEEZING  OR SHORTNESS OF BREATH (Patient taking differently: Inhale 2 puffs into the lungs every 6 (six) hours as needed for wheezing or shortness of breath.) 26.8 g 2   amLODipine (NORVASC) 10 MG tablet TAKE 1 TABLET BY MOUTH EVERY  MORNING 100 tablet 2   aspirin EC 81 MG tablet Take 81 mg by mouth daily. Swallow whole.     Budesonide 90 MCG/ACT inhaler Inhale 1 puff into the lungs 2 (two) times daily. 1 each 0   capsaicin (ZOSTRIX) 0.025 % cream Apply topically 2 (two) times daily. 60 g 0   cholecalciferol (VITAMIN D3) 25 MCG (1000 UNIT) tablet Take 1 tablet (1,000 Units total) by mouth daily. 7 tablet 0   Cholecalciferol (VITAMIN D3) 50 MCG (2000 UT) CHEW Chew 2,000 Units by mouth daily.     donepezil (ARICEPT) 10 MG tablet Take 10 mg by mouth at bedtime.     fluticasone (FLONASE) 50 MCG/ACT nasal spray USE 1 SPRAY IN BOTH NOSTRILS  DAILY (Patient taking differently: Place 1 spray into both nostrils daily as needed for allergies or rhinitis.) 16 g 0   fluticasone (FLOVENT HFA) 110 MCG/ACT inhaler Inhale 1 puff  into the lungs daily as needed. (Patient taking differently: Inhale 1 puff into the lungs daily as needed ("for flares").) 1 each 12   gabapentin (NEURONTIN) 100 MG capsule TAKE 1 CAPSULE BY MOUTH DAILY 90 capsule 3   lidocaine-prilocaine (EMLA) cream Apply to affected area once 30 g 3   loratadine (EQ ALLERGY RELIEF) 10 MG tablet Take 1 tablet (10 mg total) by mouth daily. (Patient taking differently: Take 10 mg by mouth daily as needed for allergies or rhinitis.) 90 tablet 3   losartan (COZAAR) 50 MG tablet Take 1 tablet (50 mg total) by mouth at bedtime. 90 tablet 3   metoprolol tartrate (LOPRESSOR) 50 MG tablet TAKE 1 TABLET BY MOUTH TWICE  DAILY 200 tablet 2   ondansetron (ZOFRAN) 8 MG tablet Take 1 tab (8 mg) by mouth every 8 hrs as needed for nausea/vomiting. Start third day after  doxorubicin/cyclophosphamide chemotherapy. 30 tablet 1   ondansetron (ZOFRAN-ODT) 4 MG disintegrating tablet Take 4 mg by mouth every 8 (eight) hours as needed for nausea or vomiting (dissolve orally).     prochlorperazine (COMPAZINE) 10 MG tablet Take 1 tablet (10 mg total) by mouth every 6 (six) hours as needed for nausea or vomiting. 30 tablet 1   rosuvastatin (CRESTOR) 5 MG tablet Take one tablet by mouth every Monday and Friday. (Patient taking differently: Take 5 mg by mouth See admin instructions. Take 5 mg by mouth every Monday and Friday) 20 tablet 4   sertraline (ZOLOFT) 50 MG tablet Take 1 tablet (50 mg total) by mouth daily. 30 tablet 0   traMADol (ULTRAM) 50 MG tablet Take 1 tablet (50 mg total) by mouth every 6 (six) hours as needed for moderate pain (pain score 4-6) or severe pain (pain score 7-10). 25 tablet 0   TYLENOL 500 MG tablet Take 500-1,000 mg by mouth every 6 (six) hours as needed for mild pain (pain score 1-3) or headache.     No current facility-administered medications for this visit.   Facility-Administered Medications Ordered in Other Visits  Medication Dose Route Frequency Provider Last Rate Last Admin   0.9 %  sodium chloride infusion   Intravenous Continuous Malachy Mood, MD   Stopped at 01/28/24 1438   heparin lock flush 100 unit/mL  500 Units Intracatheter Once PRN Malachy Mood, MD       sodium chloride flush (NS) 0.9 % injection 10 mL  10 mL Intracatheter PRN Malachy Mood, MD        PHYSICAL EXAMINATION: ECOG PERFORMANCE STATUS: 1 - Symptomatic but completely ambulatory  Vitals:   01/28/24 1144  BP: 136/74  Pulse: 84  Resp: 18  Temp: 98.4 F (36.9 C)  SpO2: 98%   Wt Readings from Last 3 Encounters:  01/28/24 181 lb 14.4 oz (82.5 kg)  01/24/24 180 lb 9.6 oz (81.9 kg)  01/17/24 182 lb 9.6 oz (82.8 kg)     GENERAL:alert, no distress and comfortable SKIN: skin color, texture, turgor are normal, no rashes or significant lesions EYES: normal, Conjunctiva are  pink and non-injected, sclera clear NECK: supple, thyroid normal size, non-tender, without nodularity LYMPH:  no palpable lymphadenopathy in the cervical, axillary  LUNGS: clear to auscultation and percussion with normal breathing effort HEART: regular rate & rhythm and no murmurs and no lower extremity edema ABDOMEN:abdomen soft, non-tender and normal bowel sounds Musculoskeletal:no cyanosis of digits and no clubbing  NEURO: alert & oriented x 3 with fluent speech, no focal motor/sensory deficits  Physical Exam  LABORATORY DATA:  I have reviewed the data as listed    Latest Ref Rng & Units 01/28/2024   11:15 AM 01/21/2024    8:20 AM 01/14/2024    7:56 AM  CBC  WBC 4.0 - 10.5 K/uL 6.0  5.4  4.5   Hemoglobin 12.0 - 15.0 g/dL 9.5  16.1  7.7   Hematocrit 36.0 - 46.0 % 28.5  30.5  24.1   Platelets 150 - 400 K/uL 284  278  280         Latest Ref Rng & Units 01/28/2024   11:15 AM 01/21/2024    8:20 AM 01/14/2024    7:56 AM  CMP  Glucose 70 - 99 mg/dL 92  096  045   BUN 8 - 23 mg/dL 7  6  6    Creatinine 0.44 - 1.00 mg/dL 4.09  8.11  9.14   Sodium 135 - 145 mmol/L 142  142  143   Potassium 3.5 - 5.1 mmol/L 3.6  3.4  3.5   Chloride 98 - 111 mmol/L 110  110  111   CO2 22 - 32 mmol/L 28  27  27    Calcium 8.9 - 10.3 mg/dL 78.2  95.6  21.3   Total Protein 6.5 - 8.1 g/dL 6.6  6.8  6.0   Total Bilirubin 0.0 - 1.2 mg/dL 0.3  0.4  0.3   Alkaline Phos 38 - 126 U/L 106  102  84   AST 15 - 41 U/L 21  31  19    ALT 0 - 44 U/L 25  29  18        RADIOGRAPHIC STUDIES: I have personally reviewed the radiological images as listed and agreed with the findings in the report. No results found.    Orders Placed This Encounter  Procedures   CBC with Differential (Cancer Center Only)    Standing Status:   Future    Expected Date:   03/10/2024    Expiration Date:   03/10/2025   CMP (Cancer Center only)    Standing Status:   Future    Expected Date:   03/10/2024    Expiration Date:   03/10/2025    CBC with Differential (Cancer Center Only)    Standing Status:   Future    Expected Date:   03/17/2024    Expiration Date:   03/17/2025   CMP (Cancer Center only)    Standing Status:   Future    Expected Date:   03/17/2024    Expiration Date:   03/17/2025   CBC with Differential (Cancer Center Only)    Standing Status:   Future    Expected Date:   03/24/2024    Expiration Date:   03/24/2025   CMP (Cancer Center only)    Standing Status:   Future    Expected Date:   03/24/2024    Expiration Date:   03/24/2025   CBC with Differential (Cancer Center Only)    Standing Status:   Future    Expected Date:   03/31/2024    Expiration Date:   03/31/2025   CMP (Cancer Center only)    Standing Status:   Future    Expected Date:   03/31/2024    Expiration Date:   03/31/2025   All questions were answered. The patient knows to call the clinic with any problems, questions or concerns. No barriers to learning was detected. The total time spent in the appointment was 25 minutes.     Terrace Arabia  Maryalice Smaller, MD 01/28/2024

## 2024-02-02 ENCOUNTER — Other Ambulatory Visit: Payer: Self-pay | Admitting: Student

## 2024-02-02 DIAGNOSIS — R4589 Other symptoms and signs involving emotional state: Secondary | ICD-10-CM

## 2024-02-02 DIAGNOSIS — F419 Anxiety disorder, unspecified: Secondary | ICD-10-CM

## 2024-02-04 ENCOUNTER — Encounter: Payer: Self-pay | Admitting: Hematology

## 2024-02-04 ENCOUNTER — Inpatient Hospital Stay

## 2024-02-04 VITALS — BP 124/72 | HR 68 | Temp 97.9°F | Resp 18 | Wt 182.0 lb

## 2024-02-04 DIAGNOSIS — Z170 Estrogen receptor positive status [ER+]: Secondary | ICD-10-CM | POA: Diagnosis not present

## 2024-02-04 DIAGNOSIS — C773 Secondary and unspecified malignant neoplasm of axilla and upper limb lymph nodes: Secondary | ICD-10-CM | POA: Diagnosis not present

## 2024-02-04 DIAGNOSIS — Z1721 Progesterone receptor positive status: Secondary | ICD-10-CM | POA: Diagnosis not present

## 2024-02-04 DIAGNOSIS — Z95828 Presence of other vascular implants and grafts: Secondary | ICD-10-CM

## 2024-02-04 DIAGNOSIS — C50412 Malignant neoplasm of upper-outer quadrant of left female breast: Secondary | ICD-10-CM

## 2024-02-04 DIAGNOSIS — Z5111 Encounter for antineoplastic chemotherapy: Secondary | ICD-10-CM | POA: Diagnosis not present

## 2024-02-04 DIAGNOSIS — D649 Anemia, unspecified: Secondary | ICD-10-CM | POA: Diagnosis not present

## 2024-02-04 DIAGNOSIS — Z1732 Human epidermal growth factor receptor 2 negative status: Secondary | ICD-10-CM | POA: Diagnosis not present

## 2024-02-04 DIAGNOSIS — C50912 Malignant neoplasm of unspecified site of left female breast: Secondary | ICD-10-CM

## 2024-02-04 LAB — CBC WITH DIFFERENTIAL (CANCER CENTER ONLY)
Abs Immature Granulocytes: 0.05 10*3/uL (ref 0.00–0.07)
Basophils Absolute: 0.1 10*3/uL (ref 0.0–0.1)
Basophils Relative: 1 %
Eosinophils Absolute: 0.4 10*3/uL (ref 0.0–0.5)
Eosinophils Relative: 7 %
HCT: 29.4 % — ABNORMAL LOW (ref 36.0–46.0)
Hemoglobin: 9.5 g/dL — ABNORMAL LOW (ref 12.0–15.0)
Immature Granulocytes: 1 %
Lymphocytes Relative: 23 %
Lymphs Abs: 1.2 10*3/uL (ref 0.7–4.0)
MCH: 28.9 pg (ref 26.0–34.0)
MCHC: 32.3 g/dL (ref 30.0–36.0)
MCV: 89.4 fL (ref 80.0–100.0)
Monocytes Absolute: 0.3 10*3/uL (ref 0.1–1.0)
Monocytes Relative: 6 %
Neutro Abs: 3.2 10*3/uL (ref 1.7–7.7)
Neutrophils Relative %: 62 %
Platelet Count: 251 10*3/uL (ref 150–400)
RBC: 3.29 MIL/uL — ABNORMAL LOW (ref 3.87–5.11)
RDW: 16.9 % — ABNORMAL HIGH (ref 11.5–15.5)
WBC Count: 5.1 10*3/uL (ref 4.0–10.5)
nRBC: 0 % (ref 0.0–0.2)

## 2024-02-04 LAB — CMP (CANCER CENTER ONLY)
ALT: 16 U/L (ref 0–44)
AST: 15 U/L (ref 15–41)
Albumin: 3.8 g/dL (ref 3.5–5.0)
Alkaline Phosphatase: 96 U/L (ref 38–126)
Anion gap: 6 (ref 5–15)
BUN: 10 mg/dL (ref 8–23)
CO2: 27 mmol/L (ref 22–32)
Calcium: 10.3 mg/dL (ref 8.9–10.3)
Chloride: 110 mmol/L (ref 98–111)
Creatinine: 0.64 mg/dL (ref 0.44–1.00)
GFR, Estimated: >60 mL/min (ref >60)
Glucose, Bld: 98 mg/dL (ref 70–99)
Potassium: 3.4 mmol/L — ABNORMAL LOW (ref 3.5–5.1)
Sodium: 143 mmol/L (ref 135–145)
Total Bilirubin: 0.3 mg/dL (ref 0.0–1.2)
Total Protein: 6.5 g/dL (ref 6.5–8.1)

## 2024-02-04 MED ORDER — FAMOTIDINE IN NACL 20-0.9 MG/50ML-% IV SOLN
20.0000 mg | Freq: Once | INTRAVENOUS | Status: AC
  Administered 2024-02-04: 20 mg via INTRAVENOUS
  Filled 2024-02-04: qty 50

## 2024-02-04 MED ORDER — SODIUM CHLORIDE 0.9 % IV SOLN
80.0000 mg/m2 | Freq: Once | INTRAVENOUS | Status: AC
  Administered 2024-02-04: 156 mg via INTRAVENOUS
  Filled 2024-02-04: qty 26

## 2024-02-04 MED ORDER — SODIUM CHLORIDE 0.9 % IV SOLN: INTRAVENOUS | Status: DC

## 2024-02-04 MED ORDER — SODIUM CHLORIDE 0.9% FLUSH
10.0000 mL | Freq: Once | INTRAVENOUS | Status: AC
  Administered 2024-02-04: 10 mL

## 2024-02-04 MED ORDER — HEPARIN SOD (PORK) LOCK FLUSH 100 UNIT/ML IV SOLN
500.0000 [IU] | Freq: Once | INTRAVENOUS | Status: AC | PRN
  Administered 2024-02-04: 500 [IU]

## 2024-02-04 MED ORDER — SODIUM CHLORIDE 0.9% FLUSH
10.0000 mL | INTRAVENOUS | Status: DC | PRN
  Administered 2024-02-04: 10 mL

## 2024-02-04 MED ORDER — DEXAMETHASONE SODIUM PHOSPHATE 10 MG/ML IJ SOLN
10.0000 mg | Freq: Once | INTRAMUSCULAR | Status: AC
  Administered 2024-02-04: 10 mg via INTRAVENOUS
  Filled 2024-02-04: qty 1

## 2024-02-04 MED ORDER — DIPHENHYDRAMINE HCL 50 MG/ML IJ SOLN
50.0000 mg | Freq: Once | INTRAMUSCULAR | Status: AC
  Administered 2024-02-04: 50 mg via INTRAVENOUS
  Filled 2024-02-04: qty 1

## 2024-02-04 NOTE — Patient Instructions (Signed)

## 2024-02-09 NOTE — Assessment & Plan Note (Signed)
-  [id number], stage IIIA, ER+/PR+/HER2- -diagnosed in 09/2023. Patient presented with a palpable left breast mass, ultrasound showed multiple enlarged left axillary lymph node. -Due to her underlying dementia, she underwent upfront surgery on 10/04/2023.  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 -I discussed the high risk of recurrence due to large number of positive lymph node, and the benefit of adjuvant chemotherapy, followed by adjuvant radiation, aromatase inhibitor and CDK4/6 inhibitor  -her staging CT and bone scan was negative for distant metastasis, except a 1cm left axillary node which is likely reactive to surgery, will f/u on CT in 6 months  -she started adjuvant chemo AC on 11/12/2023, and weekly Taxol on 01/07/24.

## 2024-02-11 ENCOUNTER — Inpatient Hospital Stay (HOSPITAL_BASED_OUTPATIENT_CLINIC_OR_DEPARTMENT_OTHER): Admitting: Hematology

## 2024-02-11 ENCOUNTER — Inpatient Hospital Stay

## 2024-02-11 ENCOUNTER — Encounter: Payer: Self-pay | Admitting: Hematology

## 2024-02-11 VITALS — BP 114/87 | HR 70 | Temp 97.7°F | Resp 20 | Ht 62.0 in | Wt 183.9 lb

## 2024-02-11 DIAGNOSIS — C50412 Malignant neoplasm of upper-outer quadrant of left female breast: Secondary | ICD-10-CM

## 2024-02-11 DIAGNOSIS — Z5111 Encounter for antineoplastic chemotherapy: Secondary | ICD-10-CM | POA: Diagnosis not present

## 2024-02-11 DIAGNOSIS — Z95828 Presence of other vascular implants and grafts: Secondary | ICD-10-CM

## 2024-02-11 DIAGNOSIS — Z170 Estrogen receptor positive status [ER+]: Secondary | ICD-10-CM

## 2024-02-11 DIAGNOSIS — C50912 Malignant neoplasm of unspecified site of left female breast: Secondary | ICD-10-CM

## 2024-02-11 DIAGNOSIS — Z1732 Human epidermal growth factor receptor 2 negative status: Secondary | ICD-10-CM | POA: Diagnosis not present

## 2024-02-11 DIAGNOSIS — C773 Secondary and unspecified malignant neoplasm of axilla and upper limb lymph nodes: Secondary | ICD-10-CM | POA: Diagnosis not present

## 2024-02-11 DIAGNOSIS — D649 Anemia, unspecified: Secondary | ICD-10-CM | POA: Diagnosis not present

## 2024-02-11 DIAGNOSIS — Z1721 Progesterone receptor positive status: Secondary | ICD-10-CM | POA: Diagnosis not present

## 2024-02-11 LAB — CBC WITH DIFFERENTIAL (CANCER CENTER ONLY)
Abs Immature Granulocytes: 0.07 10*3/uL (ref 0.00–0.07)
Basophils Absolute: 0.1 10*3/uL (ref 0.0–0.1)
Basophils Relative: 1 %
Eosinophils Absolute: 0.2 10*3/uL (ref 0.0–0.5)
Eosinophils Relative: 6 %
HCT: 28 % — ABNORMAL LOW (ref 36.0–46.0)
Hemoglobin: 9.3 g/dL — ABNORMAL LOW (ref 12.0–15.0)
Immature Granulocytes: 2 %
Lymphocytes Relative: 25 %
Lymphs Abs: 1 10*3/uL (ref 0.7–4.0)
MCH: 29.4 pg (ref 26.0–34.0)
MCHC: 33.2 g/dL (ref 30.0–36.0)
MCV: 88.6 fL (ref 80.0–100.0)
Monocytes Absolute: 0.3 10*3/uL (ref 0.1–1.0)
Monocytes Relative: 8 %
Neutro Abs: 2.2 10*3/uL (ref 1.7–7.7)
Neutrophils Relative %: 58 %
Platelet Count: 216 10*3/uL (ref 150–400)
RBC: 3.16 MIL/uL — ABNORMAL LOW (ref 3.87–5.11)
RDW: 16.9 % — ABNORMAL HIGH (ref 11.5–15.5)
WBC Count: 3.7 10*3/uL — ABNORMAL LOW (ref 4.0–10.5)
nRBC: 0 % (ref 0.0–0.2)

## 2024-02-11 LAB — CMP (CANCER CENTER ONLY)
ALT: 13 U/L (ref 0–44)
AST: 16 U/L (ref 15–41)
Albumin: 3.7 g/dL (ref 3.5–5.0)
Alkaline Phosphatase: 90 U/L (ref 38–126)
Anion gap: 6 (ref 5–15)
BUN: 7 mg/dL — ABNORMAL LOW (ref 8–23)
CO2: 26 mmol/L (ref 22–32)
Calcium: 9.9 mg/dL (ref 8.9–10.3)
Chloride: 110 mmol/L (ref 98–111)
Creatinine: 0.69 mg/dL (ref 0.44–1.00)
GFR, Estimated: >60 mL/min (ref >60)
Glucose, Bld: 145 mg/dL — ABNORMAL HIGH (ref 70–99)
Potassium: 3.2 mmol/L — ABNORMAL LOW (ref 3.5–5.1)
Sodium: 142 mmol/L (ref 135–145)
Total Bilirubin: 0.4 mg/dL (ref 0.0–1.2)
Total Protein: 6.2 g/dL — ABNORMAL LOW (ref 6.5–8.1)

## 2024-02-11 MED ORDER — SODIUM CHLORIDE 0.9 % IV SOLN: INTRAVENOUS | Status: DC

## 2024-02-11 MED ORDER — DIPHENHYDRAMINE HCL 50 MG/ML IJ SOLN
50.0000 mg | Freq: Once | INTRAMUSCULAR | Status: AC
Start: 2024-02-11 — End: 2024-02-11
  Administered 2024-02-11: 50 mg via INTRAVENOUS
  Filled 2024-02-11: qty 1

## 2024-02-11 MED ORDER — SODIUM CHLORIDE 0.9% FLUSH: 10.0000 mL | INTRAVENOUS | Status: DC | PRN

## 2024-02-11 MED ORDER — SODIUM CHLORIDE 0.9% FLUSH
10.0000 mL | Freq: Once | INTRAVENOUS | Status: AC
  Administered 2024-02-11: 10 mL

## 2024-02-11 MED ORDER — SODIUM CHLORIDE 0.9 % IV SOLN
80.0000 mg/m2 | Freq: Once | INTRAVENOUS | Status: AC
  Administered 2024-02-11: 156 mg via INTRAVENOUS
  Filled 2024-02-11: qty 26

## 2024-02-11 MED ORDER — FAMOTIDINE IN NACL 20-0.9 MG/50ML-% IV SOLN
20.0000 mg | Freq: Once | INTRAVENOUS | Status: AC
  Administered 2024-02-11: 20 mg via INTRAVENOUS
  Filled 2024-02-11: qty 50

## 2024-02-11 MED ORDER — POTASSIUM CHLORIDE CRYS ER 20 MEQ PO TBCR
20.0000 meq | EXTENDED_RELEASE_TABLET | Freq: Two times a day (BID) | ORAL | 1 refills | Status: DC
Start: 2024-02-11 — End: 2024-03-17

## 2024-02-11 MED ORDER — HEPARIN SOD (PORK) LOCK FLUSH 100 UNIT/ML IV SOLN: 500.0000 [IU] | Freq: Once | INTRAVENOUS | Status: DC | PRN

## 2024-02-11 MED ORDER — DEXAMETHASONE SODIUM PHOSPHATE 10 MG/ML IJ SOLN
10.0000 mg | Freq: Once | INTRAMUSCULAR | Status: AC
  Administered 2024-02-11: 10 mg via INTRAVENOUS
  Filled 2024-02-11: qty 1

## 2024-02-11 NOTE — Progress Notes (Signed)
 [facility]   [phone] [phone]   Clinic Follow up Note   Patient Care Team: Glenn Lange, DO as PCP - General (Family Medicine) Avanell Leigh, MD as PCP - Cardiology (Cardiology) Brian Campanile, MD (Inactive) as Consulting Physician (Neurology) Sonja [place], MD as Consulting Physician (Hematology and Oncology)  Date of Service:  02/11/2024  CHIEF COMPLAINT: f/u of breast cancer  CURRENT THERAPY:  Weekly paclitaxel   Oncology History   Malignant neoplasm of upper-outer quadrant of left breast in female, estrogen receptor positive (HCC) -pT3N3aM0, stage IIIA, ER+/PR+/HER2- -diagnosed in 09/2023. Patient presented with a palpable left breast mass, ultrasound showed multiple enlarged left axillary lymph node. -Due to her underlying dementia, she underwent upfront surgery on 10/04/2023.  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 -I discussed the high risk of recurrence due to large number of positive lymph node, and the benefit of adjuvant chemotherapy, followed by adjuvant radiation, aromatase inhibitor and CDK4/6 inhibitor  -her staging CT and bone scan was negative for distant metastasis, except a 1cm left axillary node which is likely reactive to surgery, will f/u on CT in 6 months  -she started adjuvant chemo AC on 11/12/2023, and weekly Taxol  on 01/07/24.   Assessment & Plan Breast cancer, undergoing adjuvant chemotherapy Undergoing adjuvant chemotherapy with Taxol  for breast cancer. Completed four weeks with seven weeks remaining. Reports fatigue, common with chemotherapy, but maintains ability to perform daily activities with some assistance. Weight gain indicates good nutritional intake. No new concerns regarding breast incision or port site. No numbness, tingling in extremities, chest discomfort, or gastrointestinal issues reported. Mild mouth sores resolved with mouthwash. - Continue weekly Taxol   infusions for seven more weeks - Schedule follow-up appointments every other week  Mild anemia Mild anemia likely related to chemotherapy. Current lab results do not indicate need for blood transfusion. Kidney and liver function tests pending. - Monitor hemoglobin levels - Review kidney and liver function tests when available  Hypokalemia - Intermittent, potassium 3.2 today - will call in oral potassium supplement  Plan - Labs reviewed, adequate for treatment, will proceed to chemo today and continue Taxol  every week - Will call in oral potassium chloride  20 mill equal daily - Follow-up in 2 weeks  SUMMARY OF ONCOLOGIC HISTORY: Oncology History  Infiltrating ductal carcinoma of breast (HCC)  09/22/2023 Initial Diagnosis   Infiltrating ductal carcinoma of breast (HCC)   11/12/2023 -  Chemotherapy   Patient is on Treatment Plan : BREAST DOSE DENSE AC q14d / PACLitaxel  q7d     Malignant neoplasm of upper-outer quadrant of left breast in female, estrogen receptor positive (HCC)  09/28/2023 Initial Diagnosis   Malignant neoplasm of upper-outer quadrant of left breast in female, estrogen receptor positive (HCC)   09/28/2023 Cancer Staging   Staging form: Breast, AJCC 8th Edition - Clinical stage from 09/28/2023: Stage IIA (cT2, cN1, cM0, G2, ER+, PR+, HER2-) - Signed by Sonja McLouth, MD on 09/28/2023 Histologic grading system: 3 grade system   10/04/2023 Cancer Staging   Staging form: Breast, AJCC 8th Edition - Pathologic stage from 10/04/2023: Stage IIIA (pT2, pN3a, cM0, G2, ER+, PR+, HER2-) - Signed by Sonja Millhousen, MD on 10/19/2023 Histologic grading system: 3 grade system Residual tumor (R): R0 - None   10/31/2023 Imaging   NM whole body bone scan IMPRESSION: No evidence skeletal metastasis.   10/31/2023 Imaging   CT chest abdomen and pelvis with contrast  IMPRESSION: 1. Status post lumpectomy of  the upper outer left breast and left axillary lymph node dissection with associated  simple attenuation fluid collections, most consistent with postoperative seromas. 2. Enlarged left axillary lymph node measuring 1.0 x 0.8 cm, nonspecific and possibly reactive in the setting of recent surgery. Correlate with tissue findings of lymph node dissection. Attention on follow-up. 3. No other evidence of lymphadenopathy or metastatic disease in the chest, abdomen, or pelvis. 4. Nonobstructive bilateral nephrolithiasis.   11/08/2023 Echocardiogram   Echocardiogram FINDINGS   Left Ventricle: Left ventricular ejection fraction, by estimation, is 55 to 60%. Left ventricular ejection fraction by 3D volume is 56 %. The left ventricle has normal function. The left ventricle has no regional wall motion abnormalities. Global longitudinal strain performed but not reported based on interpreter judgement due to suboptimal tracking. The left ventricular internal cavity size was mildly dilated. There is mild left ventricular hypertrophy. Left ventricular diastolic parameters are consistent with Grade I diastolic dysfunction (impaired relaxation). Indeterminate filling pressures.  Right Ventricle: The right ventricular size is normal. No increase in right ventricular wall thickness. Right ventricular systolic function is  low normal. There is normal pulmonary artery systolic pressure. The tricuspid regurgitant velocity is 2.12 m/s,  and with an assumed right atrial pressure of 3 mmHg, the estimated right ventricular systolic pressure is 21.0 mmHg.  Left Atrium: Left atrial size was normal in size.  Right Atrium: Right atrial size was normal in size.  Pericardium: There is no evidence of pericardial effusion.  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 No evidence of mitral valve stenosis.  Tricuspid Valve: The tricuspid valve is normal in structure. Tricuspid valve regurgitation is mild . No evidence of tricuspid stenosis.  Aortic Valve: The aortic valve is tricuspid.  Aortic valve regurgitation is not visualized. No aortic stenosis is present.  Pulmonic Valve: The pulmonic valve was normal in structure. Pulmonic valve regurgitation is mild. No evidence of pulmonic stenosis.  Aorta: The aortic root and ascending aorta are structurally normal, with no evidence of dilitation.  Venous: The inferior vena cava is normal in size with greater than 50% respiratory variability, suggesting right atrial pressure of 3 mmHg.  IAS/Shunts: There is redundancy of the interatrial septum. Cannot exclude a small PFO.        Discussed the use of AI scribe software for clinical note transcription with the patient, who gave verbal consent to proceed.  History of Present Illness The patient, a 68 year old with breast cancer currently on adjuvant chemotherapy, presents for a follow-up visit. She reports fatigue as the primary side effect of the chemotherapy but denies any significant functional impairment. She is able to perform some housework and maintain her personal space. She also engages in light physical activity, such as walking around her neighborhood. Despite the fatigue, she does not take daytime naps and finds ways to keep herself occupied. She has noticed some weight gain, which suggests adequate nutritional intake.  The patient also reports a sore spot on her body from a previous fall off the bed. She denies any bruising or other complications from the fall. She has a port for chemotherapy administration, which she reports is functioning well without any issues. She also mentions a previous issue with mouth sores, which have since resolved with the use of mouthwash.     All other systems were reviewed with the patient and are negative.  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   Hyperlipidemia    Hyperparathyroidism (HCC) 07/25/2019   Hypertension    Normal cardiac stress test    Myoview stress test   OAB (overactive bladder) 09/01/2015   Osteopenia 05/2018   T score -1.1 FRAX 2.5%/ 0.1%   Subclinical hyperthyroidism 05/08/2007   Qualifier: Diagnosis of  By: Daneil Dunker MD, [facility]      SURGICAL HISTORY: Past Surgical History:  Procedure Laterality Date   BREAST BIOPSY Left 09/19/2023   x's 2   BREAST BIOPSY Left 09/19/2023   US  LT BREAST BX W LOC DEV 1ST LESION IMG BX SPEC US  GUIDE 09/19/2023 [facility]   BREAST BIOPSY Left 10/03/2023   US  LT RADIOACTIVE SEED LOC 10/03/2023 [facility]   BREAST BIOPSY Left 10/03/2023   US  LT RADIOACTIVE SEED EA ADD LESION 10/03/2023 [facility]   BREAST LUMPECTOMY WITH RADIOACTIVE SEED AND AXILLARY LYMPH NODE DISSECTION Left 10/04/2023   Procedure: LEFT BREAST LUMPECTOMY AND TARGETED LYMPH NODE DISSECTION;  Surgeon: Oza Blumenthal, MD;  Location: [facility];  Service: General;  Laterality: Left;   COLONOSCOPY     IR IMAGING GUIDED PORT INSERTION  11/05/2023   POLYPECTOMY     TOTAL HIP ARTHROPLASTY Left 10/01/2013   DR ROWAN   TOTAL HIP ARTHROPLASTY Left 10/01/2013   Procedure: TOTAL HIP ARTHROPLASTY;  Surgeon: Ilean Mall, MD;  Location: [facility] OR;  Service: Orthopedics;  Laterality: Left;    I have reviewed the social history and family history with the patient and they are unchanged from previous note.  ALLERGIES:  is allergic to [facility] [aprepitant], hydrochlorothiazide , hydrocodone , and simvastatin.  MEDICATIONS:  Current Outpatient Medications  Medication Sig Dispense Refill   albuterol  (VENTOLIN  HFA) 108 (90 Base) MCG/ACT inhaler USE 2 INHALATIONS BY MOUTH EVERY 6  HOURS AS NEEDED FOR WHEEZING  OR SHORTNESS OF BREATH (Patient taking differently: Inhale 2 puffs into the lungs every 6 (six) hours as needed for wheezing or shortness of breath.) 26.8 g 2   amLODipine  (NORVASC ) 10 MG tablet TAKE 1 TABLET BY MOUTH EVERY  MORNING 100 tablet 2   aspirin  EC 81 MG tablet Take 81 mg by mouth daily. Swallow whole.     Budesonide  90 MCG/ACT inhaler Inhale 1 puff into the lungs 2 (two) times daily. 1 each 0   capsaicin  (ZOSTRIX) 0.025 % cream Apply topically 2 (two) times daily. 60 g 0   cholecalciferol (VITAMIN D3) 25 MCG (1000 UNIT) tablet Take 1 tablet (1,000 Units total) by mouth daily. 7 tablet 0   Cholecalciferol (VITAMIN D3) 50 MCG (2000 UT) CHEW Chew 2,000 Units by mouth daily.     donepezil (ARICEPT) 10 MG tablet Take 10 mg by mouth at bedtime.     fluticasone  (FLONASE ) 50 MCG/ACT nasal spray USE 1 SPRAY IN BOTH NOSTRILS  DAILY (Patient taking differently: Place 1 spray into both nostrils daily as needed for allergies or rhinitis.) 16 g 0   fluticasone  (FLOVENT  HFA) 110 MCG/ACT inhaler Inhale 1 puff into the lungs daily as needed. (Patient taking  differently: Inhale 1 puff into the lungs daily as needed ("for flares").) 1 each 12   gabapentin  (NEURONTIN ) 100 MG capsule TAKE 1 CAPSULE BY MOUTH DAILY 90 capsule 3   lidocaine -prilocaine  (EMLA ) cream Apply to affected area once 30 g 3   loratadine  (EQ ALLERGY RELIEF) 10 MG tablet Take 1 tablet (10 mg total) by mouth daily. (Patient taking differently: Take 10 mg by mouth daily as needed for allergies or rhinitis.) 90 tablet 3   losartan  (COZAAR ) 50 MG tablet Take 1 tablet (50 mg total) by mouth at bedtime. 90 tablet 3   metoprolol  tartrate (LOPRESSOR ) 50 MG tablet TAKE 1 TABLET BY MOUTH TWICE  DAILY 200 tablet 2   ondansetron  (ZOFRAN ) 8 MG tablet Take 1 tab (8 mg) by mouth every 8 hrs as needed for nausea/vomiting. Start third day after doxorubicin /cyclophosphamide  chemotherapy. 30 tablet 1   ondansetron  (ZOFRAN -ODT) 4  MG disintegrating tablet Take 4 mg by mouth every 8 (eight) hours as needed for nausea or vomiting (dissolve orally).     prochlorperazine  (COMPAZINE ) 10 MG tablet Take 1 tablet (10 mg total) by mouth every 6 (six) hours as needed for nausea or vomiting. 30 tablet 1   rosuvastatin  (CRESTOR ) 5 MG tablet Take one tablet by mouth every Monday and Friday. (Patient taking differently: Take 5 mg by mouth See admin instructions. Take 5 mg by mouth every Monday and Friday) 20 tablet 4   sertraline  (ZOLOFT ) 50 MG tablet TAKE 1 TABLET BY MOUTH DAILY 100 tablet 3   traMADol  (ULTRAM ) 50 MG tablet Take 1 tablet (50 mg total) by mouth every 6 (six) hours as needed for moderate pain (pain score 4-6) or severe pain (pain score 7-10). 25 tablet 0   TYLENOL  500 MG tablet Take 500-1,000 mg by mouth every 6 (six) hours as needed for mild pain (pain score 1-3) or headache.     No current facility-administered medications for this visit.   Facility-Administered Medications Ordered in Other Visits  Medication Dose Route Frequency Provider Last Rate Last Admin   0.9 %  sodium chloride  infusion   Intravenous Continuous Sonja Woodward, MD       dexamethasone  (DECADRON ) injection 10 mg  10 mg Intravenous Once Sonja Fred[place], MD       diphenhydrAMINE  (BENADRYL ) injection 50 mg  50 mg Intravenous Once Sonja Soham, MD       famotidine  (PEPCID ) IVPB 20 mg premix  20 mg Intravenous Once Sonja [place], MD       heparin  lock flush 100 unit/mL  500 Units Intracatheter Once PRN Sonja Gasquet, MD       PACLitaxel  (TAXOL ) 156 mg in sodium chloride  0.9 % 250 mL chemo infusion (</= 80mg /m2)  80 mg/m2 (Treatment Plan Recorded) Intravenous Once Sonja Derry, MD       sodium chloride  flush (NS) 0.9 % injection 10 mL  10 mL Intracatheter PRN Sonja , MD        PHYSICAL EXAMINATION: ECOG PERFORMANCE STATUS: 1 - Symptomatic but completely ambulatory  Vitals:   02/11/24 0849  BP: 114/87  Pulse: 70  Resp: 20  Temp: 97.7 F (36.5 C)  SpO2: 100%   Wt  Readings from Last 3 Encounters:  02/11/24 183 lb 14.4 oz (83.4 kg)  02/04/24 182 lb (82.6 kg)  01/28/24 181 lb 14.4 oz (82.5 kg)     GENERAL:alert, no distress and comfortable SKIN: skin color, texture, turgor are normal, no rashes or significant lesions EYES: normal, Conjunctiva are pink and non-injected, sclera  clear NECK: supple, thyroid  normal size, non-tender, without nodularity LYMPH:  no palpable lymphadenopathy in the cervical, axillary  LUNGS: clear to auscultation and percussion with normal breathing effort HEART: regular rate & rhythm and no murmurs and no lower extremity edema ABDOMEN:abdomen soft, non-tender and normal bowel sounds Musculoskeletal:no cyanosis of digits and no clubbing  NEURO: alert & oriented x 3 with fluent speech, no focal motor/sensory deficits  Physical Exam    LABORATORY DATA:  I have reviewed the data as listed    Latest Ref Rng & Units 02/11/2024    8:21 AM 02/04/2024    7:35 AM 01/28/2024   11:15 AM  CBC  WBC 4.0 - 10.5 K/uL 3.7  5.1  6.0   Hemoglobin 12.0 - 15.0 g/dL 9.3  9.5  9.5   Hematocrit 36.0 - 46.0 % 28.0  29.4  28.5   Platelets 150 - 400 K/uL 216  251  284         Latest Ref Rng & Units 02/11/2024    8:21 AM 02/04/2024    7:35 AM 01/28/2024   11:15 AM  CMP  Glucose 70 - 99 mg/dL 161  98  92   BUN 8 - 23 mg/dL 7  10  7    Creatinine 0.44 - 1.00 mg/dL 0.96  0.45  4.09   Sodium 135 - 145 mmol/L 142  143  142   Potassium 3.5 - 5.1 mmol/L 3.2  3.4  3.6   Chloride 98 - 111 mmol/L 110  110  110   CO2 22 - 32 mmol/L 26  27  28    Calcium  8.9 - 10.3 mg/dL 9.9  81.1  91.4   Total Protein 6.5 - 8.1 g/dL 6.2  6.5  6.6   Total Bilirubin 0.0 - 1.2 mg/dL 0.4  0.3  0.3   Alkaline Phos 38 - 126 U/L 90  96  106   AST 15 - 41 U/L 16  15  21    ALT 0 - 44 U/L 13  16  25        RADIOGRAPHIC STUDIES: I have personally reviewed the radiological images as listed and agreed with the findings in the report. No results found.    No orders of  the defined types were placed in this encounter.  All questions were answered. The patient knows to call the clinic with any problems, questions or concerns. No barriers to learning was detected. The total time spent in the appointment was 25 minutes.     Sonja [place], MD 02/11/2024

## 2024-02-11 NOTE — Patient Instructions (Signed)

## 2024-02-18 ENCOUNTER — Encounter: Payer: Self-pay | Admitting: Student

## 2024-02-18 ENCOUNTER — Inpatient Hospital Stay: Attending: Hematology

## 2024-02-18 ENCOUNTER — Inpatient Hospital Stay

## 2024-02-18 VITALS — BP 143/78 | HR 66 | Temp 98.3°F | Resp 16 | Wt 179.8 lb

## 2024-02-18 DIAGNOSIS — Z95828 Presence of other vascular implants and grafts: Secondary | ICD-10-CM

## 2024-02-18 DIAGNOSIS — C50912 Malignant neoplasm of unspecified site of left female breast: Secondary | ICD-10-CM

## 2024-02-18 DIAGNOSIS — Z5111 Encounter for antineoplastic chemotherapy: Secondary | ICD-10-CM | POA: Diagnosis not present

## 2024-02-18 DIAGNOSIS — F0394 Unspecified dementia, unspecified severity, with anxiety: Secondary | ICD-10-CM | POA: Insufficient documentation

## 2024-02-18 DIAGNOSIS — C773 Secondary and unspecified malignant neoplasm of axilla and upper limb lymph nodes: Secondary | ICD-10-CM | POA: Diagnosis not present

## 2024-02-18 DIAGNOSIS — Z170 Estrogen receptor positive status [ER+]: Secondary | ICD-10-CM | POA: Diagnosis not present

## 2024-02-18 DIAGNOSIS — Z1732 Human epidermal growth factor receptor 2 negative status: Secondary | ICD-10-CM | POA: Diagnosis not present

## 2024-02-18 DIAGNOSIS — C50412 Malignant neoplasm of upper-outer quadrant of left female breast: Secondary | ICD-10-CM | POA: Diagnosis not present

## 2024-02-18 DIAGNOSIS — Z1721 Progesterone receptor positive status: Secondary | ICD-10-CM | POA: Insufficient documentation

## 2024-02-18 LAB — CMP (CANCER CENTER ONLY)
ALT: 13 U/L (ref 0–44)
AST: 15 U/L (ref 15–41)
Albumin: 3.8 g/dL (ref 3.5–5.0)
Alkaline Phosphatase: 95 U/L (ref 38–126)
Anion gap: 5 (ref 5–15)
BUN: 7 mg/dL — ABNORMAL LOW (ref 8–23)
CO2: 25 mmol/L (ref 22–32)
Calcium: 10.1 mg/dL (ref 8.9–10.3)
Chloride: 110 mmol/L (ref 98–111)
Creatinine: 0.68 mg/dL (ref 0.44–1.00)
GFR, Estimated: >60 mL/min (ref >60)
Glucose, Bld: 124 mg/dL — ABNORMAL HIGH (ref 70–99)
Potassium: 4.1 mmol/L (ref 3.5–5.1)
Sodium: 140 mmol/L (ref 135–145)
Total Bilirubin: 0.3 mg/dL (ref 0.0–1.2)
Total Protein: 6.5 g/dL (ref 6.5–8.1)

## 2024-02-18 LAB — CBC WITH DIFFERENTIAL (CANCER CENTER ONLY)
Abs Immature Granulocytes: 0.06 10*3/uL (ref 0.00–0.07)
Basophils Absolute: 0 10*3/uL (ref 0.0–0.1)
Basophils Relative: 1 %
Eosinophils Absolute: 0.2 10*3/uL (ref 0.0–0.5)
Eosinophils Relative: 7 %
HCT: 28.9 % — ABNORMAL LOW (ref 36.0–46.0)
Hemoglobin: 9.6 g/dL — ABNORMAL LOW (ref 12.0–15.0)
Immature Granulocytes: 2 %
Lymphocytes Relative: 33 %
Lymphs Abs: 1 10*3/uL (ref 0.7–4.0)
MCH: 29.6 pg (ref 26.0–34.0)
MCHC: 33.2 g/dL (ref 30.0–36.0)
MCV: 89.2 fL (ref 80.0–100.0)
Monocytes Absolute: 0.3 10*3/uL (ref 0.1–1.0)
Monocytes Relative: 9 %
Neutro Abs: 1.4 10*3/uL — ABNORMAL LOW (ref 1.7–7.7)
Neutrophils Relative %: 48 %
Platelet Count: 228 10*3/uL (ref 150–400)
RBC: 3.24 MIL/uL — ABNORMAL LOW (ref 3.87–5.11)
RDW: 16.7 % — ABNORMAL HIGH (ref 11.5–15.5)
WBC Count: 3.1 10*3/uL — ABNORMAL LOW (ref 4.0–10.5)
nRBC: 0 % (ref 0.0–0.2)

## 2024-02-18 MED ORDER — SODIUM CHLORIDE 0.9% FLUSH
10.0000 mL | Freq: Once | INTRAVENOUS | Status: AC
  Administered 2024-02-18: 10 mL

## 2024-02-18 MED ORDER — SODIUM CHLORIDE 0.9 % IV SOLN
80.0000 mg/m2 | Freq: Once | INTRAVENOUS | Status: AC
  Administered 2024-02-18: 156 mg via INTRAVENOUS
  Filled 2024-02-18: qty 26

## 2024-02-18 MED ORDER — DEXAMETHASONE SODIUM PHOSPHATE 10 MG/ML IJ SOLN
10.0000 mg | Freq: Once | INTRAMUSCULAR | Status: AC
  Administered 2024-02-18: 10 mg via INTRAVENOUS
  Filled 2024-02-18: qty 1

## 2024-02-18 MED ORDER — SODIUM CHLORIDE 0.9 % IV SOLN: INTRAVENOUS | Status: DC

## 2024-02-18 MED ORDER — FAMOTIDINE IN NACL 20-0.9 MG/50ML-% IV SOLN
20.0000 mg | Freq: Once | INTRAVENOUS | Status: AC
  Administered 2024-02-18: 20 mg via INTRAVENOUS
  Filled 2024-02-18: qty 50

## 2024-02-18 MED ORDER — DIPHENHYDRAMINE HCL 50 MG/ML IJ SOLN
50.0000 mg | Freq: Once | INTRAMUSCULAR | Status: AC
  Administered 2024-02-18: 50 mg via INTRAVENOUS
  Filled 2024-02-18: qty 1

## 2024-02-18 NOTE — Patient Instructions (Signed)

## 2024-02-19 ENCOUNTER — Other Ambulatory Visit: Payer: Self-pay | Admitting: Student

## 2024-02-19 MED ORDER — METOPROLOL TARTRATE 50 MG PO TABS: 50.0000 mg | ORAL_TABLET | Freq: Two times a day (BID) | ORAL | 2 refills | Status: DC

## 2024-02-24 NOTE — Progress Notes (Unsigned)
 [facility]     Telephone:([phone]) [phone] Fax:([phone]    Patient Care Team: Glenn Lange, DO as PCP - General (Family Medicine) Avanell Leigh, MD as PCP - Cardiology (Cardiology) Brian Campanile, MD (Inactive) as Consulting Physician (Neurology) Sonja Moccasin, MD as Consulting Physician (Hematology and Oncology)   CHIEF COMPLAINT: Follow up left breast cancer   Oncology History  Infiltrating ductal carcinoma of breast (HCC)  09/22/2023 Initial Diagnosis   Infiltrating ductal carcinoma of breast (HCC)   11/12/2023 -  Chemotherapy   Patient is on Treatment Plan : BREAST DOSE DENSE AC q14d / PACLitaxel  q7d     Malignant neoplasm of upper-outer quadrant of left breast in female, estrogen receptor positive (HCC)  09/28/2023 Initial Diagnosis   Malignant neoplasm of upper-outer quadrant of left breast in female, estrogen receptor positive (HCC)   09/28/2023 Cancer Staging   Staging form: Breast, AJCC 8th Edition - Clinical stage from 09/28/2023: Stage IIA (cT2, cN1, cM0, G2, ER+, PR+, HER2-) - Signed by Sonja Washington Park, MD on 09/28/2023 Histologic grading system: 3 grade system   10/04/2023 Cancer Staging   Staging form: Breast, AJCC 8th Edition - Pathologic stage from 10/04/2023: Stage IIIA (pT2, pN3a, cM0, G2, ER+, PR+, HER2-) - Signed by Sonja , MD on 10/19/2023 Histologic grading system: 3 grade system Residual tumor (R): R0 - None   10/31/2023 Imaging   NM whole body bone scan IMPRESSION: No evidence skeletal metastasis.   10/31/2023 Imaging   CT chest abdomen and pelvis with contrast  IMPRESSION: 1. Status post lumpectomy of the upper outer left breast and left axillary lymph node dissection with associated simple attenuation fluid collections, most consistent with postoperative seromas. 2. Enlarged left axillary lymph node measuring 1.0 x 0.8 cm, nonspecific and possibly reactive in the setting of recent surgery. Correlate with tissue findings of  lymph node dissection. Attention on follow-up. 3. No other evidence of lymphadenopathy or metastatic disease in the chest, abdomen, or pelvis. 4. Nonobstructive bilateral nephrolithiasis.   11/08/2023 Echocardiogram   Echocardiogram FINDINGS   Left Ventricle: Left ventricular ejection fraction, by estimation, is 55 to 60%. Left ventricular ejection fraction by 3D volume is 56 %. The left ventricle has normal function. The left ventricle has no regional wall motion abnormalities. Global longitudinal strain performed but not reported based on interpreter judgement due to suboptimal tracking. The left ventricular internal cavity size was mildly dilated. There is mild left ventricular hypertrophy. Left ventricular diastolic parameters are consistent with Grade I diastolic dysfunction (impaired relaxation). Indeterminate filling pressures.  Right Ventricle: The right ventricular size is normal. No increase in right ventricular wall thickness. Right ventricular systolic function is  low normal. There is normal pulmonary artery systolic pressure. The tricuspid regurgitant velocity is 2.12 m/s,  and with an assumed right atrial pressure of 3 mmHg, the estimated right ventricular systolic pressure is 21.0 mmHg.  Left Atrium: Left atrial size was normal in size.  Right Atrium: Right atrial size was normal in size.  Pericardium: There is no evidence of pericardial effusion.  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 No evidence of mitral valve stenosis.  Tricuspid Valve: The tricuspid valve is normal in structure. Tricuspid valve regurgitation is mild . No evidence of tricuspid stenosis.  Aortic Valve: The aortic valve is tricuspid. Aortic valve regurgitation is not visualized. No aortic stenosis is present.  Pulmonic Valve: The pulmonic valve was normal in structure. Pulmonic valve  regurgitation is mild. No evidence of pulmonic stenosis.  Aorta: The aortic root and  ascending aorta are structurally normal, with no evidence of dilitation.  Venous: The inferior vena cava is normal in size with greater than 50% respiratory variability, suggesting right atrial pressure of 3 mmHg.  IAS/Shunts: There is redundancy of the interatrial septum. Cannot exclude a small PFO.        CURRENT THERAPY: Adjuvant chemo AC-T starting 11/12/23  INTERVAL HISTORY Ms. Quiles returns for follow up and treatment as scheduled. Last seen by Dr. Maryalice Smaller 02/11/24. She continues weekly Taxol , tolerating well overall.  All food tastes the same and metallic but she is still eating and drinking.  Mild nausea is managed with medication, no vomiting.  Bowels moving well.  Palms are dark, denies neuropathy.  She feels tired, living with her sister, up out of bed and able to do light housework and short walks but not motivated to do much else.  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   Hyperlipidemia    Hyperparathyroidism (HCC) 07/25/2019   Hypertension    Normal cardiac stress test    Myoview stress test   OAB (overactive bladder) 09/01/2015   Osteopenia 05/2018   T score -1.1 FRAX 2.5%/ 0.1%   Subclinical hyperthyroidism 05/08/2007   Qualifier: Diagnosis of  By: Daneil Dunker MD, [facility]       Past Surgical History:  Procedure Laterality Date   BREAST BIOPSY Left 09/19/2023   x's 2   BREAST BIOPSY Left 09/19/2023   US  LT BREAST BX W LOC DEV 1ST LESION IMG BX SPEC US  GUIDE 09/19/2023 [facility]   BREAST BIOPSY Left 10/03/2023   US  LT RADIOACTIVE SEED  LOC 10/03/2023 [facility]   BREAST BIOPSY Left 10/03/2023   US  LT RADIOACTIVE SEED EA ADD LESION 10/03/2023 [facility]   BREAST LUMPECTOMY WITH RADIOACTIVE SEED AND AXILLARY LYMPH NODE DISSECTION Left 10/04/2023   Procedure: LEFT BREAST LUMPECTOMY AND TARGETED LYMPH NODE DISSECTION;  Surgeon: Oza Blumenthal, MD;  Location:  SURGERY CENTER;  Service: General;  Laterality: Left;   COLONOSCOPY     IR IMAGING GUIDED PORT INSERTION  11/05/2023   POLYPECTOMY     TOTAL HIP ARTHROPLASTY Left 10/01/2013   DR ROWAN   TOTAL HIP ARTHROPLASTY Left 10/01/2013   Procedure: TOTAL HIP ARTHROPLASTY;  Surgeon: Ilean Mall, MD;  Location: [facility] OR;  Service: Orthopedics;  Laterality: Left;     Outpatient Encounter Medications as of 02/25/2024  Medication Sig Note   albuterol  (VENTOLIN  HFA) 108 (90 Base) MCG/ACT inhaler USE 2 INHALATIONS BY MOUTH EVERY 6 HOURS AS NEEDED FOR WHEEZING  OR SHORTNESS OF BREATH (Patient taking differently: Inhale 2 puffs into the lungs every 6 (six) hours as needed for wheezing or shortness of breath.) 11/05/2023: None in  a week    amLODipine  (NORVASC ) 10 MG tablet TAKE 1 TABLET BY MOUTH EVERY  MORNING    aspirin   EC 81 MG tablet Take 81 mg by mouth daily. Swallow whole.    Budesonide  90 MCG/ACT inhaler Inhale 1 puff into the lungs 2 (two) times daily.    capsaicin  (ZOSTRIX) 0.025 % cream Apply topically 2 (two) times daily.    cholecalciferol (VITAMIN D3) 25 MCG (1000 UNIT) tablet Take 1 tablet (1,000 Units total) by mouth daily.    Cholecalciferol (VITAMIN D3) 50 MCG (2000 UT) CHEW Chew 2,000 Units by mouth daily.    donepezil (ARICEPT) 10 MG tablet Take 10 mg by mouth at bedtime.    fluticasone  (FLONASE ) 50 MCG/ACT nasal spray USE 1 SPRAY IN BOTH NOSTRILS  DAILY (Patient taking differently: Place 1 spray into both nostrils daily as needed for allergies or rhinitis.)    fluticasone  (FLOVENT  HFA) 110 MCG/ACT inhaler Inhale 1 puff into the lungs daily as  needed. (Patient taking differently: Inhale 1 puff into the lungs daily as needed ("for flares").)    gabapentin  (NEURONTIN ) 100 MG capsule TAKE 1 CAPSULE BY MOUTH DAILY    lidocaine -prilocaine  (EMLA ) cream Apply to affected area once    loratadine  (EQ ALLERGY RELIEF) 10 MG tablet Take 1 tablet (10 mg total) by mouth daily. (Patient taking differently: Take 10 mg by mouth daily as needed for allergies or rhinitis.)    losartan  (COZAAR ) 50 MG tablet Take 1 tablet (50 mg total) by mouth at bedtime.    metoprolol  tartrate (LOPRESSOR ) 50 MG tablet Take 1 tablet (50 mg total) by mouth 2 (two) times daily.    ondansetron  (ZOFRAN ) 8 MG tablet Take 1 tab (8 mg) by mouth every 8 hrs as needed for nausea/vomiting. Start third day after doxorubicin /cyclophosphamide  chemotherapy.    ondansetron  (ZOFRAN -ODT) 4 MG disintegrating tablet Take 4 mg by mouth every 8 (eight) hours as needed for nausea or vomiting (dissolve orally).    potassium chloride  SA (KLOR-CON  M) 20 MEQ tablet Take 1 tablet (20 mEq total) by mouth 2 (two) times daily.    prochlorperazine  (COMPAZINE ) 10 MG tablet Take 1 tablet (10 mg total) by mouth every 6 (six) hours as needed for nausea or vomiting.    rosuvastatin  (CRESTOR ) 5 MG tablet Take one tablet by mouth every Monday and Friday. (Patient taking differently: Take 5 mg by mouth See admin instructions. Take 5 mg by mouth every Monday and Friday)    sertraline  (ZOLOFT ) 50 MG tablet TAKE 1 TABLET BY MOUTH DAILY    traMADol  (ULTRAM ) 50 MG tablet Take 1 tablet (50 mg total) by mouth every 6 (six) hours as needed for moderate pain (pain score 4-6) or severe pain (pain score 7-10).    TYLENOL  500 MG tablet Take 500-1,000 mg by mouth every 6 (six) hours as needed for mild pain (pain score 1-3) or headache.    No facility-administered encounter medications on file as of 02/25/2024.     Today's Vitals   02/25/24 0840 02/25/24 0845  BP: 122/81   Pulse: 77   Resp: 14   Temp: (!) 97.2 F (36.2  C)   TempSrc: Temporal   SpO2: 100%   Weight: 178 lb (80.7 kg)   PainSc:  0-No pain   Body mass index is 32.56 kg/m.   ECOG PERFORMANCE STATUS: 1 - Symptomatic but completely ambulatory  PHYSICAL EXAM GENERAL:alert, no distress and comfortable SKIN: no rash.  Palms with hyperpigmentation EYES: sclera clear NECK: without mass LUNGS: clear with normal breathing effort HEART: regular rate & rhythm, no lower extremity edema ABDOMEN: abdomen soft, non-tender and normal bowel  sounds NEURO: alert & oriented x 3 with fluent speech, no focal motor/sensory deficits Breast exam: Deferred PAC without erythema    CBC    Latest Ref Rng & Units 02/25/2024    8:21 AM 02/18/2024    7:58 AM 02/11/2024    8:21 AM  CBC  WBC 4.0 - 10.5 K/uL 3.2  3.1  3.7   Hemoglobin 12.0 - 15.0 g/dL 19.1  9.6  9.3   Hematocrit 36.0 - 46.0 % 30.4  28.9  28.0   Platelets 150 - 400 K/uL 249  228  216       CMP     Latest Ref Rng & Units 02/18/2024    7:58 AM 02/11/2024    8:21 AM 02/04/2024    7:35 AM  CMP  Glucose 70 - 99 mg/dL 478  295  98   BUN 8 - 23 mg/dL 7  7  10    Creatinine 0.44 - 1.00 mg/dL 6.21  3.08  6.57   Sodium 135 - 145 mmol/L 140  142  143   Potassium 3.5 - 5.1 mmol/L 4.1  3.2  3.4   Chloride 98 - 111 mmol/L 110  110  110   CO2 22 - 32 mmol/L 25  26  27    Calcium  8.9 - 10.3 mg/dL 84.6  9.9  96.2   Total Protein 6.5 - 8.1 g/dL 6.5  6.2  6.5   Total Bilirubin 0.0 - 1.2 mg/dL 0.3  0.4  0.3   Alkaline Phos 38 - 126 U/L 95  90  96   AST 15 - 41 U/L 15  16  15    ALT 0 - 44 U/L 13  13  16        ASSESSMENT & PLAN: 68 year old female  Malignant neoplasm of upper-outer quadrant of left breast in female, estrogen receptor positive, pT3N3aM0, stage IIIA, ER+/PR+/HER2- -diagnosed in 09/2023. Patient presented with a palpable left breast mass, ultrasound showed multiple enlarged left axillary lymph node. -Due to her underlying dementia, she underwent upfront surgery on 10/04/2023.  Unfortunately  surgical path showed ALL +14 lymph nodes, with negative margins. -Due to high risk of recurrence due to large number of positive lymph node, the recommendation is for adjuvant chemotherapy, followed by adjuvant radiation, aromatase inhibitor and CDK4/6 inhibitor  -Staging CT and bone scan was negative for distant metastasis, except a 1cm left axillary node which is likely reactive to surgery, will f/u on CT in 6 months  -she started adjuvant chemo AC on 11/12/2023, and weekly Taxol  on 01/07/24.  -Ms. Warneke appears stable, s/p week 6 Taxol , tolerating well with fatigue and mild nausea.  Side effects are adequately managed with supportive care at home. We reviewed symptom management. She is able to recover and function with adequate performance status. -Answered questions about sexual health  -Labs reviewed, adequate to proceed with cycle 7 as planned -Return for C8 next week, then f/up and C9 in 2 weeks -Proceed to adjuvant radiation after completion of chemo    PLAN: -Labs reviewed -ANC 1.2, reviewed neutropenic precautions. Will discuss adding filgrastim if ANC < 1 -Proceed with C7 Taxol , no dose adjustments -Return for C8 next week, then f/up and C9 in 2 weeks   All questions were answered. The patient knows to call the clinic with any problems, questions or concerns. No barriers to learning were detected.   Olsen Mccutchan K Shakendra Griffeth, NP 02/25/2024

## 2024-02-25 ENCOUNTER — Inpatient Hospital Stay

## 2024-02-25 ENCOUNTER — Encounter: Payer: Self-pay | Admitting: Nurse Practitioner

## 2024-02-25 ENCOUNTER — Inpatient Hospital Stay (HOSPITAL_BASED_OUTPATIENT_CLINIC_OR_DEPARTMENT_OTHER): Admitting: Nurse Practitioner

## 2024-02-25 VITALS — BP 122/81 | HR 77 | Temp 97.2°F | Resp 14 | Wt 178.0 lb

## 2024-02-25 VITALS — BP 135/81 | HR 73 | Resp 14

## 2024-02-25 DIAGNOSIS — Z1721 Progesterone receptor positive status: Secondary | ICD-10-CM | POA: Diagnosis not present

## 2024-02-25 DIAGNOSIS — C50912 Malignant neoplasm of unspecified site of left female breast: Secondary | ICD-10-CM

## 2024-02-25 DIAGNOSIS — Z5111 Encounter for antineoplastic chemotherapy: Secondary | ICD-10-CM | POA: Diagnosis not present

## 2024-02-25 DIAGNOSIS — C773 Secondary and unspecified malignant neoplasm of axilla and upper limb lymph nodes: Secondary | ICD-10-CM | POA: Diagnosis not present

## 2024-02-25 DIAGNOSIS — C50412 Malignant neoplasm of upper-outer quadrant of left female breast: Secondary | ICD-10-CM

## 2024-02-25 DIAGNOSIS — Z170 Estrogen receptor positive status [ER+]: Secondary | ICD-10-CM

## 2024-02-25 DIAGNOSIS — Z1732 Human epidermal growth factor receptor 2 negative status: Secondary | ICD-10-CM | POA: Diagnosis not present

## 2024-02-25 DIAGNOSIS — Z95828 Presence of other vascular implants and grafts: Secondary | ICD-10-CM

## 2024-02-25 LAB — CMP (CANCER CENTER ONLY)
ALT: 14 U/L (ref 0–44)
AST: 17 U/L (ref 15–41)
Albumin: 3.9 g/dL (ref 3.5–5.0)
Alkaline Phosphatase: 97 U/L (ref 38–126)
Anion gap: 4 — ABNORMAL LOW (ref 5–15)
BUN: 7 mg/dL — ABNORMAL LOW (ref 8–23)
CO2: 25 mmol/L (ref 22–32)
Calcium: 10.3 mg/dL (ref 8.9–10.3)
Chloride: 112 mmol/L — ABNORMAL HIGH (ref 98–111)
Creatinine: 0.72 mg/dL (ref 0.44–1.00)
GFR, Estimated: >60 mL/min (ref >60)
Glucose, Bld: 85 mg/dL (ref 70–99)
Potassium: 4.7 mmol/L (ref 3.5–5.1)
Sodium: 141 mmol/L (ref 135–145)
Total Bilirubin: 0.3 mg/dL (ref 0.0–1.2)
Total Protein: 6.6 g/dL (ref 6.5–8.1)

## 2024-02-25 LAB — CBC WITH DIFFERENTIAL (CANCER CENTER ONLY)
Abs Immature Granulocytes: 0.05 10*3/uL (ref 0.00–0.07)
Basophils Absolute: 0 10*3/uL (ref 0.0–0.1)
Basophils Relative: 1 %
Eosinophils Absolute: 0.2 10*3/uL (ref 0.0–0.5)
Eosinophils Relative: 5 %
HCT: 30.4 % — ABNORMAL LOW (ref 36.0–46.0)
Hemoglobin: 10 g/dL — ABNORMAL LOW (ref 12.0–15.0)
Immature Granulocytes: 2 %
Lymphocytes Relative: 42 %
Lymphs Abs: 1.4 10*3/uL (ref 0.7–4.0)
MCH: 29.9 pg (ref 26.0–34.0)
MCHC: 32.9 g/dL (ref 30.0–36.0)
MCV: 90.7 fL (ref 80.0–100.0)
Monocytes Absolute: 0.3 10*3/uL (ref 0.1–1.0)
Monocytes Relative: 11 %
Neutro Abs: 1.2 10*3/uL — ABNORMAL LOW (ref 1.7–7.7)
Neutrophils Relative %: 39 %
Platelet Count: 249 10*3/uL (ref 150–400)
RBC: 3.35 MIL/uL — ABNORMAL LOW (ref 3.87–5.11)
RDW: 16.6 % — ABNORMAL HIGH (ref 11.5–15.5)
WBC Count: 3.2 10*3/uL — ABNORMAL LOW (ref 4.0–10.5)
nRBC: 0 % (ref 0.0–0.2)

## 2024-02-25 MED ORDER — DIPHENHYDRAMINE HCL 50 MG/ML IJ SOLN
50.0000 mg | Freq: Once | INTRAMUSCULAR | Status: AC
Start: 2024-02-25 — End: 2024-02-25
  Administered 2024-02-25: 50 mg via INTRAVENOUS
  Filled 2024-02-25: qty 1

## 2024-02-25 MED ORDER — SODIUM CHLORIDE 0.9% FLUSH
10.0000 mL | Freq: Once | INTRAVENOUS | Status: AC
  Administered 2024-02-25: 10 mL

## 2024-02-25 MED ORDER — SODIUM CHLORIDE 0.9 % IV SOLN
80.0000 mg/m2 | Freq: Once | INTRAVENOUS | Status: AC
  Administered 2024-02-25: 156 mg via INTRAVENOUS
  Filled 2024-02-25: qty 26

## 2024-02-25 MED ORDER — FAMOTIDINE IN NACL 20-0.9 MG/50ML-% IV SOLN
20.0000 mg | Freq: Once | INTRAVENOUS | Status: AC
  Administered 2024-02-25: 20 mg via INTRAVENOUS
  Filled 2024-02-25: qty 50

## 2024-02-25 MED ORDER — DEXAMETHASONE SODIUM PHOSPHATE 10 MG/ML IJ SOLN
10.0000 mg | Freq: Once | INTRAMUSCULAR | Status: AC
  Administered 2024-02-25: 10 mg via INTRAVENOUS
  Filled 2024-02-25: qty 1

## 2024-02-25 MED ORDER — SODIUM CHLORIDE 0.9 % IV SOLN: INTRAVENOUS | Status: DC

## 2024-02-25 NOTE — Patient Instructions (Signed)

## 2024-02-29 ENCOUNTER — Other Ambulatory Visit: Payer: Self-pay

## 2024-03-03 ENCOUNTER — Inpatient Hospital Stay

## 2024-03-03 ENCOUNTER — Other Ambulatory Visit

## 2024-03-03 ENCOUNTER — Ambulatory Visit: Admitting: Hematology

## 2024-03-03 ENCOUNTER — Encounter: Payer: Self-pay | Admitting: *Deleted

## 2024-03-03 VITALS — BP 139/76 | HR 73 | Temp 97.8°F | Resp 16 | Wt 181.2 lb

## 2024-03-03 DIAGNOSIS — C50912 Malignant neoplasm of unspecified site of left female breast: Secondary | ICD-10-CM

## 2024-03-03 DIAGNOSIS — Z170 Estrogen receptor positive status [ER+]: Secondary | ICD-10-CM | POA: Diagnosis not present

## 2024-03-03 DIAGNOSIS — C773 Secondary and unspecified malignant neoplasm of axilla and upper limb lymph nodes: Secondary | ICD-10-CM | POA: Diagnosis not present

## 2024-03-03 DIAGNOSIS — Z5111 Encounter for antineoplastic chemotherapy: Secondary | ICD-10-CM | POA: Diagnosis not present

## 2024-03-03 DIAGNOSIS — Z1732 Human epidermal growth factor receptor 2 negative status: Secondary | ICD-10-CM | POA: Diagnosis not present

## 2024-03-03 DIAGNOSIS — Z1721 Progesterone receptor positive status: Secondary | ICD-10-CM | POA: Diagnosis not present

## 2024-03-03 DIAGNOSIS — C50412 Malignant neoplasm of upper-outer quadrant of left female breast: Secondary | ICD-10-CM | POA: Diagnosis not present

## 2024-03-03 DIAGNOSIS — Z95828 Presence of other vascular implants and grafts: Secondary | ICD-10-CM

## 2024-03-03 LAB — CBC WITH DIFFERENTIAL (CANCER CENTER ONLY)
Abs Immature Granulocytes: 0.02 10*3/uL (ref 0.00–0.07)
Basophils Absolute: 0 10*3/uL (ref 0.0–0.1)
Basophils Relative: 1 %
Eosinophils Absolute: 0.2 10*3/uL (ref 0.0–0.5)
Eosinophils Relative: 5 %
HCT: 28.8 % — ABNORMAL LOW (ref 36.0–46.0)
Hemoglobin: 9.4 g/dL — ABNORMAL LOW (ref 12.0–15.0)
Immature Granulocytes: 1 %
Lymphocytes Relative: 42 %
Lymphs Abs: 1.4 10*3/uL (ref 0.7–4.0)
MCH: 29.5 pg (ref 26.0–34.0)
MCHC: 32.6 g/dL (ref 30.0–36.0)
MCV: 90.3 fL (ref 80.0–100.0)
Monocytes Absolute: 0.4 10*3/uL (ref 0.1–1.0)
Monocytes Relative: 11 %
Neutro Abs: 1.3 10*3/uL — ABNORMAL LOW (ref 1.7–7.7)
Neutrophils Relative %: 40 %
Platelet Count: 223 10*3/uL (ref 150–400)
RBC: 3.19 MIL/uL — ABNORMAL LOW (ref 3.87–5.11)
RDW: 15.9 % — ABNORMAL HIGH (ref 11.5–15.5)
WBC Count: 3.3 10*3/uL — ABNORMAL LOW (ref 4.0–10.5)
nRBC: 0 % (ref 0.0–0.2)

## 2024-03-03 LAB — CMP (CANCER CENTER ONLY)
ALT: 12 U/L (ref 0–44)
AST: 15 U/L (ref 15–41)
Albumin: 3.7 g/dL (ref 3.5–5.0)
Alkaline Phosphatase: 81 U/L (ref 38–126)
Anion gap: 5 (ref 5–15)
BUN: 9 mg/dL (ref 8–23)
CO2: 25 mmol/L (ref 22–32)
Calcium: 9.9 mg/dL (ref 8.9–10.3)
Chloride: 110 mmol/L (ref 98–111)
Creatinine: 0.69 mg/dL (ref 0.44–1.00)
GFR, Estimated: >60 mL/min (ref >60)
Glucose, Bld: 86 mg/dL (ref 70–99)
Potassium: 4 mmol/L (ref 3.5–5.1)
Sodium: 140 mmol/L (ref 135–145)
Total Bilirubin: 0.3 mg/dL (ref 0.0–1.2)
Total Protein: 6.4 g/dL — ABNORMAL LOW (ref 6.5–8.1)

## 2024-03-03 MED ORDER — HEPARIN SOD (PORK) LOCK FLUSH 100 UNIT/ML IV SOLN
500.0000 [IU] | Freq: Once | INTRAVENOUS | Status: AC | PRN
Start: 2024-03-03 — End: 2024-03-03
  Administered 2024-03-03: 500 [IU]

## 2024-03-03 MED ORDER — SODIUM CHLORIDE 0.9 % IV SOLN
INTRAVENOUS | Status: DC
Start: 2024-03-03 — End: 2024-03-03

## 2024-03-03 MED ORDER — FAMOTIDINE IN NACL 20-0.9 MG/50ML-% IV SOLN
20.0000 mg | Freq: Once | INTRAVENOUS | Status: AC
Start: 2024-03-03 — End: 2024-03-03
  Administered 2024-03-03: 20 mg via INTRAVENOUS
  Filled 2024-03-03: qty 50

## 2024-03-03 MED ORDER — SODIUM CHLORIDE 0.9% FLUSH
10.0000 mL | INTRAVENOUS | Status: DC | PRN
  Administered 2024-03-03: 10 mL

## 2024-03-03 MED ORDER — SODIUM CHLORIDE 0.9% FLUSH
10.0000 mL | Freq: Once | INTRAVENOUS | Status: AC
  Administered 2024-03-03: 10 mL

## 2024-03-03 MED ORDER — DEXAMETHASONE SODIUM PHOSPHATE 10 MG/ML IJ SOLN
10.0000 mg | Freq: Once | INTRAMUSCULAR | Status: AC
  Administered 2024-03-03: 10 mg via INTRAVENOUS
  Filled 2024-03-03: qty 1

## 2024-03-03 MED ORDER — DIPHENHYDRAMINE HCL 50 MG/ML IJ SOLN
50.0000 mg | Freq: Once | INTRAMUSCULAR | Status: AC
  Administered 2024-03-03: 50 mg via INTRAVENOUS
  Filled 2024-03-03: qty 1

## 2024-03-03 MED ORDER — SODIUM CHLORIDE 0.9 % IV SOLN
80.0000 mg/m2 | Freq: Once | INTRAVENOUS | Status: AC
  Administered 2024-03-03: 156 mg via INTRAVENOUS
  Filled 2024-03-03: qty 26

## 2024-03-03 NOTE — Patient Instructions (Signed)
 [facility] - A DEPT OF [facility]  Discharge Instructions: Thank you for choosing [facility] to provide your oncology and hematology care.   If you have a lab appointment with the Cancer Center, please go directly to the Cancer Center and check in at the registration area.   Wear comfortable clothing and clothing appropriate for easy access to any Portacath or PICC line.   We strive to give you quality time with your provider. You may need to reschedule your appointment if you arrive late (15 or more minutes).  Arriving late affects you and other patients whose appointments are after yours.  Also, if you miss three or more appointments without notifying the office, you may be dismissed from the clinic at the provider's discretion.      For prescription refill requests, have your pharmacy contact our office and allow 72 hours for refills to be completed.    Today you received the following chemotherapy and/or immunotherapy agents paclitaxel      To help prevent nausea and vomiting after your treatment, we encourage you to take your nausea medication as directed.  BELOW ARE SYMPTOMS THAT SHOULD BE REPORTED IMMEDIATELY: *FEVER GREATER THAN 100.4 F (38 C) OR HIGHER *CHILLS OR SWEATING *NAUSEA AND VOMITING THAT IS NOT CONTROLLED WITH YOUR NAUSEA MEDICATION *UNUSUAL SHORTNESS OF BREATH *UNUSUAL BRUISING OR BLEEDING *URINARY PROBLEMS (pain or burning when urinating, or frequent urination) *BOWEL PROBLEMS (unusual diarrhea, constipation, pain near the anus) TENDERNESS IN MOUTH AND THROAT WITH OR WITHOUT PRESENCE OF ULCERS (sore throat, sores in mouth, or a toothache) UNUSUAL RASH, SWELLING OR PAIN  UNUSUAL VAGINAL DISCHARGE OR ITCHING   Items with * indicate a potential emergency and should be followed up as soon as possible or go to the Emergency Department if any problems should occur.  Please show the CHEMOTHERAPY ALERT CARD or IMMUNOTHERAPY  ALERT CARD at check-in to the Emergency Department and triage nurse.  Should you have questions after your visit or need to cancel or reschedule your appointment, please contact [facility] - A DEPT OF [facility]  Dept: [phone]  and follow the prompts.  Office hours are 8:00 a.m. to 4:30 p.m. Monday - Friday. Please note that voicemails left after 4:00 p.m. may not be returned until the following business day.  We are closed weekends and major holidays. You have access to a nurse at all times for urgent questions. Please call the main number to the clinic Dept: [phone] and follow the prompts.   For any non-urgent questions, you may also contact your provider using MyChart. We now offer e-Visits for anyone 38 and older to request care online for non-urgent symptoms. For details visit [email or web address].   Also download the MyChart app! Go to the app store, search "MyChart", open the app, select [place], and log in with your MyChart username and password.

## 2024-03-10 NOTE — Assessment & Plan Note (Signed)
 pT3N3aM0, stage IIIA, ER+/PR+/HER2- -diagnosed in 09/2023. Patient presented with a palpable left breast mass, ultrasound showed multiple enlarged left axillary lymph node. -Due to her underlying dementia, she underwent upfront surgery on 10/04/2023.  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 -Due to high risk of recurrence due to large number of positive lymph node, the recommendation is for adjuvant chemotherapy, followed by adjuvant radiation, aromatase inhibitor and CDK4/6 inhibitor  -Staging CT and bone scan was negative for distant metastasis, except a 1cm left axillary node which is likely reactive to surgery, will f/u on CT in 6 months  -she started adjuvant chemo AC on 11/12/2023, and weekly Taxol  on 01/07/24.  -Ms. Yearsley appears stable, s/p week 9 Taxol , tolerating well with fatigue and mild nausea.  Side effects are adequately managed with supportive care at home. We reviewed symptom management. She is able to recover and function with adequate performance status. -increased gabapentin  dosing to 200 mg daily.  -proceed with taxol  today.  --labs/flush and treatment in 1 week.  --labs/flush, follow up, and scheduled treatment in 2 weeks -Plan for adjuvant radiation after completion of chemo --referral placed to radiation oncology 03/11/2024.

## 2024-03-10 NOTE — Progress Notes (Unsigned)
 Patient Care Team: Glenn Lange, DO as PCP - General (Family Medicine) Avanell Leigh, MD as PCP - Cardiology (Cardiology) Brian Campanile, MD (Inactive) as Consulting Physician (Neurology) Sonja [place], MD as Consulting Physician (Hematology and Oncology)  Clinic Day:  03/11/2024  Referring physician: Glenn Lange, DO  ASSESSMENT & PLAN:   Assessment & Plan: Malignant neoplasm of upper-outer quadrant of left breast in female, estrogen receptor positive (HCC) pT3N3aM0, stage IIIA, ER+/PR+/HER2- -diagnosed in 09/2023. Patient presented with a palpable left breast mass, ultrasound showed multiple enlarged left axillary lymph node. -Due to her underlying dementia, she underwent upfront surgery on 10/04/2023.  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 -Due to high risk of recurrence due to large number of positive lymph node, the recommendation is for adjuvant chemotherapy, followed by adjuvant radiation, aromatase inhibitor and CDK4/6 inhibitor  -Staging CT and bone scan was negative for distant metastasis, except a 1cm left axillary node which is likely reactive to surgery, will f/u on CT in 6 months  -she started adjuvant chemo AC on 11/12/2023, and weekly Taxol  on 01/07/24.  -Ms. Chretien appears stable, s/p week 9 Taxol , tolerating well with fatigue and mild nausea.  Side effects are adequately managed with supportive care at home. We reviewed symptom management. She is able to recover and function with adequate performance status. -increased gabapentin  dosing to 200 mg daily.  -proceed with taxol  today.  --labs/flush and treatment in 1 week.  --labs/flush, follow up, and scheduled treatment in 2 weeks -Plan for adjuvant radiation after completion of chemo --referral placed to radiation oncology 03/11/2024.    Peripheral neuropathy Patient is on gabapentin  100 mg daily.  She started this for other issues prior to being treated for breast cancer.   States that neuropathy in her hands and feet is slightly worse, though manageable.  Increase gabapentin  dose to 200 mg daily.  Advise she begin higher dose when at home, not needing to drive or work until she understands how increasing dose will affect her. New prescription sent to her pharmacy.   Plan:  Labs reviewed. -stable anemia with Hgb 9.4 and Hct 29.4. CMP unremarkable.  Refer to radiation oncology for consultation. Plan is to proceed with radiation after completion of chemotherapy. Last dose of weekly taxol  scheduled for 03/31/2024.  Proceed with taxol  today without dose adjustment.  Labs/treatment in 1 week.  Labs, follow up, and treatment in 2 weeks.   The patient understands the plans discussed today and is in agreement with them.  She knows to contact our office if she develops concerns prior to her next appointment.  I provided 25 minutes of face-to-face time during this encounter and > 50% was spent counseling as documented under my assessment and plan.    Sharyon Deis, NP  [facility] - A DEPT OF [facility] [address] [place] [address] Dept: [phone] Dept Fax: [phone]   Orders Placed This Encounter  Procedures   Ambulatory referral to Radiation Oncology    Referral Priority:   Routine    Referral Type:   Consultation    Referral Reason:   Specialty Services Required    Requested Specialty:   Radiation Oncology    Number of Visits Requested:   1      CHIEF COMPLAINT:  CC: Left breast cancer, estrogen receptor positive question kidney  Current Treatment: Adjuvant chemotherapy AC-T starting 11/12/2023.  INTERVAL HISTORY:  Kelseigh is here today for repeat clinical assessment. She was last seen by Lacie, NP on 02/25/2024. She presents for Cycle 9 Taxol  today, Cycle 14 chemotherapy. In general, she is tolerating well. Does report some worsening of peripheral neuropathy. States that she  currently takes gabapentin  at 100 mg, but was prescribed by neurologist for something different. She states that neuropathy is manageable and does not interfere with her regular activities. She denies chest pain, chest pressure, or shortness of breath. She denies headaches or visual disturbances. She denies abdominal pain, nausea, vomiting, or changes in bowel or bladder habits.   She denies fevers or chills. She denies pain. Her appetite is good. Her weight has increased 3 pounds over last 2 weeks.  I have reviewed the past medical history, past surgical history, social history and family history with the patient and they are unchanged from previous note.  ALLERGIES:  is allergic to [facility] [aprepitant], hydrochlorothiazide , hydrocodone , and simvastatin.  MEDICATIONS:  Current Outpatient Medications  Medication Sig Dispense Refill   albuterol  (VENTOLIN  HFA) 108 (90 Base) MCG/ACT inhaler USE 2 INHALATIONS BY MOUTH EVERY 6 HOURS AS NEEDED FOR WHEEZING  OR SHORTNESS OF BREATH (Patient taking differently: Inhale 2 puffs into the lungs every 6 (six) hours as needed for wheezing or shortness of breath.) 26.8 g 2   amLODipine  (NORVASC ) 10 MG tablet TAKE 1 TABLET BY MOUTH EVERY  MORNING 100 tablet 2   aspirin  EC 81 MG tablet Take 81 mg by mouth daily. Swallow whole.     Budesonide  90 MCG/ACT inhaler Inhale 1 puff into the lungs 2 (two) times daily. 1 each 0   capsaicin  (ZOSTRIX) 0.025 % cream Apply topically 2 (two) times daily. 60 g 0   cholecalciferol (VITAMIN D3) 25 MCG (1000 UNIT) tablet Take 1 tablet (1,000 Units total) by mouth daily. 7 tablet 0   Cholecalciferol (VITAMIN D3) 50 MCG (2000 UT) CHEW Chew 2,000 Units by mouth daily.     donepezil (ARICEPT) 10 MG tablet Take 10 mg by mouth at bedtime.     fluticasone  (FLONASE ) 50 MCG/ACT nasal spray USE 1 SPRAY IN BOTH NOSTRILS  DAILY (Patient taking differently: Place 1 spray into both nostrils daily as needed for allergies or rhinitis.) 16 g 0    fluticasone  (FLOVENT  HFA) 110 MCG/ACT inhaler Inhale 1 puff into the lungs daily as needed. (Patient taking differently: Inhale 1 puff into the lungs daily as needed ("for flares").) 1 each 12   lidocaine -prilocaine  (EMLA ) cream Apply to affected area once 30 g 3   loratadine  (EQ ALLERGY RELIEF) 10 MG tablet Take 1 tablet (10 mg total) by mouth daily. (Patient taking differently: Take 10 mg by mouth daily as needed for allergies or rhinitis.) 90 tablet 3   losartan  (COZAAR ) 50 MG tablet Take 1 tablet (50 mg total) by mouth at bedtime. 90 tablet 3   metoprolol  tartrate (LOPRESSOR ) 50 MG tablet Take 1 tablet (50 mg total) by mouth 2 (two) times daily. 200 tablet 2   ondansetron  (ZOFRAN ) 8 MG tablet Take 1 tab (8 mg) by mouth every 8 hrs as needed for nausea/vomiting. Start third day after doxorubicin /cyclophosphamide  chemotherapy. 30 tablet 1   ondansetron  (ZOFRAN -ODT) 4 MG disintegrating tablet Take 4 mg by mouth every 8 (eight) hours as needed for nausea or vomiting (dissolve orally).     potassium chloride  SA (KLOR-CON  M) 20 MEQ tablet Take 1 tablet (20 mEq total) by mouth 2 (two) times daily. 30 tablet 1   prochlorperazine  (COMPAZINE ) 10  MG tablet Take 1 tablet (10 mg total) by mouth every 6 (six) hours as needed for nausea or vomiting. 30 tablet 1   rosuvastatin  (CRESTOR ) 5 MG tablet Take one tablet by mouth every Monday and Friday. (Patient taking differently: Take 5 mg by mouth See admin instructions. Take 5 mg by mouth every Monday and Friday) 20 tablet 4   sertraline  (ZOLOFT ) 50 MG tablet TAKE 1 TABLET BY MOUTH DAILY 100 tablet 3   traMADol  (ULTRAM ) 50 MG tablet Take 1 tablet (50 mg total) by mouth every 6 (six) hours as needed for moderate pain (pain score 4-6) or severe pain (pain score 7-10). 25 tablet 0   TYLENOL  500 MG tablet Take 500-1,000 mg by mouth every 6 (six) hours as needed for mild pain (pain score 1-3) or headache.     gabapentin  (NEURONTIN ) 100 MG capsule Take 2 capsules (200 mg  total) by mouth daily. 180 capsule 3   No current facility-administered medications for this visit.   Facility-Administered Medications Ordered in Other Visits  Medication Dose Route Frequency Provider Last Rate Last Admin   0.9 %  sodium chloride  infusion   Intravenous Continuous Sonja [place], MD 10 mL/hr at 03/11/24 0918 New Bag at 03/11/24 0918   heparin  lock flush 100 unit/mL  500 Units Intracatheter Once PRN Sonja [place], MD       PACLitaxel  (TAXOL ) 156 mg in sodium chloride  0.9 % 250 mL chemo infusion (</= 80mg /m2)  80 mg/m2 (Treatment Plan Recorded) Intravenous Once Sonja [place], MD       sodium chloride  flush (NS) 0.9 % injection 10 mL  10 mL Intracatheter PRN Sonja [place], MD        HISTORY OF PRESENT ILLNESS:   Oncology History  Infiltrating ductal carcinoma of breast (HCC)  09/22/2023 Initial Diagnosis   Infiltrating ductal carcinoma of breast (HCC)   11/12/2023 -  Chemotherapy   Patient is on Treatment Plan : BREAST DOSE DENSE AC q14d / PACLitaxel  q7d     Malignant neoplasm of upper-outer quadrant of left breast in female, estrogen receptor positive (HCC)  09/28/2023 Initial Diagnosis   Malignant neoplasm of upper-outer quadrant of left breast in female, estrogen receptor positive (HCC)   09/28/2023 Cancer Staging   Staging form: Breast, AJCC 8th Edition - Clinical stage from 09/28/2023: Stage IIA (cT2, cN1, cM0, G2, ER+, PR+, HER2-) - Signed by Sonja [place], MD on 09/28/2023 Histologic grading system: 3 grade system   10/04/2023 Cancer Staging   Staging form: Breast, AJCC 8th Edition - Pathologic stage from 10/04/2023: Stage IIIA (pT2, pN3a, cM0, G2, ER+, PR+, HER2-) - Signed by Sonja [place], MD on 10/19/2023 Histologic grading system: 3 grade system Residual tumor (R): R0 - None   10/31/2023 Imaging   NM whole body bone scan IMPRESSION: No evidence skeletal metastasis.   10/31/2023 Imaging   CT chest abdomen and pelvis with contrast  IMPRESSION: 1. Status post lumpectomy of the  upper outer left breast and left axillary lymph node dissection with associated simple attenuation fluid collections, most consistent with postoperative seromas. 2. Enlarged left axillary lymph node measuring 1.0 x 0.8 cm, nonspecific and possibly reactive in the setting of recent surgery. Correlate with tissue findings of lymph node dissection. Attention on follow-up. 3. No other evidence of lymphadenopathy or metastatic disease in the chest, abdomen, or pelvis. 4. Nonobstructive bilateral nephrolithiasis.   11/08/2023 Echocardiogram   Echocardiogram FINDINGS   Left Ventricle: Left ventricular ejection fraction, by estimation, is 55 to 60%. Left ventricular  ejection fraction by 3D volume is 56 %. The left ventricle has normal function. The left ventricle has no regional wall motion abnormalities. Global longitudinal strain performed but not reported based on interpreter judgement due to suboptimal tracking. The left ventricular internal cavity size was mildly dilated. There is mild left ventricular hypertrophy. Left ventricular diastolic parameters are consistent with Grade I diastolic dysfunction (impaired relaxation). Indeterminate filling pressures.  Right Ventricle: The right ventricular size is normal. No increase in right ventricular wall thickness. Right ventricular systolic function is  low normal. There is normal pulmonary artery systolic pressure. The tricuspid regurgitant velocity is 2.12 m/s,  and with an assumed right atrial pressure of 3 mmHg, the estimated right ventricular systolic pressure is 21.0 mmHg.  Left Atrium: Left atrial size was normal in size.  Right Atrium: Right atrial size was normal in size.  Pericardium: There is no evidence of pericardial effusion.  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 No evidence of mitral valve stenosis.  Tricuspid Valve: The tricuspid valve is normal in structure. Tricuspid valve regurgitation is mild . No  evidence of tricuspid stenosis.  Aortic Valve: The aortic valve is tricuspid. Aortic valve regurgitation is not visualized. No aortic stenosis is present.  Pulmonic Valve: The pulmonic valve was normal in structure. Pulmonic valve regurgitation is mild. No evidence of pulmonic stenosis.  Aorta: The aortic root and ascending aorta are structurally normal, with no evidence of dilitation.  Venous: The inferior vena cava is normal in size with greater than 50% respiratory variability, suggesting right atrial pressure of 3 mmHg.  IAS/Shunts: There is redundancy of the interatrial septum. Cannot exclude a small PFO.         REVIEW OF SYSTEMS:   Constitutional: Denies fevers, chills or abnormal weight loss Eyes: Denies blurriness of vision Ears, nose, mouth, throat, and face: Denies mucositis or sore throat Respiratory: Denies cough, dyspnea or wheezes Cardiovascular: Denies palpitation, chest discomfort or lower extremity swelling Gastrointestinal:  Denies nausea, heartburn or change in bowel habits Skin: Denies abnormal skin rashes Lymphatics: Denies new lymphadenopathy or easy bruising Neurological:Denies numbness, tingling or new weaknesses. Does report some neuropathy in her fingers and toes.  Behavioral/Psych: Mood is stable, no new changes  All other systems were reviewed with the patient and are negative.   VITALS:   Today's Vitals   03/11/24 0837 03/11/24 0840  BP: 134/80   Pulse: 77   Resp: 20   Temp: (!) 97.3 F (36.3 C)   TempSrc: Temporal   SpO2: 96%   Weight: 184 lb 4.8 oz (83.6 kg)   Height: 5\' 2"  (1.575 m)   PainSc:  0-No pain   Body mass index is 33.71 kg/m.   Wt Readings from Last 3 Encounters:  03/11/24 184 lb 4.8 oz (83.6 kg)  03/03/24 181 lb 4 oz (82.2 kg)  02/25/24 178 lb (80.7 kg)    Body mass index is 33.71 kg/m.  Performance status (ECOG): 1 - Symptomatic but completely ambulatory  PHYSICAL EXAM:   GENERAL:alert, no distress and  comfortable SKIN: skin color, texture, turgor are normal, no rashes or significant lesions EYES: normal, Conjunctiva are pink and non-injected, sclera clear OROPHARYNX:no exudate, no erythema and lips, buccal mucosa, and tongue normal  NECK: supple, thyroid  normal size, non-tender, without nodularity LYMPH:  no palpable lymphadenopathy in the cervical, axillary or inguinal LUNGS: clear to auscultation and percussion with normal breathing effort HEART: regular rate & rhythm and no murmurs and no lower  extremity edema ABDOMEN:abdomen soft, non-tender and normal bowel sounds Musculoskeletal:no cyanosis of digits and no clubbing  NEURO: alert & oriented x 3 with fluent speech, no focal motor/sensory deficits  LABORATORY DATA:  I have reviewed the data as listed    Component Value Date/Time   NA 141 03/11/2024 0742   NA 141 12/29/2021 0953   K 4.1 03/11/2024 0742   CL 112 (H) 03/11/2024 0742   CO2 24 03/11/2024 0742   GLUCOSE 89 03/11/2024 0742   BUN 11 03/11/2024 0742   BUN 7 (L) 12/29/2021 0953   CREATININE 0.72 03/11/2024 0742   CREATININE 0.75 05/03/2015 1559   CALCIUM  9.8 03/11/2024 0742   PROT 6.3 (L) 03/11/2024 0742   PROT 6.9 11/30/2021 1040   ALBUMIN 3.7 03/11/2024 0742   ALBUMIN 4.2 11/30/2021 1040   AST 16 03/11/2024 0742   ALT 14 03/11/2024 0742   ALKPHOS 85 03/11/2024 0742   BILITOT 0.3 03/11/2024 0742   GFRNONAA >60 03/11/2024 0742   GFRNONAA >89 01/15/2014 1509   GFRAA 95 10/12/2020 0825   GFRAA >89 01/15/2014 1509    Lab Results  Component Value Date   WBC 4.2 03/11/2024   NEUTROABS 2.1 03/11/2024   HGB 9.4 (L) 03/11/2024   HCT 29.4 (L) 03/11/2024   MCV 92.2 03/11/2024   PLT 243 03/11/2024

## 2024-03-11 ENCOUNTER — Inpatient Hospital Stay

## 2024-03-11 ENCOUNTER — Telehealth: Payer: Self-pay | Admitting: Radiation Oncology

## 2024-03-11 ENCOUNTER — Encounter: Payer: Self-pay | Admitting: Nurse Practitioner

## 2024-03-11 ENCOUNTER — Other Ambulatory Visit: Payer: Self-pay

## 2024-03-11 ENCOUNTER — Inpatient Hospital Stay (HOSPITAL_BASED_OUTPATIENT_CLINIC_OR_DEPARTMENT_OTHER): Admitting: Nurse Practitioner

## 2024-03-11 VITALS — BP 134/80 | HR 77 | Temp 97.3°F | Resp 20 | Ht 62.0 in | Wt 184.3 lb

## 2024-03-11 DIAGNOSIS — M792 Neuralgia and neuritis, unspecified: Secondary | ICD-10-CM | POA: Diagnosis not present

## 2024-03-11 DIAGNOSIS — C773 Secondary and unspecified malignant neoplasm of axilla and upper limb lymph nodes: Secondary | ICD-10-CM | POA: Diagnosis not present

## 2024-03-11 DIAGNOSIS — C50912 Malignant neoplasm of unspecified site of left female breast: Secondary | ICD-10-CM

## 2024-03-11 DIAGNOSIS — C50412 Malignant neoplasm of upper-outer quadrant of left female breast: Secondary | ICD-10-CM | POA: Diagnosis not present

## 2024-03-11 DIAGNOSIS — Z1732 Human epidermal growth factor receptor 2 negative status: Secondary | ICD-10-CM | POA: Diagnosis not present

## 2024-03-11 DIAGNOSIS — Z5111 Encounter for antineoplastic chemotherapy: Secondary | ICD-10-CM | POA: Diagnosis not present

## 2024-03-11 DIAGNOSIS — Z95828 Presence of other vascular implants and grafts: Secondary | ICD-10-CM

## 2024-03-11 DIAGNOSIS — Z170 Estrogen receptor positive status [ER+]: Secondary | ICD-10-CM

## 2024-03-11 DIAGNOSIS — Z1721 Progesterone receptor positive status: Secondary | ICD-10-CM | POA: Diagnosis not present

## 2024-03-11 LAB — CMP (CANCER CENTER ONLY)
ALT: 14 U/L (ref 0–44)
AST: 16 U/L (ref 15–41)
Albumin: 3.7 g/dL (ref 3.5–5.0)
Alkaline Phosphatase: 85 U/L (ref 38–126)
Anion gap: 5 (ref 5–15)
BUN: 11 mg/dL (ref 8–23)
CO2: 24 mmol/L (ref 22–32)
Calcium: 9.8 mg/dL (ref 8.9–10.3)
Chloride: 112 mmol/L — ABNORMAL HIGH (ref 98–111)
Creatinine: 0.72 mg/dL (ref 0.44–1.00)
GFR, Estimated: >60 mL/min (ref >60)
Glucose, Bld: 89 mg/dL (ref 70–99)
Potassium: 4.1 mmol/L (ref 3.5–5.1)
Sodium: 141 mmol/L (ref 135–145)
Total Bilirubin: 0.3 mg/dL (ref 0.0–1.2)
Total Protein: 6.3 g/dL — ABNORMAL LOW (ref 6.5–8.1)

## 2024-03-11 LAB — CBC WITH DIFFERENTIAL (CANCER CENTER ONLY)
Abs Immature Granulocytes: 0.03 10*3/uL (ref 0.00–0.07)
Basophils Absolute: 0.1 10*3/uL (ref 0.0–0.1)
Basophils Relative: 1 %
Eosinophils Absolute: 0.2 10*3/uL (ref 0.0–0.5)
Eosinophils Relative: 5 %
HCT: 29.4 % — ABNORMAL LOW (ref 36.0–46.0)
Hemoglobin: 9.4 g/dL — ABNORMAL LOW (ref 12.0–15.0)
Immature Granulocytes: 1 %
Lymphocytes Relative: 33 %
Lymphs Abs: 1.4 10*3/uL (ref 0.7–4.0)
MCH: 29.5 pg (ref 26.0–34.0)
MCHC: 32 g/dL (ref 30.0–36.0)
MCV: 92.2 fL (ref 80.0–100.0)
Monocytes Absolute: 0.5 10*3/uL (ref 0.1–1.0)
Monocytes Relative: 11 %
Neutro Abs: 2.1 10*3/uL (ref 1.7–7.7)
Neutrophils Relative %: 49 %
Platelet Count: 243 10*3/uL (ref 150–400)
RBC: 3.19 MIL/uL — ABNORMAL LOW (ref 3.87–5.11)
RDW: 15.7 % — ABNORMAL HIGH (ref 11.5–15.5)
Smear Review: NORMAL
WBC Count: 4.2 10*3/uL (ref 4.0–10.5)
nRBC: 0 % (ref 0.0–0.2)

## 2024-03-11 MED ORDER — SODIUM CHLORIDE 0.9% FLUSH
10.0000 mL | INTRAVENOUS | Status: DC | PRN
  Administered 2024-03-11: 10 mL

## 2024-03-11 MED ORDER — SODIUM CHLORIDE 0.9% FLUSH
10.0000 mL | Freq: Once | INTRAVENOUS | Status: AC
  Administered 2024-03-11: 10 mL

## 2024-03-11 MED ORDER — HEPARIN SOD (PORK) LOCK FLUSH 100 UNIT/ML IV SOLN
500.0000 [IU] | Freq: Once | INTRAVENOUS | Status: AC | PRN
  Administered 2024-03-11: 500 [IU]

## 2024-03-11 MED ORDER — SODIUM CHLORIDE 0.9 % IV SOLN
INTRAVENOUS | Status: DC
Start: 2024-03-11 — End: 2024-03-11

## 2024-03-11 MED ORDER — SODIUM CHLORIDE 0.9 % IV SOLN
80.0000 mg/m2 | Freq: Once | INTRAVENOUS | Status: AC
  Administered 2024-03-11: 156 mg via INTRAVENOUS
  Filled 2024-03-11: qty 26

## 2024-03-11 MED ORDER — FAMOTIDINE IN NACL 20-0.9 MG/50ML-% IV SOLN
20.0000 mg | Freq: Once | INTRAVENOUS | Status: AC
  Administered 2024-03-11: 20 mg via INTRAVENOUS
  Filled 2024-03-11: qty 50

## 2024-03-11 MED ORDER — DIPHENHYDRAMINE HCL 50 MG/ML IJ SOLN
50.0000 mg | Freq: Once | INTRAMUSCULAR | Status: AC
  Administered 2024-03-11: 50 mg via INTRAVENOUS
  Filled 2024-03-11: qty 1

## 2024-03-11 MED ORDER — DEXAMETHASONE SODIUM PHOSPHATE 10 MG/ML IJ SOLN
10.0000 mg | Freq: Once | INTRAMUSCULAR | Status: AC
  Administered 2024-03-11: 10 mg via INTRAVENOUS
  Filled 2024-03-11: qty 1

## 2024-03-11 MED ORDER — GABAPENTIN 100 MG PO CAPS
200.0000 mg | ORAL_CAPSULE | Freq: Every day | ORAL | 3 refills | Status: DC
Start: 2024-03-11 — End: 2024-03-17

## 2024-03-11 NOTE — Telephone Encounter (Signed)
 Called patient to schedule a FUN visit w. PA Richad Champagne. No answer, LVM, for a return call.

## 2024-03-11 NOTE — Patient Instructions (Signed)
 [facility] - A DEPT OF [facility]  Discharge Instructions: Thank you for choosing [facility] to provide your oncology and hematology care.   If you have a lab appointment with the Cancer Center, please go directly to the Cancer Center and check in at the registration area.   Wear comfortable clothing and clothing appropriate for easy access to any Portacath or PICC line.   We strive to give you quality time with your provider. You may need to reschedule your appointment if you arrive late (15 or more minutes).  Arriving late affects you and other patients whose appointments are after yours.  Also, if you miss three or more appointments without notifying the office, you may be dismissed from the clinic at the provider's discretion.      For prescription refill requests, have your pharmacy contact our office and allow 72 hours for refills to be completed.    Today you received the following chemotherapy and/or immunotherapy agents paclitaxel      To help prevent nausea and vomiting after your treatment, we encourage you to take your nausea medication as directed.  BELOW ARE SYMPTOMS THAT SHOULD BE REPORTED IMMEDIATELY: *FEVER GREATER THAN 100.4 F (38 C) OR HIGHER *CHILLS OR SWEATING *NAUSEA AND VOMITING THAT IS NOT CONTROLLED WITH YOUR NAUSEA MEDICATION *UNUSUAL SHORTNESS OF BREATH *UNUSUAL BRUISING OR BLEEDING *URINARY PROBLEMS (pain or burning when urinating, or frequent urination) *BOWEL PROBLEMS (unusual diarrhea, constipation, pain near the anus) TENDERNESS IN MOUTH AND THROAT WITH OR WITHOUT PRESENCE OF ULCERS (sore throat, sores in mouth, or a toothache) UNUSUAL RASH, SWELLING OR PAIN  UNUSUAL VAGINAL DISCHARGE OR ITCHING   Items with * indicate a potential emergency and should be followed up as soon as possible or go to the Emergency Department if any problems should occur.  Please show the CHEMOTHERAPY ALERT CARD or IMMUNOTHERAPY  ALERT CARD at check-in to the Emergency Department and triage nurse.  Should you have questions after your visit or need to cancel or reschedule your appointment, please contact [facility] - A DEPT OF [facility]  Dept: [phone]  and follow the prompts.  Office hours are 8:00 a.m. to 4:30 p.m. Monday - Friday. Please note that voicemails left after 4:00 p.m. may not be returned until the following business day.  We are closed weekends and major holidays. You have access to a nurse at all times for urgent questions. Please call the main number to the clinic Dept: [phone] and follow the prompts.   For any non-urgent questions, you may also contact your provider using MyChart. We now offer e-Visits for anyone 38 and older to request care online for non-urgent symptoms. For details visit [email or web address].   Also download the MyChart app! Go to the app store, search "MyChart", open the app, select [place], and log in with your MyChart username and password.

## 2024-03-16 ENCOUNTER — Other Ambulatory Visit: Payer: Self-pay | Admitting: Hematology

## 2024-03-17 ENCOUNTER — Encounter: Payer: Self-pay | Admitting: Nurse Practitioner

## 2024-03-17 ENCOUNTER — Other Ambulatory Visit

## 2024-03-17 ENCOUNTER — Inpatient Hospital Stay

## 2024-03-17 ENCOUNTER — Encounter: Payer: Self-pay | Admitting: Hematology

## 2024-03-17 ENCOUNTER — Ambulatory Visit: Admitting: Hematology

## 2024-03-17 ENCOUNTER — Inpatient Hospital Stay (HOSPITAL_BASED_OUTPATIENT_CLINIC_OR_DEPARTMENT_OTHER): Admitting: Nurse Practitioner

## 2024-03-17 ENCOUNTER — Telehealth: Payer: Self-pay | Admitting: Radiation Oncology

## 2024-03-17 ENCOUNTER — Inpatient Hospital Stay: Attending: Hematology

## 2024-03-17 VITALS — BP 161/84 | HR 75 | Temp 98.5°F | Resp 18

## 2024-03-17 DIAGNOSIS — C50912 Malignant neoplasm of unspecified site of left female breast: Secondary | ICD-10-CM

## 2024-03-17 DIAGNOSIS — Z1721 Progesterone receptor positive status: Secondary | ICD-10-CM | POA: Insufficient documentation

## 2024-03-17 DIAGNOSIS — C773 Secondary and unspecified malignant neoplasm of axilla and upper limb lymph nodes: Secondary | ICD-10-CM | POA: Diagnosis not present

## 2024-03-17 DIAGNOSIS — M792 Neuralgia and neuritis, unspecified: Secondary | ICD-10-CM

## 2024-03-17 DIAGNOSIS — G629 Polyneuropathy, unspecified: Secondary | ICD-10-CM | POA: Diagnosis not present

## 2024-03-17 DIAGNOSIS — Z5111 Encounter for antineoplastic chemotherapy: Secondary | ICD-10-CM | POA: Diagnosis not present

## 2024-03-17 DIAGNOSIS — C50412 Malignant neoplasm of upper-outer quadrant of left female breast: Secondary | ICD-10-CM

## 2024-03-17 DIAGNOSIS — Z170 Estrogen receptor positive status [ER+]: Secondary | ICD-10-CM

## 2024-03-17 DIAGNOSIS — Z95828 Presence of other vascular implants and grafts: Secondary | ICD-10-CM

## 2024-03-17 DIAGNOSIS — Z1732 Human epidermal growth factor receptor 2 negative status: Secondary | ICD-10-CM | POA: Insufficient documentation

## 2024-03-17 LAB — CBC WITH DIFFERENTIAL (CANCER CENTER ONLY)
Abs Immature Granulocytes: 0.03 10*3/uL (ref 0.00–0.07)
Basophils Absolute: 0.1 10*3/uL (ref 0.0–0.1)
Basophils Relative: 1 %
Eosinophils Absolute: 0.2 10*3/uL (ref 0.0–0.5)
Eosinophils Relative: 4 %
HCT: 30.2 % — ABNORMAL LOW (ref 36.0–46.0)
Hemoglobin: 9.8 g/dL — ABNORMAL LOW (ref 12.0–15.0)
Immature Granulocytes: 1 %
Lymphocytes Relative: 37 %
Lymphs Abs: 1.5 10*3/uL (ref 0.7–4.0)
MCH: 29.5 pg (ref 26.0–34.0)
MCHC: 32.5 g/dL (ref 30.0–36.0)
MCV: 91 fL (ref 80.0–100.0)
Monocytes Absolute: 0.3 10*3/uL (ref 0.1–1.0)
Monocytes Relative: 7 %
Neutro Abs: 2 10*3/uL (ref 1.7–7.7)
Neutrophils Relative %: 50 %
Platelet Count: 245 10*3/uL (ref 150–400)
RBC: 3.32 MIL/uL — ABNORMAL LOW (ref 3.87–5.11)
RDW: 15.3 % (ref 11.5–15.5)
WBC Count: 4 10*3/uL (ref 4.0–10.5)
nRBC: 0 % (ref 0.0–0.2)

## 2024-03-17 LAB — CMP (CANCER CENTER ONLY)
ALT: 14 U/L (ref 0–44)
AST: 16 U/L (ref 15–41)
Albumin: 3.9 g/dL (ref 3.5–5.0)
Alkaline Phosphatase: 85 U/L (ref 38–126)
Anion gap: 3 — ABNORMAL LOW (ref 5–15)
BUN: 7 mg/dL — ABNORMAL LOW (ref 8–23)
CO2: 26 mmol/L (ref 22–32)
Calcium: 10.1 mg/dL (ref 8.9–10.3)
Chloride: 112 mmol/L — ABNORMAL HIGH (ref 98–111)
Creatinine: 0.57 mg/dL (ref 0.44–1.00)
GFR, Estimated: >60 mL/min (ref >60)
Glucose, Bld: 91 mg/dL (ref 70–99)
Potassium: 3.7 mmol/L (ref 3.5–5.1)
Sodium: 141 mmol/L (ref 135–145)
Total Bilirubin: 0.3 mg/dL (ref 0.0–1.2)
Total Protein: 6.6 g/dL (ref 6.5–8.1)

## 2024-03-17 MED ORDER — SODIUM CHLORIDE 0.9 % IV SOLN: INTRAVENOUS | Status: DC

## 2024-03-17 MED ORDER — HEPARIN SOD (PORK) LOCK FLUSH 100 UNIT/ML IV SOLN
500.0000 [IU] | Freq: Once | INTRAVENOUS | Status: DC | PRN
Start: 2024-03-17 — End: 2024-03-17

## 2024-03-17 MED ORDER — SODIUM CHLORIDE 0.9% FLUSH
10.0000 mL | Freq: Once | INTRAVENOUS | Status: AC
  Administered 2024-03-17: 10 mL

## 2024-03-17 MED ORDER — DEXAMETHASONE SODIUM PHOSPHATE 10 MG/ML IJ SOLN
10.0000 mg | Freq: Once | INTRAMUSCULAR | Status: AC
  Administered 2024-03-17: 10 mg via INTRAVENOUS
  Filled 2024-03-17: qty 1

## 2024-03-17 MED ORDER — FAMOTIDINE IN NACL 20-0.9 MG/50ML-% IV SOLN
20.0000 mg | Freq: Once | INTRAVENOUS | Status: AC
  Administered 2024-03-17: 20 mg via INTRAVENOUS
  Filled 2024-03-17: qty 50

## 2024-03-17 MED ORDER — DIPHENHYDRAMINE HCL 50 MG/ML IJ SOLN
50.0000 mg | Freq: Once | INTRAMUSCULAR | Status: AC
  Administered 2024-03-17: 50 mg via INTRAVENOUS
  Filled 2024-03-17: qty 1

## 2024-03-17 MED ORDER — SODIUM CHLORIDE 0.9% FLUSH
10.0000 mL | INTRAVENOUS | Status: DC | PRN
Start: 2024-03-17 — End: 2024-03-17

## 2024-03-17 MED ORDER — GABAPENTIN 300 MG PO CAPS: 300.0000 mg | ORAL_CAPSULE | Freq: Every day | ORAL | 3 refills | Status: DC

## 2024-03-17 MED ORDER — SODIUM CHLORIDE 0.9 % IV SOLN
70.0000 mg/m2 | Freq: Once | INTRAVENOUS | Status: AC
  Administered 2024-03-17: 138 mg via INTRAVENOUS
  Filled 2024-03-17: qty 23

## 2024-03-17 NOTE — Telephone Encounter (Signed)
 Called patient to r/s FUN visit per provider's request. No answer, LVM for a return call.

## 2024-03-17 NOTE — Progress Notes (Signed)
 [facility]   Telephone:(336) [phone] Fax:(336) [phone]    Patient Care Team: Glenn Lange, DO as PCP - General (Family Medicine) Avanell Leigh, MD as PCP - Cardiology (Cardiology) Brian Campanile, MD (Inactive) as Consulting Physician (Neurology) Sonja Nora, MD as Consulting Physician (Hematology and Oncology)   CHIEF COMPLAINT: Follow up left breast cancer  Oncology History  Infiltrating ductal carcinoma of breast (HCC)  09/22/2023 Initial Diagnosis   Infiltrating ductal carcinoma of breast (HCC)   11/12/2023 -  Chemotherapy   Patient is on Treatment Plan : BREAST DOSE DENSE AC q14d / PACLitaxel  q7d     Malignant neoplasm of upper-outer quadrant of left breast in female, estrogen receptor positive (HCC)  09/28/2023 Initial Diagnosis   Malignant neoplasm of upper-outer quadrant of left breast in female, estrogen receptor positive (HCC)   09/28/2023 Cancer Staging   Staging form: Breast, AJCC 8th Edition - Clinical stage from 09/28/2023: Stage IIA (cT2, cN1, cM0, G2, ER+, PR+, HER2-) - Signed by Sonja Northumberland, MD on 09/28/2023 Histologic grading system: 3 grade system   10/04/2023 Cancer Staging   Staging form: Breast, AJCC 8th Edition - Pathologic stage from 10/04/2023: Stage IIIA (pT2, pN3a, cM0, G2, ER+, PR+, HER2-) - Signed by Sonja Clarksburg, MD on 10/19/2023 Histologic grading system: 3 grade system Residual tumor (R): R0 - None   10/31/2023 Imaging   NM whole body bone scan IMPRESSION: No evidence skeletal metastasis.   10/31/2023 Imaging   CT chest abdomen and pelvis with contrast  IMPRESSION: 1. Status post lumpectomy of the upper outer left breast and left axillary lymph node dissection with associated simple attenuation fluid collections, most consistent with postoperative seromas. 2. Enlarged left axillary lymph node measuring 1.0 x 0.8 cm, nonspecific and possibly reactive in the setting of recent surgery. Correlate with tissue findings of lymph  node dissection. Attention on follow-up. 3. No other evidence of lymphadenopathy or metastatic disease in the chest, abdomen, or pelvis. 4. Nonobstructive bilateral nephrolithiasis.   11/08/2023 Echocardiogram   Echocardiogram FINDINGS   Left Ventricle: Left ventricular ejection fraction, by estimation, is 55 to 60%. Left ventricular ejection fraction by 3D volume is 56 %. The left ventricle has normal function. The left ventricle has no regional wall motion abnormalities. Global longitudinal strain performed but not reported based on interpreter judgement due to suboptimal tracking. The left ventricular internal cavity size was mildly dilated. There is mild left ventricular hypertrophy. Left ventricular diastolic parameters are consistent with Grade I diastolic dysfunction (impaired relaxation). Indeterminate filling pressures.  Right Ventricle: The right ventricular size is normal. No increase in right ventricular wall thickness. Right ventricular systolic function is  low normal. There is normal pulmonary artery systolic pressure. The tricuspid regurgitant velocity is 2.12 m/s,  and with an assumed right atrial pressure of 3 mmHg, the estimated right ventricular systolic pressure is 21.0 mmHg.  Left Atrium: Left atrial size was normal in size.  Right Atrium: Right atrial size was normal in size.  Pericardium: There is no evidence of pericardial effusion.  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 No evidence of mitral valve stenosis.  Tricuspid Valve: The tricuspid valve is normal in structure. Tricuspid valve regurgitation is mild . No evidence of tricuspid stenosis.  Aortic Valve: The aortic valve is tricuspid. Aortic valve regurgitation is not visualized. No aortic stenosis is present.  Pulmonic Valve: The pulmonic valve was normal in structure. Pulmonic valve regurgitation is mild. No evidence  of pulmonic stenosis.  Aorta: The aortic root and ascending  aorta are structurally normal, with no evidence of dilitation.  Venous: The inferior vena cava is normal in size with greater than 50% respiratory variability, suggesting right atrial pressure of 3 mmHg.  IAS/Shunts: There is redundancy of the interatrial septum. Cannot exclude a small PFO.        CURRENT THERAPY:  Adjuvant chemo AC-T starting 11/12/23   INTERVAL HISTORY [name] returns for follow up and treatment. Continues weekly taxol . She didn't sleep well last night and was nauseous this morning, no vomiting. Eating/drinking well and remains active. Neuropathy has become bothersome and more persistent. Not dropping things but activities take more effort. She was taking Bcomplex but ran out.  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   Hyperlipidemia    Hyperparathyroidism (HCC) 07/25/2019   Hypertension    Normal cardiac stress test    Myoview stress test   OAB (overactive bladder) 09/01/2015   Osteopenia 05/2018   T score -1.1 FRAX 2.5%/ 0.1%   Subclinical hyperthyroidism 05/08/2007   Qualifier: Diagnosis of  By: Daneil Dunker MD, [facility]       Past Surgical History:  Procedure Laterality Date   BREAST BIOPSY Left 09/19/2023   x's 2   BREAST BIOPSY Left 09/19/2023   US  LT BREAST BX W LOC DEV 1ST LESION IMG BX SPEC US  GUIDE 09/19/2023 [facility]   BREAST BIOPSY Left 10/03/2023   US  LT RADIOACTIVE SEED LOC 10/03/2023 [facility]   BREAST BIOPSY Left 10/03/2023   US  LT RADIOACTIVE SEED EA ADD LESION 10/03/2023 [facility]   BREAST  LUMPECTOMY WITH RADIOACTIVE SEED AND AXILLARY LYMPH NODE DISSECTION Left 10/04/2023   Procedure: LEFT BREAST LUMPECTOMY AND TARGETED LYMPH NODE DISSECTION;  Surgeon: Oza Blumenthal, MD;  Location: [facility];  Service: General;  Laterality: Left;   COLONOSCOPY     IR IMAGING GUIDED PORT INSERTION  11/05/2023   POLYPECTOMY     TOTAL HIP ARTHROPLASTY Left 10/01/2013   DR ROWAN   TOTAL HIP ARTHROPLASTY Left 10/01/2013   Procedure: TOTAL HIP ARTHROPLASTY;  Surgeon: Ilean Mall, MD;  Location: [facility];  Service: Orthopedics;  Laterality: Left;     Outpatient Encounter Medications as of 03/17/2024  Medication Sig Note   albuterol  (VENTOLIN  HFA) 108 (90 Base) MCG/ACT inhaler USE 2 INHALATIONS BY MOUTH EVERY 6 HOURS AS NEEDED FOR WHEEZING  OR SHORTNESS OF BREATH (Patient taking differently: Inhale 2 puffs into the lungs every 6 (six) hours as needed for wheezing or shortness of breath.) 11/05/2023: None in  a week    amLODipine  (NORVASC ) 10 MG tablet TAKE 1 TABLET BY MOUTH EVERY  MORNING    aspirin  EC 81 MG tablet Take 81 mg by mouth daily. Swallow whole.    Budesonide  90 MCG/ACT inhaler Inhale 1 puff into the lungs 2 (two) times daily.    capsaicin  (ZOSTRIX) 0.025 % cream  Apply topically 2 (two) times daily.    cholecalciferol (VITAMIN D3) 25 MCG (1000 UNIT) tablet Take 1 tablet (1,000 Units total) by mouth daily.    Cholecalciferol (VITAMIN D3) 50 MCG (2000 UT) CHEW Chew 2,000 Units by mouth daily.    donepezil (ARICEPT) 10 MG tablet Take 10 mg by mouth at bedtime.    fluticasone  (FLONASE ) 50 MCG/ACT nasal spray USE 1 SPRAY IN BOTH NOSTRILS  DAILY (Patient taking differently: Place 1 spray into both nostrils daily as needed for allergies or rhinitis.)    fluticasone  (FLOVENT  HFA) 110 MCG/ACT inhaler Inhale 1 puff into the lungs daily as needed. (Patient taking differently: Inhale 1 puff into the lungs daily as needed ("for flares").)    gabapentin  (NEURONTIN ) 100 MG capsule Take 2  capsules (200 mg total) by mouth daily.    KLOR-CON  M20 20 MEQ tablet Take 1 tablet by mouth twice daily    lidocaine -prilocaine  (EMLA ) cream Apply to affected area once    loratadine  (EQ ALLERGY RELIEF) 10 MG tablet Take 1 tablet (10 mg total) by mouth daily. (Patient taking differently: Take 10 mg by mouth daily as needed for allergies or rhinitis.)    losartan  (COZAAR ) 50 MG tablet Take 1 tablet (50 mg total) by mouth at bedtime.    metoprolol  tartrate (LOPRESSOR ) 50 MG tablet Take 1 tablet (50 mg total) by mouth 2 (two) times daily.    ondansetron  (ZOFRAN ) 8 MG tablet Take 1 tab (8 mg) by mouth every 8 hrs as needed for nausea/vomiting. Start third day after doxorubicin /cyclophosphamide  chemotherapy.    ondansetron  (ZOFRAN -ODT) 4 MG disintegrating tablet Take 4 mg by mouth every 8 (eight) hours as needed for nausea or vomiting (dissolve orally).    prochlorperazine  (COMPAZINE ) 10 MG tablet Take 1 tablet (10 mg total) by mouth every 6 (six) hours as needed for nausea or vomiting.    rosuvastatin  (CRESTOR ) 5 MG tablet Take one tablet by mouth every Monday and Friday. (Patient taking differently: Take 5 mg by mouth See admin instructions. Take 5 mg by mouth every Monday and Friday)    sertraline  (ZOLOFT ) 50 MG tablet TAKE 1 TABLET BY MOUTH DAILY    traMADol  (ULTRAM ) 50 MG tablet Take 1 tablet (50 mg total) by mouth every 6 (six) hours as needed for moderate pain (pain score 4-6) or severe pain (pain score 7-10).    TYLENOL  500 MG tablet Take 500-1,000 mg by mouth every 6 (six) hours as needed for mild pain (pain score 1-3) or headache.    [DISCONTINUED] potassium chloride  SA (KLOR-CON  M) 20 MEQ tablet Take 1 tablet (20 mEq total) by mouth 2 (two) times daily.    Facility-Administered Encounter Medications as of 03/17/2024  Medication   0.9 %  sodium chloride  infusion   [COMPLETED] dexamethasone  (DECADRON ) injection 10 mg   [COMPLETED] diphenhydrAMINE  (BENADRYL ) injection 50 mg   famotidine   (PEPCID ) IVPB 20 mg premix   heparin  lock flush 100 unit/mL   PACLitaxel  (TAXOL ) 138 mg in sodium chloride  0.9 % 250 mL chemo infusion (</= 80mg /m2)   sodium chloride  flush (NS) 0.9 % injection 10 mL     There were no vitals filed for this visit. There is no height or weight on file to calculate BMI.   ECOG PERFORMANCE STATUS: 1 - Symptomatic but completely ambulatory  PHYSICAL EXAM GENERAL:alert, no distress and comfortable SKIN: no rash.  EYES: sclera clear LUNGS: clear with normal breathing effort HEART: regular rate & rhythm, no lower extremity edema ABDOMEN: abdomen  soft, non-tender and normal bowel sounds NEURO: alert & oriented x 3 with fluent speech, no focal motor/sensory deficits PAC without erythema    CBC    Latest Ref Rng & Units 03/17/2024   11:07 AM 03/11/2024    7:42 AM 03/03/2024    8:53 AM  CBC  WBC 4.0 - 10.5 K/uL 4.0  4.2  3.3   Hemoglobin 12.0 - 15.0 g/dL 9.8  9.4  9.4   Hematocrit 36.0 - 46.0 % 30.2  29.4  28.8   Platelets 150 - 400 K/uL 245  243  223       CMP     Latest Ref Rng & Units 03/17/2024   11:07 AM 03/11/2024    7:42 AM 03/03/2024    8:53 AM  CMP  Glucose 70 - 99 mg/dL 91  89  86   BUN 8 - 23 mg/dL 7  11  9    Creatinine 0.44 - 1.00 mg/dL 1.19  1.47  8.29   Sodium 135 - 145 mmol/L 141  141  140   Potassium 3.5 - 5.1 mmol/L 3.7  4.1  4.0   Chloride 98 - 111 mmol/L 112  112  110   CO2 22 - 32 mmol/L 26  24  25    Calcium  8.9 - 10.3 mg/dL 56.2  9.8  9.9   Total Protein 6.5 - 8.1 g/dL 6.6  6.3  6.4   Total Bilirubin 0.0 - 1.2 mg/dL 0.3  0.3  0.3   Alkaline Phos 38 - 126 U/L 85  85  81   AST 15 - 41 U/L 16  16  15    ALT 0 - 44 U/L 14  14  12        ASSESSMENT & PLAN:68 year old female   Malignant neoplasm of upper-outer quadrant of left breast in female, estrogen receptor positive, pT3N3aM0, stage IIIA, ER+/PR+/HER2- -diagnosed in 09/2023. Patient presented with a palpable left breast mass, ultrasound showed multiple enlarged left axillary  lymph node. -Due to her underlying dementia, she underwent upfront surgery on 10/04/2023.  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 -Due to high risk of recurrence due to large number of positive lymph node, the recommendation is for adjuvant chemotherapy, followed by adjuvant radiation, aromatase inhibitor and CDK4/6 inhibitor  -Staging CT and bone scan was negative for distant metastasis, except a 1cm left axillary node which is likely reactive to surgery, will f/u on CT in 6 months  -she started adjuvant chemo AC on 11/12/2023, and weekly Taxol  on 01/07/24.  -[name] appears stable, s/p cycle 9 weekly Taxol  at full dose, tolerating well overall. She may have some degree of anticipatory nausea, we reviewed management.  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 I recommend continue cryotherapy during taxol , restart B complex vitamin, increase gabapentin , and dose reduce taxol  to 70 mg/m2. If CIPN is worse next week, will discuss further dose reduction vs stopping chemo early -She is otherwise tolerating well with good PS. Labs adequate for treatment -Proceed with cycle 10 Taxol  today, return for f/up and last 2 cycles. She is scheduled to see rad onc soon -We will see her back towards the end of radiation to discuss anti-estrogen therapy      PLAN: -Labs reviewed -Proceed with cycle 10 Taxol  today -For CIPN: continue cryotherapy, restart B complex vit, increase gabapentin  300 mg at bedtime, dose reduce Taxol  to 70 mg/m2 -F/up and cycle 11 next week, consider further dose reduction vs hold chemo  if CIPN is worse -Treatment plan includes adjuvant radiation after chemo, then anti-estrogen therapy   All questions were answered. The patient knows to call the clinic with any problems, questions or concerns. No barriers to learning were detected. I spent 20 minutes counseling the patient face to face. The total time spent in the appointment was  30 minutes and more than 50% was on counseling, review of test results, and coordination of care.   Addam Goeller K Si Jachim, NP 03/17/2024

## 2024-03-17 NOTE — Progress Notes (Signed)
 Reduce Taxol  to 70mg /m2 for today and subsequent cycles d/t neuropathy per Lacie, NP.  Kately Graffam, PharmD, MBA

## 2024-03-17 NOTE — Patient Instructions (Signed)
 [facility] - A DEPT OF . [facility]  Discharge Instructions: Thank you for choosing [facility] to provide your oncology and hematology care.   If you have a lab appointment with the Cancer Center, please go directly to the Cancer Center and check in at the registration area.   Wear comfortable clothing and clothing appropriate for easy access to any Portacath or PICC line.   We strive to give you quality time with your provider. You may need to reschedule your appointment if you arrive late (15 or more minutes).  Arriving late affects you and other patients whose appointments are after yours.  Also, if you miss three or more appointments without notifying the office, you may be dismissed from the clinic at the provider's discretion.      For prescription refill requests, have your pharmacy contact our office and allow 72 hours for refills to be completed.    Today you received the following chemotherapy and/or immunotherapy agents taxol       To help prevent nausea and vomiting after your treatment, we encourage you to take your nausea medication as directed.  BELOW ARE SYMPTOMS THAT SHOULD BE REPORTED IMMEDIATELY: *FEVER GREATER THAN 100.4 F (38 C) OR HIGHER *CHILLS OR SWEATING *NAUSEA AND VOMITING THAT IS NOT CONTROLLED WITH YOUR NAUSEA MEDICATION *UNUSUAL SHORTNESS OF BREATH *UNUSUAL BRUISING OR BLEEDING *URINARY PROBLEMS (pain or burning when urinating, or frequent urination) *BOWEL PROBLEMS (unusual diarrhea, constipation, pain near the anus) TENDERNESS IN MOUTH AND THROAT WITH OR WITHOUT PRESENCE OF ULCERS (sore throat, sores in mouth, or a toothache) UNUSUAL RASH, SWELLING OR PAIN  UNUSUAL VAGINAL DISCHARGE OR ITCHING   Items with * indicate a potential emergency and should be followed up as soon as possible or go to the Emergency Department if any problems should occur.  Please show the CHEMOTHERAPY ALERT CARD or IMMUNOTHERAPY ALERT  CARD at check-in to the Emergency Department and triage nurse.  Should you have questions after your visit or need to cancel or reschedule your appointment, please contact [facility] - A DEPT OF [facility]  Dept: [phone]  and follow the prompts.  Office hours are 8:00 a.m. to 4:30 p.m. Monday - Friday. Please note that voicemails left after 4:00 p.m. may not be returned until the following business day.  We are closed weekends and major holidays. You have access to a nurse at all times for urgent questions. Please call the main number to the clinic Dept: [phone] and follow the prompts.   For any non-urgent questions, you may also contact your provider using MyChart. We now offer e-Visits for anyone 80 and older to request care online for non-urgent symptoms. For details visit [email or web address].   Also download the MyChart app! Go to the app store, search "MyChart", open the app, select [place], and log in with your MyChart username and password.

## 2024-03-20 ENCOUNTER — Ambulatory Visit: Admitting: Radiation Oncology

## 2024-03-20 ENCOUNTER — Ambulatory Visit

## 2024-03-21 ENCOUNTER — Other Ambulatory Visit: Payer: Self-pay | Admitting: Nurse Practitioner

## 2024-03-21 ENCOUNTER — Telehealth: Payer: Self-pay

## 2024-03-21 NOTE — Telephone Encounter (Signed)
 Pt called stating she think she has neuropathy in her hands and feet.  Pt is requesting if she could be prescribed something for neuropathy.  Notified Dr. Candise Chambers APPs of the pt's call.

## 2024-03-23 NOTE — Progress Notes (Unsigned)
 Patient Care Team: Glenn Lange, DO as PCP - General (Family Medicine) Avanell Leigh, MD as PCP - Cardiology (Cardiology) Brian Campanile, MD (Inactive) as Consulting Physician (Neurology) Sonja Rose Hill, MD as Consulting Physician (Hematology and Oncology)  Clinic Day:  03/24/2024  Referring physician: Glenn Lange, DO  ASSESSMENT & PLAN:   Assessment & Plan: Malignant neoplasm of upper-outer quadrant of left breast in female, estrogen receptor positive (HCC)  pT3N3aM0, stage IIIA, ER+/PR+/HER2- -diagnosed in 09/2023. Patient presented with a palpable left breast mass, ultrasound showed multiple enlarged left axillary lymph node. -Due to her underlying dementia, she underwent upfront surgery on 10/04/2023.  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 -Due to high risk of recurrence due to large number of positive lymph node, the recommendation is for adjuvant chemotherapy, followed by adjuvant radiation, aromatase inhibitor and CDK4/6 inhibitor  -Staging CT and bone scan was negative for distant metastasis, except a 1cm left axillary node which is likely reactive to surgery, will f/u on CT in 6 months  -she started adjuvant chemo AC on 11/12/2023, and weekly Taxol  on 01/07/24.  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 Continued cryotherapy during taxol , restart B complex vitamin, increase gabapentin , and dose reduce taxol  to 70 mg/m2 was  recommended. If CIPN is worse next week, will discuss further dose reduction vs stopping chemo early -She is otherwise tolerating well with good PS. Labs adequate for treatment -Proceed with cycle 10 Taxol  today, return for f/up and last 2 cycles. She is scheduled to see rad onc soon -We will see her back towards the end of radiation to discuss anti-estrogen therapy       Peripheral neuropathy Worsening peripheral neuropathy. Patient denies dropping objects. Neuropathy not interfering with daily  activities. Already taking gabapentin  300 mg daily. She does not feel as though this is helping very much. Currently receiving dose reduced Taxol  at 70mg /m2. Increase gabapentin  300 mg to twice daily. Additional dose reduction of taxol  to 50 mg/m2.    Anemia Moderate, slightly worsened anemia. Recommend OTC oral prenatal vitamin with additional iron to help improve anemia. Does not require blood or iron infusion at this time. Monitor closely.    Plan Reviewed labs -moderate anemia. Advised she try taking oral iron, prenatal supplement daily.  -CMP unremarkable.  Worsening peripheral neuropathy.  -increase frequency of gabapentin  300 mg to twice daily.  -dose reduce Taxol  to 50 mg/m2 for cycle 11 and 12.  Consultation appointment and CT Simulation appointments scheduled with radiation oncology on 6/20 and 6/26 respectively.  Abbey Abbe, follow up, and treatment in 1 week for cycle 12.   The patient understands the plans discussed today and is in agreement with them.  She knows to contact our office if she develops concerns prior to her next appointment.  I provided 25 minutes of face-to-face time during this encounter and > 50% was spent counseling as documented under my assessment and plan.    Sharyon Deis, NP  [facility] - A DEPT OF [facility] [address] [place] [address] Dept: [phone] Dept Fax: [phone]   No orders of the defined types were placed in this encounter.     CHIEF COMPLAINT:  CC: Left breast cancer, estrogen receptor positive  Current Treatment: AC T starting 11/12/2023  INTERVAL HISTORY:  [name] is here today for repeat clinical assessment.  She was last seen 03/17/2024 by Lacie, NP.  She  presents for cycle 11 of weekly Taxol .  She has been having persistent and worsening peripheral neuropathy.  Taxol  dose reduced to 70 mg/m at previous cycle due to worsening neuropathy. She  has not noted any improvement. In fact, she believes neuropathy has worsened. Does not interfere with her ability to participate in daily activities. Does not drop things with her hands. Reports the sensation as intense tingling. Currently takes gabapentin  300 mg daily. Was on this prior to starting on chemotherapy, so she doesn't believe it has helped with chemotherapy related neuropathy. She denies chest pain, chest pressure, or shortness of breath. She denies headaches or visual disturbances. She denies abdominal pain, nausea, vomiting, or changes in bowel or bladder habits.  States that sometimes, she will have mild diarrhea. If needed, she takes imodium for this. Has not needed to take imodium for some time. She denies fevers or chills. She denies pain. Her appetite is good. Her weight has decreased 2 pounds over last week. States that she is moderately active. Walks, at least, 3 days per week.   I have reviewed the past medical history, past surgical history, social history and family history with the patient and they are unchanged from previous note.  ALLERGIES:  is allergic to [facility] [aprepitant], hydrochlorothiazide , hydrocodone , and simvastatin.  MEDICATIONS:  Current Outpatient Medications  Medication Sig Dispense Refill   albuterol  (VENTOLIN  HFA) 108 (90 Base) MCG/ACT inhaler USE 2 INHALATIONS BY MOUTH EVERY 6 HOURS AS NEEDED FOR WHEEZING  OR SHORTNESS OF BREATH (Patient taking differently: Inhale 2 puffs into the lungs every 6 (six) hours as needed for wheezing or shortness of breath.) 26.8 g 2   amLODipine  (NORVASC ) 10 MG tablet TAKE 1 TABLET BY MOUTH EVERY  MORNING 100 tablet 2   aspirin  EC 81 MG tablet Take 81 mg by mouth daily. Swallow whole.     Budesonide  90 MCG/ACT inhaler Inhale 1 puff into the lungs 2 (two) times daily. 1 each 0   capsaicin  (ZOSTRIX) 0.025 % cream Apply topically 2 (two) times daily. 60 g 0   cholecalciferol (VITAMIN D3) 25 MCG (1000 UNIT) tablet Take 1 tablet  (1,000 Units total) by mouth daily. 7 tablet 0   Cholecalciferol (VITAMIN D3) 50 MCG (2000 UT) CHEW Chew 2,000 Units by mouth daily.     donepezil (ARICEPT) 10 MG tablet Take 10 mg by mouth at bedtime.     fluticasone  (FLONASE ) 50 MCG/ACT nasal spray USE 1 SPRAY IN BOTH NOSTRILS  DAILY (Patient taking differently: Place 1 spray into both nostrils daily as needed for allergies or rhinitis.) 16 g 0   fluticasone  (FLOVENT  HFA) 110 MCG/ACT inhaler Inhale 1 puff into the lungs daily as needed. (Patient taking differently: Inhale 1 puff into the lungs daily as needed ("for flares").) 1 each 12   gabapentin  (NEURONTIN ) 300 MG capsule Take 1 capsule (300 mg total) by mouth at bedtime. 30 capsule 3   KLOR-CON  M20 20 MEQ tablet Take 1 tablet by mouth twice daily 30 tablet 0   lidocaine -prilocaine  (EMLA ) cream Apply to affected area once 30 g 3   loratadine  (EQ ALLERGY RELIEF) 10 MG tablet Take 1 tablet (10 mg total) by mouth daily. (Patient taking differently: Take 10 mg by mouth daily as needed for allergies or rhinitis.) 90 tablet 3   losartan  (COZAAR ) 50 MG tablet Take 1 tablet (50 mg total) by mouth at bedtime. 90 tablet 3   metoprolol  tartrate (LOPRESSOR ) 50 MG tablet Take 1 tablet (50 mg total) by  mouth 2 (two) times daily. 200 tablet 2   ondansetron  (ZOFRAN ) 8 MG tablet Take 1 tab (8 mg) by mouth every 8 hrs as needed for nausea/vomiting. Start third day after doxorubicin /cyclophosphamide  chemotherapy. 30 tablet 1   ondansetron  (ZOFRAN -ODT) 4 MG disintegrating tablet Take 4 mg by mouth every 8 (eight) hours as needed for nausea or vomiting (dissolve orally).     prochlorperazine  (COMPAZINE ) 10 MG tablet Take 1 tablet (10 mg total) by mouth every 6 (six) hours as needed for nausea or vomiting. 30 tablet 1   rosuvastatin  (CRESTOR ) 5 MG tablet Take one tablet by mouth every Monday and Friday. (Patient taking differently: Take 5 mg by mouth See admin instructions. Take 5 mg by mouth every Monday and Friday)  20 tablet 4   sertraline  (ZOLOFT ) 50 MG tablet TAKE 1 TABLET BY MOUTH DAILY 100 tablet 3   traMADol  (ULTRAM ) 50 MG tablet Take 1 tablet (50 mg total) by mouth every 6 (six) hours as needed for moderate pain (pain score 4-6) or severe pain (pain score 7-10). 25 tablet 0   TYLENOL  500 MG tablet Take 500-1,000 mg by mouth every 6 (six) hours as needed for mild pain (pain score 1-3) or headache.     No current facility-administered medications for this visit.    HISTORY OF PRESENT ILLNESS:   Oncology History  Infiltrating ductal carcinoma of breast (HCC)  09/22/2023 Initial Diagnosis   Infiltrating ductal carcinoma of breast (HCC)   11/12/2023 -  Chemotherapy   Patient is on Treatment Plan : BREAST DOSE DENSE AC q14d / PACLitaxel  q7d     Malignant neoplasm of upper-outer quadrant of left breast in female, estrogen receptor positive (HCC)  09/28/2023 Initial Diagnosis   Malignant neoplasm of upper-outer quadrant of left breast in female, estrogen receptor positive (HCC)   09/28/2023 Cancer Staging   Staging form: Breast, AJCC 8th Edition - Clinical stage from 09/28/2023: Stage IIA (cT2, cN1, cM0, G2, ER+, PR+, HER2-) - Signed by Sonja Paden, MD on 09/28/2023 Histologic grading system: 3 grade system   10/04/2023 Cancer Staging   Staging form: Breast, AJCC 8th Edition - Pathologic stage from 10/04/2023: Stage IIIA (pT2, pN3a, cM0, G2, ER+, PR+, HER2-) - Signed by Sonja Winston, MD on 10/19/2023 Histologic grading system: 3 grade system Residual tumor (R): R0 - None   10/31/2023 Imaging   NM whole body bone scan IMPRESSION: No evidence skeletal metastasis.   10/31/2023 Imaging   CT chest abdomen and pelvis with contrast  IMPRESSION: 1. Status post lumpectomy of the upper outer left breast and left axillary lymph node dissection with associated simple attenuation fluid collections, most consistent with postoperative seromas. 2. Enlarged left axillary lymph node measuring 1.0 x 0.8 cm,  nonspecific and possibly reactive in the setting of recent surgery. Correlate with tissue findings of lymph node dissection. Attention on follow-up. 3. No other evidence of lymphadenopathy or metastatic disease in the chest, abdomen, or pelvis. 4. Nonobstructive bilateral nephrolithiasis.   11/08/2023 Echocardiogram   Echocardiogram FINDINGS   Left Ventricle: Left ventricular ejection fraction, by estimation, is 55 to 60%. Left ventricular ejection fraction by 3D volume is 56 %. The left ventricle has normal function. The left ventricle has no regional wall motion abnormalities. Global longitudinal strain performed but not reported based on interpreter judgement due to suboptimal tracking. The left ventricular internal cavity size was mildly dilated. There is mild left ventricular hypertrophy. Left ventricular diastolic parameters are consistent with Grade I diastolic dysfunction (impaired relaxation). Indeterminate  filling pressures.  Right Ventricle: The right ventricular size is normal. No increase in right ventricular wall thickness. Right ventricular systolic function is  low normal. There is normal pulmonary artery systolic pressure. The tricuspid regurgitant velocity is 2.12 m/s,  and with an assumed right atrial pressure of 3 mmHg, the estimated right ventricular systolic pressure is 21.0 mmHg.  Left Atrium: Left atrial size was normal in size.  Right Atrium: Right atrial size was normal in size.  Pericardium: There is no evidence of pericardial effusion.  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 No evidence of mitral valve stenosis.  Tricuspid Valve: The tricuspid valve is normal in structure. Tricuspid valve regurgitation is mild . No evidence of tricuspid stenosis.  Aortic Valve: The aortic valve is tricuspid. Aortic valve regurgitation is not visualized. No aortic stenosis is present.  Pulmonic Valve: The pulmonic valve was normal in structure.  Pulmonic valve regurgitation is mild. No evidence of pulmonic stenosis.  Aorta: The aortic root and ascending aorta are structurally normal, with no evidence of dilitation.  Venous: The inferior vena cava is normal in size with greater than 50% respiratory variability, suggesting right atrial pressure of 3 mmHg.  IAS/Shunts: There is redundancy of the interatrial septum. Cannot exclude a small PFO.         REVIEW OF SYSTEMS:   Constitutional: Denies fevers, chills or abnormal weight loss Eyes: Denies blurriness of vision Ears, nose, mouth, throat, and face: Denies mucositis or sore throat Respiratory: Denies cough, dyspnea or wheezes Cardiovascular: Denies palpitation, chest discomfort or lower extremity swelling Gastrointestinal:  Denies nausea, heartburn or change in bowel habits Skin: Denies abnormal skin rashes Lymphatics: Denies new lymphadenopathy or easy bruising Neurological:Denies numbness, tingling or new weaknesses. Increased neuropathy. Behavioral/Psych: Mood is stable, no new changes  All other systems were reviewed with the patient and are negative.   VITALS:   Today's Vitals   03/24/24 0811  BP: 136/82  Pulse: 81  Resp: 18  Temp: 97.8 F (36.6 C)  SpO2: 98%  Weight: 182 lb 12.8 oz (82.9 kg)   Body mass index is 33.43 kg/m.   Wt Readings from Last 3 Encounters:  03/24/24 182 lb 12.8 oz (82.9 kg)  03/11/24 184 lb 4.8 oz (83.6 kg)  03/03/24 181 lb 4 oz (82.2 kg)    Body mass index is 33.43 kg/m.  Performance status (ECOG): 1 - Symptomatic but completely ambulatory  PHYSICAL EXAM:   GENERAL:alert, no distress and comfortable SKIN: skin color, texture, turgor are normal, no rashes or significant lesions EYES: normal, Conjunctiva are pink and non-injected, sclera clear OROPHARYNX:no exudate, no erythema and lips, buccal mucosa, and tongue normal  NECK: supple, thyroid  normal size, non-tender, without nodularity LYMPH:  no palpable lymphadenopathy in  the cervical, axillary or inguinal LUNGS: clear to auscultation and percussion with normal breathing effort HEART: regular rate & rhythm and no murmurs and no lower extremity edema ABDOMEN:abdomen soft, non-tender and normal bowel sounds Musculoskeletal:no cyanosis of digits and no clubbing  NEURO: alert & oriented x 3 with fluent speech, no focal motor/sensory deficits  LABORATORY DATA:  I have reviewed the data as listed    Component Value Date/Time   NA 141 03/17/2024 1107   NA 141 12/29/2021 0953   K 3.7 03/17/2024 1107   CL 112 (H) 03/17/2024 1107   CO2 26 03/17/2024 1107   GLUCOSE 91 03/17/2024 1107   BUN 7 (L) 03/17/2024 1107   BUN 7 (L) 12/29/2021 0981  CREATININE 0.57 03/17/2024 1107   CREATININE 0.75 05/03/2015 1559   CALCIUM  10.1 03/17/2024 1107   PROT 6.6 03/17/2024 1107   PROT 6.9 11/30/2021 1040   ALBUMIN 3.9 03/17/2024 1107   ALBUMIN 4.2 11/30/2021 1040   AST 16 03/17/2024 1107   ALT 14 03/17/2024 1107   ALKPHOS 85 03/17/2024 1107   BILITOT 0.3 03/17/2024 1107   GFRNONAA >60 03/17/2024 1107   GFRNONAA >89 01/15/2014 1509   GFRAA 95 10/12/2020 0825   GFRAA >89 01/15/2014 1509    Lab Results  Component Value Date   WBC 4.4 03/24/2024   NEUTROABS 2.5 03/24/2024   HGB 8.9 (L) 03/24/2024   HCT 27.1 (L) 03/24/2024   MCV 91.2 03/24/2024   PLT 243 03/24/2024

## 2024-03-23 NOTE — Assessment & Plan Note (Signed)
 pT3N3aM0, stage IIIA, ER+/PR+/HER2- -diagnosed in 09/2023. Patient presented with a palpable left breast mass, ultrasound showed multiple enlarged left axillary lymph node. -Due to her underlying dementia, she underwent upfront surgery on 10/04/2023.  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 -Due to high risk of recurrence due to large number of positive lymph node, the recommendation is for adjuvant chemotherapy, followed by adjuvant radiation, aromatase inhibitor and CDK4/6 inhibitor  -Staging CT and bone scan was negative for distant metastasis, except a 1cm left axillary node which is likely reactive to surgery, will f/u on CT in 6 months  -she started adjuvant chemo AC on 11/12/2023, and weekly Taxol  on 01/07/24.  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 Continued cryotherapy during taxol , restart B complex vitamin, increase gabapentin , and dose reduce taxol  to 70 mg/m2 was  recommended. If CIPN is worse next week, will discuss further dose reduction vs stopping chemo early -She is otherwise tolerating well with good PS. Labs adequate for treatment -further dose reduction taxol  to 50 mg/m2 (Cycle 11 Taxol ) due to worsening CIPN. Increase dose gabapentin  300 mg to twice daily.  -initial appointment and CT simulation scheduled with radiation oncology on 6/20 and 6/26 respectively.  -follow up in 1 week for Cycle 12 taxol 

## 2024-03-24 ENCOUNTER — Encounter: Payer: Self-pay | Admitting: Hematology

## 2024-03-24 ENCOUNTER — Other Ambulatory Visit

## 2024-03-24 ENCOUNTER — Inpatient Hospital Stay

## 2024-03-24 ENCOUNTER — Ambulatory Visit

## 2024-03-24 ENCOUNTER — Inpatient Hospital Stay (HOSPITAL_BASED_OUTPATIENT_CLINIC_OR_DEPARTMENT_OTHER): Admitting: Nurse Practitioner

## 2024-03-24 ENCOUNTER — Encounter: Payer: Self-pay | Admitting: Nurse Practitioner

## 2024-03-24 VITALS — BP 136/82 | HR 81 | Temp 97.8°F | Resp 18 | Wt 182.8 lb

## 2024-03-24 DIAGNOSIS — Z5111 Encounter for antineoplastic chemotherapy: Secondary | ICD-10-CM | POA: Diagnosis not present

## 2024-03-24 DIAGNOSIS — C50412 Malignant neoplasm of upper-outer quadrant of left female breast: Secondary | ICD-10-CM

## 2024-03-24 DIAGNOSIS — G629 Polyneuropathy, unspecified: Secondary | ICD-10-CM | POA: Diagnosis not present

## 2024-03-24 DIAGNOSIS — Z170 Estrogen receptor positive status [ER+]: Secondary | ICD-10-CM | POA: Diagnosis not present

## 2024-03-24 DIAGNOSIS — M792 Neuralgia and neuritis, unspecified: Secondary | ICD-10-CM | POA: Diagnosis not present

## 2024-03-24 DIAGNOSIS — C773 Secondary and unspecified malignant neoplasm of axilla and upper limb lymph nodes: Secondary | ICD-10-CM | POA: Diagnosis not present

## 2024-03-24 DIAGNOSIS — Z95828 Presence of other vascular implants and grafts: Secondary | ICD-10-CM

## 2024-03-24 DIAGNOSIS — C50912 Malignant neoplasm of unspecified site of left female breast: Secondary | ICD-10-CM

## 2024-03-24 DIAGNOSIS — Z1732 Human epidermal growth factor receptor 2 negative status: Secondary | ICD-10-CM | POA: Diagnosis not present

## 2024-03-24 DIAGNOSIS — Z1721 Progesterone receptor positive status: Secondary | ICD-10-CM | POA: Diagnosis not present

## 2024-03-24 LAB — CBC WITH DIFFERENTIAL (CANCER CENTER ONLY)
Abs Immature Granulocytes: 0.03 10*3/uL (ref 0.00–0.07)
Basophils Absolute: 0 10*3/uL (ref 0.0–0.1)
Basophils Relative: 1 %
Eosinophils Absolute: 0.2 10*3/uL (ref 0.0–0.5)
Eosinophils Relative: 4 %
HCT: 27.1 % — ABNORMAL LOW (ref 36.0–46.0)
Hemoglobin: 8.9 g/dL — ABNORMAL LOW (ref 12.0–15.0)
Immature Granulocytes: 1 %
Lymphocytes Relative: 30 %
Lymphs Abs: 1.3 10*3/uL (ref 0.7–4.0)
MCH: 30 pg (ref 26.0–34.0)
MCHC: 32.8 g/dL (ref 30.0–36.0)
MCV: 91.2 fL (ref 80.0–100.0)
Monocytes Absolute: 0.3 10*3/uL (ref 0.1–1.0)
Monocytes Relative: 8 %
Neutro Abs: 2.5 10*3/uL (ref 1.7–7.7)
Neutrophils Relative %: 56 %
Platelet Count: 243 10*3/uL (ref 150–400)
RBC: 2.97 MIL/uL — ABNORMAL LOW (ref 3.87–5.11)
RDW: 15.5 % (ref 11.5–15.5)
WBC Count: 4.4 10*3/uL (ref 4.0–10.5)
nRBC: 0 % (ref 0.0–0.2)

## 2024-03-24 LAB — CMP (CANCER CENTER ONLY)
ALT: 11 U/L (ref 0–44)
AST: 14 U/L — ABNORMAL LOW (ref 15–41)
Albumin: 3.6 g/dL (ref 3.5–5.0)
Alkaline Phosphatase: 79 U/L (ref 38–126)
Anion gap: 5 (ref 5–15)
BUN: 7 mg/dL — ABNORMAL LOW (ref 8–23)
CO2: 25 mmol/L (ref 22–32)
Calcium: 9.8 mg/dL (ref 8.9–10.3)
Chloride: 110 mmol/L (ref 98–111)
Creatinine: 0.69 mg/dL (ref 0.44–1.00)
GFR, Estimated: >60 mL/min (ref >60)
Glucose, Bld: 115 mg/dL — ABNORMAL HIGH (ref 70–99)
Potassium: 3.5 mmol/L (ref 3.5–5.1)
Sodium: 140 mmol/L (ref 135–145)
Total Bilirubin: 0.3 mg/dL (ref 0.0–1.2)
Total Protein: 6 g/dL — ABNORMAL LOW (ref 6.5–8.1)

## 2024-03-24 MED ORDER — SODIUM CHLORIDE 0.9 % IV SOLN: INTRAVENOUS | Status: DC

## 2024-03-24 MED ORDER — SODIUM CHLORIDE 0.9% FLUSH
10.0000 mL | Freq: Once | INTRAVENOUS | Status: AC
  Administered 2024-03-24: 10 mL

## 2024-03-24 MED ORDER — FAMOTIDINE IN NACL 20-0.9 MG/50ML-% IV SOLN
20.0000 mg | Freq: Once | INTRAVENOUS | Status: AC
  Administered 2024-03-24: 20 mg via INTRAVENOUS

## 2024-03-24 MED ORDER — HEPARIN SOD (PORK) LOCK FLUSH 100 UNIT/ML IV SOLN
500.0000 [IU] | Freq: Once | INTRAVENOUS | Status: AC | PRN
  Administered 2024-03-24: 500 [IU]

## 2024-03-24 MED ORDER — DEXAMETHASONE SODIUM PHOSPHATE 10 MG/ML IJ SOLN
10.0000 mg | Freq: Once | INTRAMUSCULAR | Status: AC
  Administered 2024-03-24: 10 mg via INTRAVENOUS

## 2024-03-24 MED ORDER — DIPHENHYDRAMINE HCL 50 MG/ML IJ SOLN
50.0000 mg | Freq: Once | INTRAMUSCULAR | Status: AC
  Administered 2024-03-24: 50 mg via INTRAVENOUS

## 2024-03-24 MED ORDER — SODIUM CHLORIDE 0.9% FLUSH
10.0000 mL | INTRAVENOUS | Status: DC | PRN
Start: 2024-03-24 — End: 2024-03-24
  Administered 2024-03-24: 10 mL

## 2024-03-24 MED ORDER — SODIUM CHLORIDE 0.9 % IV SOLN
56.0000 mg/m2 | Freq: Once | INTRAVENOUS | Status: AC
  Administered 2024-03-24: 108 mg via INTRAVENOUS
  Filled 2024-03-24: qty 18

## 2024-03-24 MED ORDER — GABAPENTIN 300 MG PO CAPS
300.0000 mg | ORAL_CAPSULE | Freq: Two times a day (BID) | ORAL | 3 refills | Status: DC
Start: 2024-03-24 — End: 2024-08-17

## 2024-03-24 NOTE — Progress Notes (Signed)
 Decrease taxol  by 20% to 56mg /m2 per Pattie Borders, NP

## 2024-03-24 NOTE — Patient Instructions (Signed)
 [facility] - A DEPT OF [facility]  Discharge Instructions: Thank you for choosing [facility] to provide your oncology and hematology care.   If you have a lab appointment with the Cancer Center, please go directly to the Cancer Center and check in at the registration area.   Wear comfortable clothing and clothing appropriate for easy access to any Portacath or PICC line.   We strive to give you quality time with your provider. You may need to reschedule your appointment if you arrive late (15 or more minutes).  Arriving late affects you and other patients whose appointments are after yours.  Also, if you miss three or more appointments without notifying the office, you may be dismissed from the clinic at the provider's discretion.      For prescription refill requests, have your pharmacy contact our office and allow 72 hours for refills to be completed.    Today you received the following chemotherapy and/or immunotherapy agents paclitaxel      To help prevent nausea and vomiting after your treatment, we encourage you to take your nausea medication as directed.  BELOW ARE SYMPTOMS THAT SHOULD BE REPORTED IMMEDIATELY: *FEVER GREATER THAN 100.4 F (38 C) OR HIGHER *CHILLS OR SWEATING *NAUSEA AND VOMITING THAT IS NOT CONTROLLED WITH YOUR NAUSEA MEDICATION *UNUSUAL SHORTNESS OF BREATH *UNUSUAL BRUISING OR BLEEDING *URINARY PROBLEMS (pain or burning when urinating, or frequent urination) *BOWEL PROBLEMS (unusual diarrhea, constipation, pain near the anus) TENDERNESS IN MOUTH AND THROAT WITH OR WITHOUT PRESENCE OF ULCERS (sore throat, sores in mouth, or a toothache) UNUSUAL RASH, SWELLING OR PAIN  UNUSUAL VAGINAL DISCHARGE OR ITCHING   Items with * indicate a potential emergency and should be followed up as soon as possible or go to the Emergency Department if any problems should occur.  Please show the CHEMOTHERAPY ALERT CARD or IMMUNOTHERAPY  ALERT CARD at check-in to the Emergency Department and triage nurse.  Should you have questions after your visit or need to cancel or reschedule your appointment, please contact [facility] - A DEPT OF [facility]  Dept: [phone]  and follow the prompts.  Office hours are 8:00 a.m. to 4:30 p.m. Monday - Friday. Please note that voicemails left after 4:00 p.m. may not be returned until the following business day.  We are closed weekends and major holidays. You have access to a nurse at all times for urgent questions. Please call the main number to the clinic Dept: [phone] and follow the prompts.   For any non-urgent questions, you may also contact your provider using MyChart. We now offer e-Visits for anyone 38 and older to request care online for non-urgent symptoms. For details visit [email or web address].   Also download the MyChart app! Go to the app store, search "MyChart", open the app, select [place], and log in with your MyChart username and password.

## 2024-03-25 ENCOUNTER — Encounter: Payer: Self-pay | Admitting: *Deleted

## 2024-03-26 ENCOUNTER — Encounter: Payer: Self-pay | Admitting: Student

## 2024-03-31 ENCOUNTER — Ambulatory Visit: Admitting: Nurse Practitioner

## 2024-03-31 ENCOUNTER — Inpatient Hospital Stay

## 2024-03-31 NOTE — Progress Notes (Unsigned)
 [facility]   [phone] [phone]) [phone]    Patient Care Team: Glenn Lange, DO as PCP - General (Family Medicine) Avanell Leigh, MD as PCP - Cardiology (Cardiology) Brian Campanile, MD (Inactive) as Consulting Physician (Neurology) Sonja [place], MD as Consulting Physician (Hematology and Oncology)   CHIEF COMPLAINT:  Follow up left breast cancer   Oncology History  Infiltrating ductal carcinoma of breast (HCC)  09/22/2023 Initial Diagnosis   Infiltrating ductal carcinoma of breast (HCC)   11/12/2023 -  Chemotherapy   Patient is on Treatment Plan : BREAST DOSE DENSE AC q14d / PACLitaxel  q7d     Malignant neoplasm of upper-outer quadrant of left breast in female, estrogen receptor positive (HCC)  09/28/2023 Initial Diagnosis   Malignant neoplasm of upper-outer quadrant of left breast in female, estrogen receptor positive (HCC)   09/28/2023 Cancer Staging   Staging form: Breast, AJCC 8th Edition - Clinical stage from 09/28/2023: Stage IIA (cT2, cN1, cM0, G2, ER+, PR+, HER2-) - Signed by Sonja Brookhaven, MD on 09/28/2023 Histologic grading system: 3 grade system   10/04/2023 Cancer Staging   Staging form: Breast, AJCC 8th Edition - Pathologic stage from 10/04/2023: Stage IIIA (pT2, pN3a, cM0, G2, ER+, PR+, HER2-) - Signed by Sonja Alfalfa, MD on 10/19/2023 Histologic grading system: 3 grade system Residual tumor (R): R0 - None   10/31/2023 Imaging   NM whole body bone scan IMPRESSION: No evidence skeletal metastasis.   10/31/2023 Imaging   CT chest abdomen and pelvis with contrast  IMPRESSION: 1. Status post lumpectomy of the upper outer left breast and left axillary lymph node dissection with associated simple attenuation fluid collections, most consistent with postoperative seromas. 2. Enlarged left axillary lymph node measuring 1.0 x 0.8 cm, nonspecific and possibly reactive in the setting of recent surgery. Correlate with tissue findings of lymph  node dissection. Attention on follow-up. 3. No other evidence of lymphadenopathy or metastatic disease in the chest, abdomen, or pelvis. 4. Nonobstructive bilateral nephrolithiasis.   11/08/2023 Echocardiogram   Echocardiogram FINDINGS   Left Ventricle: Left ventricular ejection fraction, by estimation, is 55 to 60%. Left ventricular ejection fraction by 3D volume is 56 %. The left ventricle has normal function. The left ventricle has no regional wall motion abnormalities. Global longitudinal strain performed but not reported based on interpreter judgement due to suboptimal tracking. The left ventricular internal cavity size was mildly dilated. There is mild left ventricular hypertrophy. Left ventricular diastolic parameters are consistent with Grade I diastolic dysfunction (impaired relaxation). Indeterminate filling pressures.  Right Ventricle: The right ventricular size is normal. No increase in right ventricular wall thickness. Right ventricular systolic function is  low normal. There is normal pulmonary artery systolic pressure. The tricuspid regurgitant velocity is 2.12 m/s,  and with an assumed right atrial pressure of 3 mmHg, the estimated right ventricular systolic pressure is 21.0 mmHg.  Left Atrium: Left atrial size was normal in size.  Right Atrium: Right atrial size was normal in size.  Pericardium: There is no evidence of pericardial effusion.  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 No evidence of mitral valve stenosis.  Tricuspid Valve: The tricuspid valve is normal in structure. Tricuspid valve regurgitation is mild . No evidence of tricuspid stenosis.  Aortic Valve: The aortic valve is tricuspid. Aortic valve regurgitation is not visualized. No aortic stenosis is present.  Pulmonic Valve: The pulmonic valve was normal in structure. Pulmonic valve regurgitation is mild.  No evidence of pulmonic stenosis.  Aorta: The aortic root and ascending  aorta are structurally normal, with no evidence of dilitation.  Venous: The inferior vena cava is normal in size with greater than 50% respiratory variability, suggesting right atrial pressure of 3 mmHg.  IAS/Shunts: There is redundancy of the interatrial septum. Cannot exclude a small PFO.        CURRENT THERAPY: Adjuvant chemo AC-T starting 11/12/23   INTERVAL HISTORY [name] returns for follow up and treatment as scheduled. Here for last weekly Taxol  which has been dose reduced for neuropathy.   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   Hyperlipidemia    Hyperparathyroidism (HCC) 07/25/2019   Hypertension    Normal cardiac stress test    Myoview stress test   OAB (overactive bladder) 09/01/2015   Osteopenia 05/2018   T score -1.1 FRAX 2.5%/ 0.1%   Subclinical hyperthyroidism 05/08/2007   Qualifier: Diagnosis of  By: Daneil Dunker MD, [facility]       Past Surgical History:  Procedure Laterality Date   BREAST BIOPSY Left 09/19/2023   x's 2   BREAST BIOPSY Left 09/19/2023   US  LT BREAST BX W LOC DEV 1ST LESION IMG BX SPEC US  GUIDE 09/19/2023 [facility]   BREAST BIOPSY Left 10/03/2023   US  LT RADIOACTIVE SEED LOC 10/03/2023 [facility]   BREAST BIOPSY Left 10/03/2023   US  LT RADIOACTIVE SEED EA ADD LESION 10/03/2023 [facility]   BREAST LUMPECTOMY WITH RADIOACTIVE SEED AND AXILLARY LYMPH NODE DISSECTION Left 10/04/2023   Procedure: LEFT BREAST LUMPECTOMY AND TARGETED LYMPH NODE DISSECTION;  Surgeon: Oza Blumenthal, MD;  Location: [facility];  Service:  General;  Laterality: Left;   COLONOSCOPY     IR IMAGING GUIDED PORT INSERTION  11/05/2023   POLYPECTOMY     TOTAL HIP ARTHROPLASTY Left 10/01/2013   DR ROWAN   TOTAL HIP ARTHROPLASTY Left 10/01/2013   Procedure: TOTAL HIP ARTHROPLASTY;  Surgeon: Ilean Mall, MD;  Location: [facility] OR;  Service: Orthopedics;  Laterality: Left;     Outpatient Encounter Medications as of 04/01/2024  Medication Sig Note   albuterol  (VENTOLIN  HFA) 108 (90 Base) MCG/ACT inhaler USE 2 INHALATIONS BY MOUTH EVERY 6 HOURS AS NEEDED FOR WHEEZING  OR SHORTNESS OF BREATH (Patient taking differently: Inhale 2 puffs into the lungs every 6 (six) hours as needed for wheezing or shortness of breath.) 11/05/2023: None in  a week    amLODipine  (NORVASC ) 10 MG tablet TAKE 1 TABLET BY MOUTH EVERY  MORNING    aspirin  EC 81 MG tablet Take 81 mg by mouth daily. Swallow whole.    Budesonide  90 MCG/ACT inhaler Inhale 1 puff into the lungs 2 (two) times daily.    capsaicin  (ZOSTRIX) 0.025 % cream Apply topically 2 (two) times daily.    cholecalciferol (VITAMIN D3) 25 MCG (1000 UNIT) tablet Take 1 tablet (1,000 Units total) by mouth daily.    Cholecalciferol (VITAMIN D3) 50  MCG (2000 UT) CHEW Chew 2,000 Units by mouth daily.    donepezil (ARICEPT) 10 MG tablet Take 10 mg by mouth at bedtime.    fluticasone  (FLONASE ) 50 MCG/ACT nasal spray USE 1 SPRAY IN BOTH NOSTRILS  DAILY (Patient taking differently: Place 1 spray into both nostrils daily as needed for allergies or rhinitis.)    fluticasone  (FLOVENT  HFA) 110 MCG/ACT inhaler Inhale 1 puff into the lungs daily as needed. (Patient taking differently: Inhale 1 puff into the lungs daily as needed (for flares).)    gabapentin  (NEURONTIN ) 300 MG capsule Take 1 capsule (300 mg total) by mouth 2 (two) times daily.    KLOR-CON  M20 20 MEQ tablet Take 1 tablet by mouth twice daily    lidocaine -prilocaine  (EMLA ) cream Apply to affected area once    loratadine  (EQ ALLERGY RELIEF) 10 MG tablet Take 1  tablet (10 mg total) by mouth daily. (Patient taking differently: Take 10 mg by mouth daily as needed for allergies or rhinitis.)    losartan  (COZAAR ) 50 MG tablet Take 1 tablet (50 mg total) by mouth at bedtime.    metoprolol  tartrate (LOPRESSOR ) 50 MG tablet Take 1 tablet (50 mg total) by mouth 2 (two) times daily.    ondansetron  (ZOFRAN ) 8 MG tablet Take 1 tab (8 mg) by mouth every 8 hrs as needed for nausea/vomiting. Start third day after doxorubicin /cyclophosphamide  chemotherapy.    ondansetron  (ZOFRAN -ODT) 4 MG disintegrating tablet Take 4 mg by mouth every 8 (eight) hours as needed for nausea or vomiting (dissolve orally).    prochlorperazine  (COMPAZINE ) 10 MG tablet Take 1 tablet (10 mg total) by mouth every 6 (six) hours as needed for nausea or vomiting.    rosuvastatin  (CRESTOR ) 5 MG tablet Take one tablet by mouth every Monday and Friday. (Patient taking differently: Take 5 mg by mouth See admin instructions. Take 5 mg by mouth every Monday and Friday)    sertraline  (ZOLOFT ) 50 MG tablet TAKE 1 TABLET BY MOUTH DAILY    traMADol  (ULTRAM ) 50 MG tablet Take 1 tablet (50 mg total) by mouth every 6 (six) hours as needed for moderate pain (pain score 4-6) or severe pain (pain score 7-10).    TYLENOL  500 MG tablet Take 500-1,000 mg by mouth every 6 (six) hours as needed for mild pain (pain score 1-3) or headache.    No facility-administered encounter medications on file as of 04/01/2024.     There were no vitals filed for this visit. There is no height or weight on file to calculate BMI.   ECOG PERFORMANCE STATUS: {CHL ONC ECOG PS:[id number]}  PHYSICAL EXAM GENERAL:alert, no distress and comfortable SKIN: no rash  EYES: sclera clear NECK: without mass LYMPH:  no palpable cervical or supraclavicular lymphadenopathy  LUNGS: clear with normal breathing effort HEART: regular rate & rhythm, no lower extremity edema ABDOMEN: abdomen soft, non-tender and normal bowel sounds NEURO: alert &  oriented x 3 with fluent speech, no focal motor/sensory deficits Breast exam:  PAC without erythema    CBC    Latest Ref Rng & Units 03/24/2024    7:49 AM 03/17/2024   11:07 AM 03/11/2024    7:42 AM  CBC  WBC 4.0 - 10.5 K/uL 4.4  4.0  4.2   Hemoglobin 12.0 - 15.0 g/dL 8.9  9.8  9.4   Hematocrit 36.0 - 46.0 % 27.1  30.2  29.4   Platelets 150 - 400 K/uL 243  245  243       CMP  Latest Ref Rng & Units 03/24/2024    7:49 AM 03/17/2024   11:07 AM 03/11/2024    7:42 AM  CMP  Glucose 70 - 99 mg/dL 098  91  89   BUN 8 - 23 mg/dL 7  7  11    Creatinine 0.44 - 1.00 mg/dL 1.19  1.47  8.29   Sodium 135 - 145 mmol/L 140  141  141   Potassium 3.5 - 5.1 mmol/L 3.5  3.7  4.1   Chloride 98 - 111 mmol/L 110  112  112   CO2 22 - 32 mmol/L 25  26  24    Calcium  8.9 - 10.3 mg/dL 9.8  56.2  9.8   Total Protein 6.5 - 8.1 g/dL 6.0  6.6  6.3   Total Bilirubin 0.0 - 1.2 mg/dL 0.3  0.3  0.3   Alkaline Phos 38 - 126 U/L 79  85  85   AST 15 - 41 U/L 14  16  16    ALT 0 - 44 U/L 11  14  14        ASSESSMENT & PLAN:68 year old female   Malignant neoplasm of upper-outer quadrant of left breast in female, estrogen receptor positive, pT3N3aM0, stage IIIA, ER+/PR+/HER2- -diagnosed in 09/2023. Patient presented with a palpable left breast mass, ultrasound showed multiple enlarged left axillary lymph node. -Due to her underlying dementia, she underwent upfront surgery on 10/04/2023.  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 -Due to high risk of recurrence due to large number of positive lymph node, the recommendation is for adjuvant chemotherapy, followed by adjuvant radiation, aromatase inhibitor and CDK4/6 inhibitor  -Staging CT and bone scan was negative for distant metastasis, except a 1cm left axillary node which is likely reactive to surgery, will f/u on CT in 6 months  -she started adjuvant chemo AC on 11/12/2023, and weekly Taxol  on 01/07/24.  -[name] appears stable,  s/p cycle 9 weekly Taxol  at full dose, tolerating well overall. She may have some degree of anticipatory nausea, we reviewed management.  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 I recommend continue cryotherapy during taxol , restart B complex vitamin, increase gabapentin , and dose reduce taxol  to 70 mg/m2. Further dose reduced to 56 mg/m2 with C11     PLAN:  No orders of the defined types were placed in this encounter.     All questions were answered. The patient knows to call the clinic with any problems, questions or concerns. No barriers to learning were detected. I spent *** counseling the patient face to face. The total time spent in the appointment was *** and more than 50% was on counseling, review of test results, and coordination of care.   Beverly Pita K Judi Jaffe, NP 03/31/2024 4:21 PM

## 2024-04-01 ENCOUNTER — Other Ambulatory Visit: Payer: Self-pay

## 2024-04-01 ENCOUNTER — Inpatient Hospital Stay

## 2024-04-01 ENCOUNTER — Telehealth: Payer: Self-pay

## 2024-04-01 ENCOUNTER — Inpatient Hospital Stay (HOSPITAL_BASED_OUTPATIENT_CLINIC_OR_DEPARTMENT_OTHER): Admitting: Nurse Practitioner

## 2024-04-01 ENCOUNTER — Encounter: Payer: Self-pay | Admitting: Nurse Practitioner

## 2024-04-01 ENCOUNTER — Encounter

## 2024-04-01 VITALS — BP 140/88 | HR 108 | Temp 97.3°F | Resp 19 | Ht 62.0 in | Wt 185.1 lb

## 2024-04-01 DIAGNOSIS — G620 Drug-induced polyneuropathy: Secondary | ICD-10-CM

## 2024-04-01 DIAGNOSIS — C773 Secondary and unspecified malignant neoplasm of axilla and upper limb lymph nodes: Secondary | ICD-10-CM | POA: Diagnosis not present

## 2024-04-01 DIAGNOSIS — C50412 Malignant neoplasm of upper-outer quadrant of left female breast: Secondary | ICD-10-CM

## 2024-04-01 DIAGNOSIS — G629 Polyneuropathy, unspecified: Secondary | ICD-10-CM | POA: Diagnosis not present

## 2024-04-01 DIAGNOSIS — T451X5A Adverse effect of antineoplastic and immunosuppressive drugs, initial encounter: Secondary | ICD-10-CM | POA: Diagnosis not present

## 2024-04-01 DIAGNOSIS — Z1721 Progesterone receptor positive status: Secondary | ICD-10-CM | POA: Diagnosis not present

## 2024-04-01 DIAGNOSIS — Z5111 Encounter for antineoplastic chemotherapy: Secondary | ICD-10-CM | POA: Diagnosis not present

## 2024-04-01 DIAGNOSIS — Z1732 Human epidermal growth factor receptor 2 negative status: Secondary | ICD-10-CM | POA: Diagnosis not present

## 2024-04-01 DIAGNOSIS — Z170 Estrogen receptor positive status [ER+]: Secondary | ICD-10-CM

## 2024-04-01 NOTE — Telephone Encounter (Signed)
 Received telephone call from the patient stating her transportation had not arrived yet to bring her to her appointment. Patient stated she is ready and waiting for her [employer]. Let patient know that I would notify the receptionist as well as make the provider aware

## 2024-04-03 ENCOUNTER — Telehealth: Payer: Self-pay

## 2024-04-03 ENCOUNTER — Other Ambulatory Visit: Payer: Self-pay

## 2024-04-03 DIAGNOSIS — C50912 Malignant neoplasm of unspecified site of left female breast: Secondary | ICD-10-CM

## 2024-04-03 DIAGNOSIS — D649 Anemia, unspecified: Secondary | ICD-10-CM

## 2024-04-03 NOTE — Telephone Encounter (Signed)
 Spoke with patient via telephone call per Lacie Burton NP regarding the message below.   (Patient called to let us  know that her neuropathy is worsening and she would like something prescribed for it. Lacie suggested she take her gabapentin  twice a day for 1 week and then 3 times per day the next week. That should help with the neuropathy. Lidocaine  patches are over the counter and she can use those on her hip. Terrel Ferries, RN )  Patient verbalized understanding and did not have any further questions or concerns.

## 2024-04-03 NOTE — Telephone Encounter (Signed)
 Patient called to let us  know that her neuropathy is worsening and she would like something prescribed for it. Lacie suggested she take her gabapentin  twice a day for 1 week and then 3 times per day the next week. That should help with the neuropathy. Lidocaine  patches are over the counter and she can use those on her hip. Terrel Ferries, RN

## 2024-04-07 NOTE — Progress Notes (Signed)
 Radiation Oncology         [phone]) [phone] ________________________________  Name: [name]        MRN: [id number]  Date of Service: 04/10/2024 DOB: [birth year]  RR:Gnwzd, Lauraine, DO  [name], MD     REFERRING PHYSICIAN: [name], MD   DIAGNOSIS: There were no encounter diagnoses.   HISTORY OF PRESENT ILLNESS: [name] is a 68 y.o. female with a diagnosis of left breast cancer. The patient presented on 09/19/2023 with a palpable area in the left breast for several months, she had diagnostic workup that showed a a mass corresponding to the palpable finding measuring 2.7 cm in the upper outer quadrant of the left breast and slightly enlarged left axillary lymph nodes.  By ultrasound, this was noted in the 2 o'clock position of the left breast measuring 3 cm in greatest dimension with internal vascularity.  The axilla demonstrated multiple enlarged lymph nodes, the largest was up to 10 mm.  No abnormalities were noted in the right breast.  Biopsies on 09/19/2023 of the mass showed a grade 2 invasive ductal carcinoma and invasive carcinoma was also noted in the lymph node biopsy.  Her cancer was ER/PR positive HER2 negative with a Ki-67 of 30%.  She was counseled on surgical intervention and was taken on 10/04/2023 by Dr. Vernetta for left lumpectomy and axillary lymph node dissection.  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  Her invasive disease was 1 mm from the lateral margin, the remainder were negative for invasive, and more than 1 cm from all DCIS margins.  14 of 14 lymph nodes were removed from the left axilla and all contained metastatic disease with evidence of extranodal extension and axillary soft tissue involved by carcinoma.  The patient was also worked up on 10/20/2023 for neurologic deficits a CT of the head was negative for disease or stroke, MRI following this with and without contrast did not show evidence of metastatic  disease but showed mild chronic small vessel ischemic disease.     Since her last visit she had staging scans including a CT CAP and bone scan on 1/15/5 showed an enlarged node measuring 1 cm in the left axilla. No other sites of disease were noted. No additional surgery was recommended. She went on to complete chemotherapy between 11/12/23-03/24/24. She is seen to now consider adjuvant radiotherapy to the left breast and regional nodes.    PREVIOUS RADIATION THERAPY: No   PAST MEDICAL HISTORY:  Past Medical History:  Diagnosis Date   Allergy    Anxiety    Arthritis    degenerative in back and hips   Asthma    Atypical chest pain 05/03/2015   Low risk Myoview 2016, normal LVF by echo Dec 2020   Breast cancer (HCC) 10/04/2023   Carpal tunnel syndrome, bilateral    Chest pain    Depression    Elevated PTHrP level 06/19/2018   Syncope   Feeling grief 12/30/2015   Heart murmur    age 65 said had heart murmur.   Hyperlipidemia    Hyperparathyroidism (HCC) 07/25/2019   Hypertension    Normal cardiac stress test    Myoview stress test   OAB (overactive bladder) 09/01/2015   Osteopenia 05/2018   T score -1.1 FRAX 2.5%/ 0.1%   Subclinical hyperthyroidism 05/08/2007   Qualifier: Diagnosis of  By: Kennyth MD, Delon         PAST SURGICAL HISTORY:  Past Surgical History:  Procedure Laterality Date   BREAST BIOPSY Left 09/19/2023   x's 2   BREAST BIOPSY Left 09/19/2023   US  LT BREAST BX W LOC DEV 1ST LESION IMG BX SPEC US  GUIDE 09/19/2023 [facility]   BREAST BIOPSY Left 10/03/2023   US  LT RADIOACTIVE SEED LOC 10/03/2023 [facility]   BREAST BIOPSY Left 10/03/2023   US  LT RADIOACTIVE SEED EA ADD LESION 10/03/2023 [facility]   BREAST LUMPECTOMY WITH RADIOACTIVE SEED AND AXILLARY LYMPH NODE DISSECTION Left 10/04/2023   Procedure: LEFT BREAST LUMPECTOMY AND TARGETED LYMPH NODE DISSECTION;  Surgeon: Vernetta Berg, MD;  Location: [facility];   Service: General;  Laterality: Left;   COLONOSCOPY     IR IMAGING GUIDED PORT INSERTION  11/05/2023   POLYPECTOMY     TOTAL HIP ARTHROPLASTY Left 10/01/2013   DR ROWAN   TOTAL HIP ARTHROPLASTY Left 10/01/2013   Procedure: TOTAL HIP ARTHROPLASTY;  Surgeon: Dempsey JINNY Sensor, MD;  Location: [facility];  Service: Orthopedics;  Laterality: Left;     FAMILY HISTORY:  Family History  Adopted: Yes  Problem Relation Age of Onset   Hypertension Mother    Diabetes Father    Hyperlipidemia Brother    Breast cancer Paternal Grandmother 59 - 60   Heart attack Neg Hx    Sudden death Neg Hx    Colon cancer Neg Hx    Hypercalcemia Neg Hx      SOCIAL HISTORY:  reports that she has never smoked. She has never been exposed to tobacco smoke. She has never used smokeless tobacco. She reports that she does not drink alcohol and does not use drugs.  The patient is divorced and lives in [place].  She lives with her daughter due to her dementia, but navigates her home well.    ALLERGIES: Emend [aprepitant], Hydrochlorothiazide , Hydrocodone , and Simvastatin   MEDICATIONS:  Current Outpatient Medications  Medication Sig Dispense Refill   albuterol  (VENTOLIN  HFA) 108 (90 Base) MCG/ACT inhaler USE 2 INHALATIONS BY MOUTH EVERY 6 HOURS AS NEEDED FOR WHEEZING  OR SHORTNESS OF BREATH (Patient taking differently: Inhale 2 puffs into the lungs every 6 (six) hours as needed for wheezing or shortness of breath.) 26.8 g 2   amLODipine  (NORVASC ) 10 MG tablet TAKE 1 TABLET BY MOUTH EVERY  MORNING 100 tablet 2   aspirin  EC 81 MG tablet Take 81 mg by mouth daily. Swallow whole.     Budesonide  90 MCG/ACT inhaler Inhale 1 puff into the lungs 2 (two) times daily. 1 each 0   capsaicin  (ZOSTRIX) 0.025 % cream Apply topically 2 (two) times daily. 60 g 0   cholecalciferol (VITAMIN D3) 25 MCG (1000 UNIT) tablet Take 1 tablet (1,000 Units total) by mouth daily. 7 tablet 0   Cholecalciferol (VITAMIN D3) 50 MCG (2000 UT) CHEW Chew  2,000 Units by mouth daily.     donepezil (ARICEPT) 10 MG tablet Take 10 mg by mouth at bedtime.     fluticasone  (FLONASE ) 50 MCG/ACT nasal spray USE 1 SPRAY IN BOTH NOSTRILS  DAILY (Patient taking differently: Place 1 spray into both nostrils daily as needed for allergies or rhinitis.) 16 g 0   fluticasone  (FLOVENT  HFA) 110 MCG/ACT inhaler Inhale 1 puff into the lungs daily as needed. (Patient taking differently: Inhale 1 puff into the lungs daily as needed (for flares).) 1 each 12   gabapentin  (NEURONTIN ) 300 MG capsule Take 1 capsule (300 mg total) by mouth 2 (two) times daily. 60  capsule 3   KLOR-CON  M20 20 MEQ tablet Take 1 tablet by mouth twice daily 30 tablet 0   lidocaine -prilocaine  (EMLA ) cream Apply to affected area once 30 g 3   loratadine  (EQ ALLERGY RELIEF) 10 MG tablet Take 1 tablet (10 mg total) by mouth daily. (Patient taking differently: Take 10 mg by mouth daily as needed for allergies or rhinitis.) 90 tablet 3   losartan  (COZAAR ) 50 MG tablet Take 1 tablet (50 mg total) by mouth at bedtime. 90 tablet 3   metoprolol  tartrate (LOPRESSOR ) 50 MG tablet Take 1 tablet (50 mg total) by mouth 2 (two) times daily. 200 tablet 2   ondansetron  (ZOFRAN ) 8 MG tablet Take 1 tab (8 mg) by mouth every 8 hrs as needed for nausea/vomiting. Start third day after doxorubicin /cyclophosphamide  chemotherapy. 30 tablet 1   ondansetron  (ZOFRAN -ODT) 4 MG disintegrating tablet Take 4 mg by mouth every 8 (eight) hours as needed for nausea or vomiting (dissolve orally).     prochlorperazine  (COMPAZINE ) 10 MG tablet Take 1 tablet (10 mg total) by mouth every 6 (six) hours as needed for nausea or vomiting. 30 tablet 1   rosuvastatin  (CRESTOR ) 5 MG tablet Take one tablet by mouth every Monday and Friday. (Patient taking differently: Take 5 mg by mouth See admin instructions. Take 5 mg by mouth every Monday and Friday) 20 tablet 4   sertraline  (ZOLOFT ) 50 MG tablet TAKE 1 TABLET BY MOUTH DAILY 100 tablet 3    traMADol  (ULTRAM ) 50 MG tablet Take 1 tablet (50 mg total) by mouth every 6 (six) hours as needed for moderate pain (pain score 4-6) or severe pain (pain score 7-10). 25 tablet 0   TYLENOL  500 MG tablet Take 500-1,000 mg by mouth every 6 (six) hours as needed for mild pain (pain score 1-3) or headache.     No current facility-administered medications for this visit.     REVIEW OF SYSTEMS: On review of systems, the patient***     PHYSICAL EXAM:  Wt Readings from Last 3 Encounters:  04/01/24 185 lb 1.6 oz (84 kg)  03/24/24 182 lb 12.8 oz (82.9 kg)  03/11/24 184 lb 4.8 oz (83.6 kg)   Temp Readings from Last 3 Encounters:  04/01/24 (!) 97.3 F (36.3 C) (Temporal)  03/24/24 97.8 F (36.6 C)  03/17/24 98.5 F (36.9 C) (Oral)   BP Readings from Last 3 Encounters:  04/01/24 (!) 140/88  03/24/24 136/82  03/17/24 (!) 161/84   Pulse Readings from Last 3 Encounters:  04/01/24 (!) 108  03/24/24 81  03/17/24 75    In general this is a well appearing African American female in no acute distress. She's alert and oriented x4 and appropriate throughout the examination. Cardiopulmonary assessment is negative for acute distress and she exhibits normal effort. The left breast reveals a well healed surgical inision as well as her left axilla. No erythema was noted.     ECOG = 1  0 - Asymptomatic (Fully active, able to carry on all predisease activities without restriction)  1 - Symptomatic but completely ambulatory (Restricted in physically strenuous activity but ambulatory and able to carry out work of a light or sedentary nature. For example, light housework, office work)  2 - Symptomatic, <50% in bed during the day (Ambulatory and capable of all self care but unable to carry out any work activities. Up and about more than 50% of waking hours)  3 - Symptomatic, >50% in bed, but not bedbound (Capable of only limited  self-care, confined to bed or chair 50% or more of waking hours)  4 -  Bedbound (Completely disabled. Cannot carry on any self-care. Totally confined to bed or chair)  5 - Death   Raylene MM, Creech RH, Tormey DC, et al. [phone]). Toxicity and response criteria of the [facility]. Am. DOROTHA Bridges. Oncol. 5 (6): 649-55    LABORATORY DATA:  Lab Results  Component Value Date   WBC 4.4 03/24/2024   HGB 8.9 (L) 03/24/2024   HCT 27.1 (L) 03/24/2024   MCV 91.2 03/24/2024   PLT 243 03/24/2024   Lab Results  Component Value Date   NA 140 03/24/2024   K 3.5 03/24/2024   CL 110 03/24/2024   CO2 25 03/24/2024   Lab Results  Component Value Date   ALT 11 03/24/2024   AST 14 (L) 03/24/2024   ALKPHOS 79 03/24/2024   BILITOT 0.3 03/24/2024      RADIOGRAPHY: No results found.      IMPRESSION/PLAN: 1. 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 The patient has done well since her last visit and we reviewed her course to date. We reviewed  Dr. Smitty recommendations for external radiotherapy to the breast and regional nodes  to reduce risks of local recurrence. Dr. Lanny also plans for adjuvant antiestrogen therapy to follow. We discussed the risks, benefits, short, and long term effects of radiotherapy, as well as the curative intent, and the patient is interested in proceeding. We reviewed the delivery and logistics of radiotherapy and anticipates a course of 6 1/2 weeks of radiotherapy to the left breast and regional nodes with deep inspiration breath hold technique. She will simulate today *** 2. Transportation needs. Given her dementia she does not drive. ***She navigates her home well, but we will coordinate transportation options with our cancer center transportation team so she has more resources to attend appointments her family may not be able to bring her to.   In a visit lasting *** minutes, greater than 50% of the time was spent face to face discussing the patient's condition, in  preparation for the discussion, and coordinating the patient's care.      Donald KYM Husband, [facility]    **Disclaimer: This note was dictated with voice recognition software. Similar sounding words can inadvertently be transcribed and this note may contain transcription errors which may not have been corrected upon publication of note.**

## 2024-04-08 NOTE — Progress Notes (Signed)
 New Breast Cancer Diagnosis: Malignant neoplasm of upper-outer quadrant of left breast in female, estrogen receptor positive (HCC)   Did patient present with symptoms (if so, please note symptoms) or screening mammography?: Patient presented on 09/19/2023 with a palpable area in the left breast for several months, she had diagnostic workup that showed a a mass corresponding to the palpable finding measuring 2.7 cm in the upper outer quadrant of the left breast and slightly enlarged left axillary lymph nodes.   Location and Extent of disease :left breast. Located at 2 o'clock position, measured  3 cm in greatest dimension. Adenopathy yes.  Histology per Pathology Report: grade 2, Invasive Ductal Carcinoma FINAL MICROSCOPIC DIAGNOSIS:  A. BREAST, LEFT, LUMPECTOMY: Invasive ductal carcinoma, 4.6 cm, grade 2 Ductal carcinoma in situ:  solid type, intermediate grade Margins, invasive:  Negative           Closest, invasive:  1 mm from lateral margin Margins, DCIS:   Negative           Closest, DCIS:  > 10 mm from all margins Lymphovascular invasion:  Identified Prognostic markers: ER: 100%, positive, strong staining intensity PR: 100%, positive, strong staining intensity Her2: Negative, 1+ Ki-67: 30%      Biopsy site and coil shaped clip identified Other: None See oncology table  B. LYMPH NODE, LEFT AXILLARY TARGETED, DISSECTION:      Fourteen out of fourteen lymph nodes, positive for metastatic carcinoma (14/14)           Size of the largest metastatic focus: 2.4 cm / 24 mm      Extranodal extension identified on fourteen lymph nodes           Size the largest extranodal extension: 7 mm      Axillary soft tissue involved by carcinoma   Receptor Status: ER(positive), PR (positive), Her2-neu (negative), Ki-(67 of 30%)  Surgeon and surgical plan, if any: Dr. Vernetta: LEFT BREAST LUMPECTOMY AND TARGETED LYMPH NODE DISSECTION 10/04/2023.  Medical oncologist, treatment if any:  Dr. Lanny  02/11/2024 Assessment & Plan Breast cancer, undergoing adjuvant chemotherapy Undergoing adjuvant chemotherapy with Taxol  for breast cancer. Completed four weeks with seven weeks remaining. Reports fatigue, common with chemotherapy, but maintains ability to perform daily activities with some assistance. Weight gain indicates good nutritional intake. No new concerns regarding breast incision or port site. No numbness, tingling in extremities, chest discomfort, or gastrointestinal issues reported. Mild mouth sores resolved with mouthwash. - Continue weekly Taxol  infusions for seven more weeks - Schedule follow-up appointments every other week  04/01/2024 [name]- NP PLAN: -Discontinue Taxol  due to worsening CIPN despite dose reduction -Symptom management -Proceed to adjuvant radiation -F/up 7/28 to discuss adjuvant anti-estrogen therapy and CDK inhibitor   Family History of Breast/Ovarian/Prostate Cancer: Yes. Paternal grandmother- Breast Cancer in her 35's  Lymphedema issues, if any: Yes- ultrasound showed multiple enlarged left axillary lymph node.   Pain issues, if any:  Occasional, sharp in LT breast 8/10.  SAFETY ISSUES: Prior radiation? No Pacemaker/ICD? No Possible current pregnancy? No, Postmenopausal  Is the patient on methotrexate? No  Current Complaints / other details: Denies   Vitals: BP (!) 154/90 (BP Location: Left Arm, Patient Position: Sitting, Cuff Size: Normal)   Pulse 76   Temp (!) 96.4 F (35.8 C) (Temporal)   Resp 18   Ht 5' 2 (1.575 m)   Wt 183 lb 8 oz (83.2 kg)   SpO2 100%   BMI 33.56 kg/m   This concludes the interaction.  Beverly Minerva, LPN

## 2024-04-09 ENCOUNTER — Encounter: Payer: Self-pay | Admitting: *Deleted

## 2024-04-09 ENCOUNTER — Encounter: Payer: Self-pay | Admitting: Radiation Oncology

## 2024-04-10 ENCOUNTER — Encounter: Payer: Self-pay | Admitting: Radiation Oncology

## 2024-04-10 ENCOUNTER — Ambulatory Visit
Admission: RE | Admit: 2024-04-10 | Discharge: 2024-04-10 | Disposition: A | Discharge status: Home / Self Care | Source: Ambulatory Visit | Attending: Radiation Oncology | Admitting: Radiation Oncology

## 2024-04-10 VITALS — BP 154/90 | HR 76 | Temp 96.4°F | Resp 18 | Ht 62.0 in | Wt 183.5 lb

## 2024-04-10 DIAGNOSIS — T451X5A Adverse effect of antineoplastic and immunosuppressive drugs, initial encounter: Secondary | ICD-10-CM | POA: Insufficient documentation

## 2024-04-10 DIAGNOSIS — M129 Arthropathy, unspecified: Secondary | ICD-10-CM | POA: Diagnosis not present

## 2024-04-10 DIAGNOSIS — Z510 Encounter for antineoplastic radiation therapy: Secondary | ICD-10-CM | POA: Diagnosis not present

## 2024-04-10 DIAGNOSIS — I10 Essential (primary) hypertension: Secondary | ICD-10-CM | POA: Diagnosis not present

## 2024-04-10 DIAGNOSIS — F0390 Unspecified dementia without behavioral disturbance: Secondary | ICD-10-CM | POA: Insufficient documentation

## 2024-04-10 DIAGNOSIS — Z170 Estrogen receptor positive status [ER+]: Secondary | ICD-10-CM | POA: Insufficient documentation

## 2024-04-10 DIAGNOSIS — G620 Drug-induced polyneuropathy: Secondary | ICD-10-CM | POA: Insufficient documentation

## 2024-04-10 DIAGNOSIS — C50412 Malignant neoplasm of upper-outer quadrant of left female breast: Secondary | ICD-10-CM | POA: Insufficient documentation

## 2024-04-10 DIAGNOSIS — M8580 Other specified disorders of bone density and structure, unspecified site: Secondary | ICD-10-CM | POA: Diagnosis not present

## 2024-04-10 DIAGNOSIS — Z79899 Other long term (current) drug therapy: Secondary | ICD-10-CM | POA: Diagnosis not present

## 2024-04-10 DIAGNOSIS — E785 Hyperlipidemia, unspecified: Secondary | ICD-10-CM | POA: Diagnosis not present

## 2024-04-11 ENCOUNTER — Other Ambulatory Visit: Payer: Self-pay

## 2024-04-17 ENCOUNTER — Encounter: Payer: Self-pay | Admitting: *Deleted

## 2024-04-17 DIAGNOSIS — C50912 Malignant neoplasm of unspecified site of left female breast: Secondary | ICD-10-CM

## 2024-04-19 ENCOUNTER — Ambulatory Visit
Admission: RE | Admit: 2024-04-19 | Discharge: 2024-04-19 | Disposition: A | Discharge status: Home / Self Care | Source: Ambulatory Visit | Attending: Radiation Oncology | Admitting: Radiation Oncology

## 2024-04-19 DIAGNOSIS — Z170 Estrogen receptor positive status [ER+]: Secondary | ICD-10-CM | POA: Insufficient documentation

## 2024-04-19 DIAGNOSIS — T451X5A Adverse effect of antineoplastic and immunosuppressive drugs, initial encounter: Secondary | ICD-10-CM | POA: Diagnosis not present

## 2024-04-19 DIAGNOSIS — Z17411 Hormone receptor positive with human epidermal growth factor receptor 2 negative status: Secondary | ICD-10-CM | POA: Insufficient documentation

## 2024-04-19 DIAGNOSIS — G620 Drug-induced polyneuropathy: Secondary | ICD-10-CM | POA: Diagnosis not present

## 2024-04-19 DIAGNOSIS — C50412 Malignant neoplasm of upper-outer quadrant of left female breast: Secondary | ICD-10-CM | POA: Diagnosis not present

## 2024-04-19 DIAGNOSIS — Z510 Encounter for antineoplastic radiation therapy: Secondary | ICD-10-CM | POA: Diagnosis not present

## 2024-04-23 ENCOUNTER — Other Ambulatory Visit: Payer: Self-pay

## 2024-04-23 DIAGNOSIS — C50412 Malignant neoplasm of upper-outer quadrant of left female breast: Secondary | ICD-10-CM | POA: Diagnosis not present

## 2024-04-23 DIAGNOSIS — Z170 Estrogen receptor positive status [ER+]: Secondary | ICD-10-CM | POA: Diagnosis not present

## 2024-04-23 DIAGNOSIS — T451X5A Adverse effect of antineoplastic and immunosuppressive drugs, initial encounter: Secondary | ICD-10-CM | POA: Diagnosis not present

## 2024-04-23 DIAGNOSIS — Z510 Encounter for antineoplastic radiation therapy: Secondary | ICD-10-CM | POA: Diagnosis not present

## 2024-04-23 DIAGNOSIS — G620 Drug-induced polyneuropathy: Secondary | ICD-10-CM | POA: Diagnosis not present

## 2024-04-23 DIAGNOSIS — Z17411 Hormone receptor positive with human epidermal growth factor receptor 2 negative status: Secondary | ICD-10-CM | POA: Diagnosis not present

## 2024-04-23 LAB — RAD ONC ARIA SESSION SUMMARY

## 2024-04-24 ENCOUNTER — Other Ambulatory Visit: Payer: Self-pay

## 2024-04-24 ENCOUNTER — Ambulatory Visit
Admission: RE | Admit: 2024-04-24 | Discharge: 2024-04-24 | Disposition: A | Discharge status: Home / Self Care | Source: Ambulatory Visit | Attending: Radiation Oncology | Admitting: Radiation Oncology

## 2024-04-24 DIAGNOSIS — G620 Drug-induced polyneuropathy: Secondary | ICD-10-CM | POA: Diagnosis not present

## 2024-04-24 DIAGNOSIS — Z510 Encounter for antineoplastic radiation therapy: Secondary | ICD-10-CM | POA: Diagnosis not present

## 2024-04-24 DIAGNOSIS — Z17411 Hormone receptor positive with human epidermal growth factor receptor 2 negative status: Secondary | ICD-10-CM | POA: Diagnosis not present

## 2024-04-24 DIAGNOSIS — T451X5A Adverse effect of antineoplastic and immunosuppressive drugs, initial encounter: Secondary | ICD-10-CM | POA: Diagnosis not present

## 2024-04-24 DIAGNOSIS — C50412 Malignant neoplasm of upper-outer quadrant of left female breast: Secondary | ICD-10-CM | POA: Diagnosis not present

## 2024-04-24 DIAGNOSIS — Z170 Estrogen receptor positive status [ER+]: Secondary | ICD-10-CM | POA: Diagnosis not present

## 2024-04-24 LAB — RAD ONC ARIA SESSION SUMMARY

## 2024-04-25 ENCOUNTER — Ambulatory Visit
Admission: RE | Admit: 2024-04-25 | Discharge: 2024-04-25 | Disposition: A | Discharge status: Home / Self Care | Source: Ambulatory Visit | Attending: Radiation Oncology

## 2024-04-25 ENCOUNTER — Other Ambulatory Visit: Payer: Self-pay

## 2024-04-25 DIAGNOSIS — Z170 Estrogen receptor positive status [ER+]: Secondary | ICD-10-CM

## 2024-04-25 DIAGNOSIS — C50412 Malignant neoplasm of upper-outer quadrant of left female breast: Secondary | ICD-10-CM | POA: Diagnosis not present

## 2024-04-25 DIAGNOSIS — G620 Drug-induced polyneuropathy: Secondary | ICD-10-CM | POA: Diagnosis not present

## 2024-04-25 DIAGNOSIS — Z510 Encounter for antineoplastic radiation therapy: Secondary | ICD-10-CM | POA: Diagnosis not present

## 2024-04-25 DIAGNOSIS — Z17411 Hormone receptor positive with human epidermal growth factor receptor 2 negative status: Secondary | ICD-10-CM | POA: Diagnosis not present

## 2024-04-25 DIAGNOSIS — T451X5A Adverse effect of antineoplastic and immunosuppressive drugs, initial encounter: Secondary | ICD-10-CM | POA: Diagnosis not present

## 2024-04-25 LAB — RAD ONC ARIA SESSION SUMMARY

## 2024-04-25 MED ORDER — ALRA NON-METALLIC DEODORANT (RAD-ONC)
1.0000 | Freq: Once | TOPICAL | Status: AC
  Administered 2024-04-25: 1 via TOPICAL

## 2024-04-25 MED ORDER — RADIAPLEXRX EX GEL: Freq: Once | CUTANEOUS | Status: AC

## 2024-04-28 ENCOUNTER — Other Ambulatory Visit: Payer: Self-pay

## 2024-04-28 ENCOUNTER — Ambulatory Visit
Admission: RE | Admit: 2024-04-28 | Discharge: 2024-04-28 | Disposition: A | Discharge status: Home / Self Care | Source: Ambulatory Visit | Attending: Radiation Oncology | Admitting: Radiation Oncology

## 2024-04-28 DIAGNOSIS — C50412 Malignant neoplasm of upper-outer quadrant of left female breast: Secondary | ICD-10-CM | POA: Diagnosis not present

## 2024-04-28 DIAGNOSIS — Z17411 Hormone receptor positive with human epidermal growth factor receptor 2 negative status: Secondary | ICD-10-CM | POA: Diagnosis not present

## 2024-04-28 DIAGNOSIS — Z510 Encounter for antineoplastic radiation therapy: Secondary | ICD-10-CM | POA: Diagnosis not present

## 2024-04-28 DIAGNOSIS — G620 Drug-induced polyneuropathy: Secondary | ICD-10-CM | POA: Diagnosis not present

## 2024-04-28 DIAGNOSIS — Z170 Estrogen receptor positive status [ER+]: Secondary | ICD-10-CM | POA: Diagnosis not present

## 2024-04-28 DIAGNOSIS — T451X5A Adverse effect of antineoplastic and immunosuppressive drugs, initial encounter: Secondary | ICD-10-CM | POA: Diagnosis not present

## 2024-04-28 LAB — RAD ONC ARIA SESSION SUMMARY

## 2024-04-29 ENCOUNTER — Other Ambulatory Visit: Payer: Self-pay

## 2024-04-29 ENCOUNTER — Ambulatory Visit
Admission: RE | Admit: 2024-04-29 | Discharge: 2024-04-29 | Disposition: A | Discharge status: Home / Self Care | Source: Ambulatory Visit | Attending: Radiation Oncology

## 2024-04-29 DIAGNOSIS — G620 Drug-induced polyneuropathy: Secondary | ICD-10-CM | POA: Diagnosis not present

## 2024-04-29 DIAGNOSIS — Z17411 Hormone receptor positive with human epidermal growth factor receptor 2 negative status: Secondary | ICD-10-CM | POA: Diagnosis not present

## 2024-04-29 DIAGNOSIS — C50412 Malignant neoplasm of upper-outer quadrant of left female breast: Secondary | ICD-10-CM | POA: Diagnosis not present

## 2024-04-29 DIAGNOSIS — T451X5A Adverse effect of antineoplastic and immunosuppressive drugs, initial encounter: Secondary | ICD-10-CM | POA: Diagnosis not present

## 2024-04-29 DIAGNOSIS — Z170 Estrogen receptor positive status [ER+]: Secondary | ICD-10-CM | POA: Diagnosis not present

## 2024-04-29 DIAGNOSIS — Z510 Encounter for antineoplastic radiation therapy: Secondary | ICD-10-CM | POA: Diagnosis not present

## 2024-04-29 LAB — RAD ONC ARIA SESSION SUMMARY

## 2024-04-30 ENCOUNTER — Ambulatory Visit
Admission: RE | Admit: 2024-04-30 | Discharge: 2024-04-30 | Disposition: A | Discharge status: Home / Self Care | Source: Ambulatory Visit | Attending: Radiation Oncology

## 2024-04-30 ENCOUNTER — Other Ambulatory Visit: Payer: Self-pay

## 2024-04-30 DIAGNOSIS — Z17411 Hormone receptor positive with human epidermal growth factor receptor 2 negative status: Secondary | ICD-10-CM | POA: Diagnosis not present

## 2024-04-30 DIAGNOSIS — Z170 Estrogen receptor positive status [ER+]: Secondary | ICD-10-CM | POA: Diagnosis not present

## 2024-04-30 DIAGNOSIS — Z510 Encounter for antineoplastic radiation therapy: Secondary | ICD-10-CM | POA: Diagnosis not present

## 2024-04-30 DIAGNOSIS — T451X5A Adverse effect of antineoplastic and immunosuppressive drugs, initial encounter: Secondary | ICD-10-CM | POA: Diagnosis not present

## 2024-04-30 DIAGNOSIS — G620 Drug-induced polyneuropathy: Secondary | ICD-10-CM | POA: Diagnosis not present

## 2024-04-30 DIAGNOSIS — C50412 Malignant neoplasm of upper-outer quadrant of left female breast: Secondary | ICD-10-CM | POA: Diagnosis not present

## 2024-04-30 LAB — RAD ONC ARIA SESSION SUMMARY

## 2024-05-01 ENCOUNTER — Other Ambulatory Visit: Payer: Self-pay

## 2024-05-01 ENCOUNTER — Ambulatory Visit
Admission: RE | Admit: 2024-05-01 | Discharge: 2024-05-01 | Disposition: A | Discharge status: Home / Self Care | Source: Ambulatory Visit | Attending: Radiation Oncology | Admitting: Radiation Oncology

## 2024-05-01 DIAGNOSIS — T451X5A Adverse effect of antineoplastic and immunosuppressive drugs, initial encounter: Secondary | ICD-10-CM | POA: Diagnosis not present

## 2024-05-01 DIAGNOSIS — G620 Drug-induced polyneuropathy: Secondary | ICD-10-CM | POA: Diagnosis not present

## 2024-05-01 DIAGNOSIS — Z510 Encounter for antineoplastic radiation therapy: Secondary | ICD-10-CM | POA: Diagnosis not present

## 2024-05-01 DIAGNOSIS — C50412 Malignant neoplasm of upper-outer quadrant of left female breast: Secondary | ICD-10-CM | POA: Diagnosis not present

## 2024-05-01 DIAGNOSIS — Z170 Estrogen receptor positive status [ER+]: Secondary | ICD-10-CM | POA: Diagnosis not present

## 2024-05-01 DIAGNOSIS — Z17411 Hormone receptor positive with human epidermal growth factor receptor 2 negative status: Secondary | ICD-10-CM | POA: Diagnosis not present

## 2024-05-01 LAB — RAD ONC ARIA SESSION SUMMARY

## 2024-05-02 ENCOUNTER — Other Ambulatory Visit: Payer: Self-pay

## 2024-05-02 ENCOUNTER — Ambulatory Visit
Admission: RE | Admit: 2024-05-02 | Discharge: 2024-05-02 | Disposition: A | Discharge status: Home / Self Care | Source: Ambulatory Visit | Attending: Radiation Oncology | Admitting: Radiation Oncology

## 2024-05-02 DIAGNOSIS — G620 Drug-induced polyneuropathy: Secondary | ICD-10-CM | POA: Diagnosis not present

## 2024-05-02 DIAGNOSIS — Z17411 Hormone receptor positive with human epidermal growth factor receptor 2 negative status: Secondary | ICD-10-CM | POA: Diagnosis not present

## 2024-05-02 DIAGNOSIS — Z510 Encounter for antineoplastic radiation therapy: Secondary | ICD-10-CM | POA: Diagnosis not present

## 2024-05-02 DIAGNOSIS — C50412 Malignant neoplasm of upper-outer quadrant of left female breast: Secondary | ICD-10-CM | POA: Diagnosis not present

## 2024-05-02 DIAGNOSIS — T451X5A Adverse effect of antineoplastic and immunosuppressive drugs, initial encounter: Secondary | ICD-10-CM | POA: Diagnosis not present

## 2024-05-02 DIAGNOSIS — Z170 Estrogen receptor positive status [ER+]: Secondary | ICD-10-CM | POA: Diagnosis not present

## 2024-05-02 LAB — RAD ONC ARIA SESSION SUMMARY

## 2024-05-05 ENCOUNTER — Other Ambulatory Visit: Payer: Self-pay

## 2024-05-05 ENCOUNTER — Ambulatory Visit
Admission: RE | Admit: 2024-05-05 | Discharge: 2024-05-05 | Disposition: A | Discharge status: Home / Self Care | Source: Ambulatory Visit | Attending: Radiation Oncology

## 2024-05-05 DIAGNOSIS — Z510 Encounter for antineoplastic radiation therapy: Secondary | ICD-10-CM | POA: Diagnosis not present

## 2024-05-05 DIAGNOSIS — Z17411 Hormone receptor positive with human epidermal growth factor receptor 2 negative status: Secondary | ICD-10-CM | POA: Diagnosis not present

## 2024-05-05 DIAGNOSIS — C50412 Malignant neoplasm of upper-outer quadrant of left female breast: Secondary | ICD-10-CM | POA: Diagnosis not present

## 2024-05-05 DIAGNOSIS — G620 Drug-induced polyneuropathy: Secondary | ICD-10-CM | POA: Diagnosis not present

## 2024-05-05 DIAGNOSIS — Z170 Estrogen receptor positive status [ER+]: Secondary | ICD-10-CM | POA: Diagnosis not present

## 2024-05-05 DIAGNOSIS — T451X5A Adverse effect of antineoplastic and immunosuppressive drugs, initial encounter: Secondary | ICD-10-CM | POA: Diagnosis not present

## 2024-05-05 LAB — RAD ONC ARIA SESSION SUMMARY
Course Elapsed Days: 12
Plan Fractions Treated to Date: 9
Plan Fractions Treated to Date: 9
Plan Prescribed Dose Per Fraction: 1.8 Gy
Plan Prescribed Dose Per Fraction: 1.8 Gy
Plan Total Fractions Prescribed: 28
Plan Total Fractions Prescribed: 28
Plan Total Prescribed Dose: 50.4 Gy
Plan Total Prescribed Dose: 50.4 Gy
Reference Point Dosage Given to Date: 16.2 Gy
Reference Point Dosage Given to Date: 16.2 Gy
Reference Point Session Dosage Given: 1.8 Gy
Reference Point Session Dosage Given: 1.8 Gy
Session Number: 9

## 2024-05-06 ENCOUNTER — Ambulatory Visit (INDEPENDENT_AMBULATORY_CARE_PROVIDER_SITE_OTHER): Admitting: Family Medicine

## 2024-05-06 ENCOUNTER — Telehealth: Payer: Self-pay

## 2024-05-06 ENCOUNTER — Ambulatory Visit
Admission: RE | Admit: 2024-05-06 | Discharge: 2024-05-06 | Discharge status: Home / Self Care | Source: Ambulatory Visit | Attending: Radiation Oncology

## 2024-05-06 ENCOUNTER — Other Ambulatory Visit: Payer: Self-pay

## 2024-05-06 VITALS — BP 116/74 | HR 80 | Wt 175.0 lb

## 2024-05-06 DIAGNOSIS — L080 Pyoderma: Secondary | ICD-10-CM | POA: Diagnosis not present

## 2024-05-06 DIAGNOSIS — Z17411 Hormone receptor positive with human epidermal growth factor receptor 2 negative status: Secondary | ICD-10-CM | POA: Diagnosis not present

## 2024-05-06 DIAGNOSIS — C50412 Malignant neoplasm of upper-outer quadrant of left female breast: Secondary | ICD-10-CM | POA: Diagnosis not present

## 2024-05-06 DIAGNOSIS — I10 Essential (primary) hypertension: Secondary | ICD-10-CM | POA: Diagnosis not present

## 2024-05-06 DIAGNOSIS — T451X5A Adverse effect of antineoplastic and immunosuppressive drugs, initial encounter: Secondary | ICD-10-CM | POA: Diagnosis not present

## 2024-05-06 DIAGNOSIS — G620 Drug-induced polyneuropathy: Secondary | ICD-10-CM | POA: Diagnosis not present

## 2024-05-06 DIAGNOSIS — Z170 Estrogen receptor positive status [ER+]: Secondary | ICD-10-CM | POA: Diagnosis not present

## 2024-05-06 DIAGNOSIS — Z510 Encounter for antineoplastic radiation therapy: Secondary | ICD-10-CM | POA: Diagnosis not present

## 2024-05-06 LAB — RAD ONC ARIA SESSION SUMMARY

## 2024-05-06 MED ORDER — METOPROLOL TARTRATE 50 MG PO TABS: 50.0000 mg | ORAL_TABLET | Freq: Two times a day (BID) | ORAL | 2 refills | Status: DC

## 2024-05-06 MED ORDER — ACYCLOVIR 5 % EX CREA: 1.0000 | TOPICAL_CREAM | Freq: Four times a day (QID) | CUTANEOUS | 0 refills | Status: DC

## 2024-05-06 NOTE — Progress Notes (Signed)
    SUBJECTIVE:   CHIEF COMPLAINT / HPI:   Rash on back, L side History gathered from daughter, patient has dementia. First noticed on Thursday last week.  Had similar looking lesions on her left shin, these have since resolved.  Has not noticed lesions anywhere else on her body. Itchy, not painful Has had shingles in the past, this is not feel like that Currently undergoing radiation for breast cancer, today was 10th session Rad onc didn't know what it was, advised following up with PCP  PERTINENT  PMH / PSH: Estrogen positive breast cancer (s/p Taxol  therapy, currently undergoing radiation therapy), dementia, OA of R hip, HLD, depression, anxiety  OBJECTIVE:   BP 116/74   Pulse 80   Wt 175 lb (79.4 kg)   SpO2 98%   BMI 32.01 kg/m   General: Awake and alert, no acute distress Pulm: Normal WOB on RA Derm: Firm, raised clusters of 3 to 4 mm pustules on lower back and left buttock with surrounding hyperpigmentation.  Nontender to palpation.  No drainage.  See photo Psych: Appropriate mood and affect    ASSESSMENT/PLAN:   Assessment & Plan Pustular rash Acute onset of pustular rash along lower back and upper buttocks.  Low concern for shingles given lack of pain.  Possible infectious etiology given immunosuppression with active cancer versus ?drug reaction (though does not have any new meds) vs benign pustular rash.  Will check HIV, RPR, and HSV antibodies. - HIV antibody (with reflex) - RPR - acyclovir  cream (ZOVIRAX ) 5 %; Apply 1 Application topically 4 (four) times daily for 7 days.  Dispense: 28 g; Refill: 0 - HSV 1 and 2 Ab, IgG - HSV 1/2 Ab (IgM), IFA w/rflx Titer - HSV 1 antibody, IgG - HSV-2 Ab, IgG - Scheduled for close follow-up next week - Return precautions reviewed Essential hypertension Blood pressure well-controlled today.  Requests refill of metoprolol . - metoprolol  tartrate (LOPRESSOR ) 50 MG tablet; Take 1 tablet (50 mg total) by mouth 2 (two) times daily.   Dispense: 200 tablet; Refill: 2     Rea Raring, MD [facility]

## 2024-05-06 NOTE — Telephone Encounter (Signed)
 Received call from pharmacist regarding acyclovir  cream. They report that the cream does not come in quantity prescribed. They are asking to change from cream to ointment.   Spoke with Dr. Adele who provided verbal auth to change from cream to ointment.   Provided update to pharmacist.   Beverly JAYSON English, RN

## 2024-05-06 NOTE — Assessment & Plan Note (Signed)
 Blood pressure well-controlled today.  Requests refill of metoprolol . - metoprolol  tartrate (LOPRESSOR ) 50 MG tablet; Take 1 tablet (50 mg total) by mouth 2 (two) times daily.  Dispense: 200 tablet; Refill: 2

## 2024-05-06 NOTE — Patient Instructions (Signed)
 Thank you for coming in today! Here is a summary of what we discussed:  - Try the acyclovir  ointment to help with the rash  - If the rash spreads or gets worse, please call our office or seek medical treatment  We are checking some labs today. If they are abnormal, I will call you. If they are normal, I will send you a MyChart message (if it is active) or a letter in the mail. If you do not hear about your labs in the next 2 weeks, please call the office.   Please call the clinic at [phone] if your symptoms worsen or you have any concerns.  Best, Dr Adele

## 2024-05-07 ENCOUNTER — Ambulatory Visit

## 2024-05-07 LAB — HSV 1 AND 2 AB, IGG
HSV 1 Glycoprotein G Ab, IgG: REACTIVE — AB
HSV 2 IgG, Type Spec: REACTIVE — AB

## 2024-05-07 LAB — HIV ANTIBODY (ROUTINE TESTING W REFLEX): HIV Screen 4th Generation wRfx: NONREACTIVE

## 2024-05-07 LAB — RPR: RPR Ser Ql: NONREACTIVE

## 2024-05-08 ENCOUNTER — Other Ambulatory Visit: Payer: Self-pay

## 2024-05-08 ENCOUNTER — Ambulatory Visit
Admission: RE | Admit: 2024-05-08 | Discharge: 2024-05-08 | Disposition: A | Discharge status: Home / Self Care | Source: Ambulatory Visit | Attending: Radiation Oncology | Admitting: Radiation Oncology

## 2024-05-08 ENCOUNTER — Other Ambulatory Visit: Payer: Self-pay | Admitting: Family Medicine

## 2024-05-08 ENCOUNTER — Ambulatory Visit: Payer: Self-pay | Admitting: Family Medicine

## 2024-05-08 DIAGNOSIS — G620 Drug-induced polyneuropathy: Secondary | ICD-10-CM | POA: Diagnosis not present

## 2024-05-08 DIAGNOSIS — C50412 Malignant neoplasm of upper-outer quadrant of left female breast: Secondary | ICD-10-CM | POA: Diagnosis not present

## 2024-05-08 DIAGNOSIS — L080 Pyoderma: Secondary | ICD-10-CM

## 2024-05-08 DIAGNOSIS — Z510 Encounter for antineoplastic radiation therapy: Secondary | ICD-10-CM | POA: Diagnosis not present

## 2024-05-08 DIAGNOSIS — T451X5A Adverse effect of antineoplastic and immunosuppressive drugs, initial encounter: Secondary | ICD-10-CM | POA: Diagnosis not present

## 2024-05-08 DIAGNOSIS — Z170 Estrogen receptor positive status [ER+]: Secondary | ICD-10-CM | POA: Diagnosis not present

## 2024-05-08 DIAGNOSIS — Z17411 Hormone receptor positive with human epidermal growth factor receptor 2 negative status: Secondary | ICD-10-CM | POA: Diagnosis not present

## 2024-05-08 LAB — RAD ONC ARIA SESSION SUMMARY
Course Elapsed Days: 15
Plan Fractions Treated to Date: 11
Plan Fractions Treated to Date: 11
Plan Prescribed Dose Per Fraction: 1.8 Gy
Plan Prescribed Dose Per Fraction: 1.8 Gy
Plan Total Fractions Prescribed: 28
Plan Total Fractions Prescribed: 28
Plan Total Prescribed Dose: 50.4 Gy
Plan Total Prescribed Dose: 50.4 Gy
Reference Point Dosage Given to Date: 19.8 Gy
Reference Point Dosage Given to Date: 19.8 Gy
Reference Point Session Dosage Given: 1.8 Gy
Reference Point Session Dosage Given: 1.8 Gy
Session Number: 11

## 2024-05-08 MED ORDER — VALACYCLOVIR HCL 500 MG PO TABS: 500.0000 mg | ORAL_TABLET | Freq: Two times a day (BID) | ORAL | 0 refills | Status: AC

## 2024-05-09 ENCOUNTER — Ambulatory Visit

## 2024-05-10 ENCOUNTER — Other Ambulatory Visit: Payer: Self-pay

## 2024-05-10 NOTE — Assessment & Plan Note (Signed)
-  pT3N3aM0, stage IIIA, ER+/PR+/HER2- -diagnosed in 09/2023. Patient presented with a palpable left breast mass, ultrasound showed multiple enlarged left axillary lymph node. -Due to her underlying dementia, she underwent upfront surgery on 10/04/2023.  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 -I discussed the high risk of recurrence due to large number of positive lymph node, and the benefit of adjuvant chemotherapy, followed by adjuvant radiation, aromatase inhibitor and CDK4/6 inhibitor  -her staging CT and bone scan was negative for distant metastasis, except a 1cm left axillary node which is likely reactive to surgery, will f/u on CT in 6 months  -she started adjuvant chemo AC on 11/12/2023, and weekly Taxol  on 01/07/24. She completed chemo on 04/01/2024 -due to her very high risk of recurrence, will monitor ctDNA testing every 3-6 months

## 2024-05-12 ENCOUNTER — Inpatient Hospital Stay

## 2024-05-12 ENCOUNTER — Other Ambulatory Visit: Payer: Self-pay

## 2024-05-12 ENCOUNTER — Ambulatory Visit
Admission: RE | Admit: 2024-05-12 | Discharge: 2024-05-12 | Disposition: A | Discharge status: Home / Self Care | Source: Ambulatory Visit | Attending: Radiation Oncology

## 2024-05-12 ENCOUNTER — Inpatient Hospital Stay (HOSPITAL_BASED_OUTPATIENT_CLINIC_OR_DEPARTMENT_OTHER): Admitting: Hematology

## 2024-05-12 ENCOUNTER — Ambulatory Visit (INDEPENDENT_AMBULATORY_CARE_PROVIDER_SITE_OTHER): Payer: Self-pay

## 2024-05-12 VITALS — BP 118/64 | HR 80 | Wt 177.0 lb

## 2024-05-12 VITALS — BP 142/76 | HR 91 | Temp 97.5°F | Resp 16 | Ht 62.0 in | Wt 178.2 lb

## 2024-05-12 DIAGNOSIS — C50412 Malignant neoplasm of upper-outer quadrant of left female breast: Secondary | ICD-10-CM | POA: Diagnosis not present

## 2024-05-12 DIAGNOSIS — L080 Pyoderma: Secondary | ICD-10-CM | POA: Diagnosis not present

## 2024-05-12 DIAGNOSIS — E2839 Other primary ovarian failure: Secondary | ICD-10-CM

## 2024-05-12 DIAGNOSIS — Z7982 Long term (current) use of aspirin: Secondary | ICD-10-CM | POA: Insufficient documentation

## 2024-05-12 DIAGNOSIS — C773 Secondary and unspecified malignant neoplasm of axilla and upper limb lymph nodes: Secondary | ICD-10-CM | POA: Insufficient documentation

## 2024-05-12 DIAGNOSIS — Z79811 Long term (current) use of aromatase inhibitors: Secondary | ICD-10-CM | POA: Insufficient documentation

## 2024-05-12 DIAGNOSIS — Z79899 Other long term (current) drug therapy: Secondary | ICD-10-CM | POA: Insufficient documentation

## 2024-05-12 DIAGNOSIS — I10 Essential (primary) hypertension: Secondary | ICD-10-CM | POA: Diagnosis not present

## 2024-05-12 DIAGNOSIS — Z923 Personal history of irradiation: Secondary | ICD-10-CM | POA: Insufficient documentation

## 2024-05-12 DIAGNOSIS — Z95828 Presence of other vascular implants and grafts: Secondary | ICD-10-CM

## 2024-05-12 DIAGNOSIS — Z1721 Progesterone receptor positive status: Secondary | ICD-10-CM | POA: Insufficient documentation

## 2024-05-12 DIAGNOSIS — T451X5A Adverse effect of antineoplastic and immunosuppressive drugs, initial encounter: Secondary | ICD-10-CM | POA: Insufficient documentation

## 2024-05-12 DIAGNOSIS — Z510 Encounter for antineoplastic radiation therapy: Secondary | ICD-10-CM | POA: Diagnosis not present

## 2024-05-12 DIAGNOSIS — Z1732 Human epidermal growth factor receptor 2 negative status: Secondary | ICD-10-CM | POA: Insufficient documentation

## 2024-05-12 DIAGNOSIS — G620 Drug-induced polyneuropathy: Secondary | ICD-10-CM | POA: Insufficient documentation

## 2024-05-12 DIAGNOSIS — Z170 Estrogen receptor positive status [ER+]: Secondary | ICD-10-CM | POA: Diagnosis not present

## 2024-05-12 DIAGNOSIS — Z17411 Hormone receptor positive with human epidermal growth factor receptor 2 negative status: Secondary | ICD-10-CM | POA: Diagnosis not present

## 2024-05-12 DIAGNOSIS — C50912 Malignant neoplasm of unspecified site of left female breast: Secondary | ICD-10-CM

## 2024-05-12 DIAGNOSIS — Z79624 Long term (current) use of inhibitors of nucleotide synthesis: Secondary | ICD-10-CM | POA: Insufficient documentation

## 2024-05-12 LAB — RAD ONC ARIA SESSION SUMMARY
Course Elapsed Days: 19
Plan Fractions Treated to Date: 12
Plan Fractions Treated to Date: 12
Plan Prescribed Dose Per Fraction: 1.8 Gy
Plan Prescribed Dose Per Fraction: 1.8 Gy
Plan Total Fractions Prescribed: 28
Plan Total Fractions Prescribed: 28
Plan Total Prescribed Dose: 50.4 Gy
Plan Total Prescribed Dose: 50.4 Gy
Reference Point Dosage Given to Date: 21.6 Gy
Reference Point Dosage Given to Date: 21.6 Gy
Reference Point Session Dosage Given: 1.8 Gy
Reference Point Session Dosage Given: 1.8 Gy
Session Number: 12

## 2024-05-12 LAB — CMP (CANCER CENTER ONLY)
ALT: 14 U/L (ref 0–44)
AST: 16 U/L (ref 15–41)
Albumin: 3.7 g/dL (ref 3.5–5.0)
Alkaline Phosphatase: 91 U/L (ref 38–126)
Anion gap: 4 — ABNORMAL LOW (ref 5–15)
BUN: 7 mg/dL — ABNORMAL LOW (ref 8–23)
CO2: 27 mmol/L (ref 22–32)
Calcium: 10.3 mg/dL (ref 8.9–10.3)
Chloride: 110 mmol/L (ref 98–111)
Creatinine: 0.7 mg/dL (ref 0.44–1.00)
GFR, Estimated: >60 mL/min (ref >60)
Glucose, Bld: 111 mg/dL — ABNORMAL HIGH (ref 70–99)
Potassium: 3.6 mmol/L (ref 3.5–5.1)
Sodium: 141 mmol/L (ref 135–145)
Total Bilirubin: 0.3 mg/dL (ref 0.0–1.2)
Total Protein: 6.8 g/dL (ref 6.5–8.1)

## 2024-05-12 LAB — CBC WITH DIFFERENTIAL (CANCER CENTER ONLY)
Abs Immature Granulocytes: 0.02 K/uL (ref 0.00–0.07)
Basophils Absolute: 0 K/uL (ref 0.0–0.1)
Basophils Relative: 1 %
Eosinophils Absolute: 0.3 K/uL (ref 0.0–0.5)
Eosinophils Relative: 6 %
HCT: 31.9 % — ABNORMAL LOW (ref 36.0–46.0)
Hemoglobin: 10.2 g/dL — ABNORMAL LOW (ref 12.0–15.0)
Immature Granulocytes: 0 %
Lymphocytes Relative: 23 %
Lymphs Abs: 1.1 K/uL (ref 0.7–4.0)
MCH: 29.2 pg (ref 26.0–34.0)
MCHC: 32 g/dL (ref 30.0–36.0)
MCV: 91.4 fL (ref 80.0–100.0)
Monocytes Absolute: 0.4 K/uL (ref 0.1–1.0)
Monocytes Relative: 9 %
Neutro Abs: 3.1 K/uL (ref 1.7–7.7)
Neutrophils Relative %: 61 %
Platelet Count: 247 K/uL (ref 150–400)
RBC: 3.49 MIL/uL — ABNORMAL LOW (ref 3.87–5.11)
RDW: 13.1 % (ref 11.5–15.5)
WBC Count: 5 K/uL (ref 4.0–10.5)
nRBC: 0 % (ref 0.0–0.2)

## 2024-05-12 MED ORDER — HEPARIN SOD (PORK) LOCK FLUSH 100 UNIT/ML IV SOLN
500.0000 [IU] | Freq: Once | INTRAVENOUS | Status: AC
  Administered 2024-05-12: 500 [IU]

## 2024-05-12 MED ORDER — ANASTROZOLE 1 MG PO TABS: 1.0000 mg | ORAL_TABLET | Freq: Every day | ORAL | 3 refills | Status: DC

## 2024-05-12 MED ORDER — AMLODIPINE BESYLATE 10 MG PO TABS: 10.0000 mg | ORAL_TABLET | Freq: Every morning | ORAL | 2 refills | Status: DC

## 2024-05-12 MED ORDER — SODIUM CHLORIDE 0.9% FLUSH
10.0000 mL | Freq: Once | INTRAVENOUS | Status: AC
Start: 2024-05-12 — End: 2024-05-12
  Administered 2024-05-12: 10 mL

## 2024-05-12 NOTE — Progress Notes (Signed)
    SUBJECTIVE:   CHIEF COMPLAINT / HPI:   [name] is a 68yo F who presents to the office with her daughter for follow up of low back rash. The patient was seen for the same on 05/06/2024 by Dr. Rea Raring. She was prescribed acyclovir  cream (ZOVIRAX ) 5 %; Apply 1 Application topically 4 (four) times daily for 7 days. Patient states she has been using this as prescribed and it has helped. She denies pruritus, discharge, fever, other skin changes, or any other complaints at this time. 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 Dr. Raring sent in a 7 day course of Valtrex  500 BID. The patient states she is taking this as prescribed.   PERTINENT  PMH / PSH: Estrogen positive breast cancer (s/p Taxol  therapy, currently undergoing radiation therapy), dementia, h/o shingles   OBJECTIVE:   Wt 177 lb (80.3 kg)   BMI 32.37 kg/m    General: A&O, NAD, pleasant HEENT: No sign of trauma, EOM grossly intact Respiratory: normal WOB GI: non-distended  Extremities: no peripheral edema. Neuro: Normal gait, moves all four extremities appropriately Skin: ?dermatomal distribution of 3 scabbed and well healing clusters to the L lower back and buttock without erythema, tenderness, drainage, or other overlying skin changes Psych: Appropriate mood and affect   ASSESSMENT/PLAN:   Assessment & Plan Pustular rash ?atypical presentation of shingles. Well healing with acyclovir  cream and oral rx. -Finish course of Valtrex .  -Return precautions discussed.  Essential hypertension BP well controlled in office today, daughter requests refill since she accidentally asked for the wrong BP med refill during last visit.  -Amlodipine  10mg  daily refill sent to pharmacy.    Camie Dixons, DO [facility]

## 2024-05-12 NOTE — Patient Instructions (Signed)
 It was wonderful to see you today.  Please bring ALL of your medications with you to every visit.   Today we talked about:  Your back rash. I'm glad you are doing so much better! It looks like it is healing nicely. Finish your antibiotic and continue to use the cream as needed on the rash.   I have sent in your refill for the amlodipine .   Thank you for choosing [facility].   Please call [phone] with any questions about today's appointment.  Please arrive at least 15 minutes prior to your scheduled appointments.   If you had blood work today, I will send you a MyChart message or a letter if results are normal. Otherwise, I will give you a call.   If you had a referral placed, they will call you to set up an appointment. Please give us  a call if you don't hear back in the next 2 weeks.   If you need additional refills before your next appointment, please call your pharmacy first.   You should follow up in our clinic as needed.   Camie Dixons, DO Family Medicine

## 2024-05-12 NOTE — Assessment & Plan Note (Signed)
 BP well controlled in office today, daughter requests refill since she accidentally asked for the wrong BP med refill during last visit.  -Amlodipine  10mg  daily refill sent to pharmacy.

## 2024-05-12 NOTE — Progress Notes (Signed)
 [facility]   [phone] [phone]   Clinic Follow up Note   Patient Care Team: [name], MD as PCP - General (Family Medicine) [name], MD as PCP - Cardiology (Cardiology) [name], MD (Inactive) as Consulting Physician (Neurology) [name], MD as Consulting Physician (Hematology and Oncology)  Date of Service:  05/12/2024  CHIEF COMPLAINT: f/u of breast cancer  CURRENT THERAPY:  Adjuvant radiation  Oncology History   Malignant neoplasm of upper-outer quadrant of left breast in female, estrogen receptor positive (HCC) -pT3N3aM0, stage IIIA, ER+/PR+/HER2- -diagnosed in 09/2023. Patient presented with a palpable left breast mass, ultrasound showed multiple enlarged left axillary lymph node. -Due to her underlying dementia, she underwent upfront surgery on 10/04/2023.  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 -I discussed the high risk of recurrence due to large number of positive lymph node, and the benefit of adjuvant chemotherapy, followed by adjuvant radiation, aromatase inhibitor and CDK4/6 inhibitor  -her staging CT and bone scan was negative for distant metastasis, except a 1cm left axillary node which is likely reactive to surgery, will f/u on CT in 6 months  -she started adjuvant chemo AC on 11/12/2023, and weekly Taxol  on 01/07/24. She completed chemo on 04/01/2024 -due to her very high risk of recurrence, will monitor ctDNA testing every 3-6 months    Assessment & Plan Breast cancer undergoing radiation therapy Breast cancer currently being treated with radiation therapy. Reports fatigue, which is common with radiation. Radiation therapy is scheduled to complete on June 10, 2024. Post-radiation, anastrozole , an anti-estrogen therapy, will be initiated. Discussed potential side effects of anastrozole , including hot flashes, joint pain, and slowed metabolism. She has minimal  arthritis and is aware of the potential for joint pain with anastrozole . Discussed the need for monitoring weight and cholesterol due to slowed metabolism. - Continue radiation therapy, scheduled to complete on June 10, 2024 - Initiate anastrozole  therapy in mid-September after completion of radiation - Order bone density scan to be scheduled at [facility]  Chemotherapy-induced peripheral neuropathy Persistent neuropathy characterized by numbness and tingling in hands, affecting daily activities such as opening bottles and buttoning. No improvement since chemotherapy. Discussed the potential benefits of B12 supplementation and physical therapy to improve circulation and nerve recovery. - Recommend B12 supplementation, available over the counter - Encourage hand exercises, such as squeezing a ball, to improve circulation - Refer to physical therapy for further management of neuropathy and post-breast surgery rehabilitation  Weight loss and decreased appetite Reports weight loss and decreased appetite, with food tasting slightly off. Discussed the importance of maintaining adequate nutrition, particularly high-quality protein intake, to support recovery and overall health. - Encourage high-protein diet, including chicken, fish, and eggs - Consider protein smoothies to supplement dietary intake  Planned right hip replacement Right hip replacement was planned prior to breast cancer diagnosis. Reports some hip pain and difficulty rising from a seated position. Plans to revisit orthopedic surgeon after completion of radiation therapy to discuss potential surgery. - Advise follow-up with orthopedic surgeon post-radiation therapy to discuss right hip replacement  Plan - She is tolerating radiation well overall - I recommend adjuvant anastrozole , starting 2 to 3 weeks after she completes radiation.  Prescription called in today - Continue adjuvant radiation for 4 more weeks - Follow-up in  2-3 months, will add CDK4/6 inhibitor or screen for clinical trail    SUMMARY OF ONCOLOGIC HISTORY: Oncology History  Infiltrating ductal carcinoma of breast (HCC)  09/22/2023 Initial Diagnosis   Infiltrating ductal carcinoma of breast (HCC)   11/12/2023 -  Chemotherapy   Patient is on Treatment Plan : BREAST DOSE DENSE AC q14d / PACLitaxel  q7d     Malignant neoplasm of upper-outer quadrant of left breast in female, estrogen receptor positive (HCC)  09/28/2023 Initial Diagnosis   Malignant neoplasm of upper-outer quadrant of left breast in female, estrogen receptor positive (HCC)   09/28/2023 Cancer Staging   Staging form: Breast, AJCC 8th Edition - Clinical stage from 09/28/2023: Stage IIA (cT2, cN1, cM0, G2, ER+, PR+, HER2-) - Signed by [name], MD on 09/28/2023 Histologic grading system: 3 grade system   10/04/2023 Cancer Staging   Staging form: Breast, AJCC 8th Edition - Pathologic stage from 10/04/2023: Stage IIIA (pT2, pN3a, cM0, G2, ER+, PR+, HER2-) - Signed by [name], MD on 10/19/2023 Histologic grading system: 3 grade system Residual tumor (R): R0 - None   10/31/2023 Imaging   NM whole body bone scan IMPRESSION: No evidence skeletal metastasis.   10/31/2023 Imaging   CT chest abdomen and pelvis with contrast  IMPRESSION: 1. Status post lumpectomy of the upper outer left breast and left axillary lymph node dissection with associated simple attenuation fluid collections, most consistent with postoperative seromas. 2. Enlarged left axillary lymph node measuring 1.0 x 0.8 cm, nonspecific and possibly reactive in the setting of recent surgery. Correlate with tissue findings of lymph node dissection. Attention on follow-up. 3. No other evidence of lymphadenopathy or metastatic disease in the chest, abdomen, or pelvis. 4. Nonobstructive bilateral nephrolithiasis.   11/08/2023 Echocardiogram   Echocardiogram FINDINGS   Left Ventricle: Left ventricular ejection fraction, by  estimation, is 55 to 60%. Left ventricular ejection fraction by 3D volume is 56 %. The left ventricle has normal function. The left ventricle has no regional wall motion abnormalities. Global longitudinal strain performed but not reported based on interpreter judgement due to suboptimal tracking. The left ventricular internal cavity size was mildly dilated. There is mild left ventricular hypertrophy. Left ventricular diastolic parameters are consistent with Grade I diastolic dysfunction (impaired relaxation). Indeterminate filling pressures.  Right Ventricle: The right ventricular size is normal. No increase in right ventricular wall thickness. Right ventricular systolic function is  low normal. There is normal pulmonary artery systolic pressure. The tricuspid regurgitant velocity is 2.12 m/s,  and with an assumed right atrial pressure of 3 mmHg, the estimated right ventricular systolic pressure is 21.0 mmHg.  Left Atrium: Left atrial size was normal in size.  Right Atrium: Right atrial size was normal in size.  Pericardium: There is no evidence of pericardial effusion.  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 No evidence of mitral valve stenosis.  Tricuspid Valve: The tricuspid valve is normal in structure. Tricuspid valve regurgitation is mild . No evidence of tricuspid stenosis.  Aortic Valve: The aortic valve is tricuspid. Aortic valve regurgitation is not visualized. No aortic stenosis is present.  Pulmonic Valve: The pulmonic valve was normal in structure. Pulmonic valve regurgitation is mild. No evidence of pulmonic stenosis.  Aorta: The aortic root and ascending aorta are structurally normal, with no evidence of dilitation.  Venous: The inferior vena cava is normal in size with greater than 50% respiratory variability, suggesting right atrial pressure of 3 mmHg.  IAS/Shunts: There is redundancy of the interatrial septum. Cannot exclude a small PFO.         Discussed the use of AI scribe software for clinical note transcription with  the patient, who gave verbal consent to proceed.  History of Present Illness [name] is a 68 year old female with breast cancer who presents for follow-up. She is accompanied by her daughter.  She is undergoing radiation therapy with sixteen sessions remaining, experiencing fatigue as a side effect. Neuropathy persists from chemotherapy, with numbness and tingling in her hands affecting daily tasks. Her appetite has normalized, but she perceives a change in taste and has experienced weight loss. She occasionally consumes protein smoothies and eats chicken and fish but is uncertain about her protein intake. A port is in place, requiring flushing every six to eight weeks, and blood draws should be avoided from the left arm due to previous breast cancer surgery.     All other systems were reviewed with the patient and are negative.  MEDICAL HISTORY:  Past Medical History:  Diagnosis Date   Allergy    Anxiety    Arthritis    degenerative in back and hips   Asthma    Atypical chest pain 05/03/2015   Low risk Myoview 2016, normal LVF by echo Dec 2020   Breast cancer (HCC) 10/04/2023   Carpal tunnel syndrome, bilateral    Chest pain    Depression    Elevated PTHrP level 06/19/2018   Syncope   Feeling grief 12/30/2015   Heart murmur    age 11 said had heart murmur.   Hyperlipidemia    Hyperparathyroidism (HCC) 07/25/2019   Hypertension    Normal cardiac stress test    Myoview stress test   OAB (overactive bladder) 09/01/2015   Osteopenia 05/2018   T score -1.1 FRAX 2.5%/ 0.1%   Subclinical hyperthyroidism 05/08/2007   Qualifier: Diagnosis of  By: Kennyth MD, Delon      SURGICAL HISTORY: Past Surgical History:  Procedure Laterality Date   BREAST BIOPSY Left 09/19/2023   x's 2   BREAST BIOPSY Left 09/19/2023   US  LT BREAST BX W LOC DEV 1ST LESION IMG BX SPEC US  GUIDE 09/19/2023 [facility]  MAMMOGRAPHY   BREAST BIOPSY Left 10/03/2023   US  LT RADIOACTIVE SEED LOC 10/03/2023 [facility] MAMMOGRAPHY   BREAST BIOPSY Left 10/03/2023   US  LT RADIOACTIVE SEED EA ADD LESION 10/03/2023 [facility] MAMMOGRAPHY   BREAST LUMPECTOMY WITH RADIOACTIVE SEED AND AXILLARY LYMPH NODE DISSECTION Left 10/04/2023   Procedure: LEFT BREAST LUMPECTOMY AND TARGETED LYMPH NODE DISSECTION;  Surgeon: Vernetta Berg, MD;  Location:  SURGERY CENTER;  Service: General;  Laterality: Left;   COLONOSCOPY     IR IMAGING GUIDED PORT INSERTION  11/05/2023   POLYPECTOMY     TOTAL HIP ARTHROPLASTY Left 10/01/2013   DR ROWAN   TOTAL HIP ARTHROPLASTY Left 10/01/2013   Procedure: TOTAL HIP ARTHROPLASTY;  Surgeon: Dempsey JINNY Sensor, MD;  Location: [facility] OR;  Service: Orthopedics;  Laterality: Left;    I have reviewed the social history and family history with the patient and they are unchanged from previous note.  ALLERGIES:  is allergic to [facility] [aprepitant], hydrochlorothiazide , hydrocodone , and simvastatin.  MEDICATIONS:  Current Outpatient Medications  Medication Sig Dispense Refill   anastrozole  (ARIMIDEX ) 1 MG tablet Take 1 tablet (1 mg total) by mouth daily. 30 tablet 3   acyclovir  cream (ZOVIRAX ) 5 % Apply 1 Application topically 4 (four) times daily for 7 days. 28 g 0   albuterol  (VENTOLIN  HFA) 108 (90 Base) MCG/ACT inhaler USE 2 INHALATIONS BY MOUTH EVERY 6 HOURS AS NEEDED FOR WHEEZING  OR SHORTNESS OF BREATH (Patient taking differently: Inhale  2 puffs into the lungs every 6 (six) hours as needed for wheezing or shortness of breath.) 26.8 g 2   amLODipine  (NORVASC ) 10 MG tablet Take 1 tablet (10 mg total) by mouth every morning. 100 tablet 2   aspirin  EC 81 MG tablet Take 81 mg by mouth daily. Swallow whole.     Budesonide  90 MCG/ACT inhaler Inhale 1 puff into the lungs 2 (two) times daily. 1 each 0   capsaicin  (ZOSTRIX) 0.025 % cream Apply topically 2 (two) times daily. 60 g 0   cholecalciferol (VITAMIN D3) 25  MCG (1000 UNIT) tablet Take 1 tablet (1,000 Units total) by mouth daily. 7 tablet 0   Cholecalciferol (VITAMIN D3) 50 MCG (2000 UT) CHEW Chew 2,000 Units by mouth daily.     donepezil (ARICEPT) 10 MG tablet Take 10 mg by mouth at bedtime.     fluticasone  (FLONASE ) 50 MCG/ACT nasal spray USE 1 SPRAY IN BOTH NOSTRILS  DAILY (Patient taking differently: Place 1 spray into both nostrils daily as needed for allergies or rhinitis.) 16 g 0   fluticasone  (FLOVENT  HFA) 110 MCG/ACT inhaler Inhale 1 puff into the lungs daily as needed. (Patient taking differently: Inhale 1 puff into the lungs daily as needed (for flares).) 1 each 12   gabapentin  (NEURONTIN ) 300 MG capsule Take 1 capsule (300 mg total) by mouth 2 (two) times daily. 60 capsule 3   KLOR-CON  M20 20 MEQ tablet Take 1 tablet by mouth twice daily 30 tablet 0   lidocaine -prilocaine  (EMLA ) cream Apply to affected area once 30 g 3   loratadine  (EQ ALLERGY RELIEF) 10 MG tablet Take 1 tablet (10 mg total) by mouth daily. (Patient taking differently: Take 10 mg by mouth daily as needed for allergies or rhinitis.) 90 tablet 3   losartan  (COZAAR ) 50 MG tablet Take 1 tablet (50 mg total) by mouth at bedtime. 90 tablet 3   metoprolol  tartrate (LOPRESSOR ) 50 MG tablet Take 1 tablet (50 mg total) by mouth 2 (two) times daily. 200 tablet 2   ondansetron  (ZOFRAN ) 8 MG tablet Take 1 tab (8 mg) by mouth every 8 hrs as needed for nausea/vomiting. Start third day after doxorubicin /cyclophosphamide  chemotherapy. 30 tablet 1   ondansetron  (ZOFRAN -ODT) 4 MG disintegrating tablet Take 4 mg by mouth every 8 (eight) hours as needed for nausea or vomiting (dissolve orally).     prochlorperazine  (COMPAZINE ) 10 MG tablet Take 1 tablet (10 mg total) by mouth every 6 (six) hours as needed for nausea or vomiting. 30 tablet 1   rosuvastatin  (CRESTOR ) 5 MG tablet Take one tablet by mouth every Monday and Friday. (Patient taking differently: Take 5 mg by mouth See admin instructions.  Take 5 mg by mouth every Monday and Friday) 20 tablet 4   sertraline  (ZOLOFT ) 50 MG tablet TAKE 1 TABLET BY MOUTH DAILY 100 tablet 3   traMADol  (ULTRAM ) 50 MG tablet Take 1 tablet (50 mg total) by mouth every 6 (six) hours as needed for moderate pain (pain score 4-6) or severe pain (pain score 7-10). 25 tablet 0   TYLENOL  500 MG tablet Take 500-1,000 mg by mouth every 6 (six) hours as needed for mild pain (pain score 1-3) or headache.     valACYclovir  (VALTREX ) 500 MG tablet Take 1 tablet (500 mg total) by mouth 2 (two) times daily for 7 days. 14 tablet 0   No current facility-administered medications for this visit.    PHYSICAL EXAMINATION: ECOG PERFORMANCE STATUS: 1 - Symptomatic but completely  ambulatory  Vitals:   05/12/24 1131 05/12/24 1134  BP: (!) 150/70 (!) 142/76  Pulse: 91   Resp: 16   Temp: (!) 97.5 F (36.4 C)   SpO2: 97%    Wt Readings from Last 3 Encounters:  05/12/24 177 lb (80.3 kg)  05/12/24 178 lb 3.2 oz (80.8 kg)  05/06/24 175 lb (79.4 kg)     GENERAL:alert, no distress and comfortable SKIN: skin color, texture, turgor are normal, no rashes or significant lesions EYES: normal, Conjunctiva are pink and non-injected, sclera clear NECK: supple, thyroid  normal size, non-tender, without nodularity LYMPH:  no palpable lymphadenopathy in the cervical, axillary  LUNGS: clear to auscultation and percussion with normal breathing effort HEART: regular rate & rhythm and no murmurs and no lower extremity edema ABDOMEN:abdomen soft, non-tender and normal bowel sounds Musculoskeletal:no cyanosis of digits and no clubbing  NEURO: alert & oriented x 3 with fluent speech, no focal motor/sensory deficits  Physical Exam    LABORATORY DATA:  I have reviewed the data as listed    Latest Ref Rng & Units 05/12/2024   11:20 AM 03/24/2024    7:49 AM 03/17/2024   11:07 AM  CBC  WBC 4.0 - 10.5 K/uL 5.0  4.4  4.0   Hemoglobin 12.0 - 15.0 g/dL 89.7  8.9  9.8   Hematocrit 36.0 -  46.0 % 31.9  27.1  30.2   Platelets 150 - 400 K/uL 247  243  245         Latest Ref Rng & Units 05/12/2024   11:20 AM 03/24/2024    7:49 AM 03/17/2024   11:07 AM  CMP  Glucose 70 - 99 mg/dL 888  884  91   BUN 8 - 23 mg/dL 7  7  7    Creatinine 0.44 - 1.00 mg/dL 9.29  9.30  9.42   Sodium 135 - 145 mmol/L 141  140  141   Potassium 3.5 - 5.1 mmol/L 3.6  3.5  3.7   Chloride 98 - 111 mmol/L 110  110  112   CO2 22 - 32 mmol/L 27  25  26    Calcium  8.9 - 10.3 mg/dL 89.6  9.8  89.8   Total Protein 6.5 - 8.1 g/dL 6.8  6.0  6.6   Total Bilirubin 0.0 - 1.2 mg/dL 0.3  0.3  0.3   Alkaline Phos 38 - 126 U/L 91  79  85   AST 15 - 41 U/L 16  14  16    ALT 0 - 44 U/L 14  11  14        RADIOGRAPHIC STUDIES: I have personally reviewed the radiological images as listed and agreed with the findings in the report. No results found.    Orders Placed This Encounter  Procedures   DG Bone Density    Standing Status:   Future    Expected Date:   06/12/2024    Expiration Date:   05/12/2025    Reason for Exam (SYMPTOM  OR DIAGNOSIS REQUIRED):   screening    Preferred imaging location?:   [facility]   Ambulatory referral to Physical Therapy    Referral Priority:   Routine    Referral Type:   Physical Medicine    Referral Reason:   Specialty Services Required    Requested Specialty:   Physical Therapy    Number of Visits Requested:   1   All questions were answered. The patient knows to call the clinic with any problems,  questions or concerns. No barriers to learning was detected. The total time spent in the appointment was 30 minutes, including review of chart and various tests results, discussions about plan of care and coordination of care plan     Onita Mattock, MD 05/12/2024

## 2024-05-13 ENCOUNTER — Ambulatory Visit
Admission: RE | Admit: 2024-05-13 | Discharge: 2024-05-13 | Disposition: A | Discharge status: Home / Self Care | Source: Ambulatory Visit | Attending: Radiation Oncology

## 2024-05-13 ENCOUNTER — Other Ambulatory Visit: Payer: Self-pay

## 2024-05-13 ENCOUNTER — Encounter: Payer: Self-pay | Admitting: Family Medicine

## 2024-05-13 DIAGNOSIS — T451X5A Adverse effect of antineoplastic and immunosuppressive drugs, initial encounter: Secondary | ICD-10-CM | POA: Diagnosis not present

## 2024-05-13 DIAGNOSIS — Z17411 Hormone receptor positive with human epidermal growth factor receptor 2 negative status: Secondary | ICD-10-CM | POA: Diagnosis not present

## 2024-05-13 DIAGNOSIS — C50412 Malignant neoplasm of upper-outer quadrant of left female breast: Secondary | ICD-10-CM | POA: Diagnosis not present

## 2024-05-13 DIAGNOSIS — Z510 Encounter for antineoplastic radiation therapy: Secondary | ICD-10-CM | POA: Diagnosis not present

## 2024-05-13 DIAGNOSIS — Z170 Estrogen receptor positive status [ER+]: Secondary | ICD-10-CM | POA: Diagnosis not present

## 2024-05-13 DIAGNOSIS — G620 Drug-induced polyneuropathy: Secondary | ICD-10-CM | POA: Diagnosis not present

## 2024-05-13 LAB — RAD ONC ARIA SESSION SUMMARY
Course Elapsed Days: 20
Plan Fractions Treated to Date: 13
Plan Fractions Treated to Date: 13
Plan Prescribed Dose Per Fraction: 1.8 Gy
Plan Prescribed Dose Per Fraction: 1.8 Gy
Plan Total Fractions Prescribed: 28
Plan Total Fractions Prescribed: 28
Plan Total Prescribed Dose: 50.4 Gy
Plan Total Prescribed Dose: 50.4 Gy
Reference Point Dosage Given to Date: 23.4 Gy
Reference Point Dosage Given to Date: 23.4 Gy
Reference Point Session Dosage Given: 1.8 Gy
Reference Point Session Dosage Given: 1.8 Gy
Session Number: 13

## 2024-05-14 ENCOUNTER — Ambulatory Visit
Admission: RE | Admit: 2024-05-14 | Discharge: 2024-05-14 | Disposition: A | Discharge status: Home / Self Care | Source: Ambulatory Visit | Attending: Radiation Oncology | Admitting: Radiation Oncology

## 2024-05-14 ENCOUNTER — Other Ambulatory Visit: Payer: Self-pay

## 2024-05-14 DIAGNOSIS — Z510 Encounter for antineoplastic radiation therapy: Secondary | ICD-10-CM | POA: Diagnosis not present

## 2024-05-14 DIAGNOSIS — T451X5A Adverse effect of antineoplastic and immunosuppressive drugs, initial encounter: Secondary | ICD-10-CM | POA: Diagnosis not present

## 2024-05-14 DIAGNOSIS — Z170 Estrogen receptor positive status [ER+]: Secondary | ICD-10-CM | POA: Diagnosis not present

## 2024-05-14 DIAGNOSIS — C50412 Malignant neoplasm of upper-outer quadrant of left female breast: Secondary | ICD-10-CM | POA: Diagnosis not present

## 2024-05-14 DIAGNOSIS — G620 Drug-induced polyneuropathy: Secondary | ICD-10-CM | POA: Diagnosis not present

## 2024-05-14 DIAGNOSIS — Z17411 Hormone receptor positive with human epidermal growth factor receptor 2 negative status: Secondary | ICD-10-CM | POA: Diagnosis not present

## 2024-05-14 LAB — RAD ONC ARIA SESSION SUMMARY
Course Elapsed Days: 21
Plan Fractions Treated to Date: 14
Plan Fractions Treated to Date: 14
Plan Prescribed Dose Per Fraction: 1.8 Gy
Plan Prescribed Dose Per Fraction: 1.8 Gy
Plan Total Fractions Prescribed: 28
Plan Total Fractions Prescribed: 28
Plan Total Prescribed Dose: 50.4 Gy
Plan Total Prescribed Dose: 50.4 Gy
Reference Point Dosage Given to Date: 25.2 Gy
Reference Point Dosage Given to Date: 25.2 Gy
Reference Point Session Dosage Given: 1.8 Gy
Reference Point Session Dosage Given: 1.8 Gy
Session Number: 14

## 2024-05-15 ENCOUNTER — Other Ambulatory Visit: Payer: Self-pay

## 2024-05-15 ENCOUNTER — Ambulatory Visit
Admission: RE | Admit: 2024-05-15 | Discharge: 2024-05-15 | Disposition: A | Discharge status: Home / Self Care | Source: Ambulatory Visit | Attending: Radiation Oncology | Admitting: Radiation Oncology

## 2024-05-15 DIAGNOSIS — T451X5A Adverse effect of antineoplastic and immunosuppressive drugs, initial encounter: Secondary | ICD-10-CM | POA: Diagnosis not present

## 2024-05-15 DIAGNOSIS — Z510 Encounter for antineoplastic radiation therapy: Secondary | ICD-10-CM | POA: Diagnosis not present

## 2024-05-15 DIAGNOSIS — C50412 Malignant neoplasm of upper-outer quadrant of left female breast: Secondary | ICD-10-CM | POA: Diagnosis not present

## 2024-05-15 DIAGNOSIS — G620 Drug-induced polyneuropathy: Secondary | ICD-10-CM | POA: Diagnosis not present

## 2024-05-15 DIAGNOSIS — Z170 Estrogen receptor positive status [ER+]: Secondary | ICD-10-CM | POA: Diagnosis not present

## 2024-05-15 DIAGNOSIS — Z17411 Hormone receptor positive with human epidermal growth factor receptor 2 negative status: Secondary | ICD-10-CM | POA: Diagnosis not present

## 2024-05-15 LAB — RAD ONC ARIA SESSION SUMMARY
Course Elapsed Days: 22
Plan Fractions Treated to Date: 15
Plan Fractions Treated to Date: 15
Plan Prescribed Dose Per Fraction: 1.8 Gy
Plan Prescribed Dose Per Fraction: 1.8 Gy
Plan Total Fractions Prescribed: 28
Plan Total Fractions Prescribed: 28
Plan Total Prescribed Dose: 50.4 Gy
Plan Total Prescribed Dose: 50.4 Gy
Reference Point Dosage Given to Date: 27 Gy
Reference Point Dosage Given to Date: 27 Gy
Reference Point Session Dosage Given: 1.8 Gy
Reference Point Session Dosage Given: 1.8 Gy
Session Number: 15

## 2024-05-15 NOTE — Telephone Encounter (Signed)
 Form placed up front for pick up.   Copy made for batch scanning.   Mychart message sent to patients daughter.

## 2024-05-16 ENCOUNTER — Ambulatory Visit
Admission: RE | Admit: 2024-05-16 | Discharge: 2024-05-16 | Disposition: A | Discharge status: Home / Self Care | Source: Ambulatory Visit | Attending: Radiation Oncology | Admitting: Radiation Oncology

## 2024-05-16 ENCOUNTER — Other Ambulatory Visit: Payer: Self-pay

## 2024-05-16 DIAGNOSIS — C50412 Malignant neoplasm of upper-outer quadrant of left female breast: Secondary | ICD-10-CM | POA: Diagnosis not present

## 2024-05-16 DIAGNOSIS — Z170 Estrogen receptor positive status [ER+]: Secondary | ICD-10-CM | POA: Diagnosis not present

## 2024-05-16 DIAGNOSIS — Z510 Encounter for antineoplastic radiation therapy: Secondary | ICD-10-CM | POA: Diagnosis not present

## 2024-05-16 LAB — RAD ONC ARIA SESSION SUMMARY
Course Elapsed Days: 23
Plan Fractions Treated to Date: 16
Plan Fractions Treated to Date: 16
Plan Prescribed Dose Per Fraction: 1.8 Gy
Plan Prescribed Dose Per Fraction: 1.8 Gy
Plan Total Fractions Prescribed: 28
Plan Total Fractions Prescribed: 28
Plan Total Prescribed Dose: 50.4 Gy
Plan Total Prescribed Dose: 50.4 Gy
Reference Point Dosage Given to Date: 28.8 Gy
Reference Point Dosage Given to Date: 28.8 Gy
Reference Point Session Dosage Given: 1.8 Gy
Reference Point Session Dosage Given: 1.8 Gy
Session Number: 16

## 2024-05-19 ENCOUNTER — Other Ambulatory Visit: Payer: Self-pay | Admitting: *Deleted

## 2024-05-19 ENCOUNTER — Ambulatory Visit
Admission: RE | Admit: 2024-05-19 | Discharge: 2024-05-19 | Disposition: A | Discharge status: Home / Self Care | Source: Ambulatory Visit | Attending: Radiation Oncology

## 2024-05-19 DIAGNOSIS — Z510 Encounter for antineoplastic radiation therapy: Secondary | ICD-10-CM | POA: Diagnosis not present

## 2024-05-19 DIAGNOSIS — C50412 Malignant neoplasm of upper-outer quadrant of left female breast: Secondary | ICD-10-CM | POA: Diagnosis not present

## 2024-05-19 DIAGNOSIS — Z170 Estrogen receptor positive status [ER+]: Secondary | ICD-10-CM | POA: Diagnosis not present

## 2024-05-19 MED ORDER — ROSUVASTATIN CALCIUM 5 MG PO TABS: ORAL_TABLET | ORAL | 0 refills | Status: DC

## 2024-05-20 ENCOUNTER — Ambulatory Visit
Admission: RE | Admit: 2024-05-20 | Discharge: 2024-05-20 | Disposition: A | Discharge status: Home / Self Care | Source: Ambulatory Visit | Attending: Radiation Oncology

## 2024-05-20 ENCOUNTER — Other Ambulatory Visit: Payer: Self-pay

## 2024-05-20 DIAGNOSIS — C50412 Malignant neoplasm of upper-outer quadrant of left female breast: Secondary | ICD-10-CM | POA: Diagnosis not present

## 2024-05-20 DIAGNOSIS — Z510 Encounter for antineoplastic radiation therapy: Secondary | ICD-10-CM | POA: Diagnosis not present

## 2024-05-20 DIAGNOSIS — Z170 Estrogen receptor positive status [ER+]: Secondary | ICD-10-CM | POA: Diagnosis not present

## 2024-05-20 LAB — RAD ONC ARIA SESSION SUMMARY
Course Elapsed Days: 27
Plan Fractions Treated to Date: 18
Plan Fractions Treated to Date: 18
Plan Prescribed Dose Per Fraction: 1.8 Gy
Plan Prescribed Dose Per Fraction: 1.8 Gy
Plan Total Fractions Prescribed: 28
Plan Total Fractions Prescribed: 28
Plan Total Prescribed Dose: 50.4 Gy
Plan Total Prescribed Dose: 50.4 Gy
Reference Point Dosage Given to Date: 32.4 Gy
Reference Point Dosage Given to Date: 32.4 Gy
Reference Point Session Dosage Given: 1.8 Gy
Reference Point Session Dosage Given: 1.8 Gy
Session Number: 18

## 2024-05-21 ENCOUNTER — Ambulatory Visit
Admission: RE | Admit: 2024-05-21 | Discharge: 2024-05-21 | Disposition: A | Discharge status: Home / Self Care | Source: Ambulatory Visit | Attending: Radiation Oncology | Admitting: Radiation Oncology

## 2024-05-21 ENCOUNTER — Encounter: Payer: Self-pay | Admitting: Radiation Oncology

## 2024-05-21 ENCOUNTER — Other Ambulatory Visit: Payer: Self-pay

## 2024-05-21 DIAGNOSIS — Z510 Encounter for antineoplastic radiation therapy: Secondary | ICD-10-CM | POA: Diagnosis not present

## 2024-05-21 DIAGNOSIS — Z170 Estrogen receptor positive status [ER+]: Secondary | ICD-10-CM | POA: Diagnosis not present

## 2024-05-21 DIAGNOSIS — C50412 Malignant neoplasm of upper-outer quadrant of left female breast: Secondary | ICD-10-CM | POA: Diagnosis not present

## 2024-05-21 LAB — RAD ONC ARIA SESSION SUMMARY
Course Elapsed Days: 28
Plan Fractions Treated to Date: 19
Plan Fractions Treated to Date: 19
Plan Prescribed Dose Per Fraction: 1.8 Gy
Plan Prescribed Dose Per Fraction: 1.8 Gy
Plan Total Fractions Prescribed: 28
Plan Total Fractions Prescribed: 28
Plan Total Prescribed Dose: 50.4 Gy
Plan Total Prescribed Dose: 50.4 Gy
Reference Point Dosage Given to Date: 34.2 Gy
Reference Point Dosage Given to Date: 34.2 Gy
Reference Point Session Dosage Given: 1.8 Gy
Reference Point Session Dosage Given: 1.8 Gy
Session Number: 19

## 2024-05-22 ENCOUNTER — Ambulatory Visit
Admission: RE | Admit: 2024-05-22 | Discharge: 2024-05-22 | Disposition: A | Discharge status: Home / Self Care | Source: Ambulatory Visit | Attending: Radiation Oncology | Admitting: Radiation Oncology

## 2024-05-22 ENCOUNTER — Ambulatory Visit

## 2024-05-22 ENCOUNTER — Other Ambulatory Visit: Payer: Self-pay

## 2024-05-22 DIAGNOSIS — C50412 Malignant neoplasm of upper-outer quadrant of left female breast: Secondary | ICD-10-CM | POA: Diagnosis not present

## 2024-05-22 DIAGNOSIS — Z170 Estrogen receptor positive status [ER+]: Secondary | ICD-10-CM | POA: Diagnosis not present

## 2024-05-22 DIAGNOSIS — Z510 Encounter for antineoplastic radiation therapy: Secondary | ICD-10-CM | POA: Diagnosis not present

## 2024-05-22 LAB — RAD ONC ARIA SESSION SUMMARY
Course Elapsed Days: 29
Plan Fractions Treated to Date: 20
Plan Fractions Treated to Date: 20
Plan Prescribed Dose Per Fraction: 1.8 Gy
Plan Prescribed Dose Per Fraction: 1.8 Gy
Plan Total Fractions Prescribed: 28
Plan Total Fractions Prescribed: 28
Plan Total Prescribed Dose: 50.4 Gy
Plan Total Prescribed Dose: 50.4 Gy
Reference Point Dosage Given to Date: 36 Gy
Reference Point Dosage Given to Date: 36 Gy
Reference Point Session Dosage Given: 1.8 Gy
Reference Point Session Dosage Given: 1.8 Gy
Session Number: 20

## 2024-05-23 ENCOUNTER — Ambulatory Visit
Admission: RE | Admit: 2024-05-23 | Discharge: 2024-05-23 | Disposition: A | Discharge status: Home / Self Care | Source: Ambulatory Visit | Attending: Radiation Oncology | Admitting: Radiation Oncology

## 2024-05-23 ENCOUNTER — Ambulatory Visit

## 2024-05-23 ENCOUNTER — Encounter: Payer: Self-pay | Admitting: Hematology

## 2024-05-23 ENCOUNTER — Other Ambulatory Visit: Payer: Self-pay

## 2024-05-23 DIAGNOSIS — C50412 Malignant neoplasm of upper-outer quadrant of left female breast: Secondary | ICD-10-CM | POA: Diagnosis not present

## 2024-05-23 DIAGNOSIS — Z510 Encounter for antineoplastic radiation therapy: Secondary | ICD-10-CM | POA: Diagnosis not present

## 2024-05-23 DIAGNOSIS — Z170 Estrogen receptor positive status [ER+]: Secondary | ICD-10-CM | POA: Diagnosis not present

## 2024-05-23 LAB — RAD ONC ARIA SESSION SUMMARY
Course Elapsed Days: 30
Plan Fractions Treated to Date: 21
Plan Fractions Treated to Date: 21
Plan Prescribed Dose Per Fraction: 1.8 Gy
Plan Prescribed Dose Per Fraction: 1.8 Gy
Plan Total Fractions Prescribed: 28
Plan Total Fractions Prescribed: 28
Plan Total Prescribed Dose: 50.4 Gy
Plan Total Prescribed Dose: 50.4 Gy
Reference Point Dosage Given to Date: 37.8 Gy
Reference Point Dosage Given to Date: 37.8 Gy
Reference Point Session Dosage Given: 1.8 Gy
Reference Point Session Dosage Given: 1.8 Gy
Session Number: 21

## 2024-05-26 ENCOUNTER — Other Ambulatory Visit: Payer: Self-pay

## 2024-05-26 ENCOUNTER — Ambulatory Visit
Admission: RE | Admit: 2024-05-26 | Discharge: 2024-05-26 | Disposition: A | Discharge status: Home / Self Care | Source: Ambulatory Visit | Attending: Radiation Oncology | Admitting: Radiation Oncology

## 2024-05-26 DIAGNOSIS — Z170 Estrogen receptor positive status [ER+]: Secondary | ICD-10-CM | POA: Diagnosis not present

## 2024-05-26 DIAGNOSIS — Z510 Encounter for antineoplastic radiation therapy: Secondary | ICD-10-CM | POA: Diagnosis not present

## 2024-05-26 DIAGNOSIS — C50412 Malignant neoplasm of upper-outer quadrant of left female breast: Secondary | ICD-10-CM | POA: Diagnosis not present

## 2024-05-26 LAB — RAD ONC ARIA SESSION SUMMARY
Course Elapsed Days: 33
Plan Fractions Treated to Date: 22
Plan Fractions Treated to Date: 22
Plan Prescribed Dose Per Fraction: 1.8 Gy
Plan Prescribed Dose Per Fraction: 1.8 Gy
Plan Total Fractions Prescribed: 28
Plan Total Fractions Prescribed: 28
Plan Total Prescribed Dose: 50.4 Gy
Plan Total Prescribed Dose: 50.4 Gy
Reference Point Dosage Given to Date: 39.6 Gy
Reference Point Dosage Given to Date: 39.6 Gy
Reference Point Session Dosage Given: 1.8 Gy
Reference Point Session Dosage Given: 1.8 Gy
Session Number: 22

## 2024-05-27 ENCOUNTER — Ambulatory Visit
Admission: RE | Admit: 2024-05-27 | Discharge: 2024-05-27 | Disposition: A | Discharge status: Home / Self Care | Source: Ambulatory Visit | Attending: Radiation Oncology

## 2024-05-27 ENCOUNTER — Other Ambulatory Visit: Payer: Self-pay

## 2024-05-27 DIAGNOSIS — Z170 Estrogen receptor positive status [ER+]: Secondary | ICD-10-CM | POA: Diagnosis not present

## 2024-05-27 DIAGNOSIS — Z510 Encounter for antineoplastic radiation therapy: Secondary | ICD-10-CM | POA: Diagnosis not present

## 2024-05-27 DIAGNOSIS — C50412 Malignant neoplasm of upper-outer quadrant of left female breast: Secondary | ICD-10-CM | POA: Diagnosis not present

## 2024-05-27 LAB — RAD ONC ARIA SESSION SUMMARY
Course Elapsed Days: 34
Plan Fractions Treated to Date: 23
Plan Fractions Treated to Date: 23
Plan Prescribed Dose Per Fraction: 1.8 Gy
Plan Prescribed Dose Per Fraction: 1.8 Gy
Plan Total Fractions Prescribed: 28
Plan Total Fractions Prescribed: 28
Plan Total Prescribed Dose: 50.4 Gy
Plan Total Prescribed Dose: 50.4 Gy
Reference Point Dosage Given to Date: 41.4 Gy
Reference Point Dosage Given to Date: 41.4 Gy
Reference Point Session Dosage Given: 1.8 Gy
Reference Point Session Dosage Given: 1.8 Gy
Session Number: 23

## 2024-05-28 ENCOUNTER — Other Ambulatory Visit: Payer: Self-pay

## 2024-05-28 ENCOUNTER — Ambulatory Visit
Admission: RE | Admit: 2024-05-28 | Discharge: 2024-05-28 | Disposition: A | Discharge status: Home / Self Care | Source: Ambulatory Visit | Attending: Radiation Oncology | Admitting: Radiation Oncology

## 2024-05-28 DIAGNOSIS — Z510 Encounter for antineoplastic radiation therapy: Secondary | ICD-10-CM | POA: Diagnosis not present

## 2024-05-28 DIAGNOSIS — C50412 Malignant neoplasm of upper-outer quadrant of left female breast: Secondary | ICD-10-CM | POA: Diagnosis not present

## 2024-05-28 DIAGNOSIS — Z170 Estrogen receptor positive status [ER+]: Secondary | ICD-10-CM | POA: Diagnosis not present

## 2024-05-28 LAB — RAD ONC ARIA SESSION SUMMARY
Course Elapsed Days: 35
Plan Fractions Treated to Date: 24
Plan Fractions Treated to Date: 24
Plan Prescribed Dose Per Fraction: 1.8 Gy
Plan Prescribed Dose Per Fraction: 1.8 Gy
Plan Total Fractions Prescribed: 28
Plan Total Fractions Prescribed: 28
Plan Total Prescribed Dose: 50.4 Gy
Plan Total Prescribed Dose: 50.4 Gy
Reference Point Dosage Given to Date: 43.2 Gy
Reference Point Dosage Given to Date: 43.2 Gy
Reference Point Session Dosage Given: 1.8 Gy
Reference Point Session Dosage Given: 1.8 Gy
Session Number: 24

## 2024-05-29 ENCOUNTER — Telehealth: Payer: Self-pay | Admitting: Nurse Practitioner

## 2024-05-29 ENCOUNTER — Other Ambulatory Visit: Payer: Self-pay

## 2024-05-29 ENCOUNTER — Ambulatory Visit
Admission: RE | Admit: 2024-05-29 | Discharge: 2024-05-29 | Disposition: A | Discharge status: Home / Self Care | Source: Ambulatory Visit | Attending: Radiation Oncology | Admitting: Radiation Oncology

## 2024-05-29 DIAGNOSIS — Z170 Estrogen receptor positive status [ER+]: Secondary | ICD-10-CM | POA: Diagnosis not present

## 2024-05-29 DIAGNOSIS — C50412 Malignant neoplasm of upper-outer quadrant of left female breast: Secondary | ICD-10-CM | POA: Diagnosis not present

## 2024-05-29 DIAGNOSIS — Z510 Encounter for antineoplastic radiation therapy: Secondary | ICD-10-CM | POA: Diagnosis not present

## 2024-05-29 LAB — RAD ONC ARIA SESSION SUMMARY
Course Elapsed Days: 36
Plan Fractions Treated to Date: 25
Plan Fractions Treated to Date: 25
Plan Prescribed Dose Per Fraction: 1.8 Gy
Plan Prescribed Dose Per Fraction: 1.8 Gy
Plan Total Fractions Prescribed: 28
Plan Total Fractions Prescribed: 28
Plan Total Prescribed Dose: 50.4 Gy
Plan Total Prescribed Dose: 50.4 Gy
Reference Point Dosage Given to Date: 45 Gy
Reference Point Dosage Given to Date: 45 Gy
Reference Point Session Dosage Given: 1.8 Gy
Reference Point Session Dosage Given: 1.8 Gy
Session Number: 25

## 2024-05-29 NOTE — Telephone Encounter (Signed)
 Called  and spoke with PT and she is aware of the upcoming appt.

## 2024-05-30 ENCOUNTER — Other Ambulatory Visit: Payer: Self-pay

## 2024-05-30 ENCOUNTER — Ambulatory Visit
Admission: RE | Admit: 2024-05-30 | Discharge: 2024-05-30 | Disposition: A | Discharge status: Home / Self Care | Source: Ambulatory Visit | Attending: Radiation Oncology | Admitting: Radiation Oncology

## 2024-05-30 ENCOUNTER — Ambulatory Visit: Admitting: Radiation Oncology

## 2024-05-30 DIAGNOSIS — Z170 Estrogen receptor positive status [ER+]: Secondary | ICD-10-CM | POA: Diagnosis not present

## 2024-05-30 DIAGNOSIS — Z510 Encounter for antineoplastic radiation therapy: Secondary | ICD-10-CM | POA: Diagnosis not present

## 2024-05-30 DIAGNOSIS — C50412 Malignant neoplasm of upper-outer quadrant of left female breast: Secondary | ICD-10-CM | POA: Diagnosis not present

## 2024-05-30 LAB — RAD ONC ARIA SESSION SUMMARY
Course Elapsed Days: 37
Plan Fractions Treated to Date: 26
Plan Fractions Treated to Date: 26
Plan Prescribed Dose Per Fraction: 1.8 Gy
Plan Prescribed Dose Per Fraction: 1.8 Gy
Plan Total Fractions Prescribed: 28
Plan Total Fractions Prescribed: 28
Plan Total Prescribed Dose: 50.4 Gy
Plan Total Prescribed Dose: 50.4 Gy
Reference Point Dosage Given to Date: 46.8 Gy
Reference Point Dosage Given to Date: 46.8 Gy
Reference Point Session Dosage Given: 1.8 Gy
Reference Point Session Dosage Given: 1.8 Gy
Session Number: 26

## 2024-05-31 ENCOUNTER — Other Ambulatory Visit: Payer: Self-pay | Admitting: Family Medicine

## 2024-05-31 DIAGNOSIS — L080 Pyoderma: Secondary | ICD-10-CM

## 2024-06-02 ENCOUNTER — Ambulatory Visit

## 2024-06-02 ENCOUNTER — Other Ambulatory Visit: Payer: Self-pay

## 2024-06-02 ENCOUNTER — Ambulatory Visit
Admission: RE | Admit: 2024-06-02 | Discharge: 2024-06-02 | Disposition: A | Discharge status: Home / Self Care | Source: Ambulatory Visit | Attending: Radiation Oncology | Admitting: Radiation Oncology

## 2024-06-02 DIAGNOSIS — Z510 Encounter for antineoplastic radiation therapy: Secondary | ICD-10-CM | POA: Diagnosis not present

## 2024-06-02 DIAGNOSIS — Z170 Estrogen receptor positive status [ER+]: Secondary | ICD-10-CM | POA: Diagnosis not present

## 2024-06-02 DIAGNOSIS — C50412 Malignant neoplasm of upper-outer quadrant of left female breast: Secondary | ICD-10-CM

## 2024-06-02 LAB — RAD ONC ARIA SESSION SUMMARY
Course Elapsed Days: 40
Plan Fractions Treated to Date: 27
Plan Fractions Treated to Date: 27
Plan Prescribed Dose Per Fraction: 1.8 Gy
Plan Prescribed Dose Per Fraction: 1.8 Gy
Plan Total Fractions Prescribed: 28
Plan Total Fractions Prescribed: 28
Plan Total Prescribed Dose: 50.4 Gy
Plan Total Prescribed Dose: 50.4 Gy
Reference Point Dosage Given to Date: 48.6 Gy
Reference Point Dosage Given to Date: 48.6 Gy
Reference Point Session Dosage Given: 1.8 Gy
Reference Point Session Dosage Given: 1.8 Gy
Session Number: 27

## 2024-06-02 MED ORDER — SILVER SULFADIAZINE 1 % EX CREA: TOPICAL_CREAM | Freq: Every day | CUTANEOUS | Status: DC

## 2024-06-02 NOTE — Progress Notes (Signed)
 Patient was seen today for concerns of skin breakdown within the treatment field. She reports using Radiaplex once/day. She denies any fever, chills, or breast pain. On physical exam there is an approximate 2.5 cm area of skin breakdown with moist desquamation within the inframammary fold. Skin is hyperpigmented throughout the rest of the treatment field.   Recommend Silvadene  BID to the area of skin breakdown and Radiaplex BID to the rest of the treatment field. She denies a sulfa  allergy. She was given dressings to keep the area covered. Recommend Tylenol  if patient develops pain in the meantime. She will see Dr. Dewey for her PUT visit on 06/06/2024.     Leeroy Due, PA-C

## 2024-06-03 ENCOUNTER — Ambulatory Visit

## 2024-06-03 ENCOUNTER — Other Ambulatory Visit: Payer: Self-pay

## 2024-06-03 DIAGNOSIS — Z510 Encounter for antineoplastic radiation therapy: Secondary | ICD-10-CM | POA: Diagnosis not present

## 2024-06-03 DIAGNOSIS — Z170 Estrogen receptor positive status [ER+]: Secondary | ICD-10-CM | POA: Diagnosis not present

## 2024-06-03 DIAGNOSIS — C50412 Malignant neoplasm of upper-outer quadrant of left female breast: Secondary | ICD-10-CM | POA: Diagnosis not present

## 2024-06-03 LAB — RAD ONC ARIA SESSION SUMMARY
Course Elapsed Days: 41
Plan Fractions Treated to Date: 28
Plan Fractions Treated to Date: 28
Plan Prescribed Dose Per Fraction: 1.8 Gy
Plan Prescribed Dose Per Fraction: 1.8 Gy
Plan Total Fractions Prescribed: 28
Plan Total Fractions Prescribed: 28
Plan Total Prescribed Dose: 50.4 Gy
Plan Total Prescribed Dose: 50.4 Gy
Reference Point Dosage Given to Date: 50.4 Gy
Reference Point Dosage Given to Date: 50.4 Gy
Reference Point Session Dosage Given: 1.8 Gy
Reference Point Session Dosage Given: 1.8 Gy
Session Number: 28

## 2024-06-04 ENCOUNTER — Other Ambulatory Visit: Payer: Self-pay

## 2024-06-04 ENCOUNTER — Ambulatory Visit

## 2024-06-04 DIAGNOSIS — C50412 Malignant neoplasm of upper-outer quadrant of left female breast: Secondary | ICD-10-CM | POA: Diagnosis not present

## 2024-06-04 DIAGNOSIS — Z170 Estrogen receptor positive status [ER+]: Secondary | ICD-10-CM | POA: Diagnosis not present

## 2024-06-04 DIAGNOSIS — Z510 Encounter for antineoplastic radiation therapy: Secondary | ICD-10-CM | POA: Diagnosis not present

## 2024-06-04 LAB — RAD ONC ARIA SESSION SUMMARY
Course Elapsed Days: 42
Plan Fractions Treated to Date: 1
Plan Prescribed Dose Per Fraction: 2 Gy
Plan Total Fractions Prescribed: 5
Plan Total Prescribed Dose: 10 Gy
Reference Point Dosage Given to Date: 2 Gy
Reference Point Session Dosage Given: 2 Gy
Session Number: 29

## 2024-06-05 ENCOUNTER — Other Ambulatory Visit: Payer: Self-pay

## 2024-06-05 ENCOUNTER — Ambulatory Visit
Admission: RE | Admit: 2024-06-05 | Discharge: 2024-06-05 | Disposition: A | Discharge status: Home / Self Care | Source: Ambulatory Visit | Attending: Radiation Oncology | Admitting: Radiation Oncology

## 2024-06-05 ENCOUNTER — Ambulatory Visit

## 2024-06-05 DIAGNOSIS — Z510 Encounter for antineoplastic radiation therapy: Secondary | ICD-10-CM | POA: Diagnosis not present

## 2024-06-05 DIAGNOSIS — Z170 Estrogen receptor positive status [ER+]: Secondary | ICD-10-CM | POA: Diagnosis not present

## 2024-06-05 DIAGNOSIS — C50412 Malignant neoplasm of upper-outer quadrant of left female breast: Secondary | ICD-10-CM | POA: Diagnosis not present

## 2024-06-05 LAB — RAD ONC ARIA SESSION SUMMARY
Course Elapsed Days: 43
Plan Fractions Treated to Date: 2
Plan Prescribed Dose Per Fraction: 2 Gy
Plan Total Fractions Prescribed: 5
Plan Total Prescribed Dose: 10 Gy
Reference Point Dosage Given to Date: 4 Gy
Reference Point Session Dosage Given: 2 Gy
Session Number: 30

## 2024-06-06 ENCOUNTER — Ambulatory Visit

## 2024-06-06 ENCOUNTER — Ambulatory Visit
Admission: RE | Admit: 2024-06-06 | Discharge: 2024-06-06 | Disposition: A | Discharge status: Home / Self Care | Source: Ambulatory Visit | Attending: Radiation Oncology | Admitting: Radiation Oncology

## 2024-06-06 ENCOUNTER — Other Ambulatory Visit: Payer: Self-pay

## 2024-06-06 DIAGNOSIS — C50412 Malignant neoplasm of upper-outer quadrant of left female breast: Secondary | ICD-10-CM | POA: Diagnosis not present

## 2024-06-06 DIAGNOSIS — Z170 Estrogen receptor positive status [ER+]: Secondary | ICD-10-CM | POA: Diagnosis not present

## 2024-06-06 LAB — RAD ONC ARIA SESSION SUMMARY
Course Elapsed Days: 44
Plan Fractions Treated to Date: 3
Plan Prescribed Dose Per Fraction: 2 Gy
Plan Total Fractions Prescribed: 5
Plan Total Prescribed Dose: 10 Gy
Reference Point Dosage Given to Date: 6 Gy
Reference Point Session Dosage Given: 2 Gy
Session Number: 31

## 2024-06-09 ENCOUNTER — Inpatient Hospital Stay

## 2024-06-09 ENCOUNTER — Other Ambulatory Visit: Payer: Self-pay

## 2024-06-09 ENCOUNTER — Ambulatory Visit
Admission: RE | Admit: 2024-06-09 | Discharge: 2024-06-09 | Disposition: A | Discharge status: Home / Self Care | Source: Ambulatory Visit | Attending: Radiation Oncology

## 2024-06-09 ENCOUNTER — Ambulatory Visit

## 2024-06-09 DIAGNOSIS — C773 Secondary and unspecified malignant neoplasm of axilla and upper limb lymph nodes: Secondary | ICD-10-CM | POA: Insufficient documentation

## 2024-06-09 DIAGNOSIS — Z79811 Long term (current) use of aromatase inhibitors: Secondary | ICD-10-CM | POA: Insufficient documentation

## 2024-06-09 DIAGNOSIS — Z1732 Human epidermal growth factor receptor 2 negative status: Secondary | ICD-10-CM | POA: Insufficient documentation

## 2024-06-09 DIAGNOSIS — C50912 Malignant neoplasm of unspecified site of left female breast: Secondary | ICD-10-CM

## 2024-06-09 DIAGNOSIS — C50412 Malignant neoplasm of upper-outer quadrant of left female breast: Secondary | ICD-10-CM | POA: Insufficient documentation

## 2024-06-09 DIAGNOSIS — Z1721 Progesterone receptor positive status: Secondary | ICD-10-CM | POA: Insufficient documentation

## 2024-06-09 DIAGNOSIS — Z170 Estrogen receptor positive status [ER+]: Secondary | ICD-10-CM | POA: Insufficient documentation

## 2024-06-09 LAB — CBC WITH DIFFERENTIAL (CANCER CENTER ONLY)
Abs Immature Granulocytes: 0.01 K/uL (ref 0.00–0.07)
Basophils Absolute: 0 K/uL (ref 0.0–0.1)
Basophils Relative: 1 %
Eosinophils Absolute: 0.2 K/uL (ref 0.0–0.5)
Eosinophils Relative: 5 %
HCT: 32.8 % — ABNORMAL LOW (ref 36.0–46.0)
Hemoglobin: 10.5 g/dL — ABNORMAL LOW (ref 12.0–15.0)
Immature Granulocytes: 0 %
Lymphocytes Relative: 18 %
Lymphs Abs: 0.8 K/uL (ref 0.7–4.0)
MCH: 29.1 pg (ref 26.0–34.0)
MCHC: 32 g/dL (ref 30.0–36.0)
MCV: 90.9 fL (ref 80.0–100.0)
Monocytes Absolute: 0.4 K/uL (ref 0.1–1.0)
Monocytes Relative: 10 %
Neutro Abs: 2.8 K/uL (ref 1.7–7.7)
Neutrophils Relative %: 66 %
Platelet Count: 239 K/uL (ref 150–400)
RBC: 3.61 MIL/uL — ABNORMAL LOW (ref 3.87–5.11)
RDW: 13.1 % (ref 11.5–15.5)
WBC Count: 4.3 K/uL (ref 4.0–10.5)
nRBC: 0 % (ref 0.0–0.2)

## 2024-06-09 LAB — RAD ONC ARIA SESSION SUMMARY
Course Elapsed Days: 47
Plan Fractions Treated to Date: 4
Plan Prescribed Dose Per Fraction: 2 Gy
Plan Total Fractions Prescribed: 5
Plan Total Prescribed Dose: 10 Gy
Reference Point Dosage Given to Date: 8 Gy
Reference Point Session Dosage Given: 2 Gy
Session Number: 32

## 2024-06-09 LAB — CMP (CANCER CENTER ONLY)
ALT: 9 U/L (ref 0–44)
AST: 12 U/L — ABNORMAL LOW (ref 15–41)
Albumin: 3.8 g/dL (ref 3.5–5.0)
Alkaline Phosphatase: 92 U/L (ref 38–126)
Anion gap: 5 (ref 5–15)
BUN: 10 mg/dL (ref 8–23)
CO2: 27 mmol/L (ref 22–32)
Calcium: 10.4 mg/dL — ABNORMAL HIGH (ref 8.9–10.3)
Chloride: 110 mmol/L (ref 98–111)
Creatinine: 0.62 mg/dL (ref 0.44–1.00)
GFR, Estimated: >60 mL/min (ref >60)
Glucose, Bld: 106 mg/dL — ABNORMAL HIGH (ref 70–99)
Potassium: 3.4 mmol/L — ABNORMAL LOW (ref 3.5–5.1)
Sodium: 142 mmol/L (ref 135–145)
Total Bilirubin: 0.3 mg/dL (ref 0.0–1.2)
Total Protein: 6.4 g/dL — ABNORMAL LOW (ref 6.5–8.1)

## 2024-06-10 ENCOUNTER — Ambulatory Visit
Admission: RE | Admit: 2024-06-10 | Discharge: 2024-06-10 | Disposition: A | Discharge status: Home / Self Care | Source: Ambulatory Visit | Attending: Radiation Oncology | Admitting: Radiation Oncology

## 2024-06-10 ENCOUNTER — Other Ambulatory Visit: Payer: Self-pay

## 2024-06-10 DIAGNOSIS — C50412 Malignant neoplasm of upper-outer quadrant of left female breast: Secondary | ICD-10-CM | POA: Diagnosis not present

## 2024-06-10 DIAGNOSIS — Z170 Estrogen receptor positive status [ER+]: Secondary | ICD-10-CM | POA: Diagnosis not present

## 2024-06-10 DIAGNOSIS — Z510 Encounter for antineoplastic radiation therapy: Secondary | ICD-10-CM | POA: Diagnosis not present

## 2024-06-10 LAB — RAD ONC ARIA SESSION SUMMARY
Course Elapsed Days: 48
Plan Fractions Treated to Date: 5
Plan Prescribed Dose Per Fraction: 2 Gy
Plan Total Fractions Prescribed: 5
Plan Total Prescribed Dose: 10 Gy
Reference Point Dosage Given to Date: 10 Gy
Reference Point Session Dosage Given: 2 Gy
Session Number: 33

## 2024-06-12 NOTE — Radiation Completion Notes (Addendum)
  Radiation Oncology         [phone] ________________________________  Name: [name] MRN: [id number]  Date of Service: 06/10/2024  DOB: Jan 24, 1956  End of Treatment Note   Diagnosis: Stage IIIA, pT2N3aM0, grade 2 ER/PR positive invasive ductal carcinoma of the left breast   Intent: Curative     ==========DELIVERED PLANS==========  First Treatment Date: 2024-04-23 Last Treatment Date: 2024-06-10   Plan Name: Breast_L_BH Site: Breast, Left Technique: 3D Mode: Photon Dose Per Fraction: 1.8 Gy Prescribed Dose (Delivered / Prescribed): 50.4 Gy / 50.4 Gy Prescribed Fxs (Delivered / Prescribed): 28 / 28   Plan Name: SCV_L_SCV_BH Site: Sclav-LT Technique: 3D Mode: Photon Dose Per Fraction: 1.8 Gy Prescribed Dose (Delivered / Prescribed): 50.4 Gy / 50.4 Gy Prescribed Fxs (Delivered / Prescribed): 28 / 28   Plan Name: Brst_L_Bst_BH Site: Breast, Left Technique: 3D Mode: Photon Dose Per Fraction: 2 Gy Prescribed Dose (Delivered / Prescribed): 10 Gy / 10 Gy Prescribed Fxs (Delivered / Prescribed): 5 / 5     ==========ON TREATMENT VISIT DATES========== 2024-04-25, 2024-05-02, 2024-05-13, 2024-05-16, 2024-05-27, 2024-05-30, 2024-06-06    See weekly On Treatment Notes in Epic for details in the Media tab (listed as Progress notes on the On Treatment Visit Dates listed above). The patient tolerated radiation. She developed fatigue and anticipated skin changes in the treatment field.   The patient will receive a call in about one month from the radiation oncology department. She will continue follow up with Dr. Lanny as well.      Donald KYM Husband, PAC

## 2024-06-22 ENCOUNTER — Other Ambulatory Visit: Payer: Self-pay | Admitting: Hematology

## 2024-07-11 NOTE — Progress Notes (Incomplete)
  Radiation Oncology         [phone] ________________________________  Name: [name] MRN: [id number]  Date of Service: 07/14/2024  DOB: [birth year]  Post Treatment Telephone Note  Diagnosis:  Stage IIIA, pT2N3aM0, grade 2 ER/PR positive invasive ductal carcinoma of the left breast   Intent: Curative   The patient {WAS/WAS NOT:[phone]::was not} available for call today.   Symptoms of fatigue {ACTIONS; HAVE/HAVE NOT:19434} improved since completing therapy.  Symptoms of skin changes {ACTIONS; HAVE/HAVE NOT:19434} improved since completing therapy.  The patient was encouraged to avoid sun exposure in the area of prior treatment for up to one year following radiation with either sunscreen or by the style of clothing worn in the sun.  The patient has scheduled follow up with her medical oncologist Dr. Lanny for ongoing surveillance, and was encouraged to call if she develops concerns or questions regarding radiation.

## 2024-07-14 ENCOUNTER — Ambulatory Visit
Admission: RE | Admit: 2024-07-14 | Discharge: 2024-07-14 | Disposition: A | Discharge status: Home / Self Care | Source: Ambulatory Visit | Attending: Hematology | Admitting: Hematology

## 2024-07-14 DIAGNOSIS — C50412 Malignant neoplasm of upper-outer quadrant of left female breast: Secondary | ICD-10-CM

## 2024-07-14 NOTE — Progress Notes (Addendum)
  Radiation Oncology         [phone] ________________________________  Name: [name] MRN: [id number]  Date of Service: 07/14/2024  DOB: 03/19/1956  Post Treatment Telephone Note  Diagnosis:  Stage IIIA, pT2N3aM0, grade 2 ER/PR positive invasive ductal carcinoma of the left breast   Intent: Curative   The patient was not available for call today.    The patient has scheduled follow up with her medical oncologist Dr. Lanny for ongoing surveillance, and was encouraged to call if she develops concerns or questions regarding radiation.    First Treatment Date: 2024-04-23 Last Treatment Date: 2024-06-10   Plan Name: Breast_L_BH Site: Breast, Left Technique: 3D Mode: Photon Dose Per Fraction: 1.8 Gy Prescribed Dose (Delivered / Prescribed): 50.4 Gy / 50.4 Gy Prescribed Fxs (Delivered / Prescribed): 28 / 28   Plan Name: SCV_L_SCV_BH Site: Sclav-LT Technique: 3D Mode: Photon Dose Per Fraction: 1.8 Gy Prescribed Dose (Delivered / Prescribed): 50.4 Gy / 50.4 Gy Prescribed Fxs (Delivered / Prescribed): 28 / 28   Plan Name: Brst_L_Bst_BH Site: Breast, Left Technique: 3D Mode: Photon Dose Per Fraction: 2 Gy Prescribed Dose (Delivered / Prescribed): 10 Gy / 10 Gy Prescribed Fxs (Delivered / Prescribed): 5 / 5

## 2024-07-20 ENCOUNTER — Other Ambulatory Visit: Payer: Self-pay | Admitting: Family Medicine

## 2024-07-21 NOTE — Addendum Note (Signed)
 Encounter addended by: Albino Dyke FALCON, LPN on: 89/12/7972 12:45 PM  Actions taken: Clinical Note Signed

## 2024-08-04 ENCOUNTER — Inpatient Hospital Stay

## 2024-08-04 ENCOUNTER — Inpatient Hospital Stay: Admitting: Hematology

## 2024-08-04 NOTE — Assessment & Plan Note (Deleted)
-  pT3N3aM0, stage IIIA, ER+/PR+/HER2- -diagnosed in 09/2023. Patient presented with a palpable left breast mass, ultrasound showed multiple enlarged left axillary lymph node. -Due to her underlying dementia, she underwent upfront surgery on 10/04/2023.  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 -I discussed the high risk of recurrence due to large number of positive lymph node, and the benefit of adjuvant chemotherapy, followed by adjuvant radiation, aromatase inhibitor and CDK4/6 inhibitor  -her staging CT and bone scan was negative for distant metastasis, except a 1cm left axillary node which is likely reactive to surgery, will f/u on CT in 6 months  -she started adjuvant chemo AC on 11/12/2023, and weekly Taxol  on 01/07/24. She completed chemo on 04/01/2024 -due to her very high risk of recurrence, will monitor ctDNA testing every 3-6 months

## 2024-08-05 NOTE — Assessment & Plan Note (Signed)
-  pT3N3aM0, stage IIIA, ER+/PR+/HER2- -diagnosed in 09/2023. Patient presented with a palpable left breast mass, ultrasound showed multiple enlarged left axillary lymph node. -Due to her underlying dementia, she underwent upfront surgery on 10/04/2023.  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 -I discussed the high risk of recurrence due to large number of positive lymph node, and the benefit of adjuvant chemotherapy, followed by adjuvant radiation, aromatase inhibitor and CDK4/6 inhibitor  -her staging CT and bone scan was negative for distant metastasis, except a 1cm left axillary node which is likely reactive to surgery, will f/u on CT in 6 months  -she started adjuvant chemo AC on 11/12/2023, and weekly Taxol  on 01/07/24. She completed chemo on 04/01/2024 -she completed adjuvant RT on 06/10/2024 -due to her very high risk of recurrence, will monitor ctDNA testing every 3-6 months

## 2024-08-06 ENCOUNTER — Telehealth: Payer: Self-pay

## 2024-08-06 ENCOUNTER — Inpatient Hospital Stay

## 2024-08-06 ENCOUNTER — Other Ambulatory Visit (HOSPITAL_COMMUNITY): Payer: Self-pay

## 2024-08-06 ENCOUNTER — Inpatient Hospital Stay (HOSPITAL_BASED_OUTPATIENT_CLINIC_OR_DEPARTMENT_OTHER): Admitting: Hematology

## 2024-08-06 ENCOUNTER — Other Ambulatory Visit: Payer: Self-pay

## 2024-08-06 ENCOUNTER — Encounter: Payer: Self-pay | Admitting: Hematology

## 2024-08-06 ENCOUNTER — Inpatient Hospital Stay: Attending: Hematology

## 2024-08-06 ENCOUNTER — Telehealth: Payer: Self-pay | Admitting: Pharmacist

## 2024-08-06 VITALS — BP 120/70 | HR 64 | Temp 98.7°F | Resp 17 | Ht 62.0 in | Wt 183.3 lb

## 2024-08-06 DIAGNOSIS — Z23 Encounter for immunization: Secondary | ICD-10-CM | POA: Diagnosis not present

## 2024-08-06 DIAGNOSIS — Z170 Estrogen receptor positive status [ER+]: Secondary | ICD-10-CM

## 2024-08-06 DIAGNOSIS — E2839 Other primary ovarian failure: Secondary | ICD-10-CM | POA: Diagnosis not present

## 2024-08-06 DIAGNOSIS — M8580 Other specified disorders of bone density and structure, unspecified site: Secondary | ICD-10-CM | POA: Diagnosis not present

## 2024-08-06 DIAGNOSIS — T451X5A Adverse effect of antineoplastic and immunosuppressive drugs, initial encounter: Secondary | ICD-10-CM | POA: Diagnosis not present

## 2024-08-06 DIAGNOSIS — Z1732 Human epidermal growth factor receptor 2 negative status: Secondary | ICD-10-CM | POA: Diagnosis not present

## 2024-08-06 DIAGNOSIS — Z79899 Other long term (current) drug therapy: Secondary | ICD-10-CM | POA: Insufficient documentation

## 2024-08-06 DIAGNOSIS — Z923 Personal history of irradiation: Secondary | ICD-10-CM | POA: Diagnosis not present

## 2024-08-06 DIAGNOSIS — Z79811 Long term (current) use of aromatase inhibitors: Secondary | ICD-10-CM | POA: Diagnosis not present

## 2024-08-06 DIAGNOSIS — Z79624 Long term (current) use of inhibitors of nucleotide synthesis: Secondary | ICD-10-CM | POA: Diagnosis not present

## 2024-08-06 DIAGNOSIS — C50412 Malignant neoplasm of upper-outer quadrant of left female breast: Secondary | ICD-10-CM | POA: Insufficient documentation

## 2024-08-06 DIAGNOSIS — Z7982 Long term (current) use of aspirin: Secondary | ICD-10-CM | POA: Insufficient documentation

## 2024-08-06 DIAGNOSIS — G620 Drug-induced polyneuropathy: Secondary | ICD-10-CM | POA: Insufficient documentation

## 2024-08-06 DIAGNOSIS — C50912 Malignant neoplasm of unspecified site of left female breast: Secondary | ICD-10-CM

## 2024-08-06 LAB — CMP (CANCER CENTER ONLY)
ALT: 10 U/L (ref 0–44)
AST: 13 U/L — ABNORMAL LOW (ref 15–41)
Albumin: 3.8 g/dL (ref 3.5–5.0)
Alkaline Phosphatase: 107 U/L (ref 38–126)
Anion gap: 5 (ref 5–15)
BUN: 9 mg/dL (ref 8–23)
CO2: 28 mmol/L (ref 22–32)
Calcium: 10.8 mg/dL — ABNORMAL HIGH (ref 8.9–10.3)
Chloride: 109 mmol/L (ref 98–111)
Creatinine: 0.72 mg/dL (ref 0.44–1.00)
GFR, Estimated: >60 mL/min (ref >60)
Glucose, Bld: 117 mg/dL — ABNORMAL HIGH (ref 70–99)
Potassium: 3.6 mmol/L (ref 3.5–5.1)
Sodium: 142 mmol/L (ref 135–145)
Total Bilirubin: 0.3 mg/dL (ref 0.0–1.2)
Total Protein: 6.5 g/dL (ref 6.5–8.1)

## 2024-08-06 LAB — CBC WITH DIFFERENTIAL (CANCER CENTER ONLY)
Abs Immature Granulocytes: 0.03 K/uL (ref 0.00–0.07)
Basophils Absolute: 0 K/uL (ref 0.0–0.1)
Basophils Relative: 1 %
Eosinophils Absolute: 0.2 K/uL (ref 0.0–0.5)
Eosinophils Relative: 4 %
HCT: 33.5 % — ABNORMAL LOW (ref 36.0–46.0)
Hemoglobin: 10.7 g/dL — ABNORMAL LOW (ref 12.0–15.0)
Immature Granulocytes: 1 %
Lymphocytes Relative: 26 %
Lymphs Abs: 1.2 K/uL (ref 0.7–4.0)
MCH: 28.9 pg (ref 26.0–34.0)
MCHC: 31.9 g/dL (ref 30.0–36.0)
MCV: 90.5 fL (ref 80.0–100.0)
Monocytes Absolute: 0.4 K/uL (ref 0.1–1.0)
Monocytes Relative: 8 %
Neutro Abs: 2.7 K/uL (ref 1.7–7.7)
Neutrophils Relative %: 60 %
Platelet Count: 216 K/uL (ref 150–400)
RBC: 3.7 MIL/uL — ABNORMAL LOW (ref 3.87–5.11)
RDW: 13 % (ref 11.5–15.5)
WBC Count: 4.5 K/uL (ref 4.0–10.5)
nRBC: 0 % (ref 0.0–0.2)

## 2024-08-06 MED ORDER — ABEMACICLIB 100 MG PO TABS
100.0000 mg | ORAL_TABLET | Freq: Two times a day (BID) | ORAL | 0 refills | Status: DC
  Filled 2024-08-07: qty 56, 28d supply, fill #0

## 2024-08-06 MED ORDER — INFLUENZA VAC SPLIT HIGH-DOSE 0.5 ML IM SUSY
0.5000 mL | PREFILLED_SYRINGE | Freq: Once | INTRAMUSCULAR | Status: AC
  Administered 2024-08-06: 0.5 mL via INTRAMUSCULAR
  Filled 2024-08-06: qty 0.5

## 2024-08-06 NOTE — Telephone Encounter (Signed)
 Oral Oncology Patient Advocate Encounter   Received notification that prior authorization for VERZENIO is required.   PA submitted on 08/06/24 Key [id number] Status is pending      [name],  CPhT-Adv  she/her/hers [facility]  [facility] Pharmacy Technician Patient Advocate Specialist III [facility] Phone: [phone]  Fax: [phone] [name]

## 2024-08-06 NOTE — Patient Instructions (Signed)

## 2024-08-06 NOTE — Telephone Encounter (Signed)
 Oral Oncology Pharmacist Encounter  Received new prescription for Verzenio (abemaciclib) for the treatment of early stage, high risk, ER/PR positive, HER-2 negative breast cancer in conjunction with anastrozole , planned duration 2 years. Per MD note plan is for patient to start the first week of November.   CBC w/ Diff and CMP from 08/06/24 assessed, no relevant lab abnormalities requiring baseline dose adjustment required at this time. Prescription dose and frequency assessed for appropriateness. Dosing per MD.  Current medication list in Epic reviewed, no relevant/significant DDIs with Verzenio identified.  Evaluated chart and no patient barriers to medication adherence noted.   Patient agreement for treatment documented in MD note on 08/06/24.  Prescription has been e-scribed to the [facility] for benefits analysis and approval.  Oral Oncology Clinic will continue to follow for insurance authorization, copayment issues, initial counseling and start date.  Asberry Macintosh, PharmD, BCPS, BCOP Hematology/Oncology Clinical Pharmacist [phone] 08/06/2024 11:59 AM

## 2024-08-06 NOTE — Progress Notes (Signed)
 Verbal order w/readback order from Dr. Lanny for Signatera to be drawn. Order placed in EPIC and in [employer] portal.  Signatera kit and requisition given to [employer].

## 2024-08-06 NOTE — Progress Notes (Signed)
 [name]   Telephone:([phone]) [phone] [phone]   Clinic Follow up Note   Patient Care Team: [name], MD as PCP - General (Family Medicine) [name], MD as PCP - Cardiology (Cardiology) [name], MD (Inactive) as Consulting Physician (Neurology) [name], MD as Consulting Physician (Hematology and Oncology)  Date of Service:  08/06/2024  CHIEF COMPLAINT: f/u of left breast cancer  CURRENT THERAPY:  Adjuvant anastrozole , plan to start Verzenio in next few weeks   Oncology History   Malignant neoplasm of upper-outer quadrant of left breast in female, estrogen receptor positive (HCC) -pT3N3aM0, stage IIIA, ER+/PR+/HER2- -diagnosed in 09/2023. Patient presented with a palpable left breast mass, ultrasound showed multiple enlarged left axillary lymph node. -Due to her underlying dementia, she underwent upfront surgery on 10/04/2023.  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 -I discussed the high risk of recurrence due to large number of positive lymph node, and the benefit of adjuvant chemotherapy, followed by adjuvant radiation, aromatase inhibitor and CDK4/6 inhibitor  -her staging CT and bone scan was negative for distant metastasis, except a 1cm left axillary node which is likely reactive to surgery, will f/u on CT in 6 months  -she started adjuvant chemo AC on 11/12/2023, and weekly Taxol  on 01/07/24. She completed chemo on 04/01/2024 -she completed adjuvant RT on 06/10/2024 -due to her very high risk of recurrence, will monitor ctDNA testing every 3-6 months   Assessment & Plan Breast cancer, left upper-outer quadrant Status post radiation therapy with good tolerance. Currently on anastrozole  with manageable hot flashes. High risk of recurrence due to fourteen positive lymph nodes. - Start abemaciclib (Visenya) at 100 mg, one tablet twice a day, starting the first week of November. - Monitor blood  counts every two weeks initially, then adjust frequency based on tolerance. - Use Imodium for diarrhea management, up to 8 tablets a day, and contact provider if more than 4-5 tablets are needed. - Ensure adequate hydration with electrolytes like Gatorade during diarrhea episodes. - Perform circulating tumor DNA test every 3-6 months to monitor for early recurrence. - Schedule bone density scan at [facility] facility. - Administer flu shot today. - Discuss COVID-19 vaccination; recommend considering it due to chemotherapy history.  Hot flashes secondary to anti-estrogen therapy Experiencing hot flashes as a side effect of anastrozole . Symptoms are manageable with lifestyle adjustments. - Continue anastrozole  therapy.  Chemotherapy-induced peripheral neuropathy Experiencing numbness and cold sensation in hands and feet, common post-chemotherapy. Managed with gabapentin  for tingling and pain, though it does not alleviate numbness. Symptoms may improve over years but may not resolve completely. - Continue gabapentin  for tingling and pain management. - Advise caution with activities to prevent falls. - Consider tapering off gabapentin  if symptoms improve over time.  Plan - She is tolerating anastrozole  well with mild hot flashes, will continue - I recommend adjuvant Verzenio, will start her at reduced dose 100 mg twice daily, if she tolerates well will increase to full dose 150 mg twice daily.  Plan to start on November 3 - Lab and follow-up 2 weeks after she starts Verzenio - Will start ctDNA Signatera on next lab then q44m    SUMMARY OF ONCOLOGIC HISTORY: Oncology History  Infiltrating ductal carcinoma of breast (HCC)  09/22/2023 Initial Diagnosis   Infiltrating ductal carcinoma of breast (HCC)   11/12/2023 - 03/24/2024 Chemotherapy   Patient is on Treatment Plan : BREAST DOSE DENSE AC q14d / PACLitaxel  q7d  Malignant neoplasm of upper-outer quadrant of left breast in female, estrogen  receptor positive (HCC)  09/28/2023 Initial Diagnosis   Malignant neoplasm of upper-outer quadrant of left breast in female, estrogen receptor positive (HCC)   09/28/2023 Cancer Staging   Staging form: Breast, AJCC 8th Edition - Clinical stage from 09/28/2023: Stage IIA (cT2, cN1, cM0, G2, ER+, PR+, HER2-) - Signed by [name], MD on 09/28/2023 Histologic grading system: 3 grade system   10/04/2023 Cancer Staging   Staging form: Breast, AJCC 8th Edition - Pathologic stage from 10/04/2023: Stage IIIA (pT2, pN3a, cM0, G2, ER+, PR+, HER2-) - Signed by [name], MD on 10/19/2023 Histologic grading system: 3 grade system Residual tumor (R): R0 - None   10/31/2023 Imaging   NM whole body bone scan IMPRESSION: No evidence skeletal metastasis.   10/31/2023 Imaging   CT chest abdomen and pelvis with contrast  IMPRESSION: 1. Status post lumpectomy of the upper outer left breast and left axillary lymph node dissection with associated simple attenuation fluid collections, most consistent with postoperative seromas. 2. Enlarged left axillary lymph node measuring 1.0 x 0.8 cm, nonspecific and possibly reactive in the setting of recent surgery. Correlate with tissue findings of lymph node dissection. Attention on follow-up. 3. No other evidence of lymphadenopathy or metastatic disease in the chest, abdomen, or pelvis. 4. Nonobstructive bilateral nephrolithiasis.   11/08/2023 Echocardiogram   Echocardiogram FINDINGS   Left Ventricle: Left ventricular ejection fraction, by estimation, is 55 to 60%. Left ventricular ejection fraction by 3D volume is 56 %. The left ventricle has normal function. The left ventricle has no regional wall motion abnormalities. Global longitudinal strain performed but not reported based on interpreter judgement due to suboptimal tracking. The left ventricular internal cavity size was mildly dilated. There is mild left ventricular hypertrophy. Left ventricular diastolic  parameters are consistent with Grade I diastolic dysfunction (impaired relaxation). Indeterminate filling pressures.  Right Ventricle: The right ventricular size is normal. No increase in right ventricular wall thickness. Right ventricular systolic function is  low normal. There is normal pulmonary artery systolic pressure. The tricuspid regurgitant velocity is 2.12 m/s,  and with an assumed right atrial pressure of 3 mmHg, the estimated right ventricular systolic pressure is 21.0 mmHg.  Left Atrium: Left atrial size was normal in size.  Right Atrium: Right atrial size was normal in size.  Pericardium: There is no evidence of pericardial effusion.  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 No evidence of mitral valve stenosis.  Tricuspid Valve: The tricuspid valve is normal in structure. Tricuspid valve regurgitation is mild . No evidence of tricuspid stenosis.  Aortic Valve: The aortic valve is tricuspid. Aortic valve regurgitation is not visualized. No aortic stenosis is present.  Pulmonic Valve: The pulmonic valve was normal in structure. Pulmonic valve regurgitation is mild. No evidence of pulmonic stenosis.  Aorta: The aortic root and ascending aorta are structurally normal, with no evidence of dilitation.  Venous: The inferior vena cava is normal in size with greater than 50% respiratory variability, suggesting right atrial pressure of 3 mmHg.  IAS/Shunts: There is redundancy of the interatrial septum. Cannot exclude a small PFO.        Discussed the use of AI scribe software for clinical note transcription with the patient, who gave verbal consent to proceed.  History of Present Illness [name] is a 68 year old female with breast cancer who presents for follow-up.  She experiences neuropathy in her feet and  hands, characterized by numbness and a sensation of coldness, though she is not cold to the touch. Gabapentin  is used for tingling and  pain but does not relieve numbness.  She has completed radiation therapy and is currently on anastrozole . She experiences hot flashes as a side effect, which she manages by removing her hat.  Her hip pain is worsening, but she has no balance issues or falls.     All other systems were reviewed with the patient and are negative.  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   Hyperlipidemia    Hyperparathyroidism 07/25/2019   Hypertension    Normal cardiac stress test    Myoview stress test   OAB (overactive bladder) 09/01/2015   Osteopenia 05/2018   T score -1.1 FRAX 2.5%/ 0.1%   Subclinical hyperthyroidism 05/08/2007   Qualifier: Diagnosis of  By: Kennyth MD, Delon      SURGICAL HISTORY: Past Surgical History:  Procedure Laterality Date   BREAST BIOPSY Left 09/19/2023   x's 2   BREAST BIOPSY Left 09/19/2023   US  LT BREAST BX W LOC DEV 1ST LESION IMG BX SPEC US  GUIDE 09/19/2023 [facility]   BREAST BIOPSY Left 10/03/2023   US  LT RADIOACTIVE SEED LOC 10/03/2023 [facility]   BREAST BIOPSY Left 10/03/2023   US  LT RADIOACTIVE SEED EA ADD LESION 10/03/2023 [facility]   BREAST LUMPECTOMY WITH RADIOACTIVE SEED AND AXILLARY LYMPH NODE DISSECTION Left 10/04/2023   Procedure: LEFT BREAST LUMPECTOMY AND TARGETED LYMPH NODE DISSECTION;  Surgeon: Vernetta Berg, MD;  Location: [facility];  Service: General;  Laterality: Left;   COLONOSCOPY     IR IMAGING GUIDED PORT INSERTION  11/05/2023   POLYPECTOMY     TOTAL HIP ARTHROPLASTY Left 10/01/2013   DR  ROWAN   TOTAL HIP ARTHROPLASTY Left 10/01/2013   Procedure: TOTAL HIP ARTHROPLASTY;  Surgeon: Dempsey JINNY Sensor, MD;  Location: [facility] OR;  Service: Orthopedics;  Laterality: Left;    I have reviewed the social history and family history with the patient and they are unchanged from previous note.  ALLERGIES:  is allergic to [facility] [aprepitant], hydrochlorothiazide , hydrocodone , and simvastatin.  MEDICATIONS:  Current Outpatient Medications  Medication Sig Dispense Refill   abemaciclib (VERZENIO) 100 MG tablet Take 1 tablet (100 mg total) by mouth 2 (two) times daily. 56 tablet 0   acyclovir  ointment (ZOVIRAX ) 5 % APPLY 1 APPLICATION TOPICALLY FOUR TIMES DAILY FOR 7 DAYS 30 g 0   albuterol  (VENTOLIN  HFA) 108 (90 Base) MCG/ACT inhaler USE 2 INHALATIONS BY MOUTH EVERY 6 HOURS AS NEEDED FOR WHEEZING  OR SHORTNESS OF BREATH (Patient taking differently: Inhale 2 puffs into the lungs every 6 (six) hours as needed for wheezing or shortness of breath.) 26.8 g 2   amLODipine  (NORVASC ) 10 MG tablet Take 1 tablet (10 mg total) by mouth every morning. 100 tablet 2   anastrozole  (ARIMIDEX ) 1 MG tablet Take 1 tablet (1 mg total) by mouth  daily. 30 tablet 3   aspirin  EC 81 MG tablet Take 81 mg by mouth daily. Swallow whole.     Budesonide  90 MCG/ACT inhaler Inhale 1 puff into the lungs 2 (two) times daily. 1 each 0   capsaicin  (ZOSTRIX) 0.025 % cream Apply topically 2 (two) times daily. 60 g 0   cholecalciferol (VITAMIN D3) 25 MCG (1000 UNIT) tablet Take 1 tablet (1,000 Units total) by mouth daily. 7 tablet 0   Cholecalciferol (VITAMIN D3) 50 MCG (2000 UT) CHEW Chew 2,000 Units by mouth daily.     donepezil (ARICEPT) 10 MG tablet Take 10 mg by mouth at bedtime.     fluticasone  (FLONASE ) 50 MCG/ACT nasal spray USE 1 SPRAY IN BOTH NOSTRILS  DAILY (Patient taking differently: Place 1 spray into both nostrils daily as needed for allergies or rhinitis.) 16 g 0   fluticasone  (FLOVENT  HFA) 110 MCG/ACT inhaler Inhale 1 puff  into the lungs daily as needed. (Patient taking differently: Inhale 1 puff into the lungs daily as needed (for flares).) 1 each 12   gabapentin  (NEURONTIN ) 300 MG capsule Take 1 capsule (300 mg total) by mouth 2 (two) times daily. 60 capsule 3   KLOR-CON  M20 20 MEQ tablet Take 1 tablet by mouth twice daily 30 tablet 0   loratadine  (EQ ALLERGY RELIEF) 10 MG tablet Take 1 tablet (10 mg total) by mouth daily. (Patient taking differently: Take 10 mg by mouth daily as needed for allergies or rhinitis.) 90 tablet 3   losartan  (COZAAR ) 50 MG tablet Take 1 tablet (50 mg total) by mouth at bedtime. 90 tablet 3   metoprolol  tartrate (LOPRESSOR ) 50 MG tablet Take 1 tablet (50 mg total) by mouth 2 (two) times daily. 200 tablet 2   ondansetron  (ZOFRAN -ODT) 4 MG disintegrating tablet Take 4 mg by mouth every 8 (eight) hours as needed for nausea or vomiting (dissolve orally).     rosuvastatin  (CRESTOR ) 5 MG tablet TAKE 1 TABLET BY MOUTH EVERY  MONDAY AND FRIDAY 20 tablet 4   sertraline  (ZOLOFT ) 50 MG tablet TAKE 1 TABLET BY MOUTH DAILY 100 tablet 3   traMADol  (ULTRAM ) 50 MG tablet Take 1 tablet (50 mg total) by mouth every 6 (six) hours as needed for moderate pain (pain score 4-6) or severe pain (pain score 7-10). 25 tablet 0   TYLENOL  500 MG tablet Take 500-1,000 mg by mouth every 6 (six) hours as needed for mild pain (pain score 1-3) or headache.     No current facility-administered medications for this visit.    PHYSICAL EXAMINATION: ECOG PERFORMANCE STATUS: 0 - Asymptomatic  Vitals:   08/06/24 1000  BP: 120/70  Pulse: 64  Resp: 17  Temp: 98.7 F (37.1 C)  SpO2: 99%   Wt Readings from Last 3 Encounters:  08/06/24 183 lb 4.8 oz (83.1 kg)  05/12/24 177 lb (80.3 kg)  05/12/24 178 lb 3.2 oz (80.8 kg)     GENERAL:alert, no distress and comfortable SKIN: skin color, texture, turgor are normal, no rashes or significant lesions EYES: normal, Conjunctiva are pink and non-injected, sclera clear NECK:  supple, thyroid  normal size, non-tender, without nodularity LYMPH:  no palpable lymphadenopathy in the cervical, axillary  LUNGS: clear to auscultation and percussion with normal breathing effort HEART: regular rate & rhythm and no murmurs and no lower extremity edema ABDOMEN:abdomen soft, non-tender and normal bowel sounds Musculoskeletal:no cyanosis of digits and no clubbing  NEURO: alert & oriented x 3 with fluent speech, no focal motor/sensory deficits  Physical Exam    LABORATORY DATA:  I have reviewed the data as listed    Latest Ref Rng & Units 08/06/2024    9:40 AM 06/09/2024   11:04 AM 05/12/2024   11:20 AM  CBC  WBC 4.0 - 10.5 K/uL 4.5  4.3  5.0   Hemoglobin 12.0 - 15.0 g/dL 89.2  89.4  89.7   Hematocrit 36.0 - 46.0 % 33.5  32.8  31.9   Platelets 150 - 400 K/uL 216  239  247         Latest Ref Rng & Units 08/06/2024    9:40 AM 06/09/2024   11:04 AM 05/12/2024   11:20 AM  CMP  Glucose 70 - 99 mg/dL 882  893  888   BUN 8 - 23 mg/dL 9  10  7    Creatinine 0.44 - 1.00 mg/dL 9.27  9.37  9.29   Sodium 135 - 145 mmol/L 142  142  141   Potassium 3.5 - 5.1 mmol/L 3.6  3.4  3.6   Chloride 98 - 111 mmol/L 109  110  110   CO2 22 - 32 mmol/L 28  27  27    Calcium  8.9 - 10.3 mg/dL 89.1  89.5  89.6   Total Protein 6.5 - 8.1 g/dL 6.5  6.4  6.8   Total Bilirubin 0.0 - 1.2 mg/dL 0.3  0.3  0.3   Alkaline Phos 38 - 126 U/L 107  92  91   AST 15 - 41 U/L 13  12  16    ALT 0 - 44 U/L 10  9  14        RADIOGRAPHIC STUDIES: I have personally reviewed the radiological images as listed and agreed with the findings in the report. No results found.    Orders Placed This Encounter  Procedures   DG Bone Density    Standing Status:   Future    Expected Date:   09/06/2024    Expiration Date:   05/12/2025    Reason for Exam (SYMPTOM  OR DIAGNOSIS REQUIRED):   screening    Preferred imaging location?:   [facility]   All questions were answered. The patient knows to call the  clinic with any problems, questions or concerns. No barriers to learning was detected. The total time spent in the appointment was 40 minutes, including review of chart and various tests results, discussions about plan of care and coordination of care plan     Onita Mattock, MD 08/06/2024

## 2024-08-06 NOTE — Telephone Encounter (Signed)
 Oral Oncology Patient Advocate Encounter  Prior Authorization for Verzenio has been approved.    PA# [id number] Effective dates: 08/06/2024 through 10/15/2025  Patients co-pay is $0.00.     Charlott [place],  CPhT-Adv  she/her/hers [facility]  [facility] Pharmacy Technician Patient Advocate Specialist III [facility] Phone: [phone]  Fax: [phone] [email or web address]

## 2024-08-07 ENCOUNTER — Other Ambulatory Visit: Payer: Self-pay

## 2024-08-07 NOTE — Progress Notes (Signed)
 Oral Chemotherapy Pharmacist Encounter  Patient was counseled under telephone encounter from 08/06/24.  Asberry Macintosh, PharmD, BCPS, BCOP Hematology/Oncology Clinical Pharmacist [facility] and [facility] Oral Chemotherapy Navigation Clinics [phone] 08/07/2024 1:40 PM

## 2024-08-07 NOTE — Progress Notes (Signed)
 Specialty Pharmacy Initial Fill Coordination Note  [name] is a 68 y.o. female contacted today regarding refills of specialty medication(s) Abemaciclib (VERZENIO) .  Patient requested Delivery  on 08/13/24  to verified address [address] [place] [address]   Medication will be filled on 08/12/24.   Patient is aware of $0.00 copayment.

## 2024-08-07 NOTE — Telephone Encounter (Signed)
 Oral Chemotherapy Pharmacist Encounter  I spoke with patient for overview of: Verzenio (abemaciclib) for the treatment of early stage, high risk, ER/PR positive, HER-2 negative breast cancer in conjunction with anastrozole , planned duration 2 years. Per MD note plan is for patient to start the first week of November.   Treatment goal: Curative  Counseled patient on administration, dosing, side effects, monitoring, drug-food interactions, safe handling, storage, and disposal.  Patient will take Verzenio 100mg  tablets, 1 tablet by mouth twice daily without regard to food. Patient knows to avoid grapefruit and grapefruit juice.  Verzenio start date: 08/18/24  Adverse effects include but are not limited to: diarrhea, fatigue, nausea, abdominal pain, decreased blood counts, and increased liver function tests, and joint pains. Diarrhea: Patient will obtain anti diarrheal and alert the office of 4 or more loose stools above baseline. Other diarrhea management strategies also discussed with patient included dietary changes of eating bland, low-fiber meals in the beginning when diarrhea risk is highest with Verzenio. Also discussed avoiding foods that may upset stomach including spicy, fried and greasy foods.  Nausea/Vomiting: Will ask MD to send in PRN ondansetron  for patient to have on hand for nausea/vomiting   Reviewed with patient importance of keeping a medication schedule and plan for any missed doses. No barriers to medication adherence identified.  Medication reconciliation performed and medication/allergy list updated.  Distress thermometer flowsheet: Distress thermometer not completed during telephone call as patient has been on previous lines of therapy.   Communication and Learning Assessment Primary learner: Patient Barriers to learning: No barriers Preferred language: English Learning preferences: Listening Reading  All questions answered.  Ms. Lopata voiced understanding and  appreciation.   Medication education handout placed in mail for patient. Patient knows to call the office with questions or concerns. Oral Chemotherapy Clinic phone number provided to patient.   Asberry Macintosh, PharmD, BCPS, BCOP Hematology/Oncology Clinical Pharmacist [phone] 08/07/2024 1:30 PM

## 2024-08-08 ENCOUNTER — Other Ambulatory Visit: Payer: Self-pay

## 2024-08-08 ENCOUNTER — Other Ambulatory Visit (HOSPITAL_COMMUNITY): Payer: Self-pay

## 2024-08-12 ENCOUNTER — Other Ambulatory Visit: Payer: Self-pay

## 2024-08-12 DIAGNOSIS — L080 Pyoderma: Secondary | ICD-10-CM

## 2024-08-12 DIAGNOSIS — I10 Essential (primary) hypertension: Secondary | ICD-10-CM

## 2024-08-12 MED ORDER — METOPROLOL TARTRATE 50 MG PO TABS: 50.0000 mg | ORAL_TABLET | Freq: Two times a day (BID) | ORAL | 2 refills | Status: DC

## 2024-08-12 MED ORDER — ANASTROZOLE 1 MG PO TABS: 1.0000 mg | ORAL_TABLET | Freq: Every day | ORAL | 3 refills | Status: DC

## 2024-08-12 MED ORDER — ACYCLOVIR 5 % EX OINT: TOPICAL_OINTMENT | Freq: Four times a day (QID) | CUTANEOUS | 0 refills | Status: AC

## 2024-08-14 ENCOUNTER — Telehealth: Payer: Self-pay

## 2024-08-14 NOTE — Telephone Encounter (Signed)
 Rx Refill request received via fax x [facility]. Medication: Gabapentin  CAP

## 2024-08-15 ENCOUNTER — Other Ambulatory Visit (HOSPITAL_COMMUNITY): Payer: Self-pay

## 2024-08-17 ENCOUNTER — Other Ambulatory Visit: Payer: Self-pay | Admitting: Nurse Practitioner

## 2024-08-17 DIAGNOSIS — M792 Neuralgia and neuritis, unspecified: Secondary | ICD-10-CM

## 2024-08-17 MED ORDER — GABAPENTIN 300 MG PO CAPS: 300.0000 mg | ORAL_CAPSULE | Freq: Two times a day (BID) | ORAL | 3 refills | Status: DC

## 2024-08-19 ENCOUNTER — Other Ambulatory Visit: Payer: Self-pay | Admitting: Hematology

## 2024-08-19 DIAGNOSIS — M792 Neuralgia and neuritis, unspecified: Secondary | ICD-10-CM

## 2024-08-19 MED ORDER — GABAPENTIN 300 MG PO CAPS: 300.0000 mg | ORAL_CAPSULE | Freq: Two times a day (BID) | ORAL | 3 refills | Status: DC

## 2024-09-01 ENCOUNTER — Other Ambulatory Visit: Payer: Self-pay | Admitting: Hematology

## 2024-09-01 ENCOUNTER — Other Ambulatory Visit: Payer: Self-pay

## 2024-09-01 ENCOUNTER — Other Ambulatory Visit (HOSPITAL_COMMUNITY): Payer: Self-pay

## 2024-09-01 DIAGNOSIS — I10 Essential (primary) hypertension: Secondary | ICD-10-CM

## 2024-09-01 NOTE — Assessment & Plan Note (Signed)
-  pT3N3aM0, stage IIIA, ER+/PR+/HER2- -diagnosed in 09/2023. Patient presented with a palpable left breast mass, ultrasound showed multiple enlarged left axillary lymph node. -Due to her underlying dementia, she underwent upfront surgery on 10/04/2023.  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 -I discussed the high risk of recurrence due to large number of positive lymph node, and the benefit of adjuvant chemotherapy, followed by adjuvant radiation, aromatase inhibitor and CDK4/6 inhibitor  -her staging CT and bone scan was negative for distant metastasis, except a 1cm left axillary node which is likely reactive to surgery, will f/u on CT in 6 months  -she started adjuvant chemo AC on 11/12/2023, and weekly Taxol  on 01/07/24. She completed chemo on 04/01/2024 -she completed adjuvant RT on 06/10/2024 -she started anastrozole  in 06/2024, I also recommend adjuvant Verzenio -due to her very high risk of recurrence, will monitor ctDNA testing every 3-6 months

## 2024-09-02 ENCOUNTER — Encounter: Payer: Self-pay | Admitting: Hematology

## 2024-09-02 ENCOUNTER — Other Ambulatory Visit: Payer: Self-pay

## 2024-09-02 ENCOUNTER — Inpatient Hospital Stay: Admitting: Hematology

## 2024-09-02 ENCOUNTER — Inpatient Hospital Stay: Attending: Hematology

## 2024-09-02 VITALS — BP 122/72 | HR 69 | Temp 98.0°F | Resp 16 | Ht 62.0 in | Wt 177.3 lb

## 2024-09-02 DIAGNOSIS — Z79624 Long term (current) use of inhibitors of nucleotide synthesis: Secondary | ICD-10-CM | POA: Diagnosis not present

## 2024-09-02 DIAGNOSIS — Z7951 Long term (current) use of inhaled steroids: Secondary | ICD-10-CM | POA: Diagnosis not present

## 2024-09-02 DIAGNOSIS — C50912 Malignant neoplasm of unspecified site of left female breast: Secondary | ICD-10-CM

## 2024-09-02 DIAGNOSIS — Z7982 Long term (current) use of aspirin: Secondary | ICD-10-CM | POA: Insufficient documentation

## 2024-09-02 DIAGNOSIS — C50412 Malignant neoplasm of upper-outer quadrant of left female breast: Secondary | ICD-10-CM | POA: Diagnosis present

## 2024-09-02 DIAGNOSIS — Z170 Estrogen receptor positive status [ER+]: Secondary | ICD-10-CM

## 2024-09-02 DIAGNOSIS — Z79811 Long term (current) use of aromatase inhibitors: Secondary | ICD-10-CM | POA: Diagnosis not present

## 2024-09-02 DIAGNOSIS — Z1732 Human epidermal growth factor receptor 2 negative status: Secondary | ICD-10-CM | POA: Insufficient documentation

## 2024-09-02 LAB — CMP (CANCER CENTER ONLY)
ALT: 8 U/L (ref 0–44)
AST: 11 U/L — ABNORMAL LOW (ref 15–41)
Albumin: 3.9 g/dL (ref 3.5–5.0)
Alkaline Phosphatase: 110 U/L (ref 38–126)
Anion gap: 5 (ref 5–15)
BUN: 15 mg/dL (ref 8–23)
CO2: 28 mmol/L (ref 22–32)
Calcium: 10.9 mg/dL — ABNORMAL HIGH (ref 8.9–10.3)
Chloride: 111 mmol/L (ref 98–111)
Creatinine: 1.15 mg/dL — ABNORMAL HIGH (ref 0.44–1.00)
GFR, Estimated: 52 mL/min — ABNORMAL LOW
Glucose, Bld: 98 mg/dL (ref 70–99)
Potassium: 3.6 mmol/L (ref 3.5–5.1)
Sodium: 144 mmol/L (ref 135–145)
Total Bilirubin: 0.4 mg/dL (ref 0.0–1.2)
Total Protein: 6.5 g/dL (ref 6.5–8.1)

## 2024-09-02 LAB — CBC WITH DIFFERENTIAL (CANCER CENTER ONLY)
Abs Immature Granulocytes: 0.01 K/uL (ref 0.00–0.07)
Basophils Absolute: 0 K/uL (ref 0.0–0.1)
Basophils Relative: 1 %
Eosinophils Absolute: 0.1 K/uL (ref 0.0–0.5)
Eosinophils Relative: 3 %
HCT: 34 % — ABNORMAL LOW (ref 36.0–46.0)
Hemoglobin: 10.9 g/dL — ABNORMAL LOW (ref 12.0–15.0)
Immature Granulocytes: 0 %
Lymphocytes Relative: 37 %
Lymphs Abs: 1.2 K/uL (ref 0.7–4.0)
MCH: 29.4 pg (ref 26.0–34.0)
MCHC: 32.1 g/dL (ref 30.0–36.0)
MCV: 91.6 fL (ref 80.0–100.0)
Monocytes Absolute: 0.1 K/uL (ref 0.1–1.0)
Monocytes Relative: 4 %
Neutro Abs: 1.7 K/uL (ref 1.7–7.7)
Neutrophils Relative %: 55 %
Platelet Count: 147 K/uL — ABNORMAL LOW (ref 150–400)
RBC: 3.71 MIL/uL — ABNORMAL LOW (ref 3.87–5.11)
RDW: 12.4 % (ref 11.5–15.5)
WBC Count: 3.2 K/uL — ABNORMAL LOW (ref 4.0–10.5)
nRBC: 0 % (ref 0.0–0.2)

## 2024-09-02 LAB — GENETIC SCREENING ORDER

## 2024-09-02 MED ORDER — METOPROLOL TARTRATE 50 MG PO TABS: 50.0000 mg | ORAL_TABLET | Freq: Two times a day (BID) | ORAL | 2 refills | Status: DC

## 2024-09-02 MED ORDER — ABEMACICLIB 50 MG PO TABS
50.0000 mg | ORAL_TABLET | Freq: Two times a day (BID) | ORAL | 0 refills | Status: DC
  Filled 2024-09-02 – 2024-09-05 (×2): qty 56, 28d supply, fill #0

## 2024-09-02 NOTE — Progress Notes (Signed)
 [facility]   [phone]) [phone] [phone]   Clinic Follow up Note   Patient Care Team: [name], MD as PCP - General (Family Medicine) [name], MD as PCP - Cardiology (Cardiology) [name], MD (Inactive) as Consulting Physician (Neurology) [name], MD as Consulting Physician (Hematology and Oncology)  Date of Service:  09/02/2024  CHIEF COMPLAINT: f/u of breast cancer  CURRENT THERAPY:  Anastrozole  and Verzenio 100 mg twice daily  Oncology History   Malignant neoplasm of upper-outer quadrant of left breast in female, estrogen receptor positive (HCC) -pT3N3aM0, stage IIIA, ER+/PR+/HER2- -diagnosed in 09/2023. Patient presented with a palpable left breast mass, ultrasound showed multiple enlarged left axillary lymph node. -Due to her underlying dementia, she underwent upfront surgery on 10/04/2023.  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 -I discussed the high risk of recurrence due to large number of positive lymph node, and the benefit of adjuvant chemotherapy, followed by adjuvant radiation, aromatase inhibitor and CDK4/6 inhibitor  -her staging CT and bone scan was negative for distant metastasis, except a 1cm left axillary node which is likely reactive to surgery, will f/u on CT in 6 months  -she started adjuvant chemo AC on 11/12/2023, and weekly Taxol  on 01/07/24. She completed chemo on 04/01/2024 -she completed adjuvant RT on 06/10/2024 -she started anastrozole  in 06/2024, I also recommend adjuvant Verzenio -due to her very high risk of recurrence, will monitor ctDNA testing every 3-6 months   Assessment & Plan Breast cancer, status post left upper-outer quadrant resection, on adjuvant therapy Currently on adjuvant therapy with anastrozole  and Visenio. Concerns about cancer recurrence due to extensive lymph node involvement. No new nodules or abnormalities on examination. - Continue  anastrozole  therapy. - Hold Verzenio for now. - Start lower dose of Verzenio at 50 mg twice daily after Thanksgiving week. - Monitor for recurrence signs.  Adverse effects of cancer therapy: diarrhea, nausea, and fatigue Experiencing diarrhea, nausea, and fatigue, likely related to Visenio. Symptoms have improved but persist. Nausea is not common but can occur. Diarrhea is common and expected to improve over time. Fatigue may be related to dehydration and medication side effects. - Take Imodium as needed for diarrhea. - Monitor symptoms and adjust Visenio dose as tolerated. - Ensure adequate hydration to address fatigue.  Post-surgical bilateral breast pain Intermittent shooting pain in both breasts post-surgery. Left breast shows some scar tissue and slight swelling, but no nodules. Right breast pain is present but no abnormalities detected.  Hypertension Managed with multiple medications. Blood pressure monitoring is necessary, especially if symptoms of dehydration or fatigue persist. - Monitor blood pressure, especially when standing. - Adjust blood pressure medications if dehydration is suspected.  Plan - She is tolerating anastrozole  well, but does have moderated diarrhea, intermittent nausea from Verzenio. - Will stop Verzenio 100 mg twice daily - I called in lower dose Verzenio 50 mg twice daily for her, she will start on December 1 - Lab and follow-up in 4 weeks   SUMMARY OF ONCOLOGIC HISTORY: Oncology History  Infiltrating ductal carcinoma of breast (HCC)  09/22/2023 Initial Diagnosis   Infiltrating ductal carcinoma of breast (HCC)   11/12/2023 - 03/24/2024 Chemotherapy   Patient is on Treatment Plan : BREAST DOSE DENSE AC q14d / PACLitaxel  q7d     Malignant neoplasm of upper-outer quadrant of left breast in female, estrogen receptor positive (HCC)  09/28/2023 Initial Diagnosis   Malignant neoplasm of upper-outer quadrant of left breast in  female, estrogen receptor positive  (HCC)   09/28/2023 Cancer Staging   Staging form: Breast, AJCC 8th Edition - Clinical stage from 09/28/2023: Stage IIA (cT2, cN1, cM0, G2, ER+, PR+, HER2-) - Signed by [name], MD on 09/28/2023 Histologic grading system: 3 grade system   10/04/2023 Cancer Staging   Staging form: Breast, AJCC 8th Edition - Pathologic stage from 10/04/2023: Stage IIIA (pT2, pN3a, cM0, G2, ER+, PR+, HER2-) - Signed by [name], MD on 10/19/2023 Histologic grading system: 3 grade system Residual tumor (R): R0 - None   10/31/2023 Imaging   NM whole body bone scan IMPRESSION: No evidence skeletal metastasis.   10/31/2023 Imaging   CT chest abdomen and pelvis with contrast  IMPRESSION: 1. Status post lumpectomy of the upper outer left breast and left axillary lymph node dissection with associated simple attenuation fluid collections, most consistent with postoperative seromas. 2. Enlarged left axillary lymph node measuring 1.0 x 0.8 cm, nonspecific and possibly reactive in the setting of recent surgery. Correlate with tissue findings of lymph node dissection. Attention on follow-up. 3. No other evidence of lymphadenopathy or metastatic disease in the chest, abdomen, or pelvis. 4. Nonobstructive bilateral nephrolithiasis.   11/08/2023 Echocardiogram   Echocardiogram FINDINGS   Left Ventricle: Left ventricular ejection fraction, by estimation, is 55 to 60%. Left ventricular ejection fraction by 3D volume is 56 %. The left ventricle has normal function. The left ventricle has no regional wall motion abnormalities. Global longitudinal strain performed but not reported based on interpreter judgement due to suboptimal tracking. The left ventricular internal cavity size was mildly dilated. There is mild left ventricular hypertrophy. Left ventricular diastolic parameters are consistent with Grade I diastolic dysfunction (impaired relaxation). Indeterminate filling pressures.  Right Ventricle: The right ventricular size  is normal. No increase in right ventricular wall thickness. Right ventricular systolic function is  low normal. There is normal pulmonary artery systolic pressure. The tricuspid regurgitant velocity is 2.12 m/s,  and with an assumed right atrial pressure of 3 mmHg, the estimated right ventricular systolic pressure is 21.0 mmHg.  Left Atrium: Left atrial size was normal in size.  Right Atrium: Right atrial size was normal in size.  Pericardium: There is no evidence of pericardial effusion.  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 No evidence of mitral valve stenosis.  Tricuspid Valve: The tricuspid valve is normal in structure. Tricuspid valve regurgitation is mild . No evidence of tricuspid stenosis.  Aortic Valve: The aortic valve is tricuspid. Aortic valve regurgitation is not visualized. No aortic stenosis is present.  Pulmonic Valve: The pulmonic valve was normal in structure. Pulmonic valve regurgitation is mild. No evidence of pulmonic stenosis.  Aorta: The aortic root and ascending aorta are structurally normal, with no evidence of dilitation.  Venous: The inferior vena cava is normal in size with greater than 50% respiratory variability, suggesting right atrial pressure of 3 mmHg.  IAS/Shunts: There is redundancy of the interatrial septum. Cannot exclude a small PFO.        Discussed the use of AI scribe software for clinical note transcription with the patient, who gave verbal consent to proceed.  History of Present Illness [name] is a 68 year old female with breast cancer who presents for follow-up. She is accompanied by her caregiver.  She experiences nausea and diarrhea since starting Visenya on October 22nd. Nausea is intermittent and initially severe, with occasional vomiting, but has improved. Diarrhea is common but shows some improvement.  She has not eaten or taken her medication this morning due to these symptoms.  Her weight  has fluctuated, with a current weight of 177 pounds, attributed to diarrhea and decreased appetite. She consumes ginger ale, pickles, and saltines to manage symptoms.  She is on Letrozole without issues, except for joint pain, which predates its use. Her medication regimen includes gabapentin , acyclovir , metoprolol , a cholesterol medication, potassium, losartan , and tramadol .  She experiences occasional sharp, shooting pain in both breasts, more frequently in the left breast where surgery was performed. She feels tired and nauseous, and her caregiver notes inadequate water intake, which may contribute to her symptoms.     All other systems were reviewed with the patient and are negative.  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   Hyperlipidemia    Hyperparathyroidism 07/25/2019   Hypertension    Normal cardiac stress test    Myoview stress test   OAB (overactive bladder) 09/01/2015   Osteopenia 05/2018   T score -1.1 FRAX 2.5%/ 0.1%   Subclinical hyperthyroidism 05/08/2007   Qualifier: Diagnosis of  By: Kennyth MD, Delon      SURGICAL HISTORY: Past Surgical History:  Procedure Laterality Date   BREAST BIOPSY Left 09/19/2023   x's 2   BREAST BIOPSY Left 09/19/2023   US  LT BREAST BX W LOC DEV 1ST LESION IMG BX SPEC US  GUIDE 09/19/2023 [facility]   BREAST BIOPSY Left 10/03/2023   US  LT RADIOACTIVE SEED LOC 10/03/2023 [facility]   BREAST BIOPSY Left 10/03/2023   US  LT RADIOACTIVE SEED EA ADD LESION 10/03/2023 [facility]   BREAST LUMPECTOMY WITH RADIOACTIVE  SEED AND AXILLARY LYMPH NODE DISSECTION Left 10/04/2023   Procedure: LEFT BREAST LUMPECTOMY AND TARGETED LYMPH NODE DISSECTION;  Surgeon: Vernetta Berg, MD;  Location: [facility];  Service: General;  Laterality: Left;   COLONOSCOPY     IR IMAGING GUIDED PORT INSERTION  11/05/2023   POLYPECTOMY     TOTAL HIP ARTHROPLASTY Left 10/01/2013   DR ROWAN   TOTAL HIP ARTHROPLASTY Left 10/01/2013   Procedure: TOTAL HIP ARTHROPLASTY;  Surgeon: Dempsey JINNY Sensor, MD;  Location: [facility] OR;  Service: Orthopedics;  Laterality: Left;    I have reviewed the social history and family history with the patient and they are unchanged from previous note.  ALLERGIES:  is allergic to [facility] [aprepitant], hydrochlorothiazide , hydrocodone , and simvastatin.  MEDICATIONS:  Current Outpatient Medications  Medication Sig Dispense Refill   abemaciclib (VERZENIO) 50 MG tablet Take 1 tablet (50 mg total) by mouth 2 (two) times daily. 56 tablet 0   abemaciclib (VERZENIO) 100 MG tablet Take 1 tablet (100 mg total) by mouth 2 (two) times daily. 56 tablet 0   acyclovir  ointment (ZOVIRAX ) 5 % Apply topically 4 (four) times daily. For 7 days 30 g 0   albuterol  (VENTOLIN  HFA) 108 (90 Base) MCG/ACT inhaler USE 2 INHALATIONS BY MOUTH EVERY 6 HOURS AS NEEDED FOR WHEEZING  OR SHORTNESS  OF BREATH (Patient taking differently: Inhale 2 puffs into the lungs every 6 (six) hours as needed for wheezing or shortness of breath.) 26.8 g 2   amLODipine  (NORVASC ) 10 MG tablet Take 1 tablet (10 mg total) by mouth every morning. 100 tablet 2   anastrozole  (ARIMIDEX ) 1 MG tablet Take 1 tablet (1 mg total) by mouth daily. 30 tablet 3   aspirin  EC 81 MG tablet Take 81 mg by mouth daily. Swallow whole.     Budesonide  90 MCG/ACT inhaler Inhale 1 puff into the lungs 2 (two) times daily. 1 each 0   capsaicin  (ZOSTRIX) 0.025 % cream Apply topically 2 (two) times daily. 60 g 0   cholecalciferol (VITAMIN D3) 25 MCG (1000 UNIT) tablet Take 1 tablet  (1,000 Units total) by mouth daily. 7 tablet 0   Cholecalciferol (VITAMIN D3) 50 MCG (2000 UT) CHEW Chew 2,000 Units by mouth daily.     donepezil (ARICEPT) 10 MG tablet Take 10 mg by mouth at bedtime.     fluticasone  (FLONASE ) 50 MCG/ACT nasal spray USE 1 SPRAY IN BOTH NOSTRILS  DAILY (Patient taking differently: Place 1 spray into both nostrils daily as needed for allergies or rhinitis.) 16 g 0   fluticasone  (FLOVENT  HFA) 110 MCG/ACT inhaler Inhale 1 puff into the lungs daily as needed. (Patient taking differently: Inhale 1 puff into the lungs daily as needed (for flares).) 1 each 12   gabapentin  (NEURONTIN ) 300 MG capsule Take 1 capsule (300 mg total) by mouth 2 (two) times daily. 60 capsule 3   KLOR-CON  M20 20 MEQ tablet Take 1 tablet by mouth twice daily 30 tablet 0   loratadine  (EQ ALLERGY RELIEF) 10 MG tablet Take 1 tablet (10 mg total) by mouth daily. (Patient taking differently: Take 10 mg by mouth daily as needed for allergies or rhinitis.) 90 tablet 3   losartan  (COZAAR ) 50 MG tablet Take 1 tablet (50 mg total) by mouth at bedtime. 90 tablet 3   metoprolol  tartrate (LOPRESSOR ) 50 MG tablet Take 1 tablet (50 mg total) by mouth 2 (two) times daily. 200 tablet 2   rosuvastatin  (CRESTOR ) 5 MG tablet TAKE 1 TABLET BY MOUTH EVERY  MONDAY AND FRIDAY 20 tablet 4   sertraline  (ZOLOFT ) 50 MG tablet TAKE 1 TABLET BY MOUTH DAILY 100 tablet 3   traMADol  (ULTRAM ) 50 MG tablet Take 1 tablet (50 mg total) by mouth every 6 (six) hours as needed for moderate pain (pain score 4-6) or severe pain (pain score 7-10). 25 tablet 0   TYLENOL  500 MG tablet Take 500-1,000 mg by mouth every 6 (six) hours as needed for mild pain (pain score 1-3) or headache.     No current facility-administered medications for this visit.    PHYSICAL EXAMINATION: ECOG PERFORMANCE STATUS: 1 - Symptomatic but completely ambulatory  Vitals:   09/02/24 0900  BP: 122/72  Pulse: 69  Resp: 16  Temp: 98 F (36.7 C)  SpO2: 100%    Wt Readings from Last 3 Encounters:  09/02/24 177 lb 4.8 oz (80.4 kg)  08/06/24 183 lb 4.8 oz (83.1 kg)  05/12/24 177 lb (80.3 kg)     GENERAL:alert, no distress and comfortable SKIN: skin color, texture, turgor are normal, no rashes or significant lesions EYES: normal, Conjunctiva are pink and non-injected, sclera clear NECK: supple, thyroid  normal size, non-tender, without nodularity LYMPH:  no palpable lymphadenopathy in the cervical, axillary  LUNGS: clear to auscultation and percussion with normal breathing effort HEART: regular rate &  rhythm and no murmurs and no lower extremity edema ABDOMEN:abdomen soft, non-tender and normal bowel sounds Musculoskeletal:no cyanosis of digits and no clubbing  NEURO: alert & oriented x 3 with fluent speech, no focal motor/sensory deficits BREAST: Left breast with scar tissue, breast slightly swollen, no masses in both breasts or axilla. Physical Exam   LABORATORY DATA:  I have reviewed the data as listed    Latest Ref Rng & Units 09/02/2024    9:01 AM 08/06/2024    9:40 AM 06/09/2024   11:04 AM  CBC  WBC 4.0 - 10.5 K/uL 3.2  4.5  4.3   Hemoglobin 12.0 - 15.0 g/dL 89.0  89.2  89.4   Hematocrit 36.0 - 46.0 % 34.0  33.5  32.8   Platelets 150 - 400 K/uL 147  216  239         Latest Ref Rng & Units 09/02/2024    9:01 AM 08/06/2024    9:40 AM 06/09/2024   11:04 AM  CMP  Glucose 70 - 99 mg/dL 98  882  893   BUN 8 - 23 mg/dL 15  9  10    Creatinine 0.44 - 1.00 mg/dL 8.84  9.27  9.37   Sodium 135 - 145 mmol/L 144  142  142   Potassium 3.5 - 5.1 mmol/L 3.6  3.6  3.4   Chloride 98 - 111 mmol/L 111  109  110   CO2 22 - 32 mmol/L 28  28  27    Calcium  8.9 - 10.3 mg/dL 89.0  89.1  89.5   Total Protein 6.5 - 8.1 g/dL 6.5  6.5  6.4   Total Bilirubin 0.0 - 1.2 mg/dL 0.4  0.3  0.3   Alkaline Phos 38 - 126 U/L 110  107  92   AST 15 - 41 U/L 11  13  12    ALT 0 - 44 U/L 8  10  9        RADIOGRAPHIC STUDIES: I have personally reviewed the  radiological images as listed and agreed with the findings in the report. No results found.    No orders of the defined types were placed in this encounter.  All questions were answered. The patient knows to call the clinic with any problems, questions or concerns. No barriers to learning was detected. The total time spent in the appointment was 30 minutes, including review of chart and various tests results, discussions about plan of care and coordination of care plan     Onita Mattock, MD 09/02/2024

## 2024-09-03 ENCOUNTER — Other Ambulatory Visit: Payer: Self-pay

## 2024-09-04 ENCOUNTER — Other Ambulatory Visit: Payer: Self-pay

## 2024-09-05 ENCOUNTER — Other Ambulatory Visit: Payer: Self-pay

## 2024-09-05 NOTE — Progress Notes (Signed)
 Specialty Pharmacy Refill Coordination Note  [name] is a 68 y.o. female contacted today regarding refills of specialty medication(s) Abemaciclib  (VERZENIO )   Patient requested Delivery   Delivery date: 09/09/24   Verified address: [address] [facility] [place] [address]   Medication will be filled on: 09/08/24

## 2024-09-05 NOTE — Progress Notes (Signed)
 Specialty Pharmacy Ongoing Clinical Assessment Note  [name] is a 68 y.o. female who is being followed by the specialty pharmacy service for RxSp Oncology   Patient's specialty medication(s) reviewed today: Abemaciclib  (VERZENIO )   Missed doses in the last 4 weeks: 0   Patient/Caregiver did not have any additional questions or concerns.   Therapeutic benefit summary: Patient is achieving benefit   Adverse events/side effects summary: Experienced adverse events/side effects (significant diarrhea, nausea and fatigue - medication on hold until after Thanksgiving, then will start lower dose)   Patient's therapy is appropriate to: Continue    Goals Addressed             This Visit's Progress    Achieve a cure       Patient is unable to be assessed as therapy was recently initiated. Patient will maintain adherence         Follow up: 3 months  [facility] Specialty Pharmacist

## 2024-09-17 ENCOUNTER — Other Ambulatory Visit: Payer: Self-pay

## 2024-09-17 MED ORDER — ALBUTEROL SULFATE HFA 108 (90 BASE) MCG/ACT IN AERS: INHALATION_SPRAY | RESPIRATORY_TRACT | 2 refills | Status: AC

## 2024-09-19 LAB — SIGNATERA
SIGNATERA MTM READOUT: 0 MTM/ml
SIGNATERA TEST RESULT: NEGATIVE

## 2024-09-22 ENCOUNTER — Encounter (HOSPITAL_COMMUNITY): Payer: Self-pay

## 2024-09-22 ENCOUNTER — Other Ambulatory Visit: Payer: Self-pay

## 2024-09-22 DIAGNOSIS — I10 Essential (primary) hypertension: Secondary | ICD-10-CM

## 2024-09-22 MED ORDER — AMLODIPINE BESYLATE 10 MG PO TABS: 10.0000 mg | ORAL_TABLET | Freq: Every morning | ORAL | 2 refills | Status: DC

## 2024-09-22 MED ORDER — METOPROLOL TARTRATE 50 MG PO TABS: 50.0000 mg | ORAL_TABLET | Freq: Two times a day (BID) | ORAL | 2 refills | Status: AC

## 2024-09-29 ENCOUNTER — Other Ambulatory Visit: Payer: Self-pay

## 2024-09-29 ENCOUNTER — Other Ambulatory Visit: Payer: Self-pay | Admitting: Hematology

## 2024-09-29 ENCOUNTER — Other Ambulatory Visit (HOSPITAL_COMMUNITY): Payer: Self-pay

## 2024-09-29 MED ORDER — ABEMACICLIB 50 MG PO TABS
50.0000 mg | ORAL_TABLET | Freq: Two times a day (BID) | ORAL | 0 refills | Status: DC
  Filled 2024-09-29: qty 56, 28d supply, fill #0

## 2024-09-29 NOTE — Assessment & Plan Note (Deleted)
-  pT3N3aM0, stage IIIA, ER+/PR+/HER2- -diagnosed in 09/2023. Patient presented with a palpable left breast mass, ultrasound showed multiple enlarged left axillary lymph node. -Due to her underlying dementia, she underwent upfront surgery on 10/04/2023.  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 -I discussed the high risk of recurrence due to large number of positive lymph node, and the benefit of adjuvant chemotherapy, followed by adjuvant radiation, aromatase inhibitor and CDK4/6 inhibitor  -her staging CT and bone scan was negative for distant metastasis, except a 1cm left axillary node which is likely reactive to surgery, will f/u on CT in 6 months  -she started adjuvant chemo AC on 11/12/2023, and weekly Taxol  on 01/07/24. She completed chemo on 04/01/2024 -she completed adjuvant RT on 06/10/2024 -she started anastrozole  in 06/2024, I also recommend adjuvant Verzenio -due to her very high risk of recurrence, will monitor ctDNA testing every 3-6 months

## 2024-09-30 ENCOUNTER — Inpatient Hospital Stay: Admitting: Hematology

## 2024-09-30 ENCOUNTER — Other Ambulatory Visit: Payer: Self-pay

## 2024-09-30 ENCOUNTER — Other Ambulatory Visit (HOSPITAL_COMMUNITY): Payer: Self-pay

## 2024-09-30 ENCOUNTER — Inpatient Hospital Stay

## 2024-09-30 ENCOUNTER — Other Ambulatory Visit: Payer: Self-pay | Admitting: Hematology

## 2024-09-30 ENCOUNTER — Inpatient Hospital Stay: Attending: Hematology | Admitting: Nurse Practitioner

## 2024-09-30 ENCOUNTER — Encounter: Payer: Self-pay | Admitting: Nurse Practitioner

## 2024-09-30 VITALS — BP 138/80 | HR 70 | Temp 98.6°F | Resp 17 | Wt 185.9 lb

## 2024-09-30 DIAGNOSIS — F418 Other specified anxiety disorders: Secondary | ICD-10-CM | POA: Insufficient documentation

## 2024-09-30 DIAGNOSIS — Z7951 Long term (current) use of inhaled steroids: Secondary | ICD-10-CM | POA: Insufficient documentation

## 2024-09-30 DIAGNOSIS — Z803 Family history of malignant neoplasm of breast: Secondary | ICD-10-CM | POA: Insufficient documentation

## 2024-09-30 DIAGNOSIS — M8580 Other specified disorders of bone density and structure, unspecified site: Secondary | ICD-10-CM | POA: Diagnosis not present

## 2024-09-30 DIAGNOSIS — I10 Essential (primary) hypertension: Secondary | ICD-10-CM | POA: Insufficient documentation

## 2024-09-30 DIAGNOSIS — C50412 Malignant neoplasm of upper-outer quadrant of left female breast: Secondary | ICD-10-CM

## 2024-09-30 DIAGNOSIS — Z7982 Long term (current) use of aspirin: Secondary | ICD-10-CM | POA: Insufficient documentation

## 2024-09-30 DIAGNOSIS — Z170 Estrogen receptor positive status [ER+]: Secondary | ICD-10-CM | POA: Diagnosis present

## 2024-09-30 DIAGNOSIS — Z79811 Long term (current) use of aromatase inhibitors: Secondary | ICD-10-CM | POA: Diagnosis not present

## 2024-09-30 DIAGNOSIS — Z1732 Human epidermal growth factor receptor 2 negative status: Secondary | ICD-10-CM | POA: Diagnosis not present

## 2024-09-30 DIAGNOSIS — Z1211 Encounter for screening for malignant neoplasm of colon: Secondary | ICD-10-CM

## 2024-09-30 DIAGNOSIS — Z79899 Other long term (current) drug therapy: Secondary | ICD-10-CM | POA: Diagnosis not present

## 2024-09-30 DIAGNOSIS — Z885 Allergy status to narcotic agent status: Secondary | ICD-10-CM | POA: Insufficient documentation

## 2024-09-30 DIAGNOSIS — E785 Hyperlipidemia, unspecified: Secondary | ICD-10-CM | POA: Insufficient documentation

## 2024-09-30 DIAGNOSIS — M792 Neuralgia and neuritis, unspecified: Secondary | ICD-10-CM

## 2024-09-30 DIAGNOSIS — Z79624 Long term (current) use of inhibitors of nucleotide synthesis: Secondary | ICD-10-CM | POA: Insufficient documentation

## 2024-09-30 DIAGNOSIS — C50912 Malignant neoplasm of unspecified site of left female breast: Secondary | ICD-10-CM

## 2024-09-30 LAB — CBC WITH DIFFERENTIAL (CANCER CENTER ONLY)
Abs Immature Granulocytes: 0.01 K/uL (ref 0.00–0.07)
Basophils Absolute: 0 K/uL (ref 0.0–0.1)
Basophils Relative: 1 %
Eosinophils Absolute: 0.1 K/uL (ref 0.0–0.5)
Eosinophils Relative: 3 %
HCT: 29.3 % — ABNORMAL LOW (ref 36.0–46.0)
Hemoglobin: 9.5 g/dL — ABNORMAL LOW (ref 12.0–15.0)
Immature Granulocytes: 0 %
Lymphocytes Relative: 33 %
Lymphs Abs: 1.2 K/uL (ref 0.7–4.0)
MCH: 29.7 pg (ref 26.0–34.0)
MCHC: 32.4 g/dL (ref 30.0–36.0)
MCV: 91.6 fL (ref 80.0–100.0)
Monocytes Absolute: 0.3 K/uL (ref 0.1–1.0)
Monocytes Relative: 9 %
Neutro Abs: 1.9 K/uL (ref 1.7–7.7)
Neutrophils Relative %: 54 %
Platelet Count: 190 K/uL (ref 150–400)
RBC: 3.2 MIL/uL — ABNORMAL LOW (ref 3.87–5.11)
RDW: 14.2 % (ref 11.5–15.5)
WBC Count: 3.5 K/uL — ABNORMAL LOW (ref 4.0–10.5)
nRBC: 0 % (ref 0.0–0.2)

## 2024-09-30 LAB — CMP (CANCER CENTER ONLY)
ALT: 9 U/L (ref 0–44)
AST: 17 U/L (ref 15–41)
Albumin: 3.7 g/dL (ref 3.5–5.0)
Alkaline Phosphatase: 108 U/L (ref 38–126)
Anion gap: 8 (ref 5–15)
BUN: 7 mg/dL — ABNORMAL LOW (ref 8–23)
CO2: 24 mmol/L (ref 22–32)
Calcium: 9.8 mg/dL (ref 8.9–10.3)
Chloride: 112 mmol/L — ABNORMAL HIGH (ref 98–111)
Creatinine: 0.83 mg/dL (ref 0.44–1.00)
GFR, Estimated: >60 mL/min (ref >60)
Glucose, Bld: 103 mg/dL — ABNORMAL HIGH (ref 70–99)
Potassium: 3.3 mmol/L — ABNORMAL LOW (ref 3.5–5.1)
Sodium: 144 mmol/L (ref 135–145)
Total Bilirubin: 0.3 mg/dL (ref 0.0–1.2)
Total Protein: 6.1 g/dL — ABNORMAL LOW (ref 6.5–8.1)

## 2024-09-30 MED ORDER — POTASSIUM CHLORIDE CRYS ER 20 MEQ PO TBCR
20.0000 meq | EXTENDED_RELEASE_TABLET | Freq: Every day | ORAL | 0 refills | Status: AC
  Filled 2024-09-30 – 2024-10-05 (×2): qty 30, 30d supply, fill #0

## 2024-09-30 MED ORDER — ONDANSETRON HCL 8 MG PO TABS
8.0000 mg | ORAL_TABLET | Freq: Three times a day (TID) | ORAL | 0 refills | Status: AC | PRN
  Filled 2024-09-30 – 2024-10-05 (×2): qty 45, 15d supply, fill #0

## 2024-09-30 MED ORDER — ABEMACICLIB 50 MG PO TABS
50.0000 mg | ORAL_TABLET | Freq: Two times a day (BID) | ORAL | 1 refills | Status: AC
  Filled 2024-09-30 (×2): qty 56, 28d supply, fill #0
  Filled 2024-10-27: qty 56, 28d supply, fill #1

## 2024-09-30 NOTE — Progress Notes (Signed)
 CLINIC:  Survivorship   Patient Care Team: [name], MD as PCP - General (Family Medicine) [name], MD as PCP - Cardiology (Cardiology) [name], MD (Inactive) as Consulting Physician (Neurology) [name], MD as Consulting Physician (Hematology and Oncology)   REASON FOR VISIT:  Routine follow-up post-treatment for a recent history of breast cancer.  BRIEF ONCOLOGIC HISTORY:  Oncology History  Infiltrating ductal carcinoma of breast (HCC)  09/22/2023 Initial Diagnosis   Infiltrating ductal carcinoma of breast (HCC)   11/12/2023 - 03/24/2024 Chemotherapy   Patient is on Treatment Plan : BREAST DOSE DENSE AC q14d / PACLitaxel  q7d     Malignant neoplasm of upper-outer quadrant of left breast in female, estrogen receptor positive (HCC)  09/28/2023 Initial Diagnosis   Malignant neoplasm of upper-outer quadrant of left breast in female, estrogen receptor positive (HCC)   09/28/2023 Cancer Staging   Staging form: Breast, AJCC 8th Edition - Clinical stage from 09/28/2023: Stage IIA (cT2, cN1, cM0, G2, ER+, PR+, HER2-) - Signed by [name], MD on 09/28/2023 Histologic grading system: 3 grade system   10/04/2023 Cancer Staging   Staging form: Breast, AJCC 8th Edition - Pathologic stage from 10/04/2023: Stage IIIA (pT2, pN3a, cM0, G2, ER+, PR+, HER2-) - Signed by [name], MD on 10/19/2023 Histologic grading system: 3 grade system Residual tumor (R): R0 - None   10/31/2023 Imaging   NM whole body bone scan IMPRESSION: No evidence skeletal metastasis.   10/31/2023 Imaging   CT chest abdomen and pelvis with contrast  IMPRESSION: 1. Status post lumpectomy of the upper outer left breast and left axillary lymph node dissection with associated simple attenuation fluid collections, most consistent with postoperative seromas. 2. Enlarged left axillary lymph node measuring 1.0 x 0.8 cm, nonspecific and possibly reactive in the setting of recent surgery. Correlate with  tissue findings of lymph node dissection. Attention on follow-up. 3. No other evidence of lymphadenopathy or metastatic disease in the chest, abdomen, or pelvis. 4. Nonobstructive bilateral nephrolithiasis.   11/08/2023 Echocardiogram   Echocardiogram FINDINGS   Left Ventricle: Left ventricular ejection fraction, by estimation, is 55 to 60%. Left ventricular ejection fraction by 3D volume is 56 %. The left ventricle has normal function. The left ventricle has no regional wall motion abnormalities. Global longitudinal strain performed but not reported based on interpreter judgement due to suboptimal tracking. The left ventricular internal cavity size was mildly dilated. There is mild left ventricular hypertrophy. Left ventricular diastolic parameters are consistent with Grade I diastolic dysfunction (impaired relaxation). Indeterminate filling pressures.  Right Ventricle: The right ventricular size is normal. No increase in right ventricular wall thickness. Right ventricular systolic function is  low normal. There is normal pulmonary artery systolic pressure. The tricuspid regurgitant velocity is 2.12 m/s,  and with an assumed right atrial pressure of 3 mmHg, the estimated right ventricular systolic pressure is 21.0 mmHg.  Left Atrium: Left atrial size was normal in size.  Right Atrium: Right atrial size was normal in size.  Pericardium: There is no evidence of pericardial effusion.  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 No evidence of mitral valve stenosis.  Tricuspid Valve: The tricuspid valve is normal in structure. Tricuspid valve regurgitation is mild . No evidence of tricuspid stenosis.  Aortic Valve: The aortic valve is tricuspid. Aortic valve regurgitation is not visualized. No aortic stenosis is present.  Pulmonic Valve: The pulmonic valve was normal in structure. Pulmonic valve regurgitation is mild. No evidence  of pulmonic stenosis.  Aorta: The  aortic root and ascending aorta are structurally normal, with no evidence of dilitation.  Venous: The inferior vena cava is normal in size with greater than 50% respiratory variability, suggesting right atrial pressure of 3 mmHg.  IAS/Shunts: There is redundancy of the interatrial septum. Cannot exclude a small PFO.       INTERVAL HISTORY:  Ms. Butler presents to the Survivorship Clinic today for our initial meeting to review her survivorship care plan detailing her treatment course for breast cancer, as well as monitoring long-term side effects of that treatment, education regarding health maintenance, screening, and overall wellness and health promotion.     Overall, Ms. Prien reports feeling OK but not 100%. Tolerating lower dose verzenio  better, less fatigue/diarrhea but still nauseous. She is eating/drinking. Tolerating anastrozole , hot flashes are tolerable. Fingers feel cold/numb, gabapentin  helps. Denies bone pain, abdominal bloating/pain, unintentional weight loss or any other specific concerns.     REVIEW OF SYSTEMS:  Review of Systems - Oncology Breast: Denies any new nodularity, masses, tenderness, nipple changes, or nipple discharge.      ONCOLOGY TREATMENT TEAM:  1. Surgeon:  Dr. Vernetta at [facility] 2. Medical Oncologist: Dr. Lanny  3. Radiation Oncologist: Dr. Dewey    PAST MEDICAL/SURGICAL HISTORY:  Past Medical History:  Diagnosis Date   Allergy    Anxiety    Arthritis    degenerative in back and hips   Asthma    Atypical chest pain 05/03/2015   Low risk Myoview 2016, normal LVF by echo Dec 2020   Breast cancer (HCC) 10/04/2023   Carpal tunnel syndrome, bilateral    Chest pain    Depression    Elevated PTHrP level 06/19/2018   Syncope   Feeling grief 12/30/2015   Heart murmur    age 68 said had heart murmur.   Hyperlipidemia    Hyperparathyroidism 07/25/2019   Hypertension    Normal cardiac stress test    Myoview stress test   OAB  (overactive bladder) 09/01/2015   Osteopenia 05/2018   T score -1.1 FRAX 2.5%/ 0.1%   Subclinical hyperthyroidism 05/08/2007   Qualifier: Diagnosis of  By: Kennyth MD, Delon     Past Surgical History:  Procedure Laterality Date   BREAST BIOPSY Left 09/19/2023   x's 2   BREAST BIOPSY Left 09/19/2023   US  LT BREAST BX W LOC DEV 1ST LESION IMG BX SPEC US  GUIDE 09/19/2023 [facility]   BREAST BIOPSY Left 10/03/2023   US  LT RADIOACTIVE SEED LOC 10/03/2023 [facility]   BREAST BIOPSY Left 10/03/2023   US  LT RADIOACTIVE SEED EA ADD LESION 10/03/2023 [facility]   BREAST LUMPECTOMY WITH RADIOACTIVE SEED AND AXILLARY LYMPH NODE DISSECTION Left 10/04/2023   Procedure: LEFT BREAST LUMPECTOMY AND TARGETED LYMPH NODE DISSECTION;  Surgeon: Vernetta Berg, MD;  Location: [facility];  Service: General;  Laterality: Left;   COLONOSCOPY     IR IMAGING GUIDED PORT INSERTION  11/05/2023   POLYPECTOMY     TOTAL HIP ARTHROPLASTY Left 10/01/2013   DR ROWAN   TOTAL HIP ARTHROPLASTY Left 10/01/2013   Procedure: TOTAL HIP ARTHROPLASTY;  Surgeon: Dempsey JINNY Sensor, MD;  Location: [facility] OR;  Service: Orthopedics;  Laterality: Left;     ALLERGIES:  Allergies[1]   CURRENT MEDICATIONS:  Outpatient Encounter Medications as of 09/30/2024  Medication Sig   acyclovir  ointment (ZOVIRAX ) 5 % Apply topically 4 (four) times daily. For 7 days   albuterol  (VENTOLIN  HFA)  108 (90 Base) MCG/ACT inhaler USE 2 INHALATIONS BY MOUTH EVERY 6 HOURS AS NEEDED FOR WHEEZING  OR SHORTNESS OF BREATH   amLODipine  (NORVASC ) 10 MG tablet Take 1 tablet (10 mg total) by mouth every morning.   anastrozole  (ARIMIDEX ) 1 MG tablet Take 1 tablet (1 mg total) by mouth daily.   aspirin  EC 81 MG tablet Take 81 mg by mouth daily. Swallow whole.   Budesonide  90 MCG/ACT inhaler Inhale 1 puff into the lungs 2 (two) times daily.   capsaicin  (ZOSTRIX) 0.025 % cream Apply topically 2 (two) times daily.    cholecalciferol (VITAMIN D3) 25 MCG (1000 UNIT) tablet Take 1 tablet (1,000 Units total) by mouth daily.   Cholecalciferol (VITAMIN D3) 50 MCG (2000 UT) CHEW Chew 2,000 Units by mouth daily.   donepezil (ARICEPT) 10 MG tablet Take 10 mg by mouth at bedtime.   fluticasone  (FLONASE ) 50 MCG/ACT nasal spray USE 1 SPRAY IN BOTH NOSTRILS  DAILY (Patient taking differently: Place 1 spray into both nostrils daily as needed for allergies or rhinitis.)   fluticasone  (FLOVENT  HFA) 110 MCG/ACT inhaler Inhale 1 puff into the lungs daily as needed. (Patient taking differently: Inhale 1 puff into the lungs daily as needed (for flares).)   gabapentin  (NEURONTIN ) 300 MG capsule Take 1 capsule (300 mg total) by mouth 2 (two) times daily.   loratadine  (EQ ALLERGY RELIEF) 10 MG tablet Take 1 tablet (10 mg total) by mouth daily. (Patient taking differently: Take 10 mg by mouth daily as needed for allergies or rhinitis.)   losartan  (COZAAR ) 50 MG tablet Take 1 tablet (50 mg total) by mouth at bedtime.   metoprolol  tartrate (LOPRESSOR ) 50 MG tablet Take 1 tablet (50 mg total) by mouth 2 (two) times daily.   ondansetron  (ZOFRAN ) 8 MG tablet Take 1 tablet (8 mg total) by mouth every 8 (eight) hours as needed for nausea or vomiting.   rosuvastatin  (CRESTOR ) 5 MG tablet TAKE 1 TABLET BY MOUTH EVERY  MONDAY AND FRIDAY   sertraline  (ZOLOFT ) 50 MG tablet TAKE 1 TABLET BY MOUTH DAILY   traMADol  (ULTRAM ) 50 MG tablet Take 1 tablet (50 mg total) by mouth every 6 (six) hours as needed for moderate pain (pain score 4-6) or severe pain (pain score 7-10).   TYLENOL  500 MG tablet Take 500-1,000 mg by mouth every 6 (six) hours as needed for mild pain (pain score 1-3) or headache.   [DISCONTINUED] abemaciclib  (VERZENIO ) 100 MG tablet Take 1 tablet (100 mg total) by mouth 2 (two) times daily.   [DISCONTINUED] abemaciclib  (VERZENIO ) 50 MG tablet Take 1 tablet (50 mg total) by mouth 2 (two) times daily.   [DISCONTINUED] KLOR-CON  M20 20 MEQ  tablet Take 1 tablet by mouth twice daily   abemaciclib  (VERZENIO ) 50 MG tablet Take 1 tablet (50 mg total) by mouth 2 (two) times daily.   potassium chloride  SA (KLOR-CON  M20) 20 MEQ tablet Take 1 tablet (20 mEq total) by mouth daily.   No facility-administered encounter medications on file as of 09/30/2024.     ONCOLOGIC FAMILY HISTORY:  Family History  Adopted: Yes  Problem Relation Age of Onset   Hypertension Mother    Diabetes Father    Hyperlipidemia Brother    Breast cancer Paternal Grandmother 12 - 39   Heart attack Neg Hx    Sudden death Neg Hx    Colon cancer Neg Hx    Hypercalcemia Neg Hx      GENETIC COUNSELING/TESTING: No  SOCIAL HISTORY:  [name] is here with one of her daughters.  They are aware of their and screening recommendations. She denies any current or history of tobacco, alcohol, or illicit drug use.     PHYSICAL EXAMINATION:  Vital Signs:   Vitals:   09/30/24 0944  BP: 138/80  Pulse: 70  Resp: 17  Temp: 98.6 F (37 C)  SpO2: 99%   Filed Weights   09/30/24 0944  Weight: 185 lb 14.4 oz (84.3 kg)   General: Well-nourished, well-appearing female in no acute distress.   HEENT:  Sclerae anicteric.  Lymph: No cervical, supraclavicular, or infraclavicular lymphadenopathy noted on palpation.  Cardiovascular: Regular rate and rhythm. Respiratory: Clear; breathing non-labored.  GI: Abdomen soft and round; non-tender, non-distended. Bowel sounds normoactive.  Neuro: No focal deficits. Steady gait.  Psych: Mood and affect normal and appropriate for situation.  Extremities: No edema. MSK: No focal spinal tenderness to palpation.  Limited ROM at the left shoulder Skin: Warm and dry. Breast: No nipple discharge or inversion.  S/p left lumpectomy and radiation, incisions completely healed, breast is larger with evidence of lymphedema.  No palpable mass or nodularity in either breast or axilla that I could appreciate.  LABORATORY DATA:      Latest Ref Rng & Units 09/30/2024    9:18 AM 09/02/2024    9:01 AM 08/06/2024    9:40 AM  CBC  WBC 4.0 - 10.5 K/uL 3.5  3.2  4.5   Hemoglobin 12.0 - 15.0 g/dL 9.5  89.0  89.2   Hematocrit 36.0 - 46.0 % 29.3  34.0  33.5   Platelets 150 - 400 K/uL 190  147  216        Latest Ref Rng & Units 09/30/2024    9:18 AM 09/02/2024    9:01 AM 08/06/2024    9:40 AM  CMP  Glucose 70 - 99 mg/dL 896  98  882   BUN 8 - 23 mg/dL 7  15  9    Creatinine 0.44 - 1.00 mg/dL 9.16  8.84  9.27   Sodium 135 - 145 mmol/L 144  144  142   Potassium 3.5 - 5.1 mmol/L 3.3  3.6  3.6   Chloride 98 - 111 mmol/L 112  111  109   CO2 22 - 32 mmol/L 24  28  28    Calcium  8.9 - 10.3 mg/dL 9.8  89.0  89.1   Total Protein 6.5 - 8.1 g/dL 6.1  6.5  6.5   Total Bilirubin 0.0 - 1.2 mg/dL 0.3  0.4  0.3   Alkaline Phos 38 - 126 U/L 108  110  107   AST 15 - 41 U/L 17  11  13    ALT 0 - 44 U/L 9  8  10       DIAGNOSTIC IMAGING:  None for this visit.      ASSESSMENT AND PLAN:  Ms.. Zobel is a pleasant 68 y.o. 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  She presents to the Survivorship Clinic for our initial meeting and routine follow-up post-completion of treatment for breast cancer.    #. Stage III left breast cancer:  Ms. Mom is continuing to recover from definitive treatment for breast cancer.  Tolerating anastrozole  and lower dose Verzenio  better overall.  She will follow-up with her medical oncologist, Dr. Lanny in 6 weeks.  Recent Signatera was negative.  Today's exam is benign except lymphedema, I will refer her to PT.  She is overdue for mammogram, order placed today.  Otherwise continue surveillance.   Today, a comprehensive survivorship care plan and treatment summary was reviewed with the patient today detailing her  breast cancer diagnosis, treatment course, potential late/long-term effects of treatment, appropriate follow-up care with recommendations for the future, and patient education resources.  A copy of this summary, along with a letter will be sent to the patients primary care provider via mail/fax/In Basket message after todays visit.    #. Bone health:  Given Ms. Ide's age/history of breast cancer and her current treatment regimen including anti-estrogen therapy with anastrozole , she is at risk for bone demineralization.  Her last DEXA scan was 06/11/2018 which showed osteopenia, low FRAX score.  Scheduled for repeat DEXA 12/04/2024.  Encouraged daily calcium  and vitamin D  as well as weightbearing exercise.    #. Cancer screening:  Due to Ms. Ojala's history and her age, she should receive screening for skin cancers, colon cancer, and gynecologic cancers.  She is overdue for colonoscopy, I will CC my note to Dr. Aneita.  The information and recommendations are listed on the patient's comprehensive care plan/treatment summary and were reviewed in detail with the patient.    #. Health maintenance and wellness promotion: Ms. Barrington was encouraged to consume 5-7 servings of fruits and vegetables per day.   She was also encouraged to engage in moderate to vigorous exercise for 30 minutes per day most days of the week. We discussed the [employer] fitness program, which is designed for cancer survivors to help them become more physically fit after cancer treatments.  She was instructed to limit her alcohol consumption and continue to abstain from tobacco use.     #. Support services/counseling: It is not uncommon for this period of the patient's cancer care trajectory to be one of many emotions and stressors.  We discussed an opportunity for her to participate in the next session of FYNN (Finding Your New Normal) support group series designed for patients after they have completed treatment.   Ms. Aybar  was encouraged to take advantage of our many other support services programs, support groups, and/or counseling in coping with her new life as a cancer survivor after completing anti-cancer treatment.  She was offered support today through active listening and expressive supportive counseling.  She was given information regarding our available services and encouraged to contact me with any questions or for help enrolling in any of our support group/programs.    Dispo:   -Recent signatera (negative) and today's labs reviewed; continue surveillance  -Continue daily anastrozole  and Verzenio  50 mg twice daily, refilled -Restart potassium p.o. once daily, refilled  -Reviewed symptom management, new Rx: Zofran  PRN -Lab, flush, f/up in 6 weeks  -Mammogram due now, order placed  -Referral to PT/LE clinic -Referral back to GI for colonoscopy (last 2021 per Dr. Aneita 3 year recall) -Follow up with surgery as indicated   She is welcome to return back to the Survivorship Clinic at any time; no additional follow-up needed at this time. Consider referral back to survivorship as a long-term survivor for continued surveillance  Orders Placed This Encounter  Procedures   MM DIAG BREAST TOMO BILATERAL    Standing Status:   Future    Expected Date:   10/07/2024    Expiration Date:   09/30/2025    Reason for Exam (SYMPTOM  OR DIAGNOSIS  REQUIRED):   L breast cancer 09/2023    [facility]erred imaging [facility]   Ambulatory referral to Physical Therapy    Referral Priority:   Routine    Referral Type:   Physical Medicine    Referral Reason:   Specialty Services Required    Requested Specialty:   Physical Therapy    Number of Visits Requested:   1   Ambulatory referral to Gastroenterology    Referral Priority:   Routine    Referral Type:   Consultation    Referral Reason:   Specialty Services Required    Number of Visits Requested:   1    A total of (40) minutes of face-to-face time was  spent with this patient with greater than 50% of that time in counseling and care-coordination.   Reily Ilic, NP Survivorship Program [facility] [phone]   Note: PRIMARY CARE PROVIDER [name], MD [phone]      [1]  Allergies Allergen Reactions   Emend [Aprepitant] Anaphylaxis    After receiving approximately 1cc of Emend, pt began c/o chest tightness and headache. Medication discontinued permanently.    Hydrochlorothiazide      Stopped after syncopal spell- unclear if it was related  SYNCOPE   Hydrocodone  Other (See Comments)    Mood disturbance   Simvastatin Other (See Comments)    Muscle aches, GI complaints

## 2024-10-02 ENCOUNTER — Encounter

## 2024-10-02 ENCOUNTER — Other Ambulatory Visit (HOSPITAL_COMMUNITY): Payer: Self-pay

## 2024-10-05 ENCOUNTER — Other Ambulatory Visit (HOSPITAL_COMMUNITY): Payer: Self-pay

## 2024-10-06 ENCOUNTER — Other Ambulatory Visit (HOSPITAL_COMMUNITY): Payer: Self-pay

## 2024-10-06 ENCOUNTER — Other Ambulatory Visit: Payer: Self-pay

## 2024-10-06 NOTE — Progress Notes (Signed)
 Specialty Pharmacy Refill Coordination Note  [name] is a 68 y.o. female contacted today regarding refills of specialty medication(s) Abemaciclib  (VERZENIO )   Patient requested Delivery   Delivery date: 10/07/24   Verified address: [address] [place] [address]   Medication will be filled on: 10/06/24

## 2024-10-20 ENCOUNTER — Encounter

## 2024-10-20 ENCOUNTER — Encounter: Payer: Self-pay | Admitting: Hematology

## 2024-10-20 DIAGNOSIS — Z170 Estrogen receptor positive status [ER+]: Secondary | ICD-10-CM

## 2024-10-27 ENCOUNTER — Encounter: Payer: Self-pay | Admitting: Hematology

## 2024-10-27 ENCOUNTER — Other Ambulatory Visit (HOSPITAL_COMMUNITY): Payer: Self-pay

## 2024-10-29 ENCOUNTER — Other Ambulatory Visit: Payer: Self-pay

## 2024-10-29 ENCOUNTER — Encounter: Payer: Self-pay | Admitting: Family Medicine

## 2024-10-29 ENCOUNTER — Other Ambulatory Visit: Payer: Self-pay | Admitting: Pharmacy Technician

## 2024-10-29 NOTE — Progress Notes (Signed)
 Specialty Pharmacy Refill Coordination Note  [name] is a 69 y.o. female contacted today regarding refills of specialty medication(s) Abemaciclib  (VERZENIO )  Spoke with Daughter  Patient requested Delivery   Delivery date: 11/04/24   Verified address: [address] [place]   Medication will be filled on: 11/03/24

## 2024-11-01 ENCOUNTER — Encounter: Payer: Self-pay | Admitting: Hematology

## 2024-11-03 ENCOUNTER — Other Ambulatory Visit: Payer: Self-pay

## 2024-11-05 ENCOUNTER — Ambulatory Visit: Payer: Medicare (Managed Care) | Admitting: Family Medicine

## 2024-11-05 VITALS — BP 130/78 | HR 82 | Ht 62.0 in | Wt 185.2 lb

## 2024-11-05 DIAGNOSIS — F02B0 Dementia in other diseases classified elsewhere, moderate, without behavioral disturbance, psychotic disturbance, mood disturbance, and anxiety: Secondary | ICD-10-CM

## 2024-11-05 DIAGNOSIS — F39 Unspecified mood [affective] disorder: Secondary | ICD-10-CM

## 2024-11-05 DIAGNOSIS — G301 Alzheimer's disease with late onset: Secondary | ICD-10-CM | POA: Diagnosis not present

## 2024-11-05 NOTE — Patient Instructions (Addendum)
 It was wonderful to see you today.  Please bring ALL of your medications with you to every visit.    VISIT SUMMARY: Today, we discussed your increasing forgetfulness and the need for additional help at home. Your daughter is concerned about your ability to manage daily activities, especially as she plans to return to work soon. We also reviewed your mood and current medications.  YOUR PLAN: -MODERATE LATE ONSET ALZHEIMER'S DEMENTIA: Alzheimer's dementia is a condition that affects memory and cognitive function. Your recent memory test scores indicate moderate impairment. We have referred you to a home health nurse aide to assist with medications and meals. We have also coordinated with your insurance to approve these home health services. Please continue your follow-up appointments with Dr. Marko Rudd at the memory center next month.  INSTRUCTIONS: Please continue your follow-up appointments with Dr. Marko Rudd at the memory center next month.  Contains text generated by [employer].   Thank you for choosing [facility].   Please call [phone] with any questions about today's appointment.  Areta Saliva, MD  Family Medicine

## 2024-11-05 NOTE — Progress Notes (Unsigned)
" ° °  SUBJECTIVE:   CHIEF COMPLAINT / HPI:  Discussed the use of AI scribe software for clinical note transcription with the patient, who gave verbal consent to proceed.  History of Present Illness [name] is a 69 year old female with dementia who presents for evaluation of home health assistance needs.  Dementia  - Increasing forgetfulness, including forgetting conversations shortly after she occurs - Requires frequent reminders for daily tasks - No longer able to safely manage driving or finances independently - Cognitive score at last memory center visit in June 2025 was 18/30, decreased from 20/30 in 2024 -She is a patient of [facility] memory care  Functional decline - Needs assistance with basic daily activities such as help with meals otherwise patient will not remember to eat.  Additionally needs help with medications - Daughter concerned about ability to manage activities of daily living, especially as she plans to return to work and will have less availability for supervision  Mood and psychiatric symptoms - Mood described as good and normal by both patient and daughter - Takes Zoloft  with good effect - No current therapy - Patient had not been taking tramadol  requested be taken off the list    PERTINENT  PMH / PSH: Dementia  OBJECTIVE:  BP 130/78   Pulse 82   Ht 5' 2 (1.575 m)   Wt 185 lb 3.2 oz (84 kg)   SpO2 100%   BMI 33.87 kg/m   General: well appearing, in no acute distress Resp: Normal work of breathing on room air Neuro: Alert & Oriented x 4, cognitively slowed sometimes unable to answer questions  ASSESSMENT/PLAN:   Assessment & Plan 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 Symptoms include forgetfulness and difficulty with activities of daily living. Alzheimer's suspected based on type of dementia.  Is following with  a memory specialist - Referred to social work for [facility] to assist with getting a home health aide for patient with help for ADLs. - Continue follow-up with Dr. Marko Rudd at the memory center next month. Mood disorder Well-controlled mood.  Patient has history of depressed mood and anxiety. - Continue Zoloft     Areta Saliva, MD [facility] "

## 2024-11-06 NOTE — Assessment & Plan Note (Addendum)
 Memory impairment with MMSE scores of 18/30. Symptoms include forgetfulness and difficulty with activities of daily living. Alzheimer's suspected based on type of dementia.  Is following with a memory specialist - Referred to social work for [facility] to assist with getting a home health aide for patient with help for ADLs. - Continue follow-up with Dr. Marko Rudd at the memory center next month.

## 2024-11-10 ENCOUNTER — Telehealth: Payer: Self-pay | Admitting: Pharmacist

## 2024-11-10 ENCOUNTER — Inpatient Hospital Stay: Payer: Self-pay | Admitting: Hematology

## 2024-11-10 ENCOUNTER — Inpatient Hospital Stay: Payer: Self-pay

## 2024-11-10 NOTE — Progress Notes (Signed)
 Contacted pt regarding after hours call. Spoke with pt's daughter. Pt's daughter stated pt had c/o of sore throat this am but was with her sister at this time. Advised pt's daughter to have pt see PCP if throat pain continues. Pt's daughter acknowledged information and verbalized understanding. Agreed to call PCP as needed.

## 2024-11-10 NOTE — Telephone Encounter (Signed)
 This patient is appearing on a report for being at risk of failing the adherence measure for hypertension (ACEi/ARB) medications this calendar year.   Medication: losartan  Last fill date: 07/16/2024 for 90 day supply  Reviewed medication indication, dosing, and goals of therapy.    Medication: amlodipine  Last fill date: 05/12/2024 for 100 day supply   Reviewed medication indication, dosing, and goals of therapy.   Daughter contacted, she fills pill box.  Reports adherence and adequate supply of losartan .  However, did not recognize amlodipine  as current medication.  She plans to contact her doctor to determine need for this medication.   Last Blood Pressure was in target range.

## 2024-11-11 NOTE — Telephone Encounter (Signed)
 Called patient's daughter. Patient has not been taking amlodipine  and patient has risk of falling. BP is controlled without it. I told daughter we can stop this medication and take it off her list. She agreed.

## 2024-11-11 NOTE — Addendum Note (Signed)
 Addended by: Latoria Dry on: 11/11/2024 12:27 PM   Modules accepted: Orders

## 2024-11-11 NOTE — Telephone Encounter (Signed)
 Reviewed and agree with Dr Rennis plan.

## 2024-11-12 ENCOUNTER — Telehealth: Payer: Self-pay | Admitting: *Deleted

## 2024-11-12 NOTE — Progress Notes (Signed)
 Complex Care Management Note  Care Guide Note 11/12/2024 Name: [name] MRN: [id number] DOB: Feb 17, 1956  [name] is a 69 y.o. year old female who sees [name], MD for primary care. I reached out to Lolita LITTIE Mae by phone today to offer complex care management services.  [name] daughter [name] [facility] on file  was given information about Complex Care Management services today including:   The Complex Care Management services include support from the care team which includes your Nurse Care Manager, Clinical Social Worker, or Pharmacist.  The Complex Care Management team is here to help remove barriers to the health concerns and goals most important to you. Complex Care Management services are voluntary, and the patient may decline or stop services at any time by request to their care team member.   Complex Care Management Consent Status: Patient agreed to services and verbal consent obtained.  Daughter agreed [name]   Follow up plan:  Telephone appointment with complex care management team member scheduled for:  11/21/24  Encounter Outcome:  Patient Scheduled  [name]  [facility]  [facility], [facility] Guide  Direct Dial: [phone]  Fax [phone]

## 2024-11-21 ENCOUNTER — Other Ambulatory Visit: Payer: Medicare (Managed Care) | Admitting: Licensed Clinical Social Worker

## 2024-11-21 ENCOUNTER — Other Ambulatory Visit: Payer: Self-pay

## 2024-11-21 NOTE — Patient Outreach (Incomplete)
 Complex Care Management   Visit Note  11/21/2024  Name:  [name] MRN: [id number] DOB: 1955-12-18  Situation: Referral received for Complex Care Management related to Dementia I obtained verbal consent from patients daughter Alfonso.  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   Hyperlipidemia    Hyperparathyroidism 07/25/2019   Hypertension    Normal cardiac stress test    Myoview stress test   OAB (overactive bladder) 09/01/2015   Osteopenia 05/2018   T score -1.1 FRAX 2.5%/ 0.1%   Subclinical hyperthyroidism 05/08/2007   Qualifier: Diagnosis of  By: Kennyth MD, Delon      Assessment: LCSW met with patient daughter Alfonso on the phone.  Patient Reported Symptoms:  Cognitive Cognitive Status: Unable to Assess (Patient was not present) Cognitive/Intellectual Conditions Management [RPT]: None reported or documented in medical history or problem list   Health Maintenance Behaviors: Annual physical exam, Social activities Healing Pattern: Average Health Facilitated by: Healthy diet, Prayer/meditation  Neurological Neurological Review of Symptoms: No symptoms reported    HEENT HEENT Symptoms Reported: No symptoms reported      Cardiovascular Cardiovascular Symptoms Reported: No symptoms reported    Respiratory Respiratory Symptoms Reported: No symptoms reported    Endocrine Endocrine Symptoms Reported: No symptoms reported Is patient diabetic?: No    Gastrointestinal Gastrointestinal Symptoms Reported: No symptoms reported       Genitourinary Genitourinary Symptoms Reported: No symptoms reported    Integumentary Integumentary Symptoms Reported: No symptoms reported    Musculoskeletal Musculoskelatal Symptoms Reviewed: Unsteady gait Musculoskeletal Management Strategies: Activity, Routine screening Falls in the past year?: No    Psychosocial Psychosocial Symptoms Reported: Anxiety - if selected complete GAD Behavioral Management Strategies: Coping strategies, Medication therapy Behavioral Health Self-Management Outcome: 4 (good) Major Change/Loss/Stressor/Fears (CP): Denies Quality of Family Relationships: supportive, involved, helpful Do you feel physically threatened by others?: No    11/21/2024    PHQ2-9 Depression Screening   Little interest or pleasure in doing things    Feeling down, depressed, or hopeless    PHQ-2 - Total Score    Trouble falling or staying asleep, or sleeping too much    Feeling tired or having little energy    Poor appetite or overeating     Feeling bad about yourself - or that you are a failure or have let yourself or your family down    Trouble concentrating on things, such as reading the newspaper or watching television    Moving or speaking so slowly that other people could have noticed.  Or the opposite - being so fidgety or restless that you have been moving around a lot more than usual    Thoughts that you would be better off dead, or hurting yourself in some way    PHQ2-9 Total Score    If you checked off any problems, how difficult have these problems made it for you to do your  work, take care of things at home, or get along with other people    Depression Interventions/Treatment      There were no vitals filed for this visit.    Medications Reviewed Today     Reviewed by Veva Bolt, LCSW (Social Worker) on 11/21/24 at 0915  Med List Status: <None>   Medication Order Taking? Sig Documenting Provider Last Dose Status Informant  abemaciclib  (VERZENIO ) 50 MG tablet  [id number]  Take 1 tablet (50 mg total) by mouth 2 (two) times daily. Burton, Lacie K, NP  Active   acyclovir  ointment (ZOVIRAX ) 5 % [id number]  Apply topically 4 (four) times daily. For 7 days [name], MD  Active   albuterol  (VENTOLIN  HFA) 108 (90 Base) MCG/ACT inhaler [id number]  USE 2 INHALATIONS BY MOUTH EVERY 6 HOURS AS NEEDED FOR WHEEZING  OR SHORTNESS OF SHERIDA [name], MD  Active   anastrozole  (ARIMIDEX ) 1 MG tablet [id number]  TAKE 1 TABLET BY MOUTH DAILY Burton, Lacie K, NP  Active   aspirin  EC 81 MG tablet [id number]  Take 81 mg by mouth daily. Swallow whole. [provider]  Active   Budesonide  90 MCG/ACT inhaler [id number]  Inhale 1 puff into the lungs 2 (two) times daily. Brimage, Vondra, DO  Active Family Member  capsaicin  (ZOSTRIX) 0.025 % cream [id number]  Apply topically 2 (two) times daily. Riccio, Angela C, DO  Active Family Member  cholecalciferol (VITAMIN D3) 25 MCG (1000 UNIT) tablet [id number]  Take 1 tablet (1,000 Units total) by mouth daily. Espinoza, Alejandra, DO  Active Family Member  Cholecalciferol (VITAMIN D3) 50 MCG (2000 UT) CHEW [id number]  Chew 2,000 Units by mouth daily. [provider]  Active Family Member  donepezil (ARICEPT) 10 MG tablet [id number]  Take 10 mg by mouth at bedtime. [provider]  Active Family Member  fluticasone  (FLONASE ) 50 MCG/ACT nasal spray [id number]  USE 1 SPRAY IN BOTH NOSTRILS  DAILY  Patient taking differently: Place 1 spray into both nostrils daily as needed for allergies or rhinitis.   [name], DO  Active Family Member  fluticasone  (FLOVENT  HFA) 110 MCG/ACT inhaler [id number]  Inhale 1 puff into the lungs daily as needed.  Patient taking differently: Inhale 1 puff into the lungs daily as needed (for flares).   Brimage, Vondra, DO  Active Family Member  gabapentin  (NEURONTIN ) 300 MG capsule [id number]  TAKE 1 CAPSULE BY MOUTH TWICE  DAILY Burton, Lacie K, NP  Active   loratadine  (EQ ALLERGY  RELIEF) 10 MG tablet [id number]  Take 1 tablet (10 mg total) by mouth daily.  Patient taking differently: Take 10 mg by mouth daily as needed for allergies or rhinitis.   Shirley, Jordan, DO  Active Family Member  losartan  (COZAAR ) 50 MG tablet [id number]  Take 1 tablet (50 mg total) by mouth at bedtime. [name], DO  Active   metoprolol  tartrate (LOPRESSOR ) 50 MG tablet [id number]  Take 1 tablet (50 mg total) by mouth 2 (two) times daily. [name], MD  Active   ondansetron  (ZOFRAN ) 8 MG tablet [id number]  Take 1 tablet (8 mg total) by mouth every 8 (eight) hours as needed for nausea or vomiting. Burton, Lacie K, NP  Active   potassium chloride  SA (KLOR-CON  M20) 20 MEQ tablet [id number]  Take 1 tablet (20 mEq total) by mouth daily. Burton, Lacie K, NP  Active   rosuvastatin  (CRESTOR ) 5 MG tablet [id number]  TAKE 1 TABLET BY MOUTH EVERY  MONDAY AND FRIDAY [name],  MD  Active   sertraline  (ZOLOFT ) 50 MG tablet [id number]  TAKE 1 TABLET BY MOUTH DAILY [name], DO  Active   TYLENOL  500 MG tablet [id number]  Take 500-1,000 mg by mouth every 6 (six) hours as needed for mild pain (pain score 1-3) or headache. [provider]  Active Family Member  Med List Note Carmella Asberry FALCON [facility] 08/07/24 1317): Verzenio  filled through [facility]             Recommendation:   PCP Follow-up Continue Current Plan of Care  Follow Up Plan:   Telephone follow up appointment date/time:  12/05/24 at 9am.  Cena Ligas, LCSW Clinical Social Worker [facility]

## 2024-11-25 ENCOUNTER — Inpatient Hospital Stay: Payer: Self-pay | Attending: Hematology

## 2024-11-25 ENCOUNTER — Inpatient Hospital Stay: Payer: Self-pay | Admitting: Hematology

## 2024-12-04 ENCOUNTER — Other Ambulatory Visit (HOSPITAL_BASED_OUTPATIENT_CLINIC_OR_DEPARTMENT_OTHER)

## 2024-12-05 ENCOUNTER — Telehealth: Payer: Medicare (Managed Care) | Admitting: Licensed Clinical Social Worker

## 2024-12-08 ENCOUNTER — Encounter
